# Patient Record
Sex: Male | Born: 1960 | Race: White | Hispanic: No | Marital: Married | Smoking: Former smoker
Health system: Southern US, Community
[De-identification: ages and names within clinical notes are randomized; demographics above are authoritative.]

## PROBLEM LIST (undated history)

## (undated) DIAGNOSIS — F419 Anxiety disorder, unspecified: Secondary | ICD-10-CM

## (undated) DIAGNOSIS — F319 Bipolar disorder, unspecified: Secondary | ICD-10-CM

## (undated) DIAGNOSIS — M545 Low back pain, unspecified: Secondary | ICD-10-CM

## (undated) DIAGNOSIS — J449 Chronic obstructive pulmonary disease, unspecified: Secondary | ICD-10-CM

## (undated) DIAGNOSIS — R7309 Other abnormal glucose: Secondary | ICD-10-CM

## (undated) DIAGNOSIS — M1990 Unspecified osteoarthritis, unspecified site: Secondary | ICD-10-CM

## (undated) DIAGNOSIS — E119 Type 2 diabetes mellitus without complications: Secondary | ICD-10-CM

## (undated) DIAGNOSIS — E785 Hyperlipidemia, unspecified: Secondary | ICD-10-CM

## (undated) DIAGNOSIS — A400 Sepsis due to streptococcus, group A: Secondary | ICD-10-CM

## (undated) DIAGNOSIS — S32009A Unspecified fracture of unspecified lumbar vertebra, initial encounter for closed fracture: Secondary | ICD-10-CM

## (undated) DIAGNOSIS — K219 Gastro-esophageal reflux disease without esophagitis: Secondary | ICD-10-CM

## (undated) DIAGNOSIS — E559 Vitamin D deficiency, unspecified: Secondary | ICD-10-CM

## (undated) DIAGNOSIS — F22 Delusional disorders: Secondary | ICD-10-CM

## (undated) DIAGNOSIS — Z720 Tobacco use: Secondary | ICD-10-CM

## (undated) DIAGNOSIS — Z87442 Personal history of urinary calculi: Secondary | ICD-10-CM

## (undated) DIAGNOSIS — E538 Deficiency of other specified B group vitamins: Secondary | ICD-10-CM

## (undated) DIAGNOSIS — E0590 Thyrotoxicosis, unspecified without thyrotoxic crisis or storm: Secondary | ICD-10-CM

## (undated) DIAGNOSIS — J45909 Unspecified asthma, uncomplicated: Secondary | ICD-10-CM

## (undated) DIAGNOSIS — I10 Essential (primary) hypertension: Secondary | ICD-10-CM

## (undated) DIAGNOSIS — M797 Fibromyalgia: Secondary | ICD-10-CM

## (undated) DIAGNOSIS — J189 Pneumonia, unspecified organism: Secondary | ICD-10-CM

## (undated) HISTORY — PX: TONSILLECTOMY: SUR1361

## (undated) HISTORY — DX: Deficiency of other specified B group vitamins: E53.8

## (undated) HISTORY — DX: Gastro-esophageal reflux disease without esophagitis: K21.9

## (undated) HISTORY — PX: COLONOSCOPY: SHX174

## (undated) HISTORY — DX: Unspecified asthma, uncomplicated: J45.909

## (undated) HISTORY — PX: UMBILICAL HERNIA REPAIR: SHX2598

## (undated) HISTORY — DX: Type 2 diabetes mellitus without complications: E11.9

## (undated) HISTORY — DX: Delusional disorders: F22

## (undated) HISTORY — PX: NOSE SURGERY: SHX723

## (undated) HISTORY — DX: Low back pain: M54.5

## (undated) HISTORY — PX: BACK SURGERY: SHX140

## (undated) HISTORY — DX: Vitamin D deficiency, unspecified: E55.9

## (undated) HISTORY — DX: Hyperlipidemia, unspecified: E78.5

## (undated) HISTORY — DX: Fibromyalgia: M79.7

## (undated) HISTORY — DX: Low back pain, unspecified: M54.50

## (undated) HISTORY — DX: Chronic obstructive pulmonary disease, unspecified: J44.9

## (undated) HISTORY — PX: APPENDECTOMY: SHX54

## (undated) HISTORY — DX: Other abnormal glucose: R73.09

## (undated) HISTORY — PX: CATARACT EXTRACTION: SUR2

## (undated) HISTORY — DX: Sepsis due to Streptococcus, group A: A40.0

## (undated) HISTORY — PX: INGUINAL HERNIA REPAIR: SUR1180

## (undated) HISTORY — DX: Tobacco use: Z72.0

## (undated) HISTORY — DX: Essential (primary) hypertension: I10

## (undated) HISTORY — DX: Bipolar disorder, unspecified: F31.9

---
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
## 1989-08-10 DIAGNOSIS — F319 Bipolar disorder, unspecified: Secondary | ICD-10-CM

## 1989-08-10 HISTORY — DX: Bipolar disorder, unspecified: F31.9

## 2002-07-09 ENCOUNTER — Emergency Department (HOSPITAL_COMMUNITY): Admission: EM | Admit: 2002-07-09 | Discharge: 2002-07-09 | Payer: Self-pay | Admitting: Emergency Medicine

## 2002-10-15 ENCOUNTER — Emergency Department (HOSPITAL_COMMUNITY): Admission: EM | Admit: 2002-10-15 | Discharge: 2002-10-15 | Payer: Self-pay | Admitting: Emergency Medicine

## 2003-07-26 ENCOUNTER — Emergency Department (HOSPITAL_COMMUNITY): Admission: EM | Admit: 2003-07-26 | Discharge: 2003-07-27 | Payer: Self-pay | Admitting: Emergency Medicine

## 2005-01-06 ENCOUNTER — Emergency Department (HOSPITAL_COMMUNITY): Admission: EM | Admit: 2005-01-06 | Discharge: 2005-01-06 | Payer: Self-pay | Admitting: Family Medicine

## 2008-02-19 ENCOUNTER — Emergency Department (HOSPITAL_COMMUNITY): Admission: EM | Admit: 2008-02-19 | Discharge: 2008-02-20 | Payer: Self-pay | Admitting: Emergency Medicine

## 2008-04-05 ENCOUNTER — Encounter: Admission: RE | Admit: 2008-04-05 | Discharge: 2008-04-05 | Payer: Self-pay | Admitting: Orthopedic Surgery

## 2011-04-16 ENCOUNTER — Encounter: Payer: Self-pay | Admitting: Family Medicine

## 2011-04-16 ENCOUNTER — Other Ambulatory Visit: Payer: Self-pay | Admitting: Family Medicine

## 2011-04-17 ENCOUNTER — Encounter: Payer: Self-pay | Admitting: Family Medicine

## 2011-04-17 ENCOUNTER — Ambulatory Visit (INDEPENDENT_AMBULATORY_CARE_PROVIDER_SITE_OTHER): Payer: Self-pay | Admitting: Family Medicine

## 2011-04-17 ENCOUNTER — Other Ambulatory Visit: Payer: Self-pay | Admitting: Family Medicine

## 2011-04-17 DIAGNOSIS — F319 Bipolar disorder, unspecified: Secondary | ICD-10-CM

## 2011-04-17 DIAGNOSIS — I10 Essential (primary) hypertension: Secondary | ICD-10-CM | POA: Insufficient documentation

## 2011-04-17 DIAGNOSIS — F17200 Nicotine dependence, unspecified, uncomplicated: Secondary | ICD-10-CM

## 2011-04-17 DIAGNOSIS — Z720 Tobacco use: Secondary | ICD-10-CM

## 2011-04-17 DIAGNOSIS — R634 Abnormal weight loss: Secondary | ICD-10-CM

## 2011-04-17 DIAGNOSIS — Z87891 Personal history of nicotine dependence: Secondary | ICD-10-CM | POA: Insufficient documentation

## 2011-04-17 NOTE — Assessment & Plan Note (Signed)
Lost 15 pounds in last 2-3 months. Will obtain outside records to confirm weight loss. If true weight loss and continues to lose weight at next visit (1-2 months) then will consider cxr. Will obtain outside records to see if there is a previous cx to compare. Will also need a colonoscopy within the next year.

## 2011-04-17 NOTE — Assessment & Plan Note (Signed)
Encouraged cessation. Praised previous attempts to quit. Patient now up to 2 packs per day.

## 2011-04-17 NOTE — Assessment & Plan Note (Signed)
Moderately well controlled. No red flags. Encouraged increased exercise/lifestyle changes-exercise 4-5x/wk. Will not change lisinopril at this time although it can affect lithium levels. Will change only with discussions with psychiatrist as can change lithium levels.   3. Tobacco Abuse-has attempted to quit multiple times in the past but always goes back  Into smoking when he has a manic episode. Has tried wellbutrin and patches.   4. Chest pain-patient describes chest pain once a month lasting a minute over his left chest. Has been evaluated in the past and told it was a muscle spasm. Events can happen at rest and frequently do. No SOB, Nausea, vomiting, or sweating during these episodes.   5. Weight Loss-lost 15 pounds unintentionally over last 2 to 3 months. We do not have any records concerning whether he has a CXR but we will attempt to obtain records.

## 2011-04-17 NOTE — Patient Instructions (Signed)
It was great to meet you today. I would like to see you back in 1-2 months after we have obtained your medical records. Continue to follow your weight. Continue to try to quit smoking. Increase exercise to 4-5 times per week. Walking with your wife is a wonderful idea.

## 2011-04-17 NOTE — Assessment & Plan Note (Signed)
Will defer management to psychiatrist. Will not discontinue lisinopril on this time as appears to have been on for a while. Will defer management to psychiatrist as changing today would alter Lithium level.

## 2011-04-17 NOTE — Progress Notes (Signed)
  Subjective:    Patient ID: Jesus Martin, male    DOB: 01-Jul-1961, 50 y.o.   MRN: [id number]  HPI Patient is a 51 y/o M with a history of Bipolar Type I, tobacco abuse, and HTN presenting to establish care.   1. Psych-Patient is followed by Dr. Toy Care, psychiatrist. He is on multiple medications including Lamictal, Saphris, Tegretol, and Lithium. Despite this he had a manic episode last 3 days approximately a month ago. His psychiatrist is handling all of his psych meds.   2. HTN-blood pressure slightly elevated today but not in treatment range. Patient denies edema, SOB, HA.   3. Tobacco Abuse-has attempted to quit multiple times in the past but always goes back  Into smoking when he has a manic episode. Has tried wellbutrin and patches.   4. Chest pain-patient describes chest pain once a month lasting a minute over his left chest. Has been evaluated in the past and told it was a muscle spasm. Events can happen at rest and frequently do. No SOB, Nausea, vomiting, or sweating during these episodes.   5. Weight Loss-lost 15 pounds unintentionally over last 2 to 3 months. We do not have any records concerning whether he has a CXR but we will attempt to obtain records.   Past medical, social, family history-reviewed (see history tab) Review of Systems- see HPI     Objective:   Physical Exam  Constitutional: He is oriented to person, place, and time. He appears well-developed.  HENT:  Head: Normocephalic and atraumatic.  Nose: Nose normal.  Mouth/Throat: Oropharynx is clear and moist.  Eyes: Conjunctivae and EOM are normal. Pupils are equal, round, and reactive to light.  Neck: Normal range of motion. Neck supple.  Cardiovascular: Normal rate, regular rhythm, normal heart sounds and intact distal pulses.  Exam reveals no gallop and no friction rub.   No murmur heard. Pulmonary/Chest: Effort normal and breath sounds normal. No respiratory distress. He has no wheezes. He has no rales. He  exhibits no tenderness.  Abdominal: Soft. Bowel sounds are normal. He exhibits no distension. There is no tenderness. There is no rebound and no guarding.  Musculoskeletal: Normal range of motion.  Neurological: He is alert and oriented to person, place, and time. He has normal reflexes.  Skin: Skin is warm and dry.  Psychiatric: He has a normal mood and affect. His behavior is normal.          Assessment & Plan:

## 2011-06-24 ENCOUNTER — Encounter: Payer: Self-pay | Admitting: Family Medicine

## 2011-06-24 ENCOUNTER — Ambulatory Visit (HOSPITAL_COMMUNITY)
Admission: RE | Admit: 2011-06-24 | Discharge: 2011-06-24 | Disposition: A | Discharge status: Home / Self Care | Payer: Self-pay | Source: Ambulatory Visit | Attending: Family Medicine | Admitting: Family Medicine

## 2011-06-24 ENCOUNTER — Ambulatory Visit (INDEPENDENT_AMBULATORY_CARE_PROVIDER_SITE_OTHER): Payer: Self-pay | Admitting: Family Medicine

## 2011-06-24 VITALS — BP 123/72 | HR 91 | Temp 98.0°F | Ht 72.0 in | Wt 227.8 lb

## 2011-06-24 DIAGNOSIS — I10 Essential (primary) hypertension: Secondary | ICD-10-CM

## 2011-06-24 DIAGNOSIS — J984 Other disorders of lung: Secondary | ICD-10-CM | POA: Insufficient documentation

## 2011-06-24 DIAGNOSIS — R634 Abnormal weight loss: Secondary | ICD-10-CM | POA: Insufficient documentation

## 2011-06-24 DIAGNOSIS — J069 Acute upper respiratory infection, unspecified: Secondary | ICD-10-CM

## 2011-06-24 DIAGNOSIS — Z23 Encounter for immunization: Secondary | ICD-10-CM

## 2011-06-24 DIAGNOSIS — Z720 Tobacco use: Secondary | ICD-10-CM

## 2011-06-24 DIAGNOSIS — F17200 Nicotine dependence, unspecified, uncomplicated: Secondary | ICD-10-CM

## 2011-06-24 LAB — POCT URINALYSIS DIPSTICK
Bilirubin, UA: NEGATIVE
Blood, UA: NEGATIVE
Glucose, UA: NEGATIVE
Ketones, UA: NEGATIVE
Leukocytes, UA: NEGATIVE
Nitrite, UA: NEGATIVE
Protein, UA: NEGATIVE
Spec Grav, UA: 1.02
Urobilinogen, UA: 0.2
pH, UA: 7

## 2011-06-24 LAB — COMPREHENSIVE METABOLIC PANEL
ALT: 14 U/L (ref 0–53)
AST: 18 U/L (ref 0–37)
Albumin: 4.8 g/dL (ref 3.5–5.2)
Alkaline Phosphatase: 78 U/L (ref 39–117)
BUN: 9 mg/dL (ref 6–23)
CO2: 29 mEq/L (ref 19–32)
Calcium: 9.8 mg/dL (ref 8.4–10.5)
Chloride: 102 mEq/L (ref 96–112)
Creat: 0.88 mg/dL (ref 0.50–1.35)
Glucose, Bld: 83 mg/dL (ref 70–99)
Potassium: 4.1 mEq/L (ref 3.5–5.3)
Sodium: 139 mEq/L (ref 135–145)
Total Bilirubin: 0.5 mg/dL (ref 0.3–1.2)
Total Protein: 7.1 g/dL (ref 6.0–8.3)

## 2011-06-24 LAB — CBC
HCT: 45.4 % (ref 39.0–52.0)
Hemoglobin: 15.7 g/dL (ref 13.0–17.0)
MCH: 33.4 pg (ref 26.0–34.0)
MCHC: 34.6 g/dL (ref 30.0–36.0)
MCV: 96.6 fL (ref 78.0–100.0)
Platelets: 238 10*3/uL (ref 150–400)
RBC: 4.7 MIL/uL (ref 4.22–5.81)
RDW: 11.5 % (ref 11.5–15.5)
WBC: 10.7 10*3/uL — ABNORMAL HIGH (ref 4.0–10.5)

## 2011-06-24 LAB — PSA: PSA: 0.49 ng/mL (ref <=4.00)

## 2011-06-24 LAB — TSH: TSH: 2.443 u[IU]/mL (ref 0.350–4.500)

## 2011-06-24 NOTE — Progress Notes (Signed)
  Subjective:    Patient ID: Jesus Martin, male    DOB: 1961/08/07, 50 y.o.   MRN: [id number]  HPI Patient is a 50 y/o M with a history of Bipolar Type I, tobacco abuse, and HTN presenting for concerns of weight loss/cancer and for a dry cough for 1-2 weeks.   1. Weight loss-Patient reports a total of 40lbs weight loss in 5 months. He has lost 7 lbs since his visit 2 months ago. He is not intentionally trying to lose weight although he does walk with his grand kids. He is very fearful that this is cancer.  ROS-positive for urinary urgency, hesitancy, and dribbling. Says had a speck of redish brown when urinating earlier with some pain. He says he has been "burning up" without fevers for 4-5 months and has also had looser stools during this time. He is a long term smoker currently at 2 packs per day. Although fearful of cancer, not currently ready to quit even with knowledge that this is the #1 thing he could do to prevent cancer. Interested in a colonoscopy and labs.   2. Tobacco Abuse-has attempted to quit multiple times in the past but always goes back Into smoking when he has a manic episode. Has tried wellbutrin and patches. See problem #1. Says would like to think about it "after the holidays".   3. URI symptoms for 1-2 weeks with low grade temperatures at home to 99.9.  Past medical, social, family history-reviewed (see history tab)    Review of Systemsnegative except as noted in HPI       Objective:   Physical Exam BP 123/72  Pulse 91  Temp(Src) 98 F (36.7 C) (Oral)  Ht 6' (1.829 m)  Wt 103.329 kg (227 lb 12.8 oz)  BMI 30.90 kg/m2 Constitutional: He is oriented to person, place, and time. He appears well-developed.  Nose: Mild Rhinorrhea noted. Mouth/Throat: Oropharynx is clear and moist.  Eyes: Conjunctivae and EOM are normal. Pupils are equal, round, and reactive to light.  Neck: Normal range of motion. Neck supple.  Cardiovascular: Normal rate, regular rhythm, normal  heart sounds and intact distal pulses. Exam reveals no gallop and no friction rub.  No murmur heard.  Pulmonary/Chest: Effort normal and breath sounds normal. No respiratory distress. He has no wheezes. He has no rales. He exhibits no tenderness.  Abdominal: Soft. Bowel sounds are normal. He exhibits no distension. There is no tenderness. There is no rebound and no guarding.      Assessment & Plan:

## 2011-06-24 NOTE — Patient Instructions (Signed)
It was good to see you again today. I know you are worried about your weight loss. We are going to do several tests to try to get to the bottom of why you are losing weight.   1. I would like for you to get a chest x-ray today at the hospital. Just go to the front help desk for assistance in finding radiology.   2. I am going to do several lab tests. I would like to see you back in 3-4 weeks to talk about the results. I will call you sooner than that if there is something we need to talk about.   3. You received a flu shot and tetanus shot today.   4. When you are ready, I want you to make an appointment to meet with our pharmacist Dr. Valentina Lucks for help quitting smoking. You can make that appointment today if you would like.   See you soon, Dr. Yong Channel

## 2011-06-26 DIAGNOSIS — J069 Acute upper respiratory infection, unspecified: Secondary | ICD-10-CM | POA: Insufficient documentation

## 2011-06-26 NOTE — Assessment & Plan Note (Signed)
Well controlled. Will continue current therapy. Encouraged continued walking.

## 2011-06-26 NOTE — Assessment & Plan Note (Addendum)
Checked CBC(given fatigue in last few months), BMET, TSH(given feeling hot, diarrhea), PSA(given new urinary symptoms), and UA. Labs wnl except minimally elevated wbc count (suspect secondary to URI). CXR ordered which showed changes from chronic smoking. Although not read by radiology, slightly more prominent markings in RUL. Given long term history of smoking-will obtain CT chest with contrast. If negative, will schedule patient for colonoscopy.   Patient very worried about cancer but not ready to quit smoking at this time. Still need to see records from [facility] (asked to obtain records again-faxed last time without results). Other possibility-patient has increased activity and is losing weight as a result. No decreased appetite/PO intake from patient.   Follow up in 1 month or sooner depending on results of CT.

## 2011-06-26 NOTE — Assessment & Plan Note (Signed)
Told patient that cold could last for several weeks and is particularly hard to clear in smokers. Red flags discussed.

## 2011-06-26 NOTE — Assessment & Plan Note (Signed)
Spoke with about meeting with Dr. Valentina Lucks. Patient says would like to consider after the holidays. Not ready to quit at this time.

## 2011-07-03 ENCOUNTER — Ambulatory Visit (HOSPITAL_COMMUNITY)
Admission: RE | Admit: 2011-07-03 | Discharge: 2011-07-03 | Disposition: A | Discharge status: Home / Self Care | Payer: Self-pay | Source: Ambulatory Visit | Attending: Family Medicine | Admitting: Family Medicine

## 2011-07-03 DIAGNOSIS — R634 Abnormal weight loss: Secondary | ICD-10-CM | POA: Insufficient documentation

## 2011-07-03 DIAGNOSIS — F17200 Nicotine dependence, unspecified, uncomplicated: Secondary | ICD-10-CM | POA: Insufficient documentation

## 2011-07-03 MED ORDER — IOHEXOL 300 MG/ML  SOLN
100.0000 mL | Freq: Once | INTRAMUSCULAR | Status: AC | PRN
  Administered 2011-07-03: 100 mL via INTRAVENOUS

## 2011-07-10 ENCOUNTER — Encounter: Payer: Self-pay | Admitting: Family Medicine

## 2011-07-17 ENCOUNTER — Encounter: Payer: Self-pay | Admitting: Family Medicine

## 2011-07-17 ENCOUNTER — Ambulatory Visit (INDEPENDENT_AMBULATORY_CARE_PROVIDER_SITE_OTHER): Payer: Self-pay | Admitting: Family Medicine

## 2011-07-17 VITALS — BP 111/73 | HR 76 | Temp 98.3°F | Ht 72.0 in | Wt 228.0 lb

## 2011-07-17 DIAGNOSIS — Z0000 Encounter for general adult medical examination without abnormal findings: Secondary | ICD-10-CM

## 2011-07-17 DIAGNOSIS — F17200 Nicotine dependence, unspecified, uncomplicated: Secondary | ICD-10-CM

## 2011-07-17 DIAGNOSIS — Z720 Tobacco use: Secondary | ICD-10-CM

## 2011-07-17 DIAGNOSIS — R634 Abnormal weight loss: Secondary | ICD-10-CM

## 2011-07-17 DIAGNOSIS — J069 Acute upper respiratory infection, unspecified: Secondary | ICD-10-CM

## 2011-07-17 DIAGNOSIS — I10 Essential (primary) hypertension: Secondary | ICD-10-CM

## 2011-07-17 NOTE — Assessment & Plan Note (Signed)
Weight stable. Will get colonoscopy because patient close to 51 but not because of weight loss. Will continue to monitor. Patient told to come in 1 year or sooner if new concerns.

## 2011-07-17 NOTE — Progress Notes (Signed)
  Subjective:    Patient ID: Jesus Martin, male    DOB: 1960-08-19, 50 y.o.   MRN: [id number]  HPI  Patient is a 50 y/o M with a history of Bipolar Type I, tobacco abuse, and HTN presenting for concerns of weight loss/cancer and sore throat x 1 day.   1. Weight loss-weight up 1 lb per patient since last visit. Reviewed CT scan results which did not show malignancy. He feels much better now that he has not continued to lose weight.  2. Tobacco Abuse-has attempted to quit multiple times in the past but always goes back Into smoking when he has a manic episode. Has tried wellbutrin and patches. See problem #1. Says would like to think about it "after the holidays".   Although still afraid of cancer, not ready/willing to quit smoking yet.   3. Sore throat-reports some congestion (improved overall from last visit). No fevers/chills/myalgias/headaches.     Review of Systems negative except as noted in HPI       Objective:   Physical Exam  BP 111/73  Pulse 76  Temp(Src) 98.3 F (36.8 C) (Oral)  Ht 6' (1.829 m)  Wt 228 lb (103.42 kg)  BMI 30.92 kg/m2 Constitutional: He is oriented to person, place, and time. He appears well-developed.  Mouth/Throat: Oropharynx is clear and moist. Mild erythema of pharynx without pus or exudate.  Neck: Normal range of motion. Neck supple. No lymphadenopathy Cardiovascular: Normal rate, regular rhythm, normal heart sounds and intact distal pulses. Exam reveals no gallop and no friction rub.  No murmur heard.  Pulmonary/Chest: Effort normal and breath sounds normal. No respiratory distress. He has no wheezes. He has no rales. He exhibits no tenderness.  Extremities-no edema. Moves x4.  Neuro: alert and oriented x3.      Assessment & Plan:

## 2011-07-17 NOTE — Assessment & Plan Note (Signed)
Well controlled on current meds. Encouraged continued lifestyle changes. Reminded that weight loss from exercise/proper food choices is healthy.

## 2011-07-17 NOTE — Assessment & Plan Note (Signed)
New onset since last visit. Slight sore throat. Todl time is best remedy. Also advised of other symptomatic treatment.

## 2011-07-17 NOTE — Patient Instructions (Signed)
It was good to see you again today, Jesus Martin,   To review: 1. Your CT scan was normal except for chronic smoking changes.  2. I want you to continue to consider quitting smoking. When you are ready, please let us know. We have a number of resources to help.  3. I am going to try to get you set up for a colonoscopy through a referral.  4. I am glad you have not had any more drastic weight loss. It is reasonable for you to eat a balanced diet though and continue to exercise. If you lose weight this way, that is a good thing.   Please come back in 1 year or sooner as needed,  Dr. Yong Channel  P.S. We will keep an eye out for your records coming in.

## 2011-07-17 NOTE — Assessment & Plan Note (Signed)
Not ready to quit. Discussed options like Dr. Valentina Lucks and Dr. Gwenlyn Saran. Patient aware that we will address at every visit.

## 2011-09-07 ENCOUNTER — Encounter: Payer: Self-pay | Admitting: Family Medicine

## 2011-09-07 ENCOUNTER — Ambulatory Visit (INDEPENDENT_AMBULATORY_CARE_PROVIDER_SITE_OTHER): Payer: Self-pay | Admitting: Family Medicine

## 2011-09-07 VITALS — BP 118/90 | HR 84 | Temp 97.7°F | Ht 72.0 in | Wt 225.6 lb

## 2011-09-07 DIAGNOSIS — Z720 Tobacco use: Secondary | ICD-10-CM

## 2011-09-07 DIAGNOSIS — R634 Abnormal weight loss: Secondary | ICD-10-CM

## 2011-09-07 DIAGNOSIS — I10 Essential (primary) hypertension: Secondary | ICD-10-CM

## 2011-09-07 DIAGNOSIS — F319 Bipolar disorder, unspecified: Secondary | ICD-10-CM

## 2011-09-07 DIAGNOSIS — F17200 Nicotine dependence, unspecified, uncomplicated: Secondary | ICD-10-CM

## 2011-09-07 DIAGNOSIS — Z79899 Other long term (current) drug therapy: Secondary | ICD-10-CM

## 2011-09-07 LAB — POCT GLYCOSYLATED HEMOGLOBIN (HGB A1C): Hemoglobin A1C: 5.4

## 2011-09-07 LAB — LDL CHOLESTEROL, DIRECT: Direct LDL: 98 mg/dL

## 2011-09-07 NOTE — Patient Instructions (Signed)
To review: 1. Congratulations on your desire to quit smoking. I know you have quit before and I believe in you that you can do it again.  2. We are going to help you out with some resources  A. Call 1-800-quit-now and they may be able to help you with nicotine replacement  B. I want you to get an appointment at the front to meet with Dr. Valentina Lucks about quitting smoking 3. Until you meet with Dr. Valentina Lucks, I want you to do the following:  A. Try deep breathing for 10 seconds when you get upset to try to avoid smoking  B. When you get bored, try to do some woodworking since boredom seems to trigger your smoking.   C. If at all possible, try to cut down to 1 and 1/2 pack of cigarettes per day.  4. I am going to get some lab work today to check your cholesterol and to see if you are at risk for diabetes.  5. We are still trying to get you set up for a colonoscopy. Unfortunately, there are not any doctors accepting patient's without insurance at this time. We will let you know if this changes.   Thanks, Dr. Yong Channel

## 2011-09-07 NOTE — Assessment & Plan Note (Signed)
Discussed triggers and goals with patient. Patient to set up appointment with Dr. Valentina Lucks. Patient will attempt to cut down 1 1/2 PPD until sees Dr. Valentina Lucks by using deep breathing and trying woodworking while bored. Please see AVS for additional information.

## 2011-09-07 NOTE — Assessment & Plan Note (Signed)
Weight stable today only down 2 lbs from early November. . Patient less concerned about this issue. Still trying to have patient set up for colonoscopy, but no GI MD accepting indigent at this time.

## 2011-09-07 NOTE — Assessment & Plan Note (Signed)
Defer to psychiatry. Patient appears to be well controlled at this time.

## 2011-09-07 NOTE — Assessment & Plan Note (Addendum)
Well controlled. No current side effects. Continue current medications.   As patient is smoker and hypertensive, will get LDL and a1c to risk stratify. Likely would benefit from statin.

## 2011-09-07 NOTE — Progress Notes (Signed)
  Subjective:    Patient ID: Jesus Martin, male    DOB: September 19, 1960, 51 y.o.   MRN: [id number]  HPI Patient is a 51 y/o M with a history of Bipolar Type I, tobacco abuse, and HTN presenting with desire to quit smoking!  1. Tobacco Abuse-has attempted to quit multiple times in the past but always goes back Into smoking when he has a manic episode. Has tried wellbutrin and patches. He says nothing seems to work. He is frustrated that he hasn't quit. He says he used to be able to walk 5 miles and can only walk about a mile now. He smokes [employer] of [employer]. Says he wants to be around for his grandchildren  Triggers-getting angry which he says happens a lot because he is bipolar, boredom.  Goals-wants to be able to walk further without being SOB. Wants to be around for grandchildren.   2. HTN-denies CP (except for once monthly sharp pain that can happen at rest resolved within a minute), HA, SOB (except per problem #1 related to smoking), blurry vision, LE edema, orthopnea (says only sleeps on side), PND. Well controlled on current medications without side effects.   3. Weight loss-patietn seen previously for this issue. Weight stable today. Only down 2 lbs from early November. Patient not concerned as previous about smoking but knows quitting smoking could reduce risk of cancer.   Review of Systems negative except as noted in HPI       Objective:   Physical Exam  Vitals reviewed. Constitutional: He is oriented to person, place, and time. He appears well-developed and well-nourished.  HENT:  Head: Normocephalic and atraumatic.  Mouth/Throat: Oropharynx is clear and moist. No oropharyngeal exudate.  Neck: Normal range of motion. Neck supple. No thyromegaly present.  Cardiovascular: Normal rate and regular rhythm.  Exam reveals no gallop and no friction rub.   No murmur heard. Pulmonary/Chest: Effort normal and breath sounds normal. No respiratory distress. He has no wheezes. He has no rales.   Abdominal: Soft. Bowel sounds are normal. He exhibits no distension. There is no tenderness.  Musculoskeletal: Normal range of motion. He exhibits no edema.  Neurological: He is alert and oriented to person, place, and time.          Assessment & Plan:

## 2011-09-10 ENCOUNTER — Encounter: Payer: Self-pay | Admitting: Family Medicine

## 2011-09-15 ENCOUNTER — Encounter: Payer: Self-pay | Admitting: Pharmacist

## 2011-09-15 ENCOUNTER — Ambulatory Visit (INDEPENDENT_AMBULATORY_CARE_PROVIDER_SITE_OTHER): Payer: Self-pay | Admitting: Pharmacist

## 2011-09-15 VITALS — BP 125/83 | HR 75 | Ht 72.0 in | Wt 226.0 lb

## 2011-09-15 DIAGNOSIS — Z720 Tobacco use: Secondary | ICD-10-CM

## 2011-09-15 DIAGNOSIS — F17200 Nicotine dependence, unspecified, uncomplicated: Secondary | ICD-10-CM

## 2011-09-15 NOTE — Progress Notes (Signed)
  Subjective:    Patient ID: Jesus Martin, male    DOB: 1961/02/18, 51 y.o.   MRN: [id number]  HPI  Patient arrives with depressed mood accompanied by his 64 year old  granddaughter Jesus Martin.  He states he is interested in quitting cold [place].   He has attempted to quit in the past without significant success.   Number of Cigarettes per day 40. Brand smoked [employer]. Estimated Nicotine Content per Cigarette (mg) 1.2.  Estimated Nicotine intake per day >60m.   Smokes first cigarette < 5 minutes after waking.  Longest time ever been tobacco free 3 months.  What Medications (NRT, bupropion, varenicline) used in past includes Bupropion, patch and nicotine gum.    Rates IMPORTANCE of quitting tobacco on 1-10 scale of 8 . Rates READINESS of quitting tobacco on 1-10 scale of 4. Rates CONFIDENCE of quitting tobacco on 1-10 scale of 8. Triggers to use tobacco include; Boredom, HABIT - depressed mood     Review of Systems     Objective:   Physical Exam        Assessment & Plan:  Severe  Nicotine Dependence of many years duration in a patient who is fair candidate for success b/c of current level of motivation BUT struggles with mental illness.   Considering ECT for depressed symptoms.  Return to clinic on Friday to initiate nicotine replacement tx in combination.  Written information provided. F/U Rx Clinic Visit 09/18/2011.   Total time in face-to-face counseling 25 minutes.

## 2011-09-15 NOTE — Assessment & Plan Note (Signed)
Severe  Nicotine Dependence of many years duration in a patient who is fair candidate for success b/c of current level of motivation BUT struggles with mental illness.   Considering ECT for depressed symptoms.  Return to clinic on Friday to initiate nicotine replacement tx in combination.  Written information provided. F/U Rx Clinic Visit 09/18/2011.   Total time in face-to-face counseling 25 minutes.

## 2011-09-15 NOTE — Patient Instructions (Addendum)
Follow Up with pharmacist clinic.   Friday AM at 10AM

## 2011-09-15 NOTE — Progress Notes (Signed)
  Subjective:    Patient ID: Jesus Martin, male    DOB: 1961/07/05, 51 y.o.   MRN: [id number]  HPI Reviewed and agree with Dr. Graylin Shiver management.    Review of Systems     Objective:   Physical Exam        Assessment & Plan:

## 2011-09-18 ENCOUNTER — Encounter: Payer: Self-pay | Admitting: Pharmacist

## 2011-09-18 ENCOUNTER — Ambulatory Visit (INDEPENDENT_AMBULATORY_CARE_PROVIDER_SITE_OTHER): Payer: Self-pay | Admitting: Pharmacist

## 2011-09-18 DIAGNOSIS — F17200 Nicotine dependence, unspecified, uncomplicated: Secondary | ICD-10-CM

## 2011-09-18 DIAGNOSIS — Z720 Tobacco use: Secondary | ICD-10-CM

## 2011-09-18 MED ORDER — NICOTINE 21 MG/24HR TD PT24: 1.0000 | MEDICATED_PATCH | TRANSDERMAL | Status: DC

## 2011-09-18 NOTE — Progress Notes (Signed)
  Subjective:    Patient ID: Jesus Martin, male    DOB: 11-27-1960, 51 y.o.   MRN: [id number]  HPI  Patient arrives with depressed mood accompanied by his 3 year old granddaughter Abigail. He is interested in quitting smoking.  He has attempted to quit in the past without significant success.  Number of Cigarettes per day 40.  Brand smoked [employer]. Estimated Nicotine Content per Cigarette (mg) 1.2.  Estimated Nicotine intake per day >28m.  Smokes first cigarette < 5 minutes after waking.  Longest time ever been tobacco free 3 months.  What Medications (NRT, bupropion, varenicline) used in past includes Bupropion, patch and nicotine gum.  Rates IMPORTANCE of quitting tobacco on 1-10 scale of 8 .  Rates READINESS of quitting tobacco on 1-10 scale of 4.  Rates CONFIDENCE of quitting tobacco on 1-10 scale of 8.  Triggers to use tobacco include; Boredom, HABIT - depressed mood  He and his wife have NOT yet made any plans RE possible ECT therapy for his depressed mood.      Review of Systems     Objective:   Physical Exam        Assessment & Plan:  severe Nicotine Dependence of longstanding duration in a patient who is fair candidate for success b/c of current level of motivation.  His current depressed mood and potential ECT Tx may impact his success.  Initiated combination nicotine replacement tx with 2683m patch (insurance to cover) and 83m70mozenge (patient will purchase). Patient counseled on purpose, proper use, and potential adverse effects, including   Written information provided.  F/U Rx Clinic Visit in one week.   Total time in face-to-face counseling 60 minutes.  Patient seen with KriWoodroe Chenharmacy Resident.

## 2011-09-18 NOTE — Progress Notes (Signed)
  Subjective:    Patient ID: Jesus Martin, male    DOB: [birth year], 51 y.o.   MRN: [id number]  HPI  Reviewed and agree with Dr. Graylin Shiver management.     Review of Systems     Objective:   Physical Exam        Assessment & Plan:

## 2011-09-18 NOTE — Assessment & Plan Note (Signed)
severe Nicotine Dependence of longstanding duration in a patient who is fair candidate for success b/c of current level of motivation.  His current depressed mood and potential ECT Tx may impact his success.  Initiated combination nicotine replacement tx with 60m  patch (insurance to cover) and 454mlozenge (patient will purchase). Patient counseled on purpose, proper use, and potential adverse effects, including   Written information provided.  F/U Rx Clinic Visit in one week.   Total time in face-to-face counseling 60 minutes.  Patient seen with KrWoodroe ChenPharmacy Resident.

## 2011-09-18 NOTE — Patient Instructions (Signed)
Quit Date will be 09/21/2011  Think about things that can keep you busy on Monday! Try to avoid you after lunch cigarette for the next few days - it will make it easier on your quit date.  Use a 21 mg patch daily  Buy 39m gum and start 8-10 pieces per day on your quit date.

## 2011-09-25 ENCOUNTER — Ambulatory Visit (INDEPENDENT_AMBULATORY_CARE_PROVIDER_SITE_OTHER): Payer: Self-pay | Admitting: Pharmacist

## 2011-09-25 ENCOUNTER — Encounter: Payer: Self-pay | Admitting: Pharmacist

## 2011-09-25 DIAGNOSIS — Z720 Tobacco use: Secondary | ICD-10-CM

## 2011-09-25 DIAGNOSIS — F17200 Nicotine dependence, unspecified, uncomplicated: Secondary | ICD-10-CM
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
## 2011-09-25 NOTE — Progress Notes (Signed)
  Subjective:    Patient ID: Jesus Martin, male    DOB: May 04, 1961, 51 y.o.   MRN: [id number]  HPI Reviewed and agree with Dr. Graylin Shiver management    Review of Systems     Objective:   Physical Exam        Assessment & Plan:

## 2011-09-25 NOTE — Patient Instructions (Signed)
Congratulations on Quitting Smoking on 09/21/2011 at 11:00 AM!  Continue to use the 21 mg patch AND the 72m nicotine gum for the next few weeks.

## 2011-09-25 NOTE — Assessment & Plan Note (Signed)
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   Continue nicotine replacement tx of gum and patch at this time. Patient counseled on purpose, proper use.   Patient denies significant adverse effects of tx. Written information provided. P F/U Rx Clinic Visit 3/8   Total time in face-to-face counseling 35 minutes.

## 2011-10-16 ENCOUNTER — Ambulatory Visit (INDEPENDENT_AMBULATORY_CARE_PROVIDER_SITE_OTHER): Payer: Self-pay | Admitting: Pharmacist

## 2011-10-16 ENCOUNTER — Encounter: Payer: Self-pay | Admitting: Pharmacist

## 2011-10-16 VITALS — BP 141/89 | HR 79 | Ht 72.0 in | Wt 232.0 lb

## 2011-10-16 DIAGNOSIS — F17200 Nicotine dependence, unspecified, uncomplicated: Secondary | ICD-10-CM

## 2011-10-16 DIAGNOSIS — Z720 Tobacco use: Secondary | ICD-10-CM

## 2011-10-16 MED ORDER — NICOTINE 14 MG/24HR TD PT24: 1.0000 | MEDICATED_PATCH | TRANSDERMAL | Status: DC

## 2011-10-16 NOTE — Progress Notes (Signed)
  Subjective:    Patient ID: Jesus Martin, male    DOB: Apr 20, 1961, 51 y.o.   MRN: [id number]  HPI Patient arrived in good spirits. He reports not using cigarettes x 3 weeks - using 21 mg nicotine patch and 4 mg nicotine gum approximately 8x per day.  His biggest challenge is when friends visit that smoke, and he allows them to smoke outside of the house. He copes with this by chewing nicotine gum. He expresses desire to taper down on patch.    Review of Systems     Objective:   Physical Exam        Assessment & Plan:  severe Nicotine Dependence of many years duration in a patient who is good candidate for success b/c of motivation for success and x 3 weeks.   Patient denies any adverse effects of patch or gum.   Continue nicotine replacement tx. Taper nicotine patch to 14 mg, continue 4 mg gum up to 8 pieces per day. Patient counseled on purpose, proper use, and potential adverse effects, including.  F/U clinic visit in 3 weeks on 3/28.   Total time in face-to-face counseling 15 minutes.  Patient seen with Liane Comber, PharmD Candidate and Martinique Smith, Pharmacy Resident.

## 2011-10-16 NOTE — Patient Instructions (Addendum)
1. Change nicotine patch to 14 mg per patch 2. Continue to use same use of nicotine gum  3. Return for follow up appointment March 28th at 8:30 am

## 2011-10-16 NOTE — Assessment & Plan Note (Signed)
severe Nicotine Dependence of many years duration in a patient who is good candidate for success b/c of motivation for success and x 3 weeks.   Patient denies any adverse effects of patch or gum.   Continue nicotine replacement tx. Taper nicotine patch to 14 mg, continue 4 mg gum up to 8 pieces per day. Patient counseled on purpose, proper use, and potential adverse effects, including.  F/U clinic visit in 3 weeks on 3/28.   Total time in face-to-face counseling 15 minutes.  Patient seen with Liane Comber, PharmD Candidate and Martinique Smith, Pharmacy Resident.

## 2011-10-16 NOTE — Progress Notes (Signed)
  Subjective:    Patient ID: Jesus Martin, male    DOB: [birth year], 51 y.o.   MRN: [id number]  HPI  Reviewed agree with Dr. Graylin Shiver management.    Review of Systems     Objective:   Physical Exam        Assessment & Plan:

## 2011-11-05 ENCOUNTER — Ambulatory Visit: Payer: Self-pay | Admitting: Pharmacist

## 2011-11-23 ENCOUNTER — Telehealth: Payer: Self-pay | Admitting: Pharmacist

## 2011-11-23 ENCOUNTER — Ambulatory Visit: Payer: Self-pay | Admitting: Pharmacist

## 2011-11-23 NOTE — Telephone Encounter (Signed)
Patient not at home.  Person answering phone will have him reschedule in MAY.    No change suggested at this time.

## 2013-04-23 ENCOUNTER — Emergency Department (HOSPITAL_COMMUNITY)
Admission: EM | Admit: 2013-04-23 | Discharge: 2013-04-23 | Disposition: A | Discharge status: Home / Self Care | Payer: Medicare HMO | Attending: Emergency Medicine | Admitting: Emergency Medicine

## 2013-04-23 ENCOUNTER — Encounter (HOSPITAL_COMMUNITY): Payer: Self-pay | Admitting: Family Medicine

## 2013-04-23 DIAGNOSIS — Z79899 Other long term (current) drug therapy: Secondary | ICD-10-CM | POA: Insufficient documentation

## 2013-04-23 DIAGNOSIS — Z87891 Personal history of nicotine dependence: Secondary | ICD-10-CM | POA: Insufficient documentation

## 2013-04-23 DIAGNOSIS — I10 Essential (primary) hypertension: Secondary | ICD-10-CM | POA: Insufficient documentation

## 2013-04-23 DIAGNOSIS — K089 Disorder of teeth and supporting structures, unspecified: Secondary | ICD-10-CM | POA: Insufficient documentation

## 2013-04-23 DIAGNOSIS — K0889 Other specified disorders of teeth and supporting structures: Secondary | ICD-10-CM

## 2013-04-23 DIAGNOSIS — R110 Nausea: Secondary | ICD-10-CM | POA: Insufficient documentation

## 2013-04-23 DIAGNOSIS — F319 Bipolar disorder, unspecified: Secondary | ICD-10-CM | POA: Insufficient documentation

## 2013-04-23 MED ORDER — PENICILLIN V POTASSIUM 500 MG PO TABS: 500.0000 mg | ORAL_TABLET | Freq: Four times a day (QID) | ORAL | Status: AC

## 2013-04-23 MED ORDER — HYDROCODONE-ACETAMINOPHEN 5-325 MG PO TABS: 1.0000 | ORAL_TABLET | ORAL | Status: DC | PRN

## 2013-04-23 NOTE — ED Provider Notes (Signed)
CSN: [id number]     Arrival date & time 04/23/13  1049 History  This chart was scribed for non-physician practitioner Vernie Murders, PA-C, working with Ezequiel Essex, MD by Zettie Pho, ED Scribe. This patient was seen in room TR08C/TR08C and the patient's care was started at 11:28 AM.    Chief Complaint  Patient presents with  . Dental Pain   The history is provided by the patient. No language interpreter was used.   HPI Comments: Jesus Martin is a 52 y.o. male with a PMH of HTN, bipolar disorder and tobacco abuse who presents to the Emergency Department complaining of dental pain.  Dental pain is located in the upper and lower right-sided back molars.  Pain began 2 days ago and is new for him. He reports feeling a small mass around the upper right side of his cheek.  He denies any drainage from the area. He reports some associated nausea with no emesis.  Patient reports taking Ibuprofen at home without relief. Patient states that he has a dentist, but states that the pain was so severe and that he could not wait to make an appointment. He denies fever, chills, vomiting, difficulty breathing/swallowing, or any other symptoms at this time. Patient is a current smoker.   Past Medical History  Diagnosis Date  . Bipolar I disorder     diagnosed age 33   . HTN (hypertension)   . Tobacco abuse    Past Surgical History  Procedure Laterality Date  . Appendectomy      1975  . Back surgery      1998   Family History  Problem Relation Age of Onset  . Adopted: Yes   History  Substance Use Topics  . Smoking status: Former Smoker -- 2.00 packs/day for 32 years    Types: Cigarettes  . Smokeless tobacco: Not on file     Comment: quit 09/2011  . Alcohol Use: 0.6 oz/week    1 Glasses of wine per week    Review of Systems  Constitutional: Negative for fever and chills.  HENT: Positive for dental problem. Negative for sore throat, drooling, mouth sores, trouble swallowing and voice  change.   Respiratory: Negative for shortness of breath.   Gastrointestinal: Positive for nausea. Negative for vomiting.  All other systems reviewed and are negative.    Allergies  Review of patient's allergies indicates no known allergies.  Home Medications   Current Outpatient Rx  Name  Route  Sig  Dispense  Refill  . amLODipine (NORVASC) 5 MG tablet   Oral   Take 5 mg by mouth daily.           Marland Kitchen asenapine (SAPHRIS) 5 MG SUBL   Sublingual   Place 10 mg under the tongue daily.          . carbamazepine (TEGRETOL) 200 MG tablet   Oral   Take 200 mg by mouth 2 (two) times daily.           Marland Kitchen lamoTRIgine (LAMICTAL) 200 MG tablet   Oral   Take 200 mg by mouth 2 (two) times daily. 2 times per day         . lisinopril-hydrochlorothiazide (PRINZIDE,ZESTORETIC) 20-12.5 MG per tablet   Oral   Take 1 tablet by mouth daily.           Marland Kitchen lithium carbonate (ESKALITH) 450 MG CR tablet   Oral   Take 450 mg by mouth 2 (two) times daily. 1-2x per  day           . LORazepam (ATIVAN) 1 MG tablet   Oral   Take 1 mg by mouth every 8 (eight) hours as needed.           . nicotine (NICODERM CQ - DOSED IN MG/24 HOURS) 14 mg/24hr patch   Transdermal   Place 1 patch onto the skin daily.   28 patch   0   . nicotine polacrilex (NICORETTE) 4 MG gum   Oral   Take 1 each (4 mg total) by mouth as needed for smoking cessation (Use as needed for cravings - use up to 10 pieces per day. ).   100 tablet   3    Triage Vitals: BP 130/78  Pulse 73  Temp(Src) 97 F (36.1 C)  Resp 18  SpO2 100%  Filed Vitals:   04/23/13 1058  BP: 130/78  Pulse: 73  Temp: 97 F (36.1 C)  Resp: 18  SpO2: 100%    Physical Exam  Nursing note and vitals reviewed. Constitutional: He is oriented to person, place, and time. He appears well-developed and well-nourished. No distress.  HENT:  Head: Normocephalic and atraumatic.  Right Ear: External ear normal.  Left Ear: External ear normal.   Nose: Nose normal.  Mouth/Throat: Oropharynx is clear and moist.  Significant dental decay noted of the right upper and lower back molars extending down to the base of the gums. Poor dentition throughout.  No dental masses or abscess noted. No trismus. Uvula midline.  No palpable masses to the soft tissues of the face throughout.   Eyes: Conjunctivae are normal. Right eye exhibits no discharge. Left eye exhibits no discharge.  Neck: Normal range of motion. Neck supple.  No cervical lymphadenopathy noted. No anterior cervical tenderness to palpation. No edema to the jaw or neck throughout.    Cardiovascular: Normal rate, regular rhythm and normal heart sounds.  Exam reveals no gallop and no friction rub.   No murmur heard. Pulmonary/Chest: Effort normal and breath sounds normal. No respiratory distress.  Musculoskeletal: Normal range of motion.  Neurological: He is alert and oriented to person, place, and time.  Skin: Skin is warm and dry. He is not diaphoretic.  Psychiatric: He has a normal mood and affect. His behavior is normal.    ED Course  Procedures (including critical care time)  DIAGNOSTIC STUDIES: Oxygen Saturation is 100% on room air, normal by my interpretation.    COORDINATION OF CARE: 11:32AM- Will discharge patient with Veetid to treat possible infection and Vicodin for pain management. Advised patient to follow up with a dentist tomorrow and discussed that his teeth will likely require surgical removal. Advised patient to quit smoking. Discussed treatment plan with patient at bedside and patient verbalized agreement.    Labs Review Labs Reviewed - No data to display Imaging Review No results found.  MDM   1. Pain, dental     HUDSON MAJKOWSKI is a 52 y.o. male with a PMH of HTN, bipolar disorder and tobacco abuse who presents to the Emergency Department complaining of dental pain.     Etiology of dental pain is likely due to dental caries.  No evidence of dental  abscess at this time.  Patient non-toxic in appearance and afebrile in the ED.  Patient was prescribed Norco and Penicillin for outpatient management.  Patient has a dentist who he will follow-up with tomorrow.  Patient was instructed to return to the ED if they experience any  difficulty swallowing/breathing, fever, or other concerns.  Patient was in agreement with discharge and plan.     Final impressions: 1. Dental pain     Denman George    I personally performed the services described in this documentation, which was scribed in my presence. The recorded information has been reviewed and is accurate.   Lucila Maine, PA-C 04/25/13 2139

## 2013-04-23 NOTE — ED Notes (Signed)
Pt complaining of dental pain on the left side. sts since Friday.

## 2013-04-25 NOTE — ED Provider Notes (Signed)
Medical screening examination/treatment/procedure(s) were performed by non-physician practitioner and as supervising physician I was immediately available for consultation/collaboration.   Ezequiel Essex, MD 04/25/13 2141

## 2014-09-28 DIAGNOSIS — F17200 Nicotine dependence, unspecified, uncomplicated: Secondary | ICD-10-CM | POA: Diagnosis not present

## 2014-09-28 DIAGNOSIS — R251 Tremor, unspecified: Secondary | ICD-10-CM | POA: Diagnosis not present

## 2014-09-28 DIAGNOSIS — E785 Hyperlipidemia, unspecified: Secondary | ICD-10-CM | POA: Diagnosis not present

## 2014-09-28 DIAGNOSIS — Z79899 Other long term (current) drug therapy: Secondary | ICD-10-CM | POA: Diagnosis not present

## 2014-09-28 DIAGNOSIS — R630 Anorexia: Secondary | ICD-10-CM | POA: Diagnosis not present

## 2014-09-28 DIAGNOSIS — R110 Nausea: Secondary | ICD-10-CM | POA: Diagnosis not present

## 2014-09-28 DIAGNOSIS — I10 Essential (primary) hypertension: Secondary | ICD-10-CM | POA: Diagnosis not present

## 2014-09-28 DIAGNOSIS — F39 Unspecified mood [affective] disorder: Secondary | ICD-10-CM | POA: Diagnosis not present

## 2014-09-28 DIAGNOSIS — J019 Acute sinusitis, unspecified: Secondary | ICD-10-CM | POA: Diagnosis not present

## 2014-10-23 ENCOUNTER — Other Ambulatory Visit: Payer: Self-pay | Admitting: Family Medicine

## 2014-10-23 ENCOUNTER — Ambulatory Visit
Admission: RE | Admit: 2014-10-23 | Discharge: 2014-10-23 | Disposition: A | Discharge status: Home / Self Care | Payer: Medicare HMO | Source: Ambulatory Visit | Attending: Family Medicine | Admitting: Family Medicine

## 2014-10-23 DIAGNOSIS — F17200 Nicotine dependence, unspecified, uncomplicated: Secondary | ICD-10-CM

## 2014-10-23 DIAGNOSIS — R05 Cough: Secondary | ICD-10-CM

## 2014-10-23 DIAGNOSIS — R059 Cough, unspecified: Secondary | ICD-10-CM

## 2014-11-06 DIAGNOSIS — J019 Acute sinusitis, unspecified: Secondary | ICD-10-CM | POA: Diagnosis not present

## 2014-11-06 DIAGNOSIS — R51 Headache: Secondary | ICD-10-CM | POA: Diagnosis not present

## 2014-11-06 DIAGNOSIS — R42 Dizziness and giddiness: Secondary | ICD-10-CM | POA: Diagnosis not present

## 2014-11-06 DIAGNOSIS — R251 Tremor, unspecified: Secondary | ICD-10-CM | POA: Diagnosis not present

## 2014-11-06 DIAGNOSIS — G4719 Other hypersomnia: Secondary | ICD-10-CM | POA: Diagnosis not present

## 2014-11-15 ENCOUNTER — Ambulatory Visit (INDEPENDENT_AMBULATORY_CARE_PROVIDER_SITE_OTHER): Payer: Commercial Managed Care - HMO | Admitting: Neurology

## 2014-11-15 ENCOUNTER — Encounter: Payer: Self-pay | Admitting: Neurology

## 2014-11-15 VITALS — BP 132/70 | HR 92 | Ht 71.0 in | Wt 241.0 lb

## 2014-11-15 DIAGNOSIS — R279 Unspecified lack of coordination: Secondary | ICD-10-CM

## 2014-11-15 DIAGNOSIS — G9340 Encephalopathy, unspecified: Secondary | ICD-10-CM

## 2014-11-15 DIAGNOSIS — G4710 Hypersomnia, unspecified: Secondary | ICD-10-CM

## 2014-11-15 DIAGNOSIS — R251 Tremor, unspecified: Secondary | ICD-10-CM

## 2014-11-15 DIAGNOSIS — R278 Other lack of coordination: Secondary | ICD-10-CM

## 2014-11-15 NOTE — Patient Instructions (Addendum)
1. Your provider has requested that you have labwork completed today. Please go to [facility] on the first floor of this building before leaving the office today. 2. We have scheduled you at [facility] for your MRI Brain and your Ultrasound Liver on 12/06/2013 at 8:00 am. Please arrive 15 minutes prior and go to 1st floor radiology. If you need to reschedule for any reason please call [phone]. Please do not eat or drink anything after midnight the night prior.

## 2014-11-15 NOTE — Progress Notes (Signed)
Subjective:   Jesus Martin was seen in consultation in the movement disorder clinic at the request of FULP, CAMMIE, MD.  The evaluation is for tremor.  The records that were made available to me were reviewed.    The patient is a 54 y.o. right handed male with a history of tremor.  Pt reports tremor has been going on for 6 months.  Tremor is in the legs and the hands.  It is getting worse with time.  He notices it most in the AM.    He is on Lithium (450 mg bid) and has been on that medication for 1.5 years.  He had a normal lithium level of 0.6 on 11/06/14.  He is also on seroquel 800 mg daily qhs.  He has been on this for a year.  There is no family hx of tremor.    Affected by caffeine:  No.  (was drinking 2 pots of coffee per day but down to 2 cups per day) Affected by alcohol: unknown, doesn't drink Affected by stress:  unknown Affected by fatigue:  No. Spills soup if on spoon:  Yes.  , states that I "throw my food and the fork too." Spills glass of liquid if full:  Yes.   Affects ADL's (tying shoes, brushing teeth, etc):  No.  (and shaving with real blade)   The patients referral also mentions excessive daytime hypersomnolence although neither the patient nor his wife mentions this during the visit.  He is on multiple medications that could contribute to this.  Besides from the lithium and Seroquel, he is on Lyrica, 300 mg twice a day, Tegretol 400 mg in the AM/ 600 mg at night, Ativan 1 mg (takes it prn - took 2 before coming to the office today as doesn't like to leave the house), Lamictal 200 mg twice a day.  His psychiatrist is Dr. Toy Care.    Outside reports reviewed: historical medical records and lab reports.  No Known Allergies  Outpatient Encounter Prescriptions as of 11/15/2014  Medication Sig  . amLODipine (NORVASC) 5 MG tablet Take 5 mg by mouth daily.    . carbamazepine (TEGRETOL) 200 MG tablet Take by mouth. 400 mg in the morning, 600 mg in the evening  .  cyanocobalamin 100 MCG tablet Take 100 mcg by mouth daily.  Marland Kitchen ibuprofen (ADVIL,MOTRIN) 200 MG tablet Take 200 mg by mouth every 6 (six) hours as needed for pain.  Marland Kitchen lamoTRIgine (LAMICTAL) 200 MG tablet Take 200 mg by mouth daily. 2 times per day  . lisinopril-hydrochlorothiazide (PRINZIDE,ZESTORETIC) 20-12.5 MG per tablet Take 1 tablet by mouth daily.    Marland Kitchen lithium carbonate (ESKALITH) 450 MG CR tablet Take 450 mg by mouth 2 (two) times daily. 1-2x per day    . LORazepam (ATIVAN) 1 MG tablet Take 1 mg by mouth every 8 (eight) hours as needed.    Marland Kitchen omeprazole (PRILOSEC) 40 MG capsule Take 40 mg by mouth daily.  . pregabalin (LYRICA) 300 MG capsule Take 300 mg by mouth 2 (two) times daily.  . QUEtiapine (SEROQUEL) 400 MG tablet Take 800 mg by mouth at bedtime.  . [DISCONTINUED] QUEtiapine (SEROQUEL) 200 MG tablet Take 200 mg by mouth at bedtime.  . [DISCONTINUED] HYDROcodone-acetaminophen (NORCO/VICODIN) 5-325 MG per tablet Take 1-2 tablets by mouth every 4 (four) hours as needed for pain.    Past Medical History  Diagnosis Date  . Bipolar I disorder     diagnosed age 33   .  HTN (hypertension)   . Tobacco abuse   . Fibromyalgia     Past Surgical History  Procedure Laterality Date  . Appendectomy      1975  . Back surgery      1998  . Nose surgery      History   Social History  . Marital Status: Married    Spouse Name: N/A  . Number of Children: N/A  . Years of Education: N/A   Occupational History  . Not on file.   Social History Main Topics  . Smoking status: Current Every Day Smoker -- 1.00 packs/day for 32 years    Types: Cigarettes  . Smokeless tobacco: Not on file     Comment: 1 pack a day with nicotine patch  . Alcohol Use: No  . Drug Use: No  . Sexual Activity: Not on file   Other Topics Concern  . Not on file   Social History Narrative   Lives with wife Jesus Martin (married 28 years) and son. Has 4 dogs and fish. Not employed. Woodworking for fun. HS  graduate.       Quit smoking in 2008. 26 pack years. No recreational drug use. Drinks 1 glass of wine per month. Does not exercise.     No family status information on file.    Review of Systems A complete 10 system ROS was obtained and was negative apart from what is mentioned.   Objective:   VITALS:   Filed Vitals:   11/15/14 0838  BP: 132/70  Pulse: 92  Height: 5' 11"  (1.803 m)  Weight: 241 lb (109.317 kg)   Gen:  Appears stated age and in NAD. HEENT:  Normocephalic, atraumatic. The mucous membranes are moist. The superficial temporal arteries are without ropiness or tenderness. Cardiovascular: Regular rate and rhythm. Lungs: Clear to auscultation bilaterally. Neck: There are no carotid bruits noted bilaterally.  NEUROLOGICAL:  Orientation:  The patient is alert but he is very somnolent.  His wife yells at him during the visit to try and awaken him several times.  He does, however, take a phone call during the visit.  He is awake during that. Cranial nerves: There is good facial symmetry with the exception of mild asymmetric nasolabial fold. The pupils are equal round and reactive to light bilaterally. Fundoscopic exam reveals clear disc margins bilaterally. Extraocular muscles are intact and visual fields are full to confrontational testing.  There is horizontal gaze nystagmus.  Speech is fluent and mildly dysarthric. Soft palate rises symmetrically and there is no tongue deviation. Hearing is intact to conversational tone. Tone: Tone is good throughout. Sensation: Sensation is intact to light touch and pinprick throughout (facial, trunk, extremities). Vibration is intact at the bilateral big toe. There is no extinction with double simultaneous stimulation. There is no sensory dermatomal level identified. Coordination:  The patient has no dysdiadichokinesia or dysmetria. Motor: Strength is 5/5 in the bilateral upper and lower extremities.  Shoulder shrug is equal bilaterally.   There is no pronator drift.  There are no fasciculations noted. DTR's: Deep tendon reflexes are 2/4 at the bilateral biceps, triceps, brachioradialis, patella and achilles.  Plantar responses are downgoing bilaterally. Gait and Station: The patient is able to ambulate without difficulty. The patient does have some difficulty in relating a tandem fashion.  MOVEMENT EXAM: Tremor:  There is mild to minimal tremor of the outstretched hands.  He does have asterixis (negative myoclonus) in the upper extremities bilaterally.  When attempting to pour water  from one glass to another, he spells the water because of asterixis.  He has evidence of mild tremor with Archimedes spirals.     Assessment/Plan:   1.  Asterixis  -Likely due to medication.  -Not sure which of these medications is the offender  -Going to be difficult to tell without eliminating meds one by one  -will do a neurologic work up to make sure that not missing other things.  Labs will be done including heavy metal screen, LFTS, ammonia, DOA panel, tegretol level, Ca++, Mg++, lead  -MRI brain and liver u/s (based on his insurance of [employer], his PCP may have to order testing)  -If the above workup is unremarkable, then I think that he will need to follow-up with his primary care physician/psychiatrist and talk about his medications as I think it is highly likely that one of these is the cause. 2.  Tremor  -This is really minor and likely just due to exposure to lithium.  The bigger issue is the asterixis. 3.  Excessive daytime hypersomnolence  -Again, I think that medication is the primary offender.  He did not mention this at all to me today.  He was falling asleep during much of the visit, but apparently took 2 mg of Ativan right before he walked in.  His wife states that he has to take this medication when he leaves the house because he generally does not like to leave the house.  A larger workup for daytime hypersomnolence while on  these medications is not going to be fruitful. 4.  Greater than 50% of 60 min in counseling and coordinating care

## 2014-11-16 LAB — HEPATIC FUNCTION PANEL
ALT: 19 U/L (ref 0–53)
AST: 22 U/L (ref 0–37)
Albumin: 4.5 g/dL (ref 3.5–5.2)
Alkaline Phosphatase: 58 U/L (ref 39–117)
Bilirubin, Direct: 0.1 mg/dL (ref 0.0–0.3)
Indirect Bilirubin: 0.3 mg/dL (ref 0.2–1.2)
Total Bilirubin: 0.4 mg/dL (ref 0.2–1.2)
Total Protein: 7.2 g/dL (ref 6.0–8.3)

## 2014-11-16 LAB — AMMONIA: Ammonia: 68 umol/L — ABNORMAL HIGH (ref 16–53)

## 2014-11-16 LAB — CARBAMAZEPINE LEVEL, TOTAL: Carbamazepine Lvl: 11 ug/mL (ref 4.0–12.0)

## 2014-11-16 LAB — CALCIUM: CALCIUM: 9.7 mg/dL (ref 8.4–10.5)

## 2014-11-16 LAB — MAGNESIUM: MAGNESIUM: 1.8 mg/dL (ref 1.5–2.5)

## 2014-11-17 LAB — LEAD, BLOOD: Lead-Whole Blood: <2 ug/dL (ref <10)

## 2014-11-20 ENCOUNTER — Telehealth: Payer: Self-pay | Admitting: Neurology

## 2014-11-20 NOTE — Telephone Encounter (Signed)
Please send labs that we have to PCP.  Not all are back yet but call [facility] to see if they are ready.  Let pt know that ammonia is just a little elevated and I don't think its clinically significant but want his PCP to check this further.  He should make a f/u with Dr. Chapman Fitch.

## 2014-11-21 LAB — OPIATES/OPIOIDS (LC/MS-MS)
Codeine Urine: NEGATIVE ng/mL (ref <50)
HYDROCODONE: NEGATIVE ng/mL (ref <50)
HYDROMORPHONE: NEGATIVE ng/mL (ref <50)
MORPHINE: NEGATIVE ng/mL (ref <50)
NOROXYCODONE, UR: NEGATIVE ng/mL (ref <50)
Norhydrocodone, Ur: NEGATIVE ng/mL (ref <50)
OXYCODONE, UR: NEGATIVE ng/mL (ref <50)
Oxymorphone: NEGATIVE ng/mL (ref <50)

## 2014-11-22 NOTE — Telephone Encounter (Signed)
Patient made aware of elevated labs. Results faxed to Dr Chapman Fitch at [phone] with confirmation received. They will contact Dr Chapman Fitch for a follow up appt.

## 2014-11-26 DIAGNOSIS — R251 Tremor, unspecified: Secondary | ICD-10-CM | POA: Diagnosis not present

## 2014-11-26 DIAGNOSIS — E7220 Disorder of urea cycle metabolism, unspecified: Secondary | ICD-10-CM | POA: Diagnosis not present

## 2014-11-26 DIAGNOSIS — R278 Other lack of coordination: Secondary | ICD-10-CM | POA: Diagnosis not present

## 2014-12-07 ENCOUNTER — Ambulatory Visit (HOSPITAL_COMMUNITY): Payer: Commercial Managed Care - HMO

## 2014-12-07 ENCOUNTER — Ambulatory Visit (HOSPITAL_COMMUNITY): Admission: RE | Admit: 2014-12-07 | Payer: Commercial Managed Care - HMO | Source: Ambulatory Visit

## 2014-12-28 ENCOUNTER — Ambulatory Visit (HOSPITAL_COMMUNITY): Payer: Commercial Managed Care - HMO

## 2014-12-28 ENCOUNTER — Ambulatory Visit (HOSPITAL_COMMUNITY): Admission: RE | Admit: 2014-12-28 | Payer: Commercial Managed Care - HMO | Source: Ambulatory Visit

## 2015-02-28 ENCOUNTER — Encounter (HOSPITAL_COMMUNITY): Payer: Self-pay

## 2015-02-28 ENCOUNTER — Emergency Department (HOSPITAL_COMMUNITY)
Admission: EM | Admit: 2015-02-28 | Discharge: 2015-02-28 | Disposition: A | Discharge status: Home / Self Care | Payer: Commercial Managed Care - HMO | Attending: Emergency Medicine | Admitting: Emergency Medicine

## 2015-02-28 DIAGNOSIS — K0381 Cracked tooth: Secondary | ICD-10-CM | POA: Insufficient documentation

## 2015-02-28 DIAGNOSIS — K047 Periapical abscess without sinus: Secondary | ICD-10-CM | POA: Insufficient documentation

## 2015-02-28 DIAGNOSIS — I10 Essential (primary) hypertension: Secondary | ICD-10-CM | POA: Insufficient documentation

## 2015-02-28 DIAGNOSIS — F319 Bipolar disorder, unspecified: Secondary | ICD-10-CM | POA: Insufficient documentation

## 2015-02-28 DIAGNOSIS — Z720 Tobacco use: Secondary | ICD-10-CM | POA: Insufficient documentation

## 2015-02-28 DIAGNOSIS — K088 Other specified disorders of teeth and supporting structures: Secondary | ICD-10-CM | POA: Diagnosis not present

## 2015-02-28 DIAGNOSIS — M797 Fibromyalgia: Secondary | ICD-10-CM | POA: Insufficient documentation

## 2015-02-28 DIAGNOSIS — K002 Abnormalities of size and form of teeth: Secondary | ICD-10-CM | POA: Insufficient documentation

## 2015-02-28 DIAGNOSIS — R51 Headache: Secondary | ICD-10-CM | POA: Insufficient documentation

## 2015-02-28 DIAGNOSIS — K029 Dental caries, unspecified: Secondary | ICD-10-CM | POA: Diagnosis not present

## 2015-02-28 DIAGNOSIS — Z79899 Other long term (current) drug therapy: Secondary | ICD-10-CM | POA: Insufficient documentation

## 2015-02-28 MED ORDER — HYDROCODONE-ACETAMINOPHEN 5-325 MG PO TABS: 1.0000 | ORAL_TABLET | Freq: Four times a day (QID) | ORAL | Status: DC | PRN

## 2015-02-28 MED ORDER — PENICILLIN V POTASSIUM 250 MG PO TABS
500.0000 mg | ORAL_TABLET | Freq: Once | ORAL | Status: AC
  Administered 2015-02-28: 500 mg via ORAL
  Filled 2015-02-28: qty 2

## 2015-02-28 MED ORDER — PENICILLIN V POTASSIUM 500 MG PO TABS: 500.0000 mg | ORAL_TABLET | Freq: Three times a day (TID) | ORAL | Status: DC

## 2015-02-28 MED ORDER — HYDROCODONE-ACETAMINOPHEN 5-325 MG PO TABS
2.0000 | ORAL_TABLET | Freq: Once | ORAL | Status: AC
  Administered 2015-02-28: 2 via ORAL
  Filled 2015-02-28: qty 2

## 2015-02-28 NOTE — ED Provider Notes (Signed)
CSN: [id number]     Arrival date & time 02/28/15  1447 History   This chart was scribed for [name], PA-C working with Orpah Greek, MD by Mercy Moore, ED Scribe. This patient was seen in room TR05C/TR05C and the patient's care was started at 3:13 PM.   Chief Complaint  Patient presents with  . Dental Pain   The history is provided by the patient. No language interpreter was used.   HPI Comments: Jesus Martin is a 54 y.o. male who presents to the Emergency Department complaining of right lower dental pain and facial swelling onset this morning upon wakening. Patient currently rates pain at 8/10. Patient reports prior fracture to affected tooth. Patient states that he has not seen a dentist for several years now. Patient reports treatment with ibuprofen today without relief. Patient denies fever, chills, nausea, or vomiting. Patient takes daily medication for his bipolar disorder, Hypertension, and fibromyalgia; wife reports he taken his morning doses.   Past Medical History  Diagnosis Date  . Bipolar I disorder     diagnosed age 27   . HTN (hypertension)   . Tobacco abuse   . Fibromyalgia    Past Surgical History  Procedure Laterality Date  . Appendectomy      1975  . Back surgery      1998  . Nose surgery     Family History  Problem Relation Age of Onset  . Adopted: Yes   History  Substance Use Topics  . Smoking status: Current Every Day Smoker -- 1.00 packs/day for 32 years    Types: Cigarettes  . Smokeless tobacco: Not on file     Comment: 1 pack a day with nicotine patch  . Alcohol Use: No    Review of Systems  Constitutional: Negative for fever, chills and appetite change.  HENT: Positive for dental problem and facial swelling. Negative for drooling, ear pain, nosebleeds, postnasal drip, rhinorrhea, sore throat and trouble swallowing.   Eyes: Negative for pain and redness.  Respiratory: Negative for cough and wheezing.   Cardiovascular:  Negative for chest pain.  Gastrointestinal: Negative for nausea, vomiting, abdominal pain and diarrhea.  Musculoskeletal: Negative for neck pain and neck stiffness.  Skin: Negative for color change and rash.  Neurological: Positive for headaches. Negative for weakness and light-headedness.  All other systems reviewed and are negative.  Allergies  Review of patient's allergies indicates no known allergies.  Home Medications   Prior to Admission medications   Medication Sig Start Date End Date Taking? Authorizing Provider  amLODipine (NORVASC) 5 MG tablet Take 5 mg by mouth daily.      Historical Provider, MD  carbamazepine (TEGRETOL) 200 MG tablet Take by mouth. 400 mg in the morning, 600 mg in the evening    Historical Provider, MD  cyanocobalamin 100 MCG tablet Take 100 mcg by mouth daily.    Historical Provider, MD  HYDROcodone-acetaminophen (NORCO/VICODIN) 5-325 MG per tablet Take 1-2 tablets by mouth every 6 (six) hours as needed for moderate pain or severe pain. 02/28/15   Nikol Lemar, PA-C  ibuprofen (ADVIL,MOTRIN) 200 MG tablet Take 200 mg by mouth every 6 (six) hours as needed for pain.    Historical Provider, MD  lamoTRIgine (LAMICTAL) 200 MG tablet Take 200 mg by mouth daily. 2 times per day    Historical Provider, MD  lisinopril-hydrochlorothiazide (PRINZIDE,ZESTORETIC) 20-12.5 MG per tablet Take 1 tablet by mouth daily.      Historical Provider, MD  lithium  carbonate (ESKALITH) 450 MG CR tablet Take 450 mg by mouth 2 (two) times daily. 1-2x per day      Historical Provider, MD  LORazepam (ATIVAN) 1 MG tablet Take 1 mg by mouth every 8 (eight) hours as needed.      Historical Provider, MD  omeprazole (PRILOSEC) 40 MG capsule Take 40 mg by mouth daily.    Historical Provider, MD  penicillin v potassium (VEETID) 500 MG tablet Take 1 tablet (500 mg total) by mouth 3 (three) times daily. 02/28/15   Vetra Shinall, PA-C  pregabalin (LYRICA) 300 MG capsule Take 300 mg by  mouth 2 (two) times daily.    Historical Provider, MD  QUEtiapine (SEROQUEL) 400 MG tablet Take 800 mg by mouth at bedtime.    Historical Provider, MD   Triage Vitals: BP 170/95 mmHg  Pulse 81  Temp(Src) 98.7 F (37.1 C) (Oral)  Resp 18  Ht 6' 1"  (1.854 m)  Wt 240 lb (108.863 kg)  BMI 31.67 kg/m2  SpO2 97% Physical Exam  Constitutional: He appears well-developed and well-nourished.  HENT:  Head: Normocephalic.  Right Ear: Tympanic membrane, external ear and ear canal normal.  Left Ear: Tympanic membrane, external ear and ear canal normal.  Nose: Nose normal. Right sinus exhibits no maxillary sinus tenderness and no frontal sinus tenderness. Left sinus exhibits no maxillary sinus tenderness and no frontal sinus tenderness.  Mouth/Throat: Uvula is midline, oropharynx is clear and moist and mucous membranes are normal. No oral lesions. Abnormal dentition. Dental caries present. No uvula swelling or lacerations. No oropharyngeal exudate, posterior oropharyngeal edema, posterior oropharyngeal erythema or tonsillar abscesses.  Poor dentition and multiple dental caries throughout. Many of his teeth are broken secondary to dental carries. Tender to palpation of tooth #19 and #20 with erythema and edema of the gingival and surrounding tissue. No edema or induration to floor of the mouth or submandibular space.   Eyes: Conjunctivae are normal. Pupils are equal, round, and reactive to light. Right eye exhibits no discharge. Left eye exhibits no discharge.  Neck: Normal range of motion. Neck supple.  No stridor Handling secretions without difficulty No nuchal rigidity No cervical lymphadenopathy   Cardiovascular: Normal rate, regular rhythm and normal heart sounds.   Pulmonary/Chest: Effort normal. No respiratory distress.  Equal chest rise  Abdominal: Soft. Bowel sounds are normal. He exhibits no distension. There is no tenderness.  Lymphadenopathy:    He has no cervical adenopathy.   Neurological: He is alert.  Skin: Skin is warm and dry.  Psychiatric: He has a normal mood and affect.  Nursing note and vitals reviewed.   ED Course  Procedures (including critical care time)  COORDINATION OF CARE: 3:19 PM- Discussed treatment plan with patient at bedside and patient agreed to plan.   Labs Review Labs Reviewed - No data to display  Imaging Review No results found.   EKG Interpretation None      MDM   Final diagnoses:  Pain due to dental caries  Dental infection   [name] presents with dental pain.  Patient with toothache.  No gross abscess.  Exam unconcerning for Ludwig's angina or spread of infection.  Will treat with penicillin and pain medicine.  Urged patient to follow-up with dentist.    BP 170/95 mmHg  Pulse 81  Temp(Src) 98.7 F (37.1 C) (Oral)  Resp 18  Ht 6' 1"  (1.854 m)  Wt 240 lb (108.863 kg)  BMI 31.67 kg/m2  SpO2 97%   I  personally performed the services described in this documentation, which was scribed in my presence. The recorded information has been reviewed and is accurate.   Jarrett Soho Kiaraliz Rafuse, PA-C 02/28/15 [place], MD 03/01/15 [phone]

## 2015-02-28 NOTE — Discharge Instructions (Signed)
1. Medications: vicodin, penicillin, usual home medications 2. Treatment: rest, drink plenty of fluids, take medications as prescribed 3. Follow Up: Please followup with dentistry within 1 week for discussion of your diagnoses and further evaluation after today's visit; if you do not have a primary care doctor use the resource guide provided to find one; Return to the ER for high fevers, difficulty breathing, difficulty swallowing or other concerning symptoms    Dental Pain A tooth ache may be caused by cavities (tooth decay). Cavities expose the nerve of the tooth to air and hot or cold temperatures. It may come from an infection or abscess (also called a boil or furuncle) around your tooth. It is also often caused by dental caries (tooth decay). This causes the pain you are having. DIAGNOSIS  Your caregiver can diagnose this problem by exam. TREATMENT   If caused by an infection, it may be treated with medications which kill germs (antibiotics) and pain medications as prescribed by your caregiver. Take medications as directed.  Only take over-the-counter or prescription medicines for pain, discomfort, or fever as directed by your caregiver.  Whether the tooth ache today is caused by infection or dental disease, you should see your dentist as soon as possible for further care. SEEK MEDICAL CARE IF: The exam and treatment you received today has been provided on an emergency basis only. This is not a substitute for complete medical or dental care. If your problem worsens or new problems (symptoms) appear, and you are unable to meet with your dentist, call or return to this location. SEEK IMMEDIATE MEDICAL CARE IF:   You have a fever.  You develop redness and swelling of your face, jaw, or neck.  You are unable to open your mouth.  You have severe pain uncontrolled by pain medicine. MAKE SURE YOU:   Understand these instructions.  Will watch your condition.  Will get help right away  if you are not doing well or get worse. Document Released: 07/27/2005 Document Revised: 10/19/2011 Document Reviewed: 03/14/2008 [facility] Patient Information 2015 [place], [place]. This information is not intended to replace advice given to you by your health care provider. Make sure you discuss any questions you have with your health care provider.

## 2015-02-28 NOTE — ED Notes (Signed)
Pt presents with 2 day h/o R lower tooth pain.  Pt reports tooth is broken and started swelling today.

## 2015-03-06 DIAGNOSIS — E7220 Disorder of urea cycle metabolism, unspecified: Secondary | ICD-10-CM | POA: Diagnosis not present

## 2015-03-06 DIAGNOSIS — I10 Essential (primary) hypertension: Secondary | ICD-10-CM | POA: Diagnosis not present

## 2015-03-06 DIAGNOSIS — Z79899 Other long term (current) drug therapy: Secondary | ICD-10-CM | POA: Diagnosis not present

## 2015-03-06 DIAGNOSIS — M25572 Pain in left ankle and joints of left foot: Secondary | ICD-10-CM | POA: Diagnosis not present

## 2015-03-06 DIAGNOSIS — Z91038 Other insect allergy status: Secondary | ICD-10-CM | POA: Diagnosis not present

## 2015-03-06 DIAGNOSIS — E785 Hyperlipidemia, unspecified: Secondary | ICD-10-CM | POA: Diagnosis not present

## 2015-03-06 DIAGNOSIS — K047 Periapical abscess without sinus: Secondary | ICD-10-CM | POA: Diagnosis not present

## 2015-03-06 DIAGNOSIS — F3131 Bipolar disorder, current episode depressed, mild: Secondary | ICD-10-CM | POA: Diagnosis not present

## 2015-03-12 ENCOUNTER — Ambulatory Visit
Admission: RE | Admit: 2015-03-12 | Discharge: 2015-03-12 | Disposition: A | Discharge status: Home / Self Care | Payer: Commercial Managed Care - HMO | Source: Ambulatory Visit | Attending: Family Medicine | Admitting: Family Medicine

## 2015-03-12 ENCOUNTER — Other Ambulatory Visit: Payer: Self-pay | Admitting: Family Medicine

## 2015-03-12 DIAGNOSIS — M79672 Pain in left foot: Secondary | ICD-10-CM

## 2015-03-12 DIAGNOSIS — M7732 Calcaneal spur, left foot: Secondary | ICD-10-CM | POA: Diagnosis not present

## 2015-03-12 DIAGNOSIS — M19072 Primary osteoarthritis, left ankle and foot: Secondary | ICD-10-CM | POA: Diagnosis not present

## 2015-04-30 DIAGNOSIS — Z8781 Personal history of (healed) traumatic fracture: Secondary | ICD-10-CM | POA: Diagnosis not present

## 2015-04-30 DIAGNOSIS — M25572 Pain in left ankle and joints of left foot: Secondary | ICD-10-CM | POA: Diagnosis not present

## 2015-04-30 DIAGNOSIS — Z23 Encounter for immunization: Secondary | ICD-10-CM | POA: Diagnosis not present

## 2015-05-07 DIAGNOSIS — M25572 Pain in left ankle and joints of left foot: Secondary | ICD-10-CM | POA: Diagnosis not present

## 2015-05-30 DIAGNOSIS — M25572 Pain in left ankle and joints of left foot: Secondary | ICD-10-CM | POA: Diagnosis not present

## 2015-06-13 DIAGNOSIS — M25572 Pain in left ankle and joints of left foot: Secondary | ICD-10-CM | POA: Diagnosis not present

## 2015-11-05 DIAGNOSIS — R110 Nausea: Secondary | ICD-10-CM | POA: Diagnosis not present

## 2015-11-05 DIAGNOSIS — M150 Primary generalized (osteo)arthritis: Secondary | ICD-10-CM | POA: Diagnosis not present

## 2015-11-05 DIAGNOSIS — E785 Hyperlipidemia, unspecified: Secondary | ICD-10-CM | POA: Diagnosis not present

## 2015-11-05 DIAGNOSIS — I10 Essential (primary) hypertension: Secondary | ICD-10-CM | POA: Diagnosis not present

## 2015-11-05 DIAGNOSIS — R1013 Epigastric pain: Secondary | ICD-10-CM | POA: Diagnosis not present

## 2015-11-06 ENCOUNTER — Other Ambulatory Visit: Payer: Self-pay | Admitting: Family Medicine

## 2015-11-06 ENCOUNTER — Encounter (HOSPITAL_COMMUNITY): Payer: Self-pay | Admitting: Emergency Medicine

## 2015-11-06 ENCOUNTER — Emergency Department (HOSPITAL_COMMUNITY)
Admission: EM | Admit: 2015-11-06 | Discharge: 2015-11-06 | Disposition: A | Discharge status: Home / Self Care | Payer: Commercial Managed Care - HMO | Attending: Emergency Medicine | Admitting: Emergency Medicine

## 2015-11-06 DIAGNOSIS — F17210 Nicotine dependence, cigarettes, uncomplicated: Secondary | ICD-10-CM | POA: Insufficient documentation

## 2015-11-06 DIAGNOSIS — I10 Essential (primary) hypertension: Secondary | ICD-10-CM | POA: Insufficient documentation

## 2015-11-06 DIAGNOSIS — R1013 Epigastric pain: Secondary | ICD-10-CM

## 2015-11-06 DIAGNOSIS — R197 Diarrhea, unspecified: Secondary | ICD-10-CM | POA: Diagnosis not present

## 2015-11-06 DIAGNOSIS — R112 Nausea with vomiting, unspecified: Secondary | ICD-10-CM | POA: Insufficient documentation

## 2015-11-06 DIAGNOSIS — R109 Unspecified abdominal pain: Secondary | ICD-10-CM | POA: Insufficient documentation

## 2015-11-06 LAB — COMPREHENSIVE METABOLIC PANEL
ALBUMIN: 4.1 g/dL (ref 3.5–5.0)
ALK PHOS: 57 U/L (ref 38–126)
ALT: 27 U/L (ref 17–63)
ANION GAP: 8 (ref 5–15)
AST: 23 U/L (ref 15–41)
BUN: 7 mg/dL (ref 6–20)
CALCIUM: 9.9 mg/dL (ref 8.9–10.3)
CO2: 28 mmol/L (ref 22–32)
CREATININE: 0.87 mg/dL (ref 0.61–1.24)
Chloride: 100 mmol/L — ABNORMAL LOW (ref 101–111)
GFR calc Af Amer: >60 mL/min (ref >60)
GFR calc non Af Amer: >60 mL/min (ref >60)
GLUCOSE: 133 mg/dL — AB (ref 65–99)
Potassium: 4.1 mmol/L (ref 3.5–5.1)
SODIUM: 136 mmol/L (ref 135–145)
Total Bilirubin: 0.3 mg/dL (ref 0.3–1.2)
Total Protein: 6.5 g/dL (ref 6.5–8.1)

## 2015-11-06 LAB — URINALYSIS, ROUTINE W REFLEX MICROSCOPIC
BILIRUBIN URINE: NEGATIVE
Glucose, UA: NEGATIVE mg/dL
HGB URINE DIPSTICK: NEGATIVE
Ketones, ur: NEGATIVE mg/dL
Leukocytes, UA: NEGATIVE
Nitrite: NEGATIVE
Protein, ur: NEGATIVE mg/dL
SPECIFIC GRAVITY, URINE: 1.009 (ref 1.005–1.030)
pH: 7.5 (ref 5.0–8.0)

## 2015-11-06 LAB — CBC
HCT: 40.1 % (ref 39.0–52.0)
HEMOGLOBIN: 13.5 g/dL (ref 13.0–17.0)
MCH: 32.6 pg (ref 26.0–34.0)
MCHC: 33.7 g/dL (ref 30.0–36.0)
MCV: 96.9 fL (ref 78.0–100.0)
Platelets: 240 10*3/uL (ref 150–400)
RBC: 4.14 MIL/uL — ABNORMAL LOW (ref 4.22–5.81)
RDW: 12.1 % (ref 11.5–15.5)
WBC: 9.5 10*3/uL (ref 4.0–10.5)

## 2015-11-06 LAB — LIPASE, BLOOD: Lipase: 32 U/L (ref 11–51)

## 2015-11-06 NOTE — ED Notes (Signed)
Pt not in lobby for reassessment

## 2015-11-06 NOTE — ED Notes (Signed)
Pt here for abd pain with some N/V/D x 1 week

## 2015-11-06 NOTE — ED Notes (Signed)
Pt not in lobby again for reassessment

## 2015-11-07 ENCOUNTER — Ambulatory Visit
Admission: RE | Admit: 2015-11-07 | Discharge: 2015-11-07 | Disposition: A | Discharge status: Home / Self Care | Payer: Commercial Managed Care - HMO | Source: Ambulatory Visit | Attending: Family Medicine | Admitting: Family Medicine

## 2015-11-07 DIAGNOSIS — R1013 Epigastric pain: Secondary | ICD-10-CM

## 2015-11-07 MED ORDER — IOPAMIDOL (ISOVUE-300) INJECTION 61%
100.0000 mL | Freq: Once | INTRAVENOUS | Status: AC | PRN
  Administered 2015-11-07: 100 mL via INTRAVENOUS

## 2015-11-14 DIAGNOSIS — R1013 Epigastric pain: Secondary | ICD-10-CM | POA: Diagnosis not present

## 2015-11-14 DIAGNOSIS — R110 Nausea: Secondary | ICD-10-CM | POA: Diagnosis not present

## 2015-12-04 DIAGNOSIS — R1013 Epigastric pain: Secondary | ICD-10-CM | POA: Diagnosis not present

## 2015-12-04 DIAGNOSIS — R197 Diarrhea, unspecified: Secondary | ICD-10-CM | POA: Diagnosis not present

## 2015-12-04 DIAGNOSIS — R748 Abnormal levels of other serum enzymes: Secondary | ICD-10-CM | POA: Diagnosis not present

## 2016-06-17 DIAGNOSIS — M150 Primary generalized (osteo)arthritis: Secondary | ICD-10-CM | POA: Diagnosis not present

## 2016-06-17 DIAGNOSIS — J209 Acute bronchitis, unspecified: Secondary | ICD-10-CM | POA: Diagnosis not present

## 2016-06-17 DIAGNOSIS — Z23 Encounter for immunization: Secondary | ICD-10-CM | POA: Diagnosis not present

## 2016-06-17 DIAGNOSIS — I10 Essential (primary) hypertension: Secondary | ICD-10-CM | POA: Diagnosis not present

## 2016-07-29 DIAGNOSIS — E785 Hyperlipidemia, unspecified: Secondary | ICD-10-CM | POA: Diagnosis not present

## 2016-07-29 DIAGNOSIS — Z79899 Other long term (current) drug therapy: Secondary | ICD-10-CM | POA: Diagnosis not present

## 2016-07-29 DIAGNOSIS — F3131 Bipolar disorder, current episode depressed, mild: Secondary | ICD-10-CM | POA: Diagnosis not present

## 2016-07-31 DIAGNOSIS — Z79899 Other long term (current) drug therapy: Secondary | ICD-10-CM | POA: Diagnosis not present

## 2016-07-31 DIAGNOSIS — R944 Abnormal results of kidney function studies: Secondary | ICD-10-CM | POA: Diagnosis not present

## 2016-07-31 DIAGNOSIS — R4689 Other symptoms and signs involving appearance and behavior: Secondary | ICD-10-CM | POA: Diagnosis not present

## 2016-07-31 DIAGNOSIS — S60221A Contusion of right hand, initial encounter: Secondary | ICD-10-CM | POA: Diagnosis not present

## 2016-08-04 ENCOUNTER — Other Ambulatory Visit: Payer: Self-pay | Admitting: Family Medicine

## 2016-08-04 DIAGNOSIS — R4689 Other symptoms and signs involving appearance and behavior: Secondary | ICD-10-CM

## 2016-08-13 ENCOUNTER — Inpatient Hospital Stay: Admission: RE | Admit: 2016-08-13 | Payer: Commercial Managed Care - HMO | Source: Ambulatory Visit

## 2016-09-07 DIAGNOSIS — G9389 Other specified disorders of brain: Secondary | ICD-10-CM | POA: Diagnosis not present

## 2016-09-16 DIAGNOSIS — R4189 Other symptoms and signs involving cognitive functions and awareness: Secondary | ICD-10-CM | POA: Diagnosis not present

## 2016-11-02 ENCOUNTER — Ambulatory Visit (INDEPENDENT_AMBULATORY_CARE_PROVIDER_SITE_OTHER): Payer: Medicare HMO | Admitting: Psychology

## 2016-11-02 DIAGNOSIS — R413 Other amnesia: Secondary | ICD-10-CM

## 2016-11-02 DIAGNOSIS — F3130 Bipolar disorder, current episode depressed, mild or moderate severity, unspecified: Secondary | ICD-10-CM

## 2016-11-02 NOTE — Progress Notes (Signed)
NEUROPSYCHOLOGICAL INTERVIEW (CPT: [id number])  Name: Jesus Martin Date of Birth: 12/15/1960 Date of Interview: 11/02/2016  Reason for Referral:  Jesus Martin is a 56 y.o. right handed male who is referred for neuropsychological evaluation by Dr. Aura Dials of [facility] due to concerns about memory decline. This patient is accompanied in the office by his wife who supplements the history.  History of Presenting Problem:  Jesus Martin was most recently seen by Dr. Sheryn Bison on 09/16/2016. MMSE at that time was 13/28. His wife reported worsening memory, falls and hypersomnolence. 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 These could be due to intracranial atherosclerosis or conceivably could be embolic in etiology. Mild generalized cerebellar volume loss, nonspecific. No evidence of acute intracranial abnormality." Dr. Sheryn Bison suspected medications could be contributing to some of his symptoms and recommended evaluation by a Pharm.D as well as neurocognitive testing.  On review of his records in [place], I see that Jesus Martin was seen by my colleague, Dr. Wells Guiles Tat, for neurologic consultation two years ago (11/15/2014). He was seen for tremor. 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 Dr. Carles Collet felt this was likely due to medication (on multiple psychiatric medications). His tremor was said to be minor and likely just due to exposure to lithium.   At today's visit, the patient endorses concerns about memory and cognitive functioning. He relies on his wife to provide the majority of his history. His wife states  that she has noticed significant worsening of memory and tremors over at least the past six months. She stated that he had abrupt onset of difficulty walking and talking with reduced balance, and this was not associated with any change in medication or other medical etiology. He now requires assistance getting dressed and sometimes has trouble feeding himself. He is having urinary frequency/urgency and sometimes does not make it to the bathroom in time. He has nighttime incontinence. There has been no significant change in personality or mood. He has a long history of bipolar disorder and his wife says "there are always fluctuations in mood" but this has not changed significantly. She does think he is quicker to get angry and is more argumentative. It seems that his mobility/motor symptoms are variable; three days ago, his wife states he was unable to walk independently. He fell and she could not get him up. He was a little better the next day and much better yesterday. She says there are days when he cannot feed himself or walk by himself but other days he is able to although his balance stays very poor.  Upon direct questioning, the patient and his wife reported the following cognitive problems at the current time: forgetting recent conversations/events, repeating statements/questions, misplacing/losing items, difficulty concentrating, distractibility, processing information more slowly, word-finding difficulty, and comprehension difficulty. There are episodes in which he has great difficulty speaking; he says that he tries to say something but "just forgets".  Jesus Martin has been on disability for 10+ years. He tends to stay at home as he does not like to leave the house. He stopped driving about 18 mos ago. His wife has always managed his medications, including dispensing them to him (he does not have access  to them). He used to manage the bills/finances, but they now do it together. His wife manages his  appointments. He used to do all the cooking but he does not do any cooking anymore. He wants to do it, but he is not able to both physically and mentally.  His psychiatrist is Dr. Toy Care and he has been seeing her since 2004 when he moved to [facility]. He now sees her every six months. The only medication change in the past year was the addition of Depakote for anger outbursts. His wife noted that a year ago, Dr. Toy Care told them that his anger outbursts are not part of bipolar disorder but are instead a separate disorder. They do think the Depakote has curbed his anger to some extent. He does still have anger outbursts but is not physically aggressive. He has not had a manic episode in years. He does feel depressed a lot of the time but not all the time. He reports anxiety/stress related to being unable to support his family financially. He also has stress related to the fact that he and his wife are caring for 3 young children and 8 dogs. He endorses passive suicidal ideation but denies intention. His wife says there have been a few occasions where she was worried about his safety. He has a history of two psychiatric hospitalizations; one was at age 41 when he was diagnosed with bipolar disorder, the other was about three years later for medication changes.  Physically, he reports he has some chronic pain. He has hurt himself when he has fallen. His wife states that he falls daily "because he sneaks outside and falls down the steps". He refuses to use a walker or cane. They deny history of significant head injury or LOC associated with any falls. Two weeks ago, he did fall backwards into the bathtub and hit his head on the shower wall but had no LOC.  Per the patient's wife, Dr. Toy Care told them that his tremors/neurologic symptoms are not likely due to any of the psychiatric medications he is taking because he has been on the medications for so long.  The patient and his wife reported hypersomnolence. He has  difficulty staying awake during the day. He feels tired most of the time. He has had "no energy" for the past few months. His wife also reported significantly reduced appetite over the past six months. He also has been having visual hallucinations recently when he is meditating. He claims to see spirits of deceased people and animals as well as images of people and pets that are still alive. He is not afraid of them or bothered by this experience. This has been going for for the past 2 months.  He denied past or present substance abuse or dependence.  Family history is unknown as the patient was adopted.   Social History: Born/Raised: [place], [place]. He was in the foster care system for a year and then adopted. He had significant developmental delays and didn't speak until almost 59 yo, per his wife. He was adopted just before 3yo. He had a very good relationship with his adoptive parents. They continued to be foster parents throughout his childhood so there were frequently many children around. His adoptive parents are now deceased. Education: HS graduate Occupational history: previously a Physiological scientist, currently on disability  Marital history: Married x 33 years. 2 children (son and daughter). 1 grandchild. Alcohol/Tobacco/Substances: He hadn't drank in 15 years but now drinks Corona beer- it started with  1 a day - now 2 or 3 a day, some days 4 in a day - even though family tells him not to. He has been a tobacco user since age 43. He has cut down on the amount he smokes. He now smokes approximately 1/4 ppd. He also uses smokeless tobacco. Living situation: The patient lives in a private residence with his wife, adult son, 3 children under the age of 56 (not related to them, but they are the grandchildren of a friend and they are raising them), and the patient's best friend who moved in 4-5 years ago and is frequently traveling for work. They also have 8 pet dogs.   Medical History: Past  Medical History:  Diagnosis Date  . Bipolar I disorder ([place])    diagnosed age 38   . Fibromyalgia   . HTN (hypertension)   . Tobacco abuse     Current Medications:  (Per Dr. March Rummage note and confirmed by the patient's wife) Lorazepam 2 mg tablet PRN Seroquel XR 50 mg 4 tablets nightly Lithium Carbonate 450 mg ER 1.5 qAM, 1 qPM Lamictal 150 mg twice daily Aspirin 81 mg daily Omeprazole 40 mg daily Lisinopril-HCTZ 20-12.5 mg twice daily Ondansetron HCl 4 mg three times daily PRN Depakote 250 mg 1qAM, 2qPM Tramadol HCl 50 mg twice daily PRN Amlodipine Besylate 5 mg daily   Behavioral Observations:   Appearance: Casually and appropriately dressed and groomed Gait: Ambulated independently, unsteady with reduced balance, slowed gait Speech: Mumbling speech with low volume (his wife says this has been the case for the past 18 mos). Sparse.  Thought process: Appears linear Affect: Blunted, does brighten on occasion and joke with his wife. Interpersonal: Does interact and engage to some extent but relies on his wife to provide most of the history   TESTING: There is medical necessity to proceed with neuropsychological assessment as the results will be used to aid in differential diagnosis and clinical decision-making and to inform specific treatment recommendations. Per the patient, his wife and medical records reviewed, there has been a change in cognitive functioning and a reasonable suspicion of dementia which may be related to effects of multiple sedating medications.   PLAN: The patient will return for a full battery of neuropsychological testing with a psychometrician under my supervision. Education regarding testing procedures was provided. Subsequently, the patient will see this provider for a follow-up session at which time his test performances and my impressions and treatment recommendations will be reviewed in detail.   Full neuropsychological evaluation report to  follow.

## 2016-11-03 ENCOUNTER — Encounter: Payer: Self-pay | Admitting: Psychology

## 2016-12-22 ENCOUNTER — Encounter: Payer: Medicare HMO | Admitting: Psychology

## 2017-03-03 ENCOUNTER — Ambulatory Visit
Admission: RE | Admit: 2017-03-03 | Discharge: 2017-03-03 | Disposition: A | Discharge status: Home / Self Care | Payer: Medicare HMO | Source: Ambulatory Visit | Attending: Nurse Practitioner | Admitting: Nurse Practitioner

## 2017-03-03 ENCOUNTER — Other Ambulatory Visit (HOSPITAL_COMMUNITY): Payer: Self-pay | Admitting: Respiratory Therapy

## 2017-03-03 ENCOUNTER — Other Ambulatory Visit: Payer: Self-pay | Admitting: Nurse Practitioner

## 2017-03-03 DIAGNOSIS — R0602 Shortness of breath: Secondary | ICD-10-CM | POA: Diagnosis not present

## 2017-03-03 DIAGNOSIS — M150 Primary generalized (osteo)arthritis: Secondary | ICD-10-CM | POA: Diagnosis not present

## 2017-03-03 DIAGNOSIS — I10 Essential (primary) hypertension: Secondary | ICD-10-CM | POA: Diagnosis not present

## 2017-03-03 DIAGNOSIS — E559 Vitamin D deficiency, unspecified: Secondary | ICD-10-CM | POA: Diagnosis not present

## 2017-03-03 DIAGNOSIS — F17200 Nicotine dependence, unspecified, uncomplicated: Secondary | ICD-10-CM | POA: Diagnosis not present

## 2017-03-03 DIAGNOSIS — R05 Cough: Secondary | ICD-10-CM | POA: Diagnosis not present

## 2017-03-03 DIAGNOSIS — R0689 Other abnormalities of breathing: Secondary | ICD-10-CM | POA: Diagnosis not present

## 2017-03-03 DIAGNOSIS — E785 Hyperlipidemia, unspecified: Secondary | ICD-10-CM | POA: Diagnosis not present

## 2017-03-03 DIAGNOSIS — F3131 Bipolar disorder, current episode depressed, mild: Secondary | ICD-10-CM | POA: Diagnosis not present

## 2017-03-05 ENCOUNTER — Inpatient Hospital Stay (HOSPITAL_COMMUNITY): Admission: RE | Admit: 2017-03-05 | Payer: Medicare HMO | Source: Ambulatory Visit

## 2017-04-07 DIAGNOSIS — I10 Essential (primary) hypertension: Secondary | ICD-10-CM | POA: Diagnosis not present

## 2017-04-07 DIAGNOSIS — F17210 Nicotine dependence, cigarettes, uncomplicated: Secondary | ICD-10-CM | POA: Diagnosis not present

## 2017-04-07 DIAGNOSIS — K219 Gastro-esophageal reflux disease without esophagitis: Secondary | ICD-10-CM | POA: Diagnosis not present

## 2017-04-07 DIAGNOSIS — M545 Low back pain: Secondary | ICD-10-CM | POA: Diagnosis not present

## 2017-04-07 DIAGNOSIS — R0602 Shortness of breath: Secondary | ICD-10-CM | POA: Diagnosis not present

## 2017-04-13 ENCOUNTER — Ambulatory Visit (HOSPITAL_COMMUNITY)
Admission: RE | Admit: 2017-04-13 | Discharge: 2017-04-13 | Disposition: A | Discharge status: Home / Self Care | Payer: Medicare HMO | Source: Ambulatory Visit | Attending: Nurse Practitioner | Admitting: Nurse Practitioner

## 2017-04-13 DIAGNOSIS — F17210 Nicotine dependence, cigarettes, uncomplicated: Secondary | ICD-10-CM | POA: Insufficient documentation

## 2017-04-13 DIAGNOSIS — R0602 Shortness of breath: Secondary | ICD-10-CM | POA: Diagnosis not present

## 2017-04-13 DIAGNOSIS — J449 Chronic obstructive pulmonary disease, unspecified: Secondary | ICD-10-CM | POA: Insufficient documentation

## 2017-04-13 LAB — PULMONARY FUNCTION TEST
FEF 25-75 Pre: 2.16 L/sec
FEF2575-%Pred-Pre: 62 %
FEV1-%Pred-Pre: 62 %
FEV1-PRE: 2.57 L
FEV1FVC-%Pred-Pre: 90 %
FEV6-%PRED-PRE: 68 %
FEV6-PRE: 3.57 L
FEV6FVC-%Pred-Pre: 100 %
FVC-%PRED-PRE: 67 %
FVC-Pre: 3.68 L
Pre FEV1/FVC ratio: 70 %
Pre FEV6/FVC Ratio: 97 %

## 2017-05-03 DIAGNOSIS — F17210 Nicotine dependence, cigarettes, uncomplicated: Secondary | ICD-10-CM | POA: Diagnosis not present

## 2017-05-03 DIAGNOSIS — Z1389 Encounter for screening for other disorder: Secondary | ICD-10-CM | POA: Diagnosis not present

## 2017-05-03 DIAGNOSIS — F3181 Bipolar II disorder: Secondary | ICD-10-CM | POA: Diagnosis not present

## 2017-05-03 DIAGNOSIS — J449 Chronic obstructive pulmonary disease, unspecified: Secondary | ICD-10-CM | POA: Diagnosis not present

## 2017-05-03 DIAGNOSIS — Z23 Encounter for immunization: Secondary | ICD-10-CM | POA: Diagnosis not present

## 2017-05-03 DIAGNOSIS — I10 Essential (primary) hypertension: Secondary | ICD-10-CM | POA: Diagnosis not present

## 2017-07-29 ENCOUNTER — Ambulatory Visit
Admission: RE | Admit: 2017-07-29 | Discharge: 2017-07-29 | Disposition: A | Discharge status: Home / Self Care | Payer: Medicare HMO | Source: Ambulatory Visit | Attending: Internal Medicine | Admitting: Internal Medicine

## 2017-07-29 ENCOUNTER — Other Ambulatory Visit: Payer: Self-pay | Admitting: Internal Medicine

## 2017-07-29 DIAGNOSIS — I10 Essential (primary) hypertension: Secondary | ICD-10-CM | POA: Diagnosis not present

## 2017-07-29 DIAGNOSIS — E559 Vitamin D deficiency, unspecified: Secondary | ICD-10-CM | POA: Diagnosis not present

## 2017-07-29 DIAGNOSIS — R7309 Other abnormal glucose: Secondary | ICD-10-CM | POA: Diagnosis not present

## 2017-07-29 DIAGNOSIS — F319 Bipolar disorder, unspecified: Secondary | ICD-10-CM | POA: Diagnosis not present

## 2017-07-29 DIAGNOSIS — K219 Gastro-esophageal reflux disease without esophagitis: Secondary | ICD-10-CM | POA: Diagnosis not present

## 2017-07-29 DIAGNOSIS — R079 Chest pain, unspecified: Secondary | ICD-10-CM

## 2017-07-29 DIAGNOSIS — J449 Chronic obstructive pulmonary disease, unspecified: Secondary | ICD-10-CM | POA: Diagnosis not present

## 2017-08-23 NOTE — Progress Notes (Deleted)
Cardiology Office Note   Date:  08/23/2017   ID:  Jesus Martin, DOB [birth year], MRN [id number]  PCP:  Jesus Rossetti, NP  Cardiologist:   Skeet Latch, MD   No chief complaint on file.     History of Present Illness: Jesus Martin is a 57 y.o. male with hypertension, tobacco abuse and bipolar disorder who is being seen today for the evaluation of *** at the request of Jesus Rossetti, NP.     Past Medical History:  Diagnosis Date  . Bipolar I disorder ([place])    diagnosed age 28   . Fibromyalgia   . HTN (hypertension)   . Tobacco abuse     Past Surgical History:  Procedure Laterality Date  . APPENDECTOMY     1975  . BACK SURGERY     1998  . NOSE SURGERY       Current Outpatient Medications  Medication Sig Dispense Refill  . amLODipine (NORVASC) 5 MG tablet Take 5 mg by mouth daily.      . carbamazepine (TEGRETOL) 200 MG tablet Take by mouth. 400 mg in the morning, 600 mg in the evening    . cyanocobalamin 100 MCG tablet Take 100 mcg by mouth daily.    . divalproex (DEPAKOTE ER) 250 MG 24 hr tablet Take 250 mg by mouth once.    Marland Kitchen HYDROcodone-acetaminophen (NORCO/VICODIN) 5-325 MG per tablet Take 1-2 tablets by mouth every 6 (six) hours as needed for moderate pain or severe pain. 11 tablet 0  . ibuprofen (ADVIL,MOTRIN) 200 MG tablet Take 200 mg by mouth every 6 (six) hours as needed for pain.    Marland Kitchen lamoTRIgine (LAMICTAL) 200 MG tablet Take 200 mg by mouth daily. 2 times per day    . lisinopril-hydrochlorothiazide (PRINZIDE,ZESTORETIC) 20-12.5 MG per tablet Take 1 tablet by mouth daily.      Marland Kitchen lithium carbonate (ESKALITH) 450 MG CR tablet Take 450 mg by mouth 2 (two) times daily. 1-2x per day      . LORazepam (ATIVAN) 1 MG tablet Take 1 mg by mouth every 8 (eight) hours as needed.      Marland Kitchen omeprazole (PRILOSEC) 40 MG capsule Take 40 mg by mouth daily.    . penicillin v potassium (VEETID) 500 MG tablet Take 1 tablet (500 mg total) by mouth 3 (three) times  daily. 30 tablet 0  . pregabalin (LYRICA) 300 MG capsule Take 300 mg by mouth 2 (two) times daily.    . QUEtiapine (SEROQUEL) 400 MG tablet Take 800 mg by mouth at bedtime.     No current facility-administered medications for this visit.     Allergies:   Patient has no known allergies.    Social History:  The patient  reports that he has been smoking cigarettes.  He has a 45.00 pack-year smoking history. His smokeless tobacco use includes snuff. He reports that he drinks about 8.4 oz of alcohol per week. He reports that he does not use drugs.   Family History:  The patient's ***family history is not on file. He was adopted.    ROS:  Please see the history of present illness.   Otherwise, review of systems are positive for {NONE DEFAULTED:18576::"none"}.   All other systems are reviewed and negative.    PHYSICAL EXAM: VS:  There were no vitals taken for this visit. , BMI There is no height or weight on file to calculate BMI. GENERAL:  Well appearing HEENT:  Pupils equal round and reactive,  fundi not visualized, oral mucosa unremarkable NECK:  No jugular venous distention, waveform within normal limits, carotid upstroke brisk and symmetric, no bruits, no thyromegaly LYMPHATICS:  No cervical adenopathy LUNGS:  Clear to auscultation bilaterally HEART:  RRR.  PMI not displaced or sustained,S1 and S2 within normal limits, no S3, no S4, no clicks, no rubs, *** murmurs ABD:  Flat, positive bowel sounds normal in frequency in pitch, no bruits, no rebound, no guarding, no midline pulsatile mass, no hepatomegaly, no splenomegaly EXT:  2 plus pulses throughout, no edema, no cyanosis no clubbing SKIN:  No rashes no nodules NEURO:  Cranial nerves II through XII grossly intact, motor grossly intact throughout PSYCH:  Cognitively intact, oriented to person place and time    EKG:  EKG {ACTION; IS/IS BOF:96924932} ordered today. The ekg ordered today demonstrates ***   Recent Labs: No results  found for requested labs within last 8760 hours.    Lipid Panel    Component Value Date/Time   LDLDIRECT 98 09/07/2011 1418      Wt Readings from Last 3 Encounters:  02/28/15 240 lb (108.9 kg)  11/15/14 241 lb (109.3 kg)  10/16/11 232 lb (105.2 kg)      ASSESSMENT AND PLAN:  ***   Current medicines are reviewed at length with the patient today.  The patient {ACTIONS; HAS/DOES NOT HAVE:19233} concerns regarding medicines.  The following changes have been made:  {PLAN; NO CHANGE:13088:s}  Labs/ tests ordered today include: *** No orders of the defined types were placed in this encounter.    Disposition:   FU with ***    This note was written with the assistance of speech recognition software.  Please excuse any transcriptional errors.  Signed, Shivansh Hardaway C. Oval Linsey, MD, [facility]  08/23/2017 4:01 PM    [facility]

## 2017-08-24 ENCOUNTER — Ambulatory Visit: Payer: Medicare HMO | Admitting: Cardiovascular Disease

## 2017-08-24 ENCOUNTER — Encounter: Payer: Self-pay | Admitting: *Deleted

## 2017-08-24 ENCOUNTER — Other Ambulatory Visit: Payer: Self-pay | Admitting: *Deleted

## 2017-08-24 NOTE — Progress Notes (Signed)
Called [facility] for last OV notes, ekg, and labs By Alvina Filbert for 3:40pm appointment today. Notes received 08/24/17 from [facility].  Notes Abstracted 08/24/17 @ 4:20pm by Marlis Edelson, CMA Patient No Show.  Notes placed in Scan Box for future reference/ appointments.

## 2017-09-20 ENCOUNTER — Emergency Department (HOSPITAL_COMMUNITY): Payer: Medicare HMO

## 2017-09-20 ENCOUNTER — Encounter (HOSPITAL_COMMUNITY): Payer: Self-pay

## 2017-09-20 ENCOUNTER — Inpatient Hospital Stay (HOSPITAL_COMMUNITY)
Admission: EM | Admit: 2017-09-20 | Discharge: 2017-09-28 | DRG: 637 | Disposition: A | Discharge status: Home / Self Care | Payer: Medicare HMO | Attending: Family Medicine | Admitting: Family Medicine

## 2017-09-20 ENCOUNTER — Inpatient Hospital Stay (HOSPITAL_COMMUNITY): Payer: Medicare HMO

## 2017-09-20 ENCOUNTER — Other Ambulatory Visit: Payer: Self-pay

## 2017-09-20 DIAGNOSIS — Z7982 Long term (current) use of aspirin: Secondary | ICD-10-CM

## 2017-09-20 DIAGNOSIS — F17210 Nicotine dependence, cigarettes, uncomplicated: Secondary | ICD-10-CM | POA: Diagnosis present

## 2017-09-20 DIAGNOSIS — K7581 Nonalcoholic steatohepatitis (NASH): Secondary | ICD-10-CM | POA: Diagnosis not present

## 2017-09-20 DIAGNOSIS — I10 Essential (primary) hypertension: Secondary | ICD-10-CM | POA: Diagnosis present

## 2017-09-20 DIAGNOSIS — K8020 Calculus of gallbladder without cholecystitis without obstruction: Secondary | ICD-10-CM | POA: Diagnosis not present

## 2017-09-20 DIAGNOSIS — R739 Hyperglycemia, unspecified: Secondary | ICD-10-CM | POA: Diagnosis not present

## 2017-09-20 DIAGNOSIS — E1165 Type 2 diabetes mellitus with hyperglycemia: Principal | ICD-10-CM | POA: Diagnosis present

## 2017-09-20 DIAGNOSIS — E559 Vitamin D deficiency, unspecified: Secondary | ICD-10-CM | POA: Diagnosis present

## 2017-09-20 DIAGNOSIS — G9341 Metabolic encephalopathy: Secondary | ICD-10-CM | POA: Diagnosis not present

## 2017-09-20 DIAGNOSIS — M797 Fibromyalgia: Secondary | ICD-10-CM | POA: Diagnosis present

## 2017-09-20 DIAGNOSIS — E7251 Non-ketotic hyperglycinemia: Secondary | ICD-10-CM | POA: Diagnosis not present

## 2017-09-20 DIAGNOSIS — R945 Abnormal results of liver function studies: Secondary | ICD-10-CM

## 2017-09-20 DIAGNOSIS — E876 Hypokalemia: Secondary | ICD-10-CM | POA: Diagnosis not present

## 2017-09-20 DIAGNOSIS — Z79899 Other long term (current) drug therapy: Secondary | ICD-10-CM

## 2017-09-20 DIAGNOSIS — R4182 Altered mental status, unspecified: Secondary | ICD-10-CM | POA: Diagnosis not present

## 2017-09-20 DIAGNOSIS — E861 Hypovolemia: Secondary | ICD-10-CM | POA: Diagnosis present

## 2017-09-20 DIAGNOSIS — J9690 Respiratory failure, unspecified, unspecified whether with hypoxia or hypercapnia: Secondary | ICD-10-CM

## 2017-09-20 DIAGNOSIS — J9600 Acute respiratory failure, unspecified whether with hypoxia or hypercapnia: Secondary | ICD-10-CM | POA: Diagnosis not present

## 2017-09-20 DIAGNOSIS — F1010 Alcohol abuse, uncomplicated: Secondary | ICD-10-CM | POA: Diagnosis present

## 2017-09-20 DIAGNOSIS — E669 Obesity, unspecified: Secondary | ICD-10-CM | POA: Diagnosis present

## 2017-09-20 DIAGNOSIS — K760 Fatty (change of) liver, not elsewhere classified: Secondary | ICD-10-CM | POA: Diagnosis not present

## 2017-09-20 DIAGNOSIS — Z781 Physical restraint status: Secondary | ICD-10-CM

## 2017-09-20 DIAGNOSIS — Z6833 Body mass index (BMI) 33.0-33.9, adult: Secondary | ICD-10-CM | POA: Diagnosis not present

## 2017-09-20 DIAGNOSIS — K219 Gastro-esophageal reflux disease without esophagitis: Secondary | ICD-10-CM | POA: Diagnosis present

## 2017-09-20 DIAGNOSIS — J449 Chronic obstructive pulmonary disease, unspecified: Secondary | ICD-10-CM | POA: Diagnosis present

## 2017-09-20 DIAGNOSIS — F13239 Sedative, hypnotic or anxiolytic dependence with withdrawal, unspecified: Secondary | ICD-10-CM | POA: Diagnosis present

## 2017-09-20 DIAGNOSIS — R29818 Other symptoms and signs involving the nervous system: Secondary | ICD-10-CM | POA: Diagnosis not present

## 2017-09-20 DIAGNOSIS — F319 Bipolar disorder, unspecified: Secondary | ICD-10-CM | POA: Diagnosis present

## 2017-09-20 DIAGNOSIS — Z23 Encounter for immunization: Secondary | ICD-10-CM | POA: Diagnosis not present

## 2017-09-20 DIAGNOSIS — J189 Pneumonia, unspecified organism: Secondary | ICD-10-CM | POA: Diagnosis present

## 2017-09-20 DIAGNOSIS — J9601 Acute respiratory failure with hypoxia: Secondary | ICD-10-CM

## 2017-09-20 DIAGNOSIS — F22 Delusional disorders: Secondary | ICD-10-CM | POA: Diagnosis present

## 2017-09-20 DIAGNOSIS — R140 Abdominal distension (gaseous): Secondary | ICD-10-CM

## 2017-09-20 DIAGNOSIS — E1100 Type 2 diabetes mellitus with hyperosmolarity without nonketotic hyperglycemic-hyperosmolar coma (NKHHC): Secondary | ICD-10-CM | POA: Diagnosis present

## 2017-09-20 DIAGNOSIS — E119 Type 2 diabetes mellitus without complications: Secondary | ICD-10-CM | POA: Diagnosis not present

## 2017-09-20 DIAGNOSIS — R410 Disorientation, unspecified: Secondary | ICD-10-CM | POA: Diagnosis not present

## 2017-09-20 DIAGNOSIS — E785 Hyperlipidemia, unspecified: Secondary | ICD-10-CM | POA: Diagnosis present

## 2017-09-20 DIAGNOSIS — E870 Hyperosmolality and hypernatremia: Secondary | ICD-10-CM | POA: Diagnosis present

## 2017-09-20 DIAGNOSIS — R7989 Other specified abnormal findings of blood chemistry: Secondary | ICD-10-CM

## 2017-09-20 DIAGNOSIS — R1110 Vomiting, unspecified: Secondary | ICD-10-CM | POA: Diagnosis not present

## 2017-09-20 DIAGNOSIS — N179 Acute kidney failure, unspecified: Secondary | ICD-10-CM | POA: Diagnosis present

## 2017-09-20 DIAGNOSIS — Z91030 Bee allergy status: Secondary | ICD-10-CM

## 2017-09-20 DIAGNOSIS — R0902 Hypoxemia: Secondary | ICD-10-CM

## 2017-09-20 DIAGNOSIS — I959 Hypotension, unspecified: Secondary | ICD-10-CM | POA: Diagnosis not present

## 2017-09-20 LAB — CBC
HEMATOCRIT: 47 % (ref 39.0–52.0)
HEMATOCRIT: 45.1 % (ref 39.0–52.0)
Hemoglobin: 15.7 g/dL (ref 13.0–17.0)
Hemoglobin: 16 g/dL (ref 13.0–17.0)
MCH: 33.3 pg (ref 26.0–34.0)
MCH: 33.2 pg (ref 26.0–34.0)
MCHC: 33.4 g/dL (ref 30.0–36.0)
MCHC: 35.5 g/dL (ref 30.0–36.0)
MCV: 99.6 fL (ref 78.0–100.0)
MCV: 93.6 fL (ref 78.0–100.0)
PLATELETS: 296 10*3/uL (ref 150–400)
Platelets: 348 10*3/uL (ref 150–400)
RBC: 4.72 MIL/uL (ref 4.22–5.81)
RBC: 4.82 MIL/uL (ref 4.22–5.81)
RDW: 12.4 % (ref 11.5–15.5)
RDW: 11.8 % (ref 11.5–15.5)
WBC: 12.9 10*3/uL — ABNORMAL HIGH (ref 4.0–10.5)
WBC: 21.5 10*3/uL — AB (ref 4.0–10.5)

## 2017-09-20 LAB — CBG MONITORING, ED
GLUCOSE-CAPILLARY: 462 mg/dL — AB (ref 65–99)
Glucose-Capillary: >600 mg/dL (ref 65–99)
Glucose-Capillary: >600 mg/dL (ref 65–99)
Glucose-Capillary: >600 mg/dL (ref 65–99)

## 2017-09-20 LAB — I-STAT TROPONIN, ED
Troponin i, poc: 0 ng/mL (ref 0.00–0.08)
Troponin i, poc: 0.01 ng/mL (ref 0.00–0.08)

## 2017-09-20 LAB — BASIC METABOLIC PANEL
ANION GAP: 16 — AB (ref 5–15)
BUN: 22 mg/dL — ABNORMAL HIGH (ref 6–20)
CHLORIDE: 105 mmol/L (ref 101–111)
CO2: 30 mmol/L (ref 22–32)
Calcium: 12.4 mg/dL — ABNORMAL HIGH (ref 8.9–10.3)
Creatinine, Ser: 1.4 mg/dL — ABNORMAL HIGH (ref 0.61–1.24)
GFR calc non Af Amer: 55 mL/min — ABNORMAL LOW (ref >60)
Glucose, Bld: 345 mg/dL — ABNORMAL HIGH (ref 65–99)
POTASSIUM: 3.6 mmol/L (ref 3.5–5.1)
SODIUM: 151 mmol/L — AB (ref 135–145)

## 2017-09-20 LAB — DIFFERENTIAL
BASOS PCT: 0 %
Basophils Absolute: 0 10*3/uL (ref 0.0–0.1)
EOS ABS: 0 10*3/uL (ref 0.0–0.7)
Eosinophils Relative: 0 %
Lymphocytes Relative: 15 %
Lymphs Abs: 1.9 10*3/uL (ref 0.7–4.0)
MONO ABS: 0.6 10*3/uL (ref 0.1–1.0)
MONOS PCT: 5 %
Neutro Abs: 10.4 10*3/uL — ABNORMAL HIGH (ref 1.7–7.7)
Neutrophils Relative %: 80 %

## 2017-09-20 LAB — COMPREHENSIVE METABOLIC PANEL
ALT: 294 U/L — ABNORMAL HIGH (ref 17–63)
AST: 211 U/L — AB (ref 15–41)
Albumin: 4.8 g/dL (ref 3.5–5.0)
Alkaline Phosphatase: 140 U/L — ABNORMAL HIGH (ref 38–126)
Anion gap: 20 — ABNORMAL HIGH (ref 5–15)
BUN: 21 mg/dL — ABNORMAL HIGH (ref 6–20)
CHLORIDE: 86 mmol/L — AB (ref 101–111)
CO2: 24 mmol/L (ref 22–32)
Calcium: 11.9 mg/dL — ABNORMAL HIGH (ref 8.9–10.3)
Creatinine, Ser: 1.45 mg/dL — ABNORMAL HIGH (ref 0.61–1.24)
GFR, EST NON AFRICAN AMERICAN: 52 mL/min — AB (ref >60)
Glucose, Bld: 1212 mg/dL (ref 65–99)
POTASSIUM: 4.7 mmol/L (ref 3.5–5.1)
Sodium: 130 mmol/L — ABNORMAL LOW (ref 135–145)
Total Bilirubin: 1.4 mg/dL — ABNORMAL HIGH (ref 0.3–1.2)
Total Protein: 8.3 g/dL — ABNORMAL HIGH (ref 6.5–8.1)

## 2017-09-20 LAB — I-STAT ARTERIAL BLOOD GAS, ED
ACID-BASE EXCESS: 5 mmol/L — AB (ref 0.0–2.0)
Bicarbonate: 30.3 mmol/L — ABNORMAL HIGH (ref 20.0–28.0)
O2 Saturation: 90 %
PH ART: 7.422 (ref 7.350–7.450)
TCO2: 32 mmol/L (ref 22–32)
pCO2 arterial: 46.5 mmHg (ref 32.0–48.0)
pO2, Arterial: 59 mmHg — ABNORMAL LOW (ref 83.0–108.0)

## 2017-09-20 LAB — URINALYSIS, ROUTINE W REFLEX MICROSCOPIC
Bacteria, UA: NONE SEEN
Bilirubin Urine: NEGATIVE
Ketones, ur: NEGATIVE mg/dL
LEUKOCYTES UA: NEGATIVE
Nitrite: NEGATIVE
PH: 7 (ref 5.0–8.0)
PROTEIN: NEGATIVE mg/dL
Specific Gravity, Urine: 1.024 (ref 1.005–1.030)

## 2017-09-20 LAB — AMYLASE: Amylase: 26 U/L — ABNORMAL LOW (ref 28–100)

## 2017-09-20 LAB — I-STAT CHEM 8, ED
BUN: 24 mg/dL — ABNORMAL HIGH (ref 6–20)
CALCIUM ION: 1.3 mmol/L (ref 1.15–1.40)
Chloride: 90 mmol/L — ABNORMAL LOW (ref 101–111)
Creatinine, Ser: 1.1 mg/dL (ref 0.61–1.24)
Glucose, Bld: >700 mg/dL (ref 65–99)
HEMATOCRIT: 48 % (ref 39.0–52.0)
Hemoglobin: 16.3 g/dL (ref 13.0–17.0)
Potassium: 4.7 mmol/L (ref 3.5–5.1)
SODIUM: 130 mmol/L — AB (ref 135–145)
TCO2: 30 mmol/L (ref 22–32)

## 2017-09-20 LAB — MAGNESIUM: MAGNESIUM: 2.7 mg/dL — AB (ref 1.7–2.4)

## 2017-09-20 LAB — RAPID URINE DRUG SCREEN, HOSP PERFORMED
Amphetamines: NOT DETECTED
BARBITURATES: NOT DETECTED
Benzodiazepines: POSITIVE — AB
Cocaine: NOT DETECTED
OPIATES: NOT DETECTED
Tetrahydrocannabinol: NOT DETECTED

## 2017-09-20 LAB — GLUCOSE, CAPILLARY
GLUCOSE-CAPILLARY: 390 mg/dL — AB (ref 65–99)
Glucose-Capillary: 268 mg/dL — ABNORMAL HIGH (ref 65–99)
Glucose-Capillary: 205 mg/dL — ABNORMAL HIGH (ref 65–99)

## 2017-09-20 LAB — LIPASE, BLOOD: LIPASE: 39 U/L (ref 11–51)

## 2017-09-20 LAB — CREATININE, SERUM
CREATININE: 1.5 mg/dL — AB (ref 0.61–1.24)
GFR calc Af Amer: 58 mL/min — ABNORMAL LOW (ref >60)
GFR calc non Af Amer: 50 mL/min — ABNORMAL LOW (ref >60)

## 2017-09-20 LAB — PHOSPHORUS: PHOSPHORUS: 1.1 mg/dL — AB (ref 2.5–4.6)

## 2017-09-20 LAB — PROCALCITONIN: PROCALCITONIN: 0.4 ng/mL

## 2017-09-20 LAB — OSMOLALITY: Osmolality: 354 mosm/kg (ref 275–295)

## 2017-09-20 LAB — PROTIME-INR
INR: 1.03
PROTHROMBIN TIME: 13.4 s (ref 11.4–15.2)

## 2017-09-20 LAB — MRSA PCR SCREENING: MRSA BY PCR: NEGATIVE

## 2017-09-20 LAB — ETHANOL: Alcohol, Ethyl (B): <10 mg/dL (ref <10)

## 2017-09-20 LAB — AMMONIA: AMMONIA: 63 umol/L — AB (ref 9–35)

## 2017-09-20 LAB — APTT: aPTT: 27 seconds (ref 24–36)

## 2017-09-20 MED ORDER — POTASSIUM PHOSPHATES 15 MMOLE/5ML IV SOLN
30.0000 mmol | Freq: Once | INTRAVENOUS | Status: AC
  Administered 2017-09-21: 30 mmol via INTRAVENOUS
  Filled 2017-09-20: qty 10

## 2017-09-20 MED ORDER — LORAZEPAM 2 MG/ML IJ SOLN
0.5000 mg | Freq: Once | INTRAMUSCULAR | Status: AC
Start: 2017-09-20 — End: 2017-09-20
  Administered 2017-09-20: 2 mg via INTRAVENOUS
  Filled 2017-09-20: qty 1

## 2017-09-20 MED ORDER — DEXMEDETOMIDINE HCL IN NACL 400 MCG/100ML IV SOLN
0.4000 ug/kg/h | INTRAVENOUS | Status: DC
  Administered 2017-09-20: 0.6 ug/kg/h via INTRAVENOUS
  Administered 2017-09-21: 0.8 ug/kg/h via INTRAVENOUS
  Administered 2017-09-21: 0.6 ug/kg/h via INTRAVENOUS
  Administered 2017-09-21: 0.5 ug/kg/h via INTRAVENOUS
  Administered 2017-09-21: 0.6 ug/kg/h via INTRAVENOUS
  Administered 2017-09-22: 1 ug/kg/h via INTRAVENOUS
  Administered 2017-09-22: 0.6 ug/kg/h via INTRAVENOUS
  Administered 2017-09-22: 1 ug/kg/h via INTRAVENOUS
  Administered 2017-09-22: 0.4 ug/kg/h via INTRAVENOUS
  Administered 2017-09-22: 0.8 ug/kg/h via INTRAVENOUS
  Filled 2017-09-20 (×11): qty 100

## 2017-09-20 MED ORDER — THIAMINE HCL 100 MG/ML IJ SOLN
100.0000 mg | Freq: Every day | INTRAMUSCULAR | Status: DC
  Administered 2017-09-20 – 2017-09-23 (×4): 100 mg via INTRAVENOUS
  Filled 2017-09-20 (×4): qty 1

## 2017-09-20 MED ORDER — SODIUM CHLORIDE 0.9 % IV SOLN
INTRAVENOUS | Status: DC
  Administered 2017-09-20: 5.4 [IU]/h via INTRAVENOUS
  Administered 2017-09-21: 3 [IU]/h via INTRAVENOUS
  Filled 2017-09-20 (×2): qty 1

## 2017-09-20 MED ORDER — SODIUM CHLORIDE 0.9 % IV SOLN: INTRAVENOUS | Status: DC

## 2017-09-20 MED ORDER — ONDANSETRON HCL 4 MG/2ML IJ SOLN: 4.0000 mg | Freq: Four times a day (QID) | INTRAMUSCULAR | Status: DC | PRN

## 2017-09-20 MED ORDER — LACTULOSE 10 GM/15ML PO SOLN
30.0000 g | Freq: Three times a day (TID) | ORAL | Status: DC
  Filled 2017-09-20: qty 45

## 2017-09-20 MED ORDER — SODIUM CHLORIDE 0.9 % IV SOLN
INTRAVENOUS | Status: AC
  Administered 2017-09-20: 22:00:00 via INTRAVENOUS

## 2017-09-20 MED ORDER — ACETAMINOPHEN 325 MG PO TABS: 650.0000 mg | ORAL_TABLET | ORAL | Status: DC | PRN

## 2017-09-20 MED ORDER — FENTANYL CITRATE (PF) 100 MCG/2ML IJ SOLN
50.0000 ug | INTRAMUSCULAR | Status: DC | PRN
  Administered 2017-09-21 – 2017-09-22 (×2): 50 ug via INTRAVENOUS
  Filled 2017-09-20 (×2): qty 2

## 2017-09-20 MED ORDER — SODIUM CHLORIDE 0.9 % IV SOLN: 250.0000 mL | INTRAVENOUS | Status: DC | PRN

## 2017-09-20 MED ORDER — POTASSIUM CHLORIDE 10 MEQ/100ML IV SOLN
10.0000 meq | INTRAVENOUS | Status: AC
  Administered 2017-09-20 (×2): 10 meq via INTRAVENOUS
  Filled 2017-09-20 (×2): qty 100

## 2017-09-20 MED ORDER — POTASSIUM CHLORIDE 10 MEQ/100ML IV SOLN
10.0000 meq | INTRAVENOUS | Status: AC
  Administered 2017-09-20 – 2017-09-21 (×2): 10 meq via INTRAVENOUS
  Filled 2017-09-20 (×2): qty 100

## 2017-09-20 MED ORDER — DEXMEDETOMIDINE HCL 200 MCG/2ML IV SOLN
0.4000 ug/kg/h | INTRAVENOUS | Status: DC
  Administered 2017-09-20: 0.4 ug/kg/h via INTRAVENOUS
  Filled 2017-09-20 (×2): qty 2

## 2017-09-20 MED ORDER — SODIUM CHLORIDE 0.9 % IV BOLUS (SEPSIS)
1000.0000 mL | Freq: Once | INTRAVENOUS | Status: AC
  Administered 2017-09-20: 1000 mL via INTRAVENOUS

## 2017-09-20 MED ORDER — SODIUM CHLORIDE 0.9 % IV SOLN
2.0000 g | Freq: Two times a day (BID) | INTRAVENOUS | Status: DC
  Administered 2017-09-20 – 2017-09-22 (×4): 2 g via INTRAVENOUS
  Filled 2017-09-20 (×4): qty 20

## 2017-09-20 MED ORDER — CIPROFLOXACIN IN D5W 400 MG/200ML IV SOLN
400.0000 mg | Freq: Two times a day (BID) | INTRAVENOUS | Status: DC
  Administered 2017-09-21 (×2): 400 mg via INTRAVENOUS
  Filled 2017-09-20 (×2): qty 200

## 2017-09-20 MED ORDER — HEPARIN SODIUM (PORCINE) 5000 UNIT/ML IJ SOLN
5000.0000 [IU] | Freq: Three times a day (TID) | INTRAMUSCULAR | Status: DC
  Administered 2017-09-20 – 2017-09-27 (×21): 5000 [IU] via SUBCUTANEOUS
  Filled 2017-09-20 (×21): qty 1

## 2017-09-20 MED ORDER — LORAZEPAM 2 MG/ML IJ SOLN
2.0000 mg | Freq: Once | INTRAMUSCULAR | Status: AC
Start: 2017-09-20 — End: 2017-09-20
  Administered 2017-09-20: 2 mg via INTRAVENOUS
  Filled 2017-09-20: qty 1

## 2017-09-20 MED ORDER — DEXTROSE-NACL 5-0.45 % IV SOLN
INTRAVENOUS | Status: DC
  Administered 2017-09-20: 75 mL via INTRAVENOUS
  Administered 2017-09-21: 11:00:00 via INTRAVENOUS

## 2017-09-20 MED ORDER — LORAZEPAM 2 MG/ML IJ SOLN
1.0000 mg | Freq: Once | INTRAMUSCULAR | Status: AC
  Administered 2017-09-20: 1 mg via INTRAVENOUS
  Filled 2017-09-20: qty 1

## 2017-09-20 MED ORDER — VANCOMYCIN HCL IN DEXTROSE 1-5 GM/200ML-% IV SOLN
1000.0000 mg | Freq: Two times a day (BID) | INTRAVENOUS | Status: DC
  Administered 2017-09-21: 1000 mg via INTRAVENOUS
  Filled 2017-09-20: qty 200

## 2017-09-20 MED ORDER — PANTOPRAZOLE SODIUM 40 MG IV SOLR
40.0000 mg | Freq: Every day | INTRAVENOUS | Status: DC
  Administered 2017-09-20 – 2017-09-22 (×3): 40 mg via INTRAVENOUS
  Filled 2017-09-20 (×3): qty 40

## 2017-09-20 MED ORDER — QUETIAPINE FUMARATE 50 MG PO TABS
200.0000 mg | ORAL_TABLET | Freq: Every day | ORAL | Status: DC
  Administered 2017-09-22 – 2017-09-27 (×6): 200 mg via ORAL
  Filled 2017-09-20: qty 1
  Filled 2017-09-20 (×3): qty 4
  Filled 2017-09-20: qty 1
  Filled 2017-09-20: qty 4
  Filled 2017-09-20: qty 1
  Filled 2017-09-20: qty 4
  Filled 2017-09-20: qty 1

## 2017-09-20 MED ORDER — VANCOMYCIN HCL 10 G IV SOLR
2000.0000 mg | Freq: Once | INTRAVENOUS | Status: AC
  Administered 2017-09-21: 2000 mg via INTRAVENOUS
  Filled 2017-09-20: qty 2000

## 2017-09-20 NOTE — Code Documentation (Signed)
57 year old male presents to [facility] via pvt vehicle with confusion and agitation.  Family states he was LSW at 57 when he began to have emesis - post emesis he was confused.  In ED he was noted to follow commands but less on the left side - agitated and confused.  Blood sugar too high to read >700.  Code stroke was called secondary to decreased ability to follow commands on left side.  In CT scan patient was agitated - strong movements all 4's - intermittently following commands on both sides - perservating about needing to void - difficulty getting patient still enough for scan - able to scan well enough to rule out bleed.  No focal deficits noted - Dr. Cheral Marker present - code stroke cancelled - patient appears encephalopathic.  Handoff to [employer].

## 2017-09-20 NOTE — ED Notes (Signed)
Pt's CBG result read - "HI". Informed Tray - RN.

## 2017-09-20 NOTE — Progress Notes (Signed)
[place] Progress Note Patient Name: Jesus Martin DOB: [birth year] MRN: [id number]   Date of Service  09/20/2017  HPI/Events of Note  Multiple issues: 1. Fever to 101.1 F - AST and ALT both elevated. and 2. Request for bilateral soft wrist and ankle restraints.   eICU Interventions  Will order: 1. D/C Tylenol. 2. Ice packs PRN for fever. 3. Bilateral soft wrist and ankle restraints.      Intervention Category Major Interventions: Other:;Delirium, psychosis, severe agitation - evaluation and management  Lysle Dingwall 09/20/2017, 8:32 PM

## 2017-09-20 NOTE — Progress Notes (Signed)
Pharmacy Antibiotic Note  Jesus Martin is a 57 y.o. male admitted on 09/20/2017 with possible CNS infection.  Pharmacy has been consulted for vancomycin dosing. Renal function wnl.  Vancomycin trough goal 15-20  Plan: 1) Vancomycin 2g IV x 1 then 1g IV q12 2) Follow renal function, cultures, LOT, level if needed  Height: 6' 1"  (185.4 cm) Weight: 239 lb 6.7 oz (108.6 kg) IBW/kg (Calculated) : 79.9  Temp (24hrs), Avg:100.6 F (38.1 C), Min:99 F (37.2 C), Max:101.7 F (38.7 C)  Recent Labs  Lab 09/20/17 1449 09/20/17 1456  WBC 12.9*  --   CREATININE 1.45* 1.10    Estimated Creatinine Clearance: 96.9 mL/min (by C-G formula based on SCr of 1.1 mg/dL).    Allergies  Allergen Reactions  . Yellow [place]et Venom [Bee Venom] Anaphylaxis and Other (See Comments)    And the patient passes out    Antimicrobials this admission: 2/11 Vancomycin >> 2/11 Ceftriaxone >> 2/11 Cipro >>  Dose adjustments this admission: n/a  Microbiology results: 2/11 blood x2 >>  Thank you for allowing pharmacy to be a part of this patient's care.  Deboraha Sprang 09/20/2017 7:48 PM

## 2017-09-20 NOTE — ED Notes (Signed)
Pt's CBG result was 462. Informed Tray - RN.

## 2017-09-20 NOTE — Consult Note (Signed)
Requesting Physician: Dr. Alvino Chapel    Chief Complaint: AMS-code stroke  History obtained from:  Nurse and ED MD  HPI:                                                                                                                                         Jesus Martin is an 57 y.o. male Last seen normal at 58 AM when he had a episode of emesis and then was AMS. Came to ED where his BG was so high the meter could not read the level. HE was stated to be weak all over and per MD not followng commands on left. Patient was brought to CT where he was confused and agitated. HE was intermittently following commands. And moving all extremities. --encephalopathic picture.   Date last known well: Date: 09/20/2017 Time last known well: Time: 11:00 tPA Given: No: no focality and encephalopathic picture  Modified Rankin: Rankin Score=0   Past Medical History:  Diagnosis Date  . Bipolar I disorder ([facility]) 1991   Dr. Toy Care  . Borderline hyperlipidemia    history of  . Childhood asthma   . COPD (chronic obstructive pulmonary disease) ([place])   . Elevated glucose   . Fibromyalgia   . GERD (gastroesophageal reflux disease)   . HTN (hypertension)   . Lower back pain   . Paranoia ([place])   . Tobacco abuse   . Tobacco abuse   . Vitamin D deficiency     Past Surgical History:  Procedure Laterality Date  . APPENDECTOMY     1975  . BACK SURGERY     1998  . NOSE SURGERY      Family History  Adopted: Yes   Social History:  reports that he has been smoking cigarettes.  He has a 45.00 pack-year smoking history. His smokeless tobacco use includes snuff. He reports that he drinks about 8.4 oz of alcohol per week. He reports that he does not use drugs.  Allergies: No Known Allergies  Medications:                                                                                                                           No current facility-administered medications for this encounter.    Current  Outpatient Medications  Medication Sig Dispense Refill  . amLODipine (NORVASC) 5 MG tablet Take 5 mg by  mouth daily.      Marland Kitchen aspirin EC 81 MG tablet Take 81 mg by mouth daily.    Marland Kitchen EPINEPHrine (EPIPEN 2-PAK IJ) Inject as directed as directed.    . lamoTRIgine (LAMICTAL) 150 MG tablet Take 150 mg by mouth 2 (two) times daily.    Marland Kitchen lisinopril-hydrochlorothiazide (PRINZIDE,ZESTORETIC) 20-12.5 MG per tablet Take 1 tablet by mouth daily.      Marland Kitchen lithium carbonate (ESKALITH) 450 MG CR tablet Take 450 mg by mouth 2 (two) times daily. 1-2x per day      . LORazepam (ATIVAN) 2 MG tablet Take 2 mg by mouth daily as needed for anxiety.    . MULTIPLE VITAMIN PO Take 1 tablet by mouth daily.    . Omega-3 1000 MG CAPS Take 1 capsule by mouth at bedtime.    Marland Kitchen omeprazole (PRILOSEC) 40 MG capsule Take 40 mg by mouth daily.    . ondansetron (ZOFRAN) 4 MG tablet Take 4 mg by mouth every 8 (eight) hours as needed for nausea or vomiting.    Marland Kitchen QUEtiapine (SEROQUEL) 50 MG tablet Take 200 mg by mouth at bedtime.    . traMADol (ULTRAM) 50 MG tablet Take 50 mg by mouth 2 (two) times daily as needed.    . TRELEGY ELLIPTA 100-62.5-25 MCG/INH AEPB Inhale 1 puff into the lungs daily.    . VENTOLIN HFA 108 (90 Base) MCG/ACT inhaler Inhale 2 puffs into the lungs every 6 (six) hours as needed.       ROS:                                                                                                                                       History obtained from unobtainable from patient due to mental status    General Examination:                                                                                                      Weight 108.6 kg (239 lb 6.7 oz).  HEENT-  Normocephalic, no lesions, without obvious abnormality.  Normal external eye and conjunctiva.   Cardiovascular- S1-S2 audible, pulses palpable throughout   Lungs-no rhonchi or wheezing noted, no excessive working breathing.  Saturations within normal  limits Abdomen- All 4 quadrants palpated and nontender Extremities- Warm, dry and intact Musculoskeletal-no joint tenderness, deformity or swelling Skin-warm and dry, no hyperpigmentation, vitiligo, or suspicious lesions  Neurological Examination Mental Status: Alert, agitated and confused.  Speech fluent without evidence of  aphasia.  Unable to follow commands due to confusion and perseveration on needing to urinate.  Cranial Nerves: II: no blink to threat  III,IV, VI: ptosis not present,doll's intact, pupils equal, round, reactive to light and accommodation V,VII: face symmetric, facial light touch sensation normal bilaterally VIII: hearing normal bilaterally IX,X: uvula rises symmetrically XI: bilateral shoulder shrug XII: midline tongue extension Motor: Right : Upper extremity   5/5    Left:     Upper extremity   5/5  Lower extremity   5/5     Lower extremity   5/5 Tone and bulk:normal tone throughout; no atrophy noted Sensory: withdraws from noxious stimuli in all extremities Deep Tendon Reflexes: 2+ and symmetric throughout Plantars: Right: downgoing   Left: downgoing    Lab Results: Basic Metabolic Panel: Recent Labs  Lab 09/20/17 1456  NA 130*  K 4.7  CL 90*  GLUCOSE >700*  BUN 24*  CREATININE 1.10    CBC: Recent Labs  Lab 09/20/17 1449 09/20/17 1456  WBC 12.9*  --   NEUTROABS 10.4*  --   HGB 15.7 16.3  HCT 47.0 48.0  MCV 99.6  --   PLT 296  --     Lipid Panel: No results for input(s): CHOL, TRIG, HDL, CHOLHDL, VLDL, LDLCALC in the last 168 hours.  CBG: Recent Labs  Lab 09/20/17 1450  GLUCAP >600*    Imaging: No results found.  Assessment and plan discussed with with attending physician and they are in agreement.    Etta Quill PA-C [facility] [phone]  09/20/2017, 3:33 PM   Assessment: 57 y.o. male presenting to hospital with hyperglycemia, confusion, agitation and weakness in the setting of glucose too elevated for  machine to register. Overall clinical picture is most consistent with a metabolic encephalopathy Stroke Risk Factors - hypertension and smoking  Recommendations: MRI brain when able.  Control BG and metabolic abnormalities.   I have seen and examined the patient. Assessment and Recommendations discussed with Etta Quill, PA-C Electronically signed: Dr. Kerney Elbe

## 2017-09-20 NOTE — ED Provider Notes (Signed)
MSE was initiated and I personally evaluated the patient and placed orders (if any) at  2:53 PM on September 20, 2017.  The patient appears stable so that the remainder of the MSE may be completed by another provider.  Seen at the bridge.  Altered mentalstatus with confusion.  Hyperglycemic upon arrival with no history of diabetes.  Will tell me his name but will otherwise squeeze on the right side but no commands on the left side.  However will use the left arm to help lift himself up.  Does have neglect of the left side but will look to the voice on right side.   Davonna Belling, MD 09/20/17 1454

## 2017-09-20 NOTE — Progress Notes (Signed)
[place] Progress Note Patient Name: OMIR COOPRIDER DOB: [birth year] MRN: [id number]   Date of Service  09/20/2017  HPI/Events of Note  Given delirium and elevated AST and ALT will check Ammonia level.  eICU Interventions  Will order: 1. Ammonia level STAT.     Intervention Category Major Interventions: Delirium, psychosis, severe agitation - evaluation and management  Finnian Husted Eugene 09/20/2017, 9:37 PM

## 2017-09-20 NOTE — ED Triage Notes (Signed)
Per Family, Pt is coming from home with last seen normal at 1100. Pt was at home with his wife when he started to actively vomit. Family reports that he vomited several times, but stopped. Ever since the episode of vomiting, pt has been altered with slurred speech and combative. Pt has hx of Bipolar.

## 2017-09-20 NOTE — ED Notes (Signed)
Condom cath placed on pt 

## 2017-09-20 NOTE — H&P (Signed)
PULMONARY / CRITICAL CARE MEDICINE   Name: Jesus Martin MRN: [id number] DOB: [birth year]    ADMISSION DATE:  09/20/2017   CHIEF COMPLAINT: Altered mental status  HISTORY OF PRESENT ILLNESS:        This is a previously undiagnosed diabetic with a history of COPD and bipolar disorder who has not felt well for the past week.  He has been suffering from a "upset stomach" for the past week has had very little p.o. intake other than immense amounts of liquids.  He has been having liquid stool up until today.  He is also been having rigors at home.  He has not had any associated cough or more than his usual shortness of breath.  On arrival in the emergency room mental status was altered and he was not able to give any history.  During my examination he is very very agitated and history is obtained entirely from his wife.  PAST MEDICAL HISTORY :  He  has a past medical history of Bipolar I disorder ([place]) (1991), Borderline hyperlipidemia, Childhood asthma, COPD (chronic obstructive pulmonary disease) ([place]), Elevated glucose, Fibromyalgia, GERD (gastroesophageal reflux disease), HTN (hypertension), Lower back pain, Paranoia ([place]), Tobacco abuse, Tobacco abuse, and Vitamin D deficiency.  PAST SURGICAL HISTORY: He  has a past surgical history that includes Appendectomy; Back surgery; and Nose surgery.  No Known Allergies  No current facility-administered medications on file prior to encounter.    Current Outpatient Medications on File Prior to Encounter  Medication Sig  . amLODipine (NORVASC) 5 MG tablet Take 5 mg by mouth daily.    Marland Kitchen aspirin EC 81 MG tablet Take 81 mg by mouth daily.  Marland Kitchen EPINEPHrine (EPIPEN 2-PAK IJ) Inject as directed as directed.  . lamoTRIgine (LAMICTAL) 150 MG tablet Take 150 mg by mouth 2 (two) times daily.  Marland Kitchen lisinopril-hydrochlorothiazide (PRINZIDE,ZESTORETIC) 20-12.5 MG per tablet Take 1 tablet by mouth daily.    Marland Kitchen lithium carbonate (ESKALITH) 450 MG CR tablet Take 450  mg by mouth 2 (two) times daily. 1-2x per day    . LORazepam (ATIVAN) 2 MG tablet Take 2 mg by mouth daily as needed for anxiety.  . MULTIPLE VITAMIN PO Take 1 tablet by mouth daily.  . Omega-3 1000 MG CAPS Take 1 capsule by mouth at bedtime.  Marland Kitchen omeprazole (PRILOSEC) 40 MG capsule Take 40 mg by mouth daily.  . ondansetron (ZOFRAN) 4 MG tablet Take 4 mg by mouth every 8 (eight) hours as needed for nausea or vomiting.  Marland Kitchen QUEtiapine (SEROQUEL) 50 MG tablet Take 200 mg by mouth at bedtime.  . traMADol (ULTRAM) 50 MG tablet Take 50 mg by mouth 2 (two) times daily as needed.  . TRELEGY ELLIPTA 100-62.5-25 MCG/INH AEPB Inhale 1 puff into the lungs daily.  . VENTOLIN HFA 108 (90 Base) MCG/ACT inhaler Inhale 2 puffs into the lungs every 6 (six) hours as needed.    FAMILY HISTORY:  His is adopted.    SOCIAL HISTORY: He  reports that he has been smoking cigarettes.  He has a 45.00 pack-year smoking history. His smokeless tobacco use includes snuff. He reports that he drinks about 8.4 oz of alcohol per week. He reports that he does not use drugs.  REVIEW OF SYSTEMS:       He has chronic baseline dyspnea which limits his activity but he is able to do his ADLs and can shop without getting short of breath.  His activity is more limited by paranoia, and he does  not like to be around people.  His bipolar disorder is otherwise reportedly well controlled on his current regimen.  Going back 2 weeks prior to this admission the wife states that he was in his usual state of health.  SUBJECTIVE:  Essentially unobtainable  VITAL SIGNS: BP 140/84   Pulse (!) 133   Temp (!) 101.7 F (38.7 C) (Axillary)   Resp 19   Wt 239 lb 6.7 oz (108.6 kg)   SpO2 94%   BMI 31.59 kg/m   HEMODYNAMICS:    VENTILATOR SETTINGS:    INTAKE / OUTPUT: No intake/output data recorded.  PHYSICAL EXAMINATION: General: He is overtly delirious thrashing about on the stretcher and moving all fours vigorously.  He does not  respond to any verbal cues.   Neuro: He has no meningismus.  There is no response to voice.  Pupils are equal and reactive.  The face is symmetric.  He moves all fours vigorously. Cardiovascular: S1 and S2 are rapid and regular.  There is a 3 out of 6 systolic ejection murmur.  No rub or gallop. Lungs: He is somewhat tachypneic, there is symmetric air movement and some scattered rales.  No wheezes.  There is good air movement throughout. Abdomen: The abdomen is obese and soft without any organomegaly masses or tenderness specifically there is no right upper quadrant tenderness. Musculoskeletal: He does not have dependent edema.  LABS:  BMET Recent Labs  Lab 09/20/17 1449 09/20/17 1456  NA 130* 130*  K 4.7 4.7  CL 86* 90*  CO2 24  --   BUN 21* 24*  CREATININE 1.45* 1.10  GLUCOSE 1,212* >700*    Electrolytes Recent Labs  Lab 09/20/17 1449  CALCIUM 11.9*    CBC Recent Labs  Lab 09/20/17 1449 09/20/17 1456  WBC 12.9*  --   HGB 15.7 16.3  HCT 47.0 48.0  PLT 296  --     Coag's Recent Labs  Lab 09/20/17 1449  APTT 27  INR 1.03    Sepsis Markers No results for input(s): LATICACIDVEN, PROCALCITON, O2SATVEN in the last 168 hours.  ABG Recent Labs  Lab 09/20/17 1616  PHART 7.422  PCO2ART 46.5  PO2ART 59.0*    Liver Enzymes Recent Labs  Lab 09/20/17 1449  AST 211*  ALT 294*  ALKPHOS 140*  BILITOT 1.4*  ALBUMIN 4.8    Cardiac Enzymes No results for input(s): TROPONINI, PROBNP in the last 168 hours.  Glucose Recent Labs  Lab 09/20/17 1450 09/20/17 1625 09/20/17 1739  GLUCAP >600* >600* >600*    Imaging Ct Head Code Stroke Wo Contrast  Result Date: 09/20/2017 CLINICAL DATA:  Code stroke. Confusion and generalized weakness. Elevated glucose. EXAM: CT HEAD WITHOUT CONTRAST TECHNIQUE: Contiguous axial images were obtained from the base of the skull through the vertex without intravenous contrast. COMPARISON:  None. FINDINGS: Significant motion  degradation. Study is of reduced diagnostic utility. Brain: No evidence of acute infarction, hemorrhage, hydrocephalus, extra-axial collection or mass lesion/mass effect. Premature for age atrophy. Hypoattenuation of white matter, likely small vessel disease. Chronic LEFT frontal infarct. Vascular: No definite hyperattenuating vessels, although sensitivity is diminished. Skull: No definite fracture. Sinuses/Orbits: No significant sinus opacity or orbital findings. Other: None. ASPECTS [facility] Stroke Program Early CT Score) - Ganglionic level infarction (caudate, lentiform nuclei, internal capsule, insula, M1-M3 cortex): Estimated 7 - Supraganglionic infarction (M4-M6 cortex): Estimated 3 Total score (0-10 with 10 being normal): Estimated 10. IMPRESSION: 1. No definite emergent large vessel occlusion or acute cortical infarction. Premature  for age atrophy with chronic microvascular ischemic change. Old LEFT frontal infarct. 2. ASPECTS is estimated 10. These results were communicated to Lindzen MD at 3:37 pmon 2/11/2019by text page via the [facility] messaging system. Electronically Signed   By: Staci Righter M.D.   On: 09/20/2017 15:40     STUDIES:   ANTIBIOTICS: Vancomycin, Cipro, and CNS dose Rocephin initiated on 2/11   DISCUSSION:      This is a previously undiagnosed diabetic who presents with altered mental status and profoundly elevated sugar.  In addition he is febrile with a modest leukocytosis and abnormal LFTs.  Although this largely may represent a first manifestation of diabetes, I am concerned in any acute illness precipitated this hypoglycemic event.  ASSESSMENT / PLAN:  PULMONARY A: Chest x-ray is pending.  In the interim he has been covered with Rocephin and ciprofloxacin to cover community-acquired pathogens.  An influenza PCR is pending especially in light of the prodrome with diarrhea and rigors.  RENAL A: Baseline renal function is unknown, I suspect his creatinine will improve  with hydration.  GASTROINTESTINAL A: He has abnormal LFTs which could represent, fatty liver, or effects from his medication.  I have ordered a right upper quadrant ultrasound to rule out biliary obstruction, and an amylase and lipase are pending as well.   INFECTIOUS A: He is febrile.  I am most suspicious of influenza.  He has been covered both first CNS infection and for common respiratory pathogens.  Should his source of fever remain unexplained after right upper quadrant ultrasound x-ray and influenza PCR are back and LP may be indicated to rule out viral CNS infection specifically HSV.  ENDOCRINE A: Duration has been ordered and Glucomander insulin initiated  NEUROLOGIC A: He is extraordinarily delirious.  I get an alcohol history of only 1-2 beers per night.  I have started him on thiamine and will be using Precedex to control his delirium, liver as noted above if we do not have a clear source of fever after initial evaluation an LP may be indicated.  32 minutes was spent in the care of this patient today  Lars Masson, MD Pulmonary and [place] Pager: [phone]  09/20/2017, 6:40 PM

## 2017-09-20 NOTE — Progress Notes (Addendum)
[place] Progress Note Patient Name: Jesus Martin DOB: 1960/09/22 MRN: [id number]   Date of Service  09/20/2017  HPI/Events of Note  Multiple issues: 1. Elevated Ammonia Level - NH3 level = 63 and 3. PO4--- = 1.1, K+ = 3.6 and Creatinine = 1.40.  eICU Interventions  Will order: 1. Place NGT to LIS. 2. Lactulose 30 gm per tube TID. 3. Replace K+ and PO4--- 4. Repeat Ammonia level in AM.      Intervention Category Major Interventions: Other:  Lysle Dingwall 09/20/2017, 11:30 PM

## 2017-09-20 NOTE — ED Provider Notes (Signed)
[place] [facility] EMERGENCY DEPARTMENT Provider Note   CSN: [id number] Arrival date & time: 09/20/17  1442     History   Chief Complaint Chief Complaint  Patient presents with  . Code Stroke   Level 5 caveat secondary to confusion and code stroke HPI Jesus Martin is a 57 y.o. male.  HPI  57 year old male presents today from home with reports of acute onset of altered mental status.  History is obtained from his wife as patient is having difficulty speaking and is initially sent to the CT scanner on arrival as code stroke.  Wife states that over the past several days to weeks he has intermittently been very thirsty and drinking a lot of milk and any available fluid.  She states that today he became nauseated and vomited multiple times between 1130 and 12.  She states that at 1215 he was abruptly weaker and unable to get out of his chair.  States that his speech was confused at that time.  She did not note any lateralized weakness.  She states that this has not happened before.  She called her son from work and they were able to clean patient up from the vomiting.  They brought him to the hospital by private vehicle.  He has no prior history of diabetes.  On arrival it was noted that his blood sugar was high.  By initial physician evaluation report, he was weak in the left hand and would not gaze to the left.  Past Medical History:  Diagnosis Date  . Bipolar I disorder ([facility]) 1991   Dr. Toy Care  . Borderline hyperlipidemia    history of  . Childhood asthma   . COPD (chronic obstructive pulmonary disease) ([place])   . Elevated glucose   . Fibromyalgia   . GERD (gastroesophageal reflux disease)   . HTN (hypertension)   . Lower back pain   . Paranoia ([place])   . Tobacco abuse   . Tobacco abuse   . Vitamin D deficiency     Patient Active Problem List   Diagnosis Date Noted  . Bipolar 1 disorder ([place]) 04/17/2011  . Hypertension 04/17/2011  . Tobacco abuse 04/17/2011  .  Weight loss, unintentional 04/17/2011    Past Surgical History:  Procedure Laterality Date  . APPENDECTOMY     1975  . BACK SURGERY     1998  . NOSE SURGERY         Home Medications    Prior to Admission medications   Medication Sig Start Date End Date Taking? Authorizing Provider  amLODipine (NORVASC) 5 MG tablet Take 5 mg by mouth daily.      [provider]  aspirin EC 81 MG tablet Take 81 mg by mouth daily.    [provider]  EPINEPHrine (EPIPEN 2-PAK IJ) Inject as directed as directed.    [provider]  lamoTRIgine (LAMICTAL) 150 MG tablet Take 150 mg by mouth 2 (two) times daily. 08/06/17   [provider]  lisinopril-hydrochlorothiazide (PRINZIDE,ZESTORETIC) 20-12.5 MG per tablet Take 1 tablet by mouth daily.      [provider]  lithium carbonate (ESKALITH) 450 MG CR tablet Take 450 mg by mouth 2 (two) times daily. 1-2x per day      [provider]  LORazepam (ATIVAN) 2 MG tablet Take 2 mg by mouth daily as needed for anxiety.    [provider]  MULTIPLE VITAMIN PO Take 1 tablet by mouth daily.  [provider]  Omega-3 1000 MG CAPS Take 1 capsule by mouth at bedtime.    [provider]  omeprazole (PRILOSEC) 40 MG capsule Take 40 mg by mouth daily.    [provider]  ondansetron (ZOFRAN) 4 MG tablet Take 4 mg by mouth every 8 (eight) hours as needed for nausea or vomiting.    [provider]  QUEtiapine (SEROQUEL) 50 MG tablet Take 200 mg by mouth at bedtime.    [provider]  traMADol (ULTRAM) 50 MG tablet Take 50 mg by mouth 2 (two) times daily as needed. 08/01/17   [provider]  TRELEGY ELLIPTA 100-62.5-25 MCG/INH AEPB Inhale 1 puff into the lungs daily. 08/04/17   [provider]  VENTOLIN HFA 108 (90 Base) MCG/ACT inhaler Inhale 2 puffs into the lungs every 6 (six) hours as needed. 08/04/17   [provider]    Family  History Family History  Adopted: Yes    Social History Social History   Tobacco Use  . Smoking status: Current Every Day Smoker    Packs/day: 1.00    Years: 45.00    Pack years: 45.00    Types: Cigarettes  . Smokeless tobacco: Current User    Types: Snuff  . Tobacco comment: 1 pack a day with nicotine patch  Substance Use Topics  . Alcohol use: Yes    Alcohol/week: 8.4 oz    Types: 14 Cans of beer per week    Comment: Recently started drinking 2-4 cans of beer a day  . Drug use: No     Allergies   Patient has no known allergies.   Review of Systems Review of Systems  Unable to perform ROS: Mental status change     Physical Exam Updated Vital Signs Wt 108.6 kg (239 lb 6.7 oz)   BMI 31.59 kg/m   Physical Exam  Constitutional: He appears well-developed and well-nourished. He appears distressed.  Confused and combative  HENT:  Head: Normocephalic and atraumatic.  Right Ear: External ear normal.  Left Ear: External ear normal.  Mucous membranes are dry  Eyes: Pupils are equal, round, and reactive to light.  Pupils small equal and reactive  Neck: Normal range of motion.  Cardiovascular: Tachycardia present.  Pulmonary/Chest: Effort normal and breath sounds normal.  Musculoskeletal: Normal range of motion.  Neurological: He is alert.  Patient confused and keeps repeating that he needs to urinate.  He is moving all four extremities vigorously with good strength  Skin: Skin is warm. Capillary refill takes less than 2 seconds. No rash noted.  Nursing note and vitals reviewed.    ED Treatments / Results  Labs (all labs ordered are listed, but only abnormal results are displayed) Labs Reviewed  CBC - Abnormal; Notable for the following components:      Result Value   WBC 12.9 (*)    All other components within normal limits  DIFFERENTIAL - Abnormal; Notable for the following components:   Neutro Abs 10.4 (*)    All other components within normal limits    CBG MONITORING, ED - Abnormal; Notable for the following components:   Glucose-Capillary >600 (*)    All other components within normal limits  I-STAT CHEM 8, ED - Abnormal; Notable for the following components:   Sodium 130 (*)    Chloride 90 (*)    BUN 24 (*)    Glucose, Bld >700 (*)    All other components within normal limits  PROTIME-INR  APTT  COMPREHENSIVE METABOLIC PANEL  I-STAT TROPONIN, ED    EKG  EKG Interpretation  Date/Time:  Monday September 20 2017 16:17:00 EST Ventricular Rate:  122 PR Interval:    QRS Duration: 125 QT Interval:  405 QTC Calculation: 578 R Axis:   1 Text Interpretation:  Sinus tachycardia Right bundle branch block Confirmed by Pattricia Boss [id number]) on 09/20/2017 4:39:20 PM       Radiology Ct Head Code Stroke Wo Contrast  Result Date: 09/20/2017 CLINICAL DATA:  Code stroke. Confusion and generalized weakness. Elevated glucose. EXAM: CT HEAD WITHOUT CONTRAST TECHNIQUE: Contiguous axial images were obtained from the base of the skull through the vertex without intravenous contrast. COMPARISON:  None. FINDINGS: Significant motion degradation. Study is of reduced diagnostic utility. Brain: No evidence of acute infarction, hemorrhage, hydrocephalus, extra-axial collection or mass lesion/mass effect. Premature for age atrophy. Hypoattenuation of white matter, likely small vessel disease. Chronic LEFT frontal infarct. Vascular: No definite hyperattenuating vessels, although sensitivity is diminished. Skull: No definite fracture. Sinuses/Orbits: No significant sinus opacity or orbital findings. Other: None. ASPECTS [facility] Stroke Program Early CT Score) - Ganglionic level infarction (caudate, lentiform nuclei, internal capsule, insula, M1-M3 cortex): Estimated 7 - Supraganglionic infarction (M4-M6 cortex): Estimated 3 Total score (0-10 with 10 being normal): Estimated 10. IMPRESSION: 1. No definite emergent large vessel occlusion or acute cortical infarction.  Premature for age atrophy with chronic microvascular ischemic change. Old LEFT frontal infarct. 2. ASPECTS is estimated 10. These results were communicated to Lindzen MD at 3:37 pmon 2/11/2019by text page via the [facility] messaging system. Electronically Signed   By: Staci Righter M.D.   On: 09/20/2017 15:40    Procedures Procedures (including critical care time)  Medications Ordered in ED Medications - No data to display   Initial Impression / Assessment and Plan / ED Course  I have reviewed the triage vital signs and the nursing notes.  Pertinent labs & imaging results that were available during my care of the patient were reviewed by me and considered in my medical decision making (see chart for details).     1-agitated delirium/altered mental status-she received Ativan in 1 mg increments to a total of 4 mg of Ativan.DDX-toxic ingestion/od/withdrawal/ intracerebral accident/acute electrolyte abnormality/infection.  Most likely due to #2-new onset dm with hyperglycemia but other initiating/coexisting pathology being considered and evaluated  2- new onset diabetes bs >1200-anion gap 20-IV hydration and insulin being given.  SErum osmolality pending.  SEcond liter iv fluid infusing.  3- sinus tachycardia  Discussed with Dr. Pearline Cables on for critical care he would see and assess patient 6:16 PM Vitals:   09/20/17 1715 09/20/17 1730  BP: (!) 149/82 140/84  Pulse: (!) 127 (!) 133  Resp: 19 19  SpO2: 92% 94%  rectal temp 99 per rn- Dr. Pearline Cables is initiating antibiotic therapy  CRITICAL CARE Performed by: Pattricia Boss Total critical care time: 45 minutes Critical care time was exclusive of separately billable procedures and treating other patients. Critical care was necessary to treat or prevent imminent or life-threatening deterioration. Critical care was time spent personally by me on the following activities: development of treatment plan with patient and/or surrogate as well as nursing,  discussions with consultants, evaluation of patient's response to treatment, examination of patient, obtaining history from patient or surrogate, ordering and performing treatments and interventions, ordering and review of laboratory studies, ordering and review of radiographic studies, pulse oximetry and re-evaluation of patient's condition. 6:21 PM Patient resting but still agitated with stimulation.  Dr.  Pearline Cables at bedside Final Clinical Impressions(s) / ED Diagnoses   Final diagnoses:  Altered mental status, unspecified altered mental status type  Diabetes mellitus, new onset [facility])  Hyperglycemia    ED Discharge Orders    None       Pattricia Boss, MD 09/20/17 [phone]

## 2017-09-21 DIAGNOSIS — E7251 Non-ketotic hyperglycinemia: Secondary | ICD-10-CM

## 2017-09-21 DIAGNOSIS — R410 Disorientation, unspecified: Secondary | ICD-10-CM

## 2017-09-21 LAB — BLOOD CULTURE ID PANEL (REFLEXED)
ACINETOBACTER BAUMANNII: NOT DETECTED
CANDIDA KRUSEI: NOT DETECTED
CANDIDA PARAPSILOSIS: NOT DETECTED
CARBAPENEM RESISTANCE: NOT DETECTED
Candida albicans: NOT DETECTED
Candida glabrata: NOT DETECTED
Candida tropicalis: NOT DETECTED
ENTEROBACTER CLOACAE COMPLEX: NOT DETECTED
ENTEROBACTERIACEAE SPECIES: NOT DETECTED
Enterococcus species: NOT DETECTED
Escherichia coli: NOT DETECTED
Haemophilus influenzae: NOT DETECTED
KLEBSIELLA OXYTOCA: NOT DETECTED
KLEBSIELLA PNEUMONIAE: NOT DETECTED
Listeria monocytogenes: NOT DETECTED
Methicillin resistance: NOT DETECTED
NEISSERIA MENINGITIDIS: NOT DETECTED
PSEUDOMONAS AERUGINOSA: NOT DETECTED
Proteus species: NOT DETECTED
STAPHYLOCOCCUS AUREUS BCID: NOT DETECTED
STAPHYLOCOCCUS SPECIES: DETECTED — AB
STREPTOCOCCUS AGALACTIAE: NOT DETECTED
STREPTOCOCCUS SPECIES: NOT DETECTED
Serratia marcescens: NOT DETECTED
Streptococcus pneumoniae: NOT DETECTED
Streptococcus pyogenes: NOT DETECTED
VANCOMYCIN RESISTANCE: NOT DETECTED

## 2017-09-21 LAB — GLUCOSE, CAPILLARY
GLUCOSE-CAPILLARY: 157 mg/dL — AB (ref 65–99)
GLUCOSE-CAPILLARY: 175 mg/dL — AB (ref 65–99)
GLUCOSE-CAPILLARY: 194 mg/dL — AB (ref 65–99)
Glucose-Capillary: 135 mg/dL — ABNORMAL HIGH (ref 65–99)
Glucose-Capillary: 187 mg/dL — ABNORMAL HIGH (ref 65–99)
Glucose-Capillary: 138 mg/dL — ABNORMAL HIGH (ref 65–99)
Glucose-Capillary: 175 mg/dL — ABNORMAL HIGH (ref 65–99)
Glucose-Capillary: 202 mg/dL — ABNORMAL HIGH (ref 65–99)
Glucose-Capillary: 383 mg/dL — ABNORMAL HIGH (ref 65–99)
Glucose-Capillary: 392 mg/dL — ABNORMAL HIGH (ref 65–99)

## 2017-09-21 LAB — RESPIRATORY PANEL BY PCR
ADENOVIRUS-RVPPCR: NOT DETECTED
Bordetella pertussis: NOT DETECTED
CHLAMYDOPHILA PNEUMONIAE-RVPPCR: NOT DETECTED
CORONAVIRUS 229E-RVPPCR: NOT DETECTED
Coronavirus HKU1: NOT DETECTED
Coronavirus NL63: NOT DETECTED
Coronavirus OC43: NOT DETECTED
INFLUENZA A H1-RVPPCR: NOT DETECTED
INFLUENZA A-RVPPCR: NOT DETECTED
Influenza A H1 2009: NOT DETECTED
Influenza A H3: NOT DETECTED
Influenza B: NOT DETECTED
Metapneumovirus: NOT DETECTED
Mycoplasma pneumoniae: NOT DETECTED
PARAINFLUENZA VIRUS 4-RVPPCR: NOT DETECTED
Parainfluenza Virus 1: NOT DETECTED
Parainfluenza Virus 2: NOT DETECTED
Parainfluenza Virus 3: NOT DETECTED
RESPIRATORY SYNCYTIAL VIRUS-RVPPCR: NOT DETECTED
RHINOVIRUS / ENTEROVIRUS - RVPPCR: NOT DETECTED

## 2017-09-21 LAB — BASIC METABOLIC PANEL
Anion gap: 11 (ref 5–15)
Anion gap: 11 (ref 5–15)
BUN: 28 mg/dL — AB (ref 6–20)
BUN: 25 mg/dL — AB (ref 6–20)
CALCIUM: 11.1 mg/dL — AB (ref 8.9–10.3)
CO2: 28 mmol/L (ref 22–32)
CO2: 29 mmol/L (ref 22–32)
CREATININE: 2 mg/dL — AB (ref 0.61–1.24)
CREATININE: 1.96 mg/dL — AB (ref 0.61–1.24)
Calcium: 10.8 mg/dL — ABNORMAL HIGH (ref 8.9–10.3)
Chloride: 111 mmol/L (ref 101–111)
Chloride: 112 mmol/L — ABNORMAL HIGH (ref 101–111)
GFR calc Af Amer: 41 mL/min — ABNORMAL LOW (ref >60)
GFR calc Af Amer: 42 mL/min — ABNORMAL LOW (ref >60)
GFR, EST NON AFRICAN AMERICAN: 36 mL/min — AB (ref >60)
GFR, EST NON AFRICAN AMERICAN: 36 mL/min — AB (ref >60)
GLUCOSE: 184 mg/dL — AB (ref 65–99)
GLUCOSE: 182 mg/dL — AB (ref 65–99)
POTASSIUM: 4 mmol/L (ref 3.5–5.1)
Potassium: 4.1 mmol/L (ref 3.5–5.1)
SODIUM: 151 mmol/L — AB (ref 135–145)
SODIUM: 151 mmol/L — AB (ref 135–145)

## 2017-09-21 LAB — CBC
HCT: 42.4 % (ref 39.0–52.0)
Hemoglobin: 14.2 g/dL (ref 13.0–17.0)
MCH: 32.4 pg (ref 26.0–34.0)
MCHC: 33.5 g/dL (ref 30.0–36.0)
MCV: 96.8 fL (ref 78.0–100.0)
PLATELETS: 231 10*3/uL (ref 150–400)
RBC: 4.38 MIL/uL (ref 4.22–5.81)
RDW: 12.1 % (ref 11.5–15.5)
WBC: 18.3 10*3/uL — ABNORMAL HIGH (ref 4.0–10.5)

## 2017-09-21 LAB — HIV ANTIBODY (ROUTINE TESTING W REFLEX): HIV SCREEN 4TH GENERATION: NONREACTIVE

## 2017-09-21 LAB — PHOSPHORUS: Phosphorus: 3.3 mg/dL (ref 2.5–4.6)

## 2017-09-21 LAB — AMMONIA: AMMONIA: 69 umol/L — AB (ref 9–35)

## 2017-09-21 LAB — HEMOGLOBIN A1C
HEMOGLOBIN A1C: 11.6 % — AB (ref 4.8–5.6)
Mean Plasma Glucose: 286.22 mg/dL

## 2017-09-21 LAB — LITHIUM LEVEL: LITHIUM LVL: 0.7 mmol/L (ref 0.60–1.20)

## 2017-09-21 LAB — MAGNESIUM: Magnesium: 2.5 mg/dL — ABNORMAL HIGH (ref 1.7–2.4)

## 2017-09-21 LAB — PROCALCITONIN: PROCALCITONIN: 0.64 ng/mL

## 2017-09-21 MED ORDER — MIDAZOLAM HCL 2 MG/2ML IJ SOLN
INTRAMUSCULAR | Status: AC
  Administered 2017-09-21: 1 mg
  Filled 2017-09-21: qty 2

## 2017-09-21 MED ORDER — INSULIN STARTER KIT- SYRINGES (ENGLISH)
1.0000 | Freq: Once | Status: AC
  Administered 2017-09-21: 1
  Filled 2017-09-21: qty 1

## 2017-09-21 MED ORDER — PNEUMOCOCCAL VAC POLYVALENT 25 MCG/0.5ML IJ INJ
0.5000 mL | INJECTION | INTRAMUSCULAR | Status: AC
  Administered 2017-09-23: 0.5 mL via INTRAMUSCULAR
  Filled 2017-09-21 (×2): qty 0.5

## 2017-09-21 MED ORDER — SODIUM CHLORIDE 0.9 % IV BOLUS (SEPSIS)
500.0000 mL | Freq: Once | INTRAVENOUS | Status: AC
  Administered 2017-09-21: 500 mL via INTRAVENOUS

## 2017-09-21 MED ORDER — LIVING WELL WITH DIABETES BOOK
Freq: Once | Status: AC
  Administered 2017-09-21: 15:00:00
  Filled 2017-09-21: qty 1

## 2017-09-21 MED ORDER — LACTULOSE ENEMA
300.0000 mL | Freq: Two times a day (BID) | ORAL | Status: DC
  Administered 2017-09-21 – 2017-09-22 (×2): 300 mL via RECTAL
  Filled 2017-09-21 (×3): qty 300

## 2017-09-21 MED ORDER — LORAZEPAM 2 MG/ML IJ SOLN
1.0000 mg | Freq: Once | INTRAMUSCULAR | Status: AC
  Administered 2017-09-21: 1 mg via INTRAVENOUS
  Filled 2017-09-21: qty 1

## 2017-09-21 MED ORDER — INSULIN GLARGINE 100 UNIT/ML ~~LOC~~ SOLN
30.0000 [IU] | SUBCUTANEOUS | Status: DC
  Administered 2017-09-21 – 2017-09-25 (×5): 30 [IU] via SUBCUTANEOUS
  Filled 2017-09-21 (×5): qty 0.3

## 2017-09-21 MED ORDER — SODIUM CHLORIDE 0.9 % IV SOLN
INTRAVENOUS | Status: DC
  Administered 2017-09-21 – 2017-09-22 (×5): via INTRAVENOUS

## 2017-09-21 MED ORDER — INSULIN ASPART 100 UNIT/ML ~~LOC~~ SOLN
0.0000 [IU] | SUBCUTANEOUS | Status: DC
  Administered 2017-09-21: 8 [IU] via SUBCUTANEOUS
  Administered 2017-09-21: 5 [IU] via SUBCUTANEOUS
  Administered 2017-09-21 (×2): 15 [IU] via SUBCUTANEOUS
  Administered 2017-09-22: 3 [IU] via SUBCUTANEOUS
  Administered 2017-09-22 (×2): 5 [IU] via SUBCUTANEOUS
  Administered 2017-09-22: 3 [IU] via SUBCUTANEOUS
  Administered 2017-09-22: 5 [IU] via SUBCUTANEOUS
  Administered 2017-09-22 – 2017-09-25 (×14): 3 [IU] via SUBCUTANEOUS
  Administered 2017-09-25: 2 [IU] via SUBCUTANEOUS
  Administered 2017-09-25: 3 [IU] via SUBCUTANEOUS
  Administered 2017-09-25: 2 [IU] via SUBCUTANEOUS

## 2017-09-21 NOTE — Progress Notes (Signed)
PHARMACY - PHYSICIAN COMMUNICATION CRITICAL VALUE ALERT - BLOOD CULTURE IDENTIFICATION (BCID)  Jesus Martin is an 57 y.o. male who presented to [facility] on 09/20/2017 with a chief complaint of AMS  Assessment: 54 YOM currently on Rocephin being dosed for rule CNS infection and now with 1/2 blood cultures with coag negative staph - this likely represents a contaminant and no additional treatment is warranted at this time.   Name of physician (or Provider) Contacted: Richardson Landry Minor (via text page)  Current antibiotics: Rocephin 2g IV every 12 hours  Changes to prescribed antibiotics recommended:  Patient is on recommended antibiotics - No changes needed  Results for orders placed or performed during the hospital encounter of 09/20/17  Blood Culture ID Panel (Reflexed) (Collected: 09/20/2017  4:02 PM)  Result Value Ref Range   Enterococcus species NOT DETECTED NOT DETECTED   Vancomycin resistance NOT DETECTED NOT DETECTED   Listeria monocytogenes NOT DETECTED NOT DETECTED   Staphylococcus species DETECTED (A) NOT DETECTED   Staphylococcus aureus NOT DETECTED NOT DETECTED   Methicillin resistance NOT DETECTED NOT DETECTED   Streptococcus species NOT DETECTED NOT DETECTED   Streptococcus agalactiae NOT DETECTED NOT DETECTED   Streptococcus pneumoniae NOT DETECTED NOT DETECTED   Streptococcus pyogenes NOT DETECTED NOT DETECTED   Acinetobacter baumannii NOT DETECTED NOT DETECTED   Enterobacteriaceae species NOT DETECTED NOT DETECTED   Enterobacter cloacae complex NOT DETECTED NOT DETECTED   Escherichia coli NOT DETECTED NOT DETECTED   Klebsiella oxytoca NOT DETECTED NOT DETECTED   Klebsiella pneumoniae NOT DETECTED NOT DETECTED   Proteus species NOT DETECTED NOT DETECTED   Serratia marcescens NOT DETECTED NOT DETECTED   Carbapenem resistance NOT DETECTED NOT DETECTED   Haemophilus influenzae NOT DETECTED NOT DETECTED   Neisseria meningitidis NOT DETECTED NOT DETECTED   Pseudomonas  aeruginosa NOT DETECTED NOT DETECTED   Candida albicans NOT DETECTED NOT DETECTED   Candida glabrata NOT DETECTED NOT DETECTED   Candida krusei NOT DETECTED NOT DETECTED   Candida parapsilosis NOT DETECTED NOT DETECTED   Candida tropicalis NOT DETECTED NOT DETECTED    Lawson Radar 09/21/2017  1:02 PM

## 2017-09-21 NOTE — Progress Notes (Signed)
eLink Physician-Brief Progress Note Patient Name: Jesus Martin DOB: 1960-11-17 MRN: [id number]   Date of Service  09/21/2017  HPI/Events of Note  Nursing unable to place NGT.  eICU Interventions  Will order: 1. D/C Lactulose per tube.  2. Lactulose enema 200 gm PR BID.     Intervention Category Major Interventions: Change in mental status - evaluation and management  [name] 09/21/2017, 1:50 AM

## 2017-09-21 NOTE — Progress Notes (Signed)
SLP Cancellation Note  Patient Details Name: Jesus Martin MRN: [id number] DOB: [birth year]   Cancelled treatment:       Reason Eval/Treat Not Completed: Medical issues which prohibited therapy. RN reports pt is not ready for swallow evaluation at this time. Will f/u as able.   [name] 09/21/2017, 8:10 AM  [name], M.A. CCC-SLP [phone]

## 2017-09-21 NOTE — Progress Notes (Signed)
Takled to Dr. Lamonte Sakai about patient and he agrees this is likely metabolic.  Will S/O and if needed they will call Neurology back  Etta Quill PA-C [facility] [phone]  M-F  (8:30 am- 4 PM)  09/21/2017, 3:29 PM

## 2017-09-21 NOTE — Progress Notes (Addendum)
0700 Bedside shift report, pt yelling, screaming at times. Pt not following commands, in 4 pt soft limb restraints. Pt impulsive, lines, tubes, checked and verified. Fall precautions in place, [facility].   0730 Pt assessed, see flow sheet, pt not following commands. Low BP, Richardson Landry, NP informed, doubts BP is that low due to agitation and yelling, not concerned at this time. Cuff moved to right UE.   0830 BP still low, pt still yelling out and very agitated. Orders received.   0900 Pt medicated per [facility], RN received orders to turn precedex back on at min of 0.4. Precedex restarted. WCTM closey.   1015 Spouse at bedside, able to calm down pt. New orders received for bolus and increase of IVF.   1200 Pt resting comfortably at moment, pt though, wakes up spontaneously and starts yelling, pulling at restraints.   1230 Diabetic book and starter kit given to wife Larene Beach. Larene Beach educated about sliding scale, how to give shots, and how to take blood sugar. All questions answered. WCTM.   80 Pt sleeping comfortably, informed MD, Dr. Lamonte Sakai about low urine outpt, awaiting orders.   1600 Dgt and wife at bedside. Updated with POC, and high blood sugars. RN reviewed sliding scale with pt's wife and pt's dgt at bedside, educated how to read sliding scale and give insulin injection. Pt's spouse stated she would try next time. WCTM.   1730 Pt sleeping soundly, wife at bedside, BP still soft but MD aware. WCTM.   1910 Bedside report given to [facility], Therapist, sports. Pt resting, easy to arouse, gets agitated easily with arousal. Pt pulled up and repositioned. Wife at bedside.

## 2017-09-21 NOTE — Progress Notes (Signed)
PULMONARY / CRITICAL CARE MEDICINE   Name: Jesus Martin MRN: [id number] DOB: 1960/12/14    ADMISSION DATE:  09/20/2017   CHIEF COMPLAINT: Altered mental status  HISTORY OF PRESENT ILLNESS:        This is a previously undiagnosed diabetic with a history of COPD and bipolar disorder who has not felt well for the past week.  He has been suffering from a "upset stomach" for the past week has had very little p.o. intake other than immense amounts of liquids.  He has been having liquid stool up until today.  He is also been having rigors at home.  He has not had any associated cough or more than his usual shortness of breath.  On arrival in the emergency room mental status was altered and he was not able to give any history.  During my examination he is very very agitated and history is obtained entirely from his wife.    SUBJECTIVE:  Intermittently combative but when wife is at bedside he will cooperate and answer some questions.  VITAL SIGNS: BP (!) 97/32   Pulse 79   Temp 98.4 F (36.9 C)   Resp (!) 30   Ht 6' 1"  (1.854 m)   Wt 113.2 kg (249 lb 9 oz)   SpO2 96%   BMI 32.93 kg/m   HEMODYNAMICS:    VENTILATOR SETTINGS:    INTAKE / OUTPUT: I/O last 3 completed shifts: In: 3390.1 [I.V.:1330.1; Other:150; IV Piggyback:1910] Out: -   PHYSICAL EXAMINATION: General: Well-nourished well-developed male who is in acute delirious state he is shouting and fighting. HEENT: Neck is supple moves well, negative lymphadenopathy or JVD PSY: Extremely agitated Neuro: Moves all extremities x4 does not follow commands CV: s1s2 rrr, no m/r/g PULM: even/non-labored, lungs bilaterally clear TD:DUKG, non-tender, bsx4 active  Extremities: warm/dry, negative edema  Skin: no rashes or lesions   LABS:  BMET Recent Labs  Lab 09/20/17 2112 09/21/17 0019 09/21/17 0312  NA 151* 151* 151*  K 3.6 4.0 4.1  CL 105 112* 111  CO2 30 28 29   BUN 22* 25* 28*  CREATININE 1.40* 1.96* 2.00*   GLUCOSE 345* 182* 184*    Electrolytes Recent Labs  Lab 09/20/17 2112 09/21/17 0019 09/21/17 0312  CALCIUM 12.4* 11.1* 10.8*  MG 2.7*  --  2.5*  PHOS 1.1*  --  3.3    CBC Recent Labs  Lab 09/20/17 1449 09/20/17 1456 09/20/17 2112 09/21/17 0312  WBC 12.9*  --  21.5* 18.3*  HGB 15.7 16.3 16.0 14.2  HCT 47.0 48.0 45.1 42.4  PLT 296  --  348 231    Coag's Recent Labs  Lab 09/20/17 1449  APTT 27  INR 1.03    Sepsis Markers Recent Labs  Lab 09/20/17 1800 09/21/17 0312  PROCALCITON 0.40 0.64    ABG Recent Labs  Lab 09/20/17 1616  PHART 7.422  PCO2ART 46.5  PO2ART 59.0*    Liver Enzymes Recent Labs  Lab 09/20/17 1449  AST 211*  ALT 294*  ALKPHOS 140*  BILITOT 1.4*  ALBUMIN 4.8    Cardiac Enzymes No results for input(s): TROPONINI, PROBNP in the last 168 hours.  Glucose Recent Labs  Lab 09/21/17 0014 09/21/17 0121 09/21/17 0225 09/21/17 0338 09/21/17 0445 09/21/17 0553  GLUCAP 187* 135* 138* 175* 194* 175*    Imaging Dg Chest Port 1 View  Result Date: 09/20/2017 CLINICAL DATA:  57 year old male with agitation, confusion, vomiting, hyperglycemia. EXAM: PORTABLE CHEST 1 VIEW COMPARISON:  Chest  radiographs 07/29/2017. FINDINGS: Portable AP semi upright view at 1809 hr. Lordotic positioning. Lung volumes appear stable. Mediastinal contours remain within normal limits. Visualized tracheal air column is within normal limits. Allowing for portable technique the lungs are clear. No acute osseous abnormality identified. IMPRESSION: No acute cardiopulmonary abnormality. Electronically Signed   By: Genevie Ann M.D.   On: 09/20/2017 18:53   Ct Head Code Stroke Wo Contrast  Result Date: 09/20/2017 CLINICAL DATA:  Code stroke. Confusion and generalized weakness. Elevated glucose. EXAM: CT HEAD WITHOUT CONTRAST TECHNIQUE: Contiguous axial images were obtained from the base of the skull through the vertex without intravenous contrast. COMPARISON:  None.  FINDINGS: Significant motion degradation. Study is of reduced diagnostic utility. Brain: No evidence of acute infarction, hemorrhage, hydrocephalus, extra-axial collection or mass lesion/mass effect. Premature for age atrophy. Hypoattenuation of white matter, likely small vessel disease. Chronic LEFT frontal infarct. Vascular: No definite hyperattenuating vessels, although sensitivity is diminished. Skull: No definite fracture. Sinuses/Orbits: No significant sinus opacity or orbital findings. Other: None. ASPECTS [facility] Stroke Program Early CT Score) - Ganglionic level infarction (caudate, lentiform nuclei, internal capsule, insula, M1-M3 cortex): Estimated 7 - Supraganglionic infarction (M4-M6 cortex): Estimated 3 Total score (0-10 with 10 being normal): Estimated 10. IMPRESSION: 1. No definite emergent large vessel occlusion or acute cortical infarction. Premature for age atrophy with chronic microvascular ischemic change. Old LEFT frontal infarct. 2. ASPECTS is estimated 10. These results were communicated to Lindzen MD at 3:37 pmon 2/11/2019by text page via the [facility] messaging system. Electronically Signed   By: Staci Righter M.D.   On: 09/20/2017 15:40   US Abdomen Limited Ruq  Result Date: 09/20/2017 CLINICAL DATA:  Elevated liver function tests. EXAM: ULTRASOUND ABDOMEN LIMITED RIGHT UPPER QUADRANT COMPARISON:  None. FINDINGS: Gallbladder: No gallstones or wall thickening visualized. No sonographic Murphy sign noted by sonographer. Common bile duct: Diameter: 6 mm, within normal limits. Liver: Diffusely increased echogenicity of the hepatic parenchyma, consistent with hepatic steatosis. No focal mass lesion identified. IMPRESSION: No evidence of cholelithiasis or biliary ductal dilatation. Diffuse hepatic steatosis. Electronically Signed   By: Earle Gell M.D.   On: 09/20/2017 20:14     STUDIES:   ANTIBIOTICS: Vancomycin, Cipro, and CNS dose Rocephin initiated on 2/11   DISCUSSION:      This  is a previously undiagnosed diabetic who presents with altered mental status and profoundly elevated sugar.  In addition he is febrile with a modest leukocytosis and abnormal LFTs.  Although this largely may represent a first manifestation of diabetes, I am concerned in any acute illness precipitated this hypoglycemic event.  ASSESSMENT / PLAN:  PULMONARY A:  Chest x-ray no acute abnormality  Plan  He was placed on vancomycin ciprofloxacin and ceftriaxone for suspected infectious process.  We will DC vancomycin and ciprofloxacin at this time. Note he may require intubation due to the acuity of his delirium may require intubation and LP in the near future.   CV: A:  Hypotension Most likely secondary to hypovolemia from hyperosmolar state from uncontrolled diabetes. P: Fluid resuscitation Monitor hemodynamics in ICU.  RENAL Lab Results  Component Value Date   CREATININE 2.00 (H) 09/21/2017   CREATININE 1.96 (H) 09/21/2017   CREATININE 1.40 (H) 09/20/2017   CREATININE 0.88 06/24/2011   Recent Labs  Lab 09/20/17 2112 09/21/17 0019 09/21/17 0312  K 3.6 4.0 4.1    A: Elevated creatinine  Plan: Trend creatinine As needed  GASTROINTESTINAL A:  Elevated LFT on bipolar medication. Plan  Right upper quadrant ultrasound was negative  INFECTIOUS A:  Elevated white count of 21.5 Pro-calcitonin 0.64 Fever 102.7  P: Empiric antibiotic started with vancomycin ceftriaxone and ciprofloxacin on 09/20/2017. 09/21/2017 we will DC vancomycin and ciprofloxacin Pan culture Question if he needs an LP if so he will require intubation and sedation as he is too combative for LP to be performed. Follow culture data  ENDOCRINE CBG (last 3)  Recent Labs    09/21/17 0338 09/21/17 0445 09/21/17 0553  GLUCAP 175* 194* 175*    A:  Nonketotic hyperglycemia  P: Sliding scale insulin  NEUROLOGIC A:  Known history of bipolar disease Suspected EtOH abuse Delirium of unknown  origin Questionable drug or alcohol withdrawal Note he takes Xanax 2 mg twice daily and Seroquel and lithium on a daily basis  P: Precedex drip as tolerated PRN fentanyl He may require intubation for comfort control  App cct 45 min   Richardson Landry Minor ACNP Maryanna Shape PCCM Pager [phone] till 1 pm If no answer page [phone] 09/21/2017, 9:24 AM

## 2017-09-21 NOTE — Progress Notes (Signed)
[facility] Progress Note Patient Name: CHAOS CARLILE DOB: 26-May-1961 MRN: [id number]   Date of Service  09/21/2017  HPI/Events of Note  Blood glucose = 194. Will transition off of insulin IV infusion.   eICU Interventions  Will order: 1. Lantus 30 units [place] now and Q day.  2. QQ 4 hour moderate Novolog SSI. 3. D/C insulin IV infusion 1 hours post Lantus dose.      Intervention Category Major Interventions: Hyperglycemia - active titration of insulin therapy  Sommer,Steven Cornelia Copa 09/21/2017, 5:45 AM

## 2017-09-21 NOTE — Progress Notes (Addendum)
[facility] Progress Note Patient Name: Jesus Martin DOB: 08/31/1960 MRN: [id number]   Date of Service  09/21/2017  HPI/Events of Note  Hypercalemia - Ca++ = 11.1. Likely d/t Lithium carbonate therapy. Lithium carbonate is now being held. Patient is being hydrated with 0.9 NaCl.   eICU Interventions  Will continue with holding Lithium and saline hydration. Follow Ca++ Q 4 hours.         Linnie Delgrande Cornelia Copa 09/21/2017, 4:23 AM

## 2017-09-21 NOTE — Progress Notes (Signed)
Inpatient Diabetes Program Recommendations  AACE/ADA: New Consensus Statement on Inpatient Glycemic Control (2015)  Target Ranges:  Prepandial:   less than 140 mg/dL      Peak postprandial:   less than 180 mg/dL (1-2 hours)      Critically ill patients:  140 - 180 mg/dL   Lab Results  Component Value Date   GLUCAP 175 (H) 09/21/2017   HGBA1C 11.6 (H) 09/21/2017    Review of Glycemic Control  Diabetes history: New onset Outpatient Diabetes medications: None Current orders for Inpatient glycemic control: Lantus 30 units qd + Novolog moderate correction q 4 hrs.  Inpatient Diabetes Program Recommendations:   Noted patient has no prior dx of DM. Will plan to speak with patient @ bedside. Ordered Living Well With Diabetes Book,nutrition consult,starter kit with syringes, and patient education videos.  Thank you, Nani Gasser. Jesus Latouche, RN, MSN, CDE  Diabetes Coordinator Inpatient Glycemic Control Team Team Pager [phone] (8am-5pm) 09/21/2017 10:37 AM

## 2017-09-21 NOTE — Progress Notes (Signed)
Unable to place NGT, pt delirious and combative, non compliant and unable to redirect. Precedex gtt increased and ELink notified. Awaiting orders.

## 2017-09-22 ENCOUNTER — Inpatient Hospital Stay (HOSPITAL_COMMUNITY): Payer: Medicare HMO

## 2017-09-22 DIAGNOSIS — J9601 Acute respiratory failure with hypoxia: Secondary | ICD-10-CM

## 2017-09-22 DIAGNOSIS — J9600 Acute respiratory failure, unspecified whether with hypoxia or hypercapnia: Secondary | ICD-10-CM

## 2017-09-22 DIAGNOSIS — R4182 Altered mental status, unspecified: Secondary | ICD-10-CM

## 2017-09-22 LAB — GLUCOSE, CAPILLARY
GLUCOSE-CAPILLARY: 144 mg/dL — AB (ref 65–99)
GLUCOSE-CAPILLARY: 161 mg/dL — AB (ref 65–99)
GLUCOSE-CAPILLARY: 211 mg/dL — AB (ref 65–99)
Glucose-Capillary: 184 mg/dL — ABNORMAL HIGH (ref 65–99)
Glucose-Capillary: 193 mg/dL — ABNORMAL HIGH (ref 65–99)
Glucose-Capillary: 196 mg/dL — ABNORMAL HIGH (ref 65–99)
Glucose-Capillary: 212 mg/dL — ABNORMAL HIGH (ref 65–99)
Glucose-Capillary: 242 mg/dL — ABNORMAL HIGH (ref 65–99)
Glucose-Capillary: 253 mg/dL — ABNORMAL HIGH (ref 65–99)

## 2017-09-22 LAB — BASIC METABOLIC PANEL
Anion gap: 12 (ref 5–15)
BUN: 31 mg/dL — AB (ref 6–20)
CALCIUM: 9.7 mg/dL (ref 8.9–10.3)
CO2: 25 mmol/L (ref 22–32)
CREATININE: 1.7 mg/dL — AB (ref 0.61–1.24)
Chloride: 117 mmol/L — ABNORMAL HIGH (ref 101–111)
GFR calc non Af Amer: 43 mL/min — ABNORMAL LOW (ref >60)
GFR, EST AFRICAN AMERICAN: 50 mL/min — AB (ref >60)
Glucose, Bld: 188 mg/dL — ABNORMAL HIGH (ref 65–99)
Potassium: 3.6 mmol/L (ref 3.5–5.1)
SODIUM: 154 mmol/L — AB (ref 135–145)

## 2017-09-22 LAB — MAGNESIUM: Magnesium: 2.5 mg/dL — ABNORMAL HIGH (ref 1.7–2.4)

## 2017-09-22 LAB — PHOSPHORUS: PHOSPHORUS: 2.8 mg/dL (ref 2.5–4.6)

## 2017-09-22 LAB — PROCALCITONIN: Procalcitonin: 0.14 ng/mL

## 2017-09-22 MED ORDER — SODIUM CHLORIDE 0.9 % IV SOLN
2.0000 g | INTRAVENOUS | Status: AC
  Administered 2017-09-23 – 2017-09-24 (×2): 2 g via INTRAVENOUS
  Filled 2017-09-22 (×2): qty 20

## 2017-09-22 MED ORDER — LITHIUM CITRATE 300 MG/5 ML PO SYRP
300.0000 mg | Freq: Three times a day (TID) | ORAL | Status: DC
  Administered 2017-09-22 – 2017-09-23 (×2): 300 mg via ORAL
  Filled 2017-09-22 (×4): qty 5

## 2017-09-22 NOTE — Evaluation (Signed)
Clinical/Bedside Swallow Evaluation Patient Details  Name: Jesus Martin MRN: [id number] Date of Birth: 1960/09/29  Today's Date: 09/22/2017 Time: SLP Start Time (ACUTE ONLY): 1013 SLP Stop Time (ACUTE ONLY): 1029 SLP Time Calculation (min) (ACUTE ONLY): 16 min  Past Medical History:  Past Medical History:  Diagnosis Date  . Bipolar I disorder (HCC) 1991   Dr. Toy Care  . Borderline hyperlipidemia    history of  . Childhood asthma   . COPD (chronic obstructive pulmonary disease) ([place])   . Elevated glucose   . Fibromyalgia   . GERD (gastroesophageal reflux disease)   . HTN (hypertension)   . Lower back pain   . Paranoia ([place])   . Tobacco abuse   . Tobacco abuse   . Vitamin D deficiency    Past Surgical History:  Past Surgical History:  Procedure Laterality Date  . APPENDECTOMY     1975  . BACK SURGERY     1998  . NOSE SURGERY     HPI:  Pt is a 57 year old man with history of bipolar disease, alcohol abuse, diabetes (poorly controlled), GERD, and COPD, who presented with AMS in the setting of rigors, severe hyperglycemia, metabolic disarray, and abdominal discomfort. There is also concern for alcohol withdrawal.   Assessment / Plan / Recommendation Clinical Impression  Pt has a cognitively-based oral dysphagia, characterized by reduced awareness, prolonged transit, and incomplete oral clearance with Max faded to Mod cues provided by SLP. Suspect a delay in swallow initiation as well. Shortly after pt finished PO trials he started to have delayed, strong coughing and regurgitation of a moderate amount of thin liquid. Discussed with RN - would recommend meds crushed in puree and sips of water wtih good oral care as long as pt is tolerating without further regurgitation. Will continue to follow for progress with good prognosis pending improving mentation and ability to keep food/drink down. SLP Visit Diagnosis: Dysphagia, oropharyngeal phase (R13.12)    Aspiration Risk  Mild  aspiration risk;Moderate aspiration risk    Diet Recommendation NPO except meds;Free water protocol after oral care   Liquid Administration via: Cup;Straw Medication Administration: Crushed with puree Supervision: Full supervision/cueing for compensatory strategies Compensations: Slow rate;Small sips/bites Postural Changes: Seated upright at 90 degrees;Remain upright for at least 30 minutes after po intake    Other  Recommendations Oral Care Recommendations: Oral care QID;Oral care prior to ice chip/H20   Follow up Recommendations (tba)      Frequency and Duration min 2x/week  2 weeks       Prognosis Prognosis for Safe Diet Advancement: Good Barriers to Reach Goals: Cognitive deficits      Swallow Study   General HPI: Pt is a 57 year old man with history of bipolar disease, alcohol abuse, diabetes (poorly controlled), GERD, and COPD, who presented with AMS in the setting of rigors, severe hyperglycemia, metabolic disarray, and abdominal discomfort. There is also concern for alcohol withdrawal. Type of Study: Bedside Swallow Evaluation Previous Swallow Assessment: none in chart Diet Prior to this Study: NPO Temperature Spikes Noted: Yes(100.4) Respiratory Status: Nasal cannula History of Recent Intubation: No Behavior/Cognition: Alert;Cooperative;Confused;Requires cueing Oral Cavity Assessment: Dry;Dried secretions Oral Care Completed by SLP: Recent completion by staff Oral Cavity - Dentition: Adequate natural dentition Self-Feeding Abilities: Total assist Patient Positioning: Upright in bed Baseline Vocal Quality: Normal Volitional Cough: Cognitively unable to elicit Volitional Swallow: Unable to elicit    Oral/Motor/Sensory Function Overall Oral Motor/Sensory Function: (cognitively unable to follow commands to assess formally)   Ice  Chips Ice chips: Impaired Presentation: Spoon Oral Phase Impairments: Poor awareness of bolus Pharyngeal Phase Impairments: Suspected  delayed Swallow   Thin Liquid Thin Liquid: Impaired Presentation: Spoon;Straw Oral Phase Impairments: Poor awareness of bolus Pharyngeal  Phase Impairments: Suspected delayed Swallow;Cough - Delayed    Nectar Thick Nectar Thick Liquid: Not tested   Honey Thick Honey Thick Liquid: Not tested   Puree Puree: Impaired Presentation: Spoon Oral Phase Impairments: Poor awareness of bolus Oral Phase Functional Implications: Prolonged oral transit Pharyngeal Phase Impairments: Cough - Delayed   Solid   GO   Solid: Impaired Oral Phase Impairments: Poor awareness of bolus;Impaired mastication Oral Phase Functional Implications: Oral residue Pharyngeal Phase Impairments: Cough - Delayed        Jesus Martin 09/22/2017,2:37 PM   Jesus Martin, M.A. CCC-SLP [phone]

## 2017-09-22 NOTE — Progress Notes (Signed)
Nutrition Consult  RD consulted for nutrition education regarding diabetes. Patient sleeping soundly. Spoke with his wife regarding diet education.   Lab Results  Component Value Date   HGBA1C 11.6 (H) 09/21/2017    RD provided wife with "Carbohydrate Counting for People with Diabetes" handout from the Academy of Nutrition and Dietetics. Discussed different food groups and their effects on blood sugar, emphasizing carbohydrate-containing foods. Provided list of carbohydrates and recommended serving sizes of common foods.  Discussed importance of controlled and consistent carbohydrate intake throughout the day. Provided examples of ways to balance meals/snacks and encouraged intake of high-fiber, whole grain complex carbohydrates.   Recommend outpatient diet education for.   Body mass index is 32.87 kg/m. Pt meets criteria for obesity based on current BMI.  Current diet order is NPO. Labs and medications reviewed. No further nutrition interventions warranted at this time. RD contact information provided. If additional nutrition issues arise, please re-consult RD.  Molli Barrows, RD, LDN, [place] Pager [phone] After Hours Pager [phone]

## 2017-09-22 NOTE — Progress Notes (Signed)
Attempted to speak with patient but unable to discuss due to patient sleeping. Spoke with mother in law @ bedside and she states she and patient's wife has been reading the Living Well With Diabetes and watches videos.  Thank you, Nani Gasser. Marni Franzoni, RN, MSN, CDE  Diabetes Coordinator Inpatient Glycemic Control Team Team Pager [phone] (8am-5pm) 09/22/2017 4:18 PM

## 2017-09-22 NOTE — Progress Notes (Signed)
PULMONARY / CRITICAL CARE MEDICINE   Name: Jesus Martin MRN: [id number] DOB: [birth year]    ADMISSION DATE:  09/20/2017 CONSULTATION DATE:  09/20/17  REFERRING MD:  Jeanell Sparrow  CHIEF COMPLAINT:  AMS  HISTORY OF PRESENT ILLNESS:        This is a previously undiagnosed diabetic with a history of COPD and bipolar disorder who had not felt well for a week prior to admission.  He has been suffering from a "upset stomach" for the past week has had very little p.o. intake other than immense amounts of liquids.  He has been having liquid stool up until today.  He is also been having rigors at home.  He has not had any associated cough or more than his usual shortness of breath.  On arrival in the emergency room mental status was altered and he was not able to give any history.  During my examination he is very agitated and history is obtained entirely from his wife.  PAST MEDICAL HISTORY :  He  has a past medical history of Bipolar I disorder ([place]) (1991), Borderline hyperlipidemia, Childhood asthma, COPD (chronic obstructive pulmonary disease) ([place]), Elevated glucose, Fibromyalgia, GERD (gastroesophageal reflux disease), HTN (hypertension), Lower back pain, Paranoia ([place]), Tobacco abuse, Tobacco abuse, and Vitamin D deficiency.  PAST SURGICAL HISTORY: He  has a past surgical history that includes Appendectomy; Back surgery; and Nose surgery.  Allergies  Allergen Reactions  . Yellow Jacket Venom [Bee Venom] Anaphylaxis and Other (See Comments)    And the patient passes out    No current facility-administered medications on file prior to encounter.    Current Outpatient Medications on File Prior to Encounter  Medication Sig  . acetaminophen (TYLENOL) 325 MG tablet Take 325-650 mg by mouth every 6 (six) hours as needed (for pain or headaches).  . ALPRAZolam (XANAX) 1 MG tablet Take 2 mg by mouth every morning.  Marland Kitchen amLODipine (NORVASC) 5 MG tablet Take 5 mg by mouth daily.    . Cholecalciferol  (VITAMIN D-3) 1000 units CAPS Take 1,000 Units by mouth daily.  . diphenhydrAMINE (BENADRYL) 25 mg capsule Take 25 mg by mouth every 8 (eight) hours as needed for allergies.  Marland Kitchen EPINEPHrine (EPIPEN 2-PAK) 0.3 mg/0.3 mL IJ SOAJ injection Inject 0.3 mg into the muscle once as needed (for anaphylactic reaction to bee stings).  . lamoTRIgine (LAMICTAL) 150 MG tablet Take 150 mg by mouth 2 (two) times daily.  Marland Kitchen lisinopril-hydrochlorothiazide (PRINZIDE,ZESTORETIC) 20-12.5 MG per tablet Take 1 tablet by mouth daily.    Marland Kitchen lithium carbonate (ESKALITH) 450 MG CR tablet Take 450-675 mg by mouth See admin instructions. 675 mg by mouth in the morning and 450 mg at bedtime  . multivitamin (ONE-A-DAY MEN'S) TABS tablet Take 1 tablet by mouth daily.  Marland Kitchen omeprazole (PRILOSEC) 40 MG capsule Take 40 mg by mouth daily.  . ondansetron (ZOFRAN) 4 MG tablet Take 4 mg by mouth every 8 (eight) hours as needed for nausea or vomiting.  Marland Kitchen QUEtiapine (SEROQUEL) 400 MG tablet Take 200 mg by mouth at bedtime.  . traMADol (ULTRAM) 50 MG tablet Take 50 mg by mouth 2 (two) times daily as needed (for pain).   . TRELEGY ELLIPTA 100-62.5-25 MCG/INH AEPB Inhale 1 puff into the lungs daily.  . VENTOLIN HFA 108 (90 Base) MCG/ACT inhaler Inhale 2 puffs into the lungs every 6 (six) hours as needed for wheezing or shortness of breath.   . QUEtiapine (SEROQUEL) 50 MG tablet Take 200 mg by mouth  at bedtime.    FAMILY HISTORY:  His is adopted.    SOCIAL HISTORY: He  reports that he has been smoking cigarettes.  He has a 45.00 pack-year smoking history. His smokeless tobacco use includes snuff. He reports that he drinks about 8.4 oz of alcohol per week. He reports that he does not use drugs.  REVIEW OF SYSTEMS:   Unable to obtain 2/2 mental status  SUBJECTIVE:  Sleeping, wife and mother at bedside report he is still altered and agitated   VITAL SIGNS: BP 120/83 (BP Location: Left Arm)   Pulse 66   Temp 99.3 F (37.4 C) (Bladder)    Resp 19   Ht 6' 1"  (1.854 m)   Wt 249 lb 1.9 oz (113 kg)   SpO2 95%   BMI 32.87 kg/m   HEMODYNAMICS:    VENTILATOR SETTINGS:    INTAKE / OUTPUT: I/O last 3 completed shifts: In: 5170.6 [I.V.:4010.6; Other:150; IV Piggyback:1010] Out: 1275 [Urine:1275]  PHYSICAL EXAMINATION:  General: Well-nourished well-developed male, [place] in place, sleeping on my exam HEENT: dry lips Neuro: Moves all extremities x4 does not follow commands CV: s1s2 rrr, no m/r/g PULM: even/non-labored, lungs bilaterally clear GI: soft, distended, hypoactive bowel sounds Extremities: warm/dry, negative edema  Skin: no rashes or lesions  LABS:  BMET Recent Labs  Lab 09/21/17 0019 09/21/17 0312 09/22/17 0321  NA 151* 151* 154*  K 4.0 4.1 3.6  CL 112* 111 117*  CO2 28 29 25   BUN 25* 28* 31*  CREATININE 1.96* 2.00* 1.70*  GLUCOSE 182* 184* 188*    Electrolytes Recent Labs  Lab 09/20/17 2112 09/21/17 0019 09/21/17 0312 09/22/17 0321  CALCIUM 12.4* 11.1* 10.8* 9.7  MG 2.7*  --  2.5* 2.5*  PHOS 1.1*  --  3.3 2.8    CBC Recent Labs  Lab 09/20/17 1449 09/20/17 1456 09/20/17 2112 09/21/17 0312  WBC 12.9*  --  21.5* 18.3*  HGB 15.7 16.3 16.0 14.2  HCT 47.0 48.0 45.1 42.4  PLT 296  --  348 231    Coag's Recent Labs  Lab 09/20/17 1449  APTT 27  INR 1.03    Sepsis Markers Recent Labs  Lab 09/20/17 1800 09/21/17 0312 09/22/17 0321  PROCALCITON 0.40 0.64 0.14    ABG Recent Labs  Lab 09/20/17 1616  PHART 7.422  PCO2ART 46.5  PO2ART 59.0*    Liver Enzymes Recent Labs  Lab 09/20/17 1449  AST 211*  ALT 294*  ALKPHOS 140*  BILITOT 1.4*  ALBUMIN 4.8    Cardiac Enzymes No results for input(s): TROPONINI, PROBNP in the last 168 hours.  Glucose Recent Labs  Lab 09/21/17 0838 09/21/17 1125 09/21/17 1556 09/22/17 0000 09/22/17 0352 09/22/17 0747  GLUCAP 202* 383* 392* 157* 161* 211*    Imaging Dg Chest Port 1 View  Result Date: 09/22/2017 CLINICAL DATA:   Respiratory failure. EXAM: PORTABLE CHEST 1 VIEW COMPARISON:  09/20/2017 FINDINGS: The cardiomediastinal silhouette is unchanged and within normal limits. Lung volumes remain low. There is minimal right midlung opacity. The left lung is clear. No sizable pleural effusion or pneumothorax is identified. IMPRESSION: Low lung volumes with minimal right midlung atelectasis. Electronically Signed   By: Logan Bores M.D.   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 Diffuse hepatosteatosis  2/13 CXR- right midlung  atelectasis  CULTURES: RVP negative Blood cx 2/11- 1 bottle + staph- likely contaminate  ANTIBIOTICS: Vancomycin 2/11 > 2/12 Cipro 2/11 > 2/12 Rocephin 2/11 >>  SIGNIFICANT EVENTS: none  LINES/TUBES: PIV   DISCUSSION:      This is a previously undiagnosed diabetic 57 year old male who presents with altered mental status and profoundly elevated sugar.  In addition he is febrile with a modest leukocytosis and abnormal LFTs.  ASSESSMENT / PLAN:  PULMONARY A: none P:   Monitor respiratory status  CARDIOVASCULAR A:  Hypotension- resolve P:  Fluids NS 150 cc/hr  RENAL A:   Elevated Cr Hypernatremia P:   Monitor BMP Monitor UOP  GASTROINTESTINAL A:   Elevated LFTs ?constipation Elevated ammonia level P:   Trend CMP Lactulose enemas  HEMATOLOGIC A:   Leukocytosis P:  Daily CBC  INFECTIOUS A:   Leukocytosis Febrile Elevated Pro-cal P:   Consider LP Follow cultures Continue CTX  ENDOCRINE A:   T2DM- A1C 11.6   P:   SSI moderate lantus 30 units qd  NEUROLOGIC A:   Delirium Bipolar disorder On lithium, seroquel, Xanax and lamictal at home P:   Lithium level WNL Restraints precedex and fentanyl seroquel  FAMILY  - Updates: wife  updated at bedside 2/13 - Inter-disciplinary family meet or Palliative Care meeting due by: 2/18  Lucila Maine, DO PGY-2, [facility] 09/22/2017 8:54 AM

## 2017-09-23 ENCOUNTER — Other Ambulatory Visit: Payer: Self-pay

## 2017-09-23 DIAGNOSIS — E119 Type 2 diabetes mellitus without complications: Secondary | ICD-10-CM

## 2017-09-23 LAB — BASIC METABOLIC PANEL
ANION GAP: 10 (ref 5–15)
BUN: 13 mg/dL (ref 6–20)
CO2: 26 mmol/L (ref 22–32)
Calcium: 9.4 mg/dL (ref 8.9–10.3)
Chloride: 116 mmol/L — ABNORMAL HIGH (ref 101–111)
Creatinine, Ser: 1.12 mg/dL (ref 0.61–1.24)
GFR calc Af Amer: >60 mL/min (ref >60)
GLUCOSE: 181 mg/dL — AB (ref 65–99)
POTASSIUM: 3.4 mmol/L — AB (ref 3.5–5.1)
Sodium: 152 mmol/L — ABNORMAL HIGH (ref 135–145)

## 2017-09-23 LAB — CULTURE, BLOOD (ROUTINE X 2): SPECIAL REQUESTS: ADEQUATE

## 2017-09-23 LAB — CBC
HEMATOCRIT: 40.1 % (ref 39.0–52.0)
HEMOGLOBIN: 12.7 g/dL — AB (ref 13.0–17.0)
MCH: 31.8 pg (ref 26.0–34.0)
MCHC: 31.7 g/dL (ref 30.0–36.0)
MCV: 100.3 fL — AB (ref 78.0–100.0)
Platelets: 166 10*3/uL (ref 150–400)
RBC: 4 MIL/uL — ABNORMAL LOW (ref 4.22–5.81)
RDW: 12.9 % (ref 11.5–15.5)
WBC: 11 10*3/uL — ABNORMAL HIGH (ref 4.0–10.5)

## 2017-09-23 LAB — GLUCOSE, CAPILLARY
GLUCOSE-CAPILLARY: 206 mg/dL — AB (ref 65–99)
Glucose-Capillary: 172 mg/dL — ABNORMAL HIGH (ref 65–99)
Glucose-Capillary: 154 mg/dL — ABNORMAL HIGH (ref 65–99)
Glucose-Capillary: 156 mg/dL — ABNORMAL HIGH (ref 65–99)
Glucose-Capillary: 181 mg/dL — ABNORMAL HIGH (ref 65–99)
Glucose-Capillary: 171 mg/dL — ABNORMAL HIGH (ref 65–99)

## 2017-09-23 MED ORDER — LAMOTRIGINE 150 MG PO TABS
150.0000 mg | ORAL_TABLET | Freq: Two times a day (BID) | ORAL | Status: DC
  Administered 2017-09-23 – 2017-09-28 (×11): 150 mg via ORAL
  Filled 2017-09-23 (×12): qty 1

## 2017-09-23 MED ORDER — VITAMIN B-1 100 MG PO TABS
100.0000 mg | ORAL_TABLET | Freq: Every day | ORAL | Status: DC
  Administered 2017-09-24 – 2017-09-28 (×5): 100 mg via ORAL
  Filled 2017-09-23 (×5): qty 1

## 2017-09-23 MED ORDER — PANTOPRAZOLE SODIUM 40 MG PO PACK
40.0000 mg | PACK | Freq: Every day | ORAL | Status: DC
  Administered 2017-09-23 – 2017-09-27 (×5): 40 mg via ORAL
  Filled 2017-09-23 (×6): qty 20

## 2017-09-23 MED ORDER — POTASSIUM CHLORIDE 20 MEQ PO PACK
40.0000 meq | PACK | Freq: Once | ORAL | Status: AC
  Administered 2017-09-23: 40 meq via ORAL
  Filled 2017-09-23: qty 2

## 2017-09-23 MED ORDER — LITHIUM CARBONATE 300 MG PO CAPS
300.0000 mg | ORAL_CAPSULE | Freq: Three times a day (TID) | ORAL | Status: DC
Start: 2017-09-23 — End: 2017-09-28
  Administered 2017-09-23 – 2017-09-28 (×15): 300 mg via ORAL
  Filled 2017-09-23 (×17): qty 1

## 2017-09-23 MED ORDER — FAMOTIDINE 20 MG PO TABS
40.0000 mg | ORAL_TABLET | Freq: Every day | ORAL | Status: DC
  Administered 2017-09-23 – 2017-09-27 (×5): 40 mg via ORAL
  Filled 2017-09-23 (×5): qty 2

## 2017-09-23 MED ORDER — ALPRAZOLAM 0.5 MG PO TABS
0.5000 mg | ORAL_TABLET | Freq: Two times a day (BID) | ORAL | Status: DC
  Administered 2017-09-23 – 2017-09-28 (×11): 0.5 mg via ORAL
  Filled 2017-09-23 (×11): qty 1

## 2017-09-23 MED ORDER — VITAMIN B-1 100 MG PO TABS
100.0000 mg | ORAL_TABLET | Freq: Every day | ORAL | Status: DC
  Filled 2017-09-23: qty 1

## 2017-09-23 NOTE — Progress Notes (Signed)
Pt transferred to 5M17. Receiving RN to bedside upon arrival. Pt stable at transfer. Ambulated to bedside chair. Chair alarm applied and turned on. Wife and additional family present at time of transfer and has possession of belongings.

## 2017-09-23 NOTE — Progress Notes (Signed)
PULMONARY / CRITICAL CARE MEDICINE   Name: Jesus Martin MRN: [id number] DOB: July 05, 1961    ADMISSION DATE:  09/20/2017 CONSULTATION DATE:  09/20/17  REFERRING MD:  Jeanell Sparrow  CHIEF COMPLAINT:  AMS  HISTORY OF PRESENT ILLNESS:        This is a previously undiagnosed diabetic with a history of COPD and bipolar disorder who had not felt well for a week prior to admission.  He has been suffering from a "upset stomach" for the past week has had very little p.o. intake other than immense amounts of liquids.  He has been having liquid stool up until today.  He is also been having rigors at home.  He has not had any associated cough or more than his usual shortness of breath.  On arrival in the emergency room mental status was altered and he was not able to give any history.  During examination he is very agitated and history is obtained entirely from his wife.  PAST MEDICAL HISTORY :  He  has a past medical history of Bipolar I disorder ([place]) (1991), Borderline hyperlipidemia, Childhood asthma, COPD (chronic obstructive pulmonary disease) ([place]), Elevated glucose, Fibromyalgia, GERD (gastroesophageal reflux disease), HTN (hypertension), Lower back pain, Paranoia ([place]), Tobacco abuse, Tobacco abuse, and Vitamin D deficiency.  PAST SURGICAL HISTORY: He  has a past surgical history that includes Appendectomy; Back surgery; and Nose surgery.  Allergies  Allergen Reactions  . Yellow [place] Venom [Bee Venom] Anaphylaxis and Other (See Comments)    And the patient passes out    No current facility-administered medications on file prior to encounter.    Current Outpatient Medications on File Prior to Encounter  Medication Sig  . acetaminophen (TYLENOL) 325 MG tablet Take 325-650 mg by mouth every 6 (six) hours as needed (for pain or headaches).  . ALPRAZolam (XANAX) 1 MG tablet Take 2 mg by mouth every morning.  Marland Kitchen amLODipine (NORVASC) 5 MG tablet Take 5 mg by mouth daily.    . Cholecalciferol  (VITAMIN D-3) 1000 units CAPS Take 1,000 Units by mouth daily.  . diphenhydrAMINE (BENADRYL) 25 mg capsule Take 25 mg by mouth every 8 (eight) hours as needed for allergies.  Marland Kitchen EPINEPHrine (EPIPEN 2-PAK) 0.3 mg/0.3 mL IJ SOAJ injection Inject 0.3 mg into the muscle once as needed (for anaphylactic reaction to bee stings).  . lamoTRIgine (LAMICTAL) 150 MG tablet Take 150 mg by mouth 2 (two) times daily.  Marland Kitchen lisinopril-hydrochlorothiazide (PRINZIDE,ZESTORETIC) 20-12.5 MG per tablet Take 1 tablet by mouth daily.    Marland Kitchen lithium carbonate (ESKALITH) 450 MG CR tablet Take 450-675 mg by mouth See admin instructions. 675 mg by mouth in the morning and 450 mg at bedtime  . multivitamin (ONE-A-DAY MEN'S) TABS tablet Take 1 tablet by mouth daily.  Marland Kitchen omeprazole (PRILOSEC) 40 MG capsule Take 40 mg by mouth daily.  . ondansetron (ZOFRAN) 4 MG tablet Take 4 mg by mouth every 8 (eight) hours as needed for nausea or vomiting.  Marland Kitchen QUEtiapine (SEROQUEL) 400 MG tablet Take 200 mg by mouth at bedtime.  . traMADol (ULTRAM) 50 MG tablet Take 50 mg by mouth 2 (two) times daily as needed (for pain).   . TRELEGY ELLIPTA 100-62.5-25 MCG/INH AEPB Inhale 1 puff into the lungs daily.  . VENTOLIN HFA 108 (90 Base) MCG/ACT inhaler Inhale 2 puffs into the lungs every 6 (six) hours as needed for wheezing or shortness of breath.   . QUEtiapine (SEROQUEL) 50 MG tablet Take 200 mg by mouth at  bedtime.    FAMILY HISTORY:  His is adopted.    SOCIAL HISTORY: He  reports that he has been smoking cigarettes.  He has a 45.00 pack-year smoking history. His smokeless tobacco use includes snuff. He reports that he drinks about 8.4 oz of alcohol per week. He reports that he does not use drugs.  REVIEW OF SYSTEMS:   Endorses confusion. Endorses increased thirst. Denies CP, SOB, abdominal pain, nausea, vomiting, diarrhea  SUBJECTIVE:  Sitting up in bedside chair able to converse this morning, wife at bedside  VITAL SIGNS: BP (!) 131/97  (BP Location: Right Arm)   Pulse 96   Temp 98.6 F (37 C) (Oral)   Resp 14   Ht 6' 1"  (1.854 m)   Wt 253 lb 8.5 oz (115 kg)   SpO2 96%   BMI 33.45 kg/m   HEMODYNAMICS:    VENTILATOR SETTINGS:    INTAKE / OUTPUT: I/O last 3 completed shifts: In: 7102.9 [P.O.:1120; I.V.:5882.9; IV Piggyback:100] Out: 2325 [Urine:2325]  PHYSICAL EXAMINATION:  General: Well-nourished well-developed male, sitting up in chair HEENT: dry lips but MMM, PERRL, EOMI Neuro: Alert and oriented to person and place, not time. Moves all extremities x4 does not follow commands CV: RRR, no MRG PULM: even/non-labored, lungs bilaterally clear GI: soft, distended, hypoactive bowel sounds Extremities: warm/dry, no edema  Skin: no rashes or lesions Psych: tangential speech  LABS:  BMET Recent Labs  Lab 09/21/17 0312 09/22/17 0321 09/23/17 0632  NA 151* 154* 152*  K 4.1 3.6 3.4*  CL 111 117* 116*  CO2 29 25 26   BUN 28* 31* 13  CREATININE 2.00* 1.70* 1.12  GLUCOSE 184* 188* 181*    Electrolytes Recent Labs  Lab 09/20/17 2112  09/21/17 0312 09/22/17 0321 09/23/17 2878  CALCIUM 12.4*   < > 10.8* 9.7 9.4  MG 2.7*  --  2.5* 2.5*  --   PHOS 1.1*  --  3.3 2.8  --    < > = values in this interval not displayed.    CBC Recent Labs  Lab 09/20/17 2112 09/21/17 0312 09/23/17 0632  WBC 21.5* 18.3* 11.0*  HGB 16.0 14.2 12.7*  HCT 45.1 42.4 40.1  PLT 348 231 166    Coag's Recent Labs  Lab 09/20/17 1449  APTT 27  INR 1.03    Sepsis Markers Recent Labs  Lab 09/20/17 1800 09/21/17 0312 09/22/17 0321  PROCALCITON 0.40 0.64 0.14    ABG Recent Labs  Lab 09/20/17 1616  PHART 7.422  PCO2ART 46.5  PO2ART 59.0*    Liver Enzymes Recent Labs  Lab 09/20/17 1449  AST 211*  ALT 294*  ALKPHOS 140*  BILITOT 1.4*  ALBUMIN 4.8    Cardiac Enzymes No results for input(s): TROPONINI, PROBNP in the last 168 hours.  Glucose Recent Labs  Lab 09/22/17 0747 09/22/17 1115  09/22/17 1534 09/22/17 2001 09/22/17 2347 09/23/17 0405  GLUCAP 211* 242* 212* 193* 196* 172*    Imaging Dg Abd Portable 1v  Result Date: 09/22/2017 CLINICAL DATA:  Abdominal distention. EXAM: PORTABLE ABDOMEN - 1 VIEW COMPARISON:  CT abdomen dated November 07, 2015. FINDINGS: Borderline distended, air-filled ascending and transverse colon. No dilated small bowel loops. IMPRESSION: Mild gaseous distention of the ascending and transverse colon. Electronically Signed   By: Titus Dubin M.D.   On: 09/22/2017 14:38   STUDIES:  Head CT 2/11- no definite emergent large vessel occlusion or acute cortical infart, premature for age atrophy w/ chronic microvascular ischemic change, old  L frontal infarct RUQ [place] 2/11- negative for cholelithiasis. Diffuse hepatosteatosis  2/13 CXR- right midlung atelectasis  CULTURES: RVP negative Blood cx 2/11- 1 bottle + staph- likely contaminate  ANTIBIOTICS: Vancomycin 2/11 > 2/12 Cipro 2/11 > 2/12 Rocephin 2/11 >>  SIGNIFICANT EVENTS: none  LINES/TUBES: PIV   DISCUSSION:      This is a previously undiagnosed diabetic 57 year old male who presents with altered mental status and profoundly elevated sugar.  In addition he is febrile with a modest leukocytosis and abnormal LFTs.  ASSESSMENT / PLAN:  PULMONARY A: none P:   Monitor respiratory status  CARDIOVASCULAR A:  Hypotension- resolved P:  Fluids NS 150 cc/hr  RENAL A:   Elevated Cr Hypernatremia P:   Monitor BMP Monitor UOP Free water  GASTROINTESTINAL A:   Elevated LFTs ?constipation Elevated ammonia level P:   Trend CMP Lactulose enemas  HEMATOLOGIC A:   Leukocytosis- improving Anemia P:  Daily CBC  INFECTIOUS A:   Febrile Elevated Pro-cal P:   Follow cultures Continue CTX  ENDOCRINE A:   T2DM- A1C 11.6   P:   SSI moderate lantus 30 units qd  NEUROLOGIC A:   Delirium Bipolar disorder On lithium, seroquel, Xanax and lamictal at home P:    Lithium level WNL, restarted lithium Restraints Off precedex  Continue seroquel  FAMILY  - Updates: wife updated at bedside 2/14 - Inter-disciplinary family meet or Palliative Care meeting due by: 2/18  Lucila Maine, DO PGY-2, [facility] 09/23/2017 8:22 AM

## 2017-09-23 NOTE — Progress Notes (Signed)
  Speech Language Pathology Treatment: Dysphagia  Patient Details Name: Jesus Martin MRN: [id number] DOB: 13-Sep-1960 Today's Date: 09/23/2017 Time: 8295-6213 SLP Time Calculation (min) (ACUTE ONLY): 12 min  Assessment / Plan / Recommendation Clinical Impression  Pt presents with increased alertness and improved attention to PO trials. Pt had slight throat clear/vocalizations throughout session that persisted before and after PO trials, however no other indications concerning for aspiration noted this session. Pt trialed with regular solid but had increased mastication and reduced awareness of bolus. Pt required Mod verbal cues to clear oral cavity. Given hx of regurgitation during previous session and vomiting on admission, recommend starting clear liquid diet intially. However, given observed swallow function pt is appropriate to start Dys 1 and thin liquid diet with aspiration precautions and intermittent supervision, when medically appropriate. SLP will continue to follow to determine readiness to advance solid consistency.       HPI HPI: Pt is a 57 year old man with history of bipolar disease, alcohol abuse, diabetes (poorly controlled), GERD, and COPD, who presented with AMS in the setting of rigors, severe hyperglycemia, metabolic disarray, and abdominal discomfort. There is also concern for alcohol withdrawal.      SLP Plan  Continue with current plan of care       Recommendations  Diet recommendations: Dysphagia 1 (puree);Thin liquid Liquids provided via: Straw Medication Administration: Crushed with puree Supervision: Intermittent supervision to cue for compensatory strategies Compensations: Slow rate;Small sips/bites;Minimize environmental distractions Postural Changes and/or Swallow Maneuvers: Seated upright 90 degrees;Upright 30-60 min after meal                Oral Care Recommendations: Oral care BID Follow up Recommendations: Other (comment)(tba) SLP Visit  Diagnosis: Dysphagia, oropharyngeal phase (R13.12) Plan: Continue with current plan of care       GO              Jesus Martin SLP Student Clinician   Jesus Martin 09/23/2017, 9:07 AM

## 2017-09-24 LAB — COMPREHENSIVE METABOLIC PANEL
ALK PHOS: 125 U/L (ref 38–126)
ALT: 132 U/L — AB (ref 17–63)
AST: 107 U/L — AB (ref 15–41)
Albumin: 3.6 g/dL (ref 3.5–5.0)
Anion gap: 13 (ref 5–15)
BILIRUBIN TOTAL: 1.4 mg/dL — AB (ref 0.3–1.2)
BUN: 8 mg/dL (ref 6–20)
CO2: 23 mmol/L (ref 22–32)
Calcium: 10.1 mg/dL (ref 8.9–10.3)
Chloride: 114 mmol/L — ABNORMAL HIGH (ref 101–111)
Creatinine, Ser: 1.15 mg/dL (ref 0.61–1.24)
GFR calc Af Amer: >60 mL/min (ref >60)
Glucose, Bld: 172 mg/dL — ABNORMAL HIGH (ref 65–99)
Potassium: 3.3 mmol/L — ABNORMAL LOW (ref 3.5–5.1)
Sodium: 150 mmol/L — ABNORMAL HIGH (ref 135–145)
TOTAL PROTEIN: 6.5 g/dL (ref 6.5–8.1)

## 2017-09-24 LAB — GLUCOSE, CAPILLARY
Glucose-Capillary: 149 mg/dL — ABNORMAL HIGH (ref 65–99)
Glucose-Capillary: 151 mg/dL — ABNORMAL HIGH (ref 65–99)
Glucose-Capillary: 156 mg/dL — ABNORMAL HIGH (ref 65–99)
Glucose-Capillary: 161 mg/dL — ABNORMAL HIGH (ref 65–99)
Glucose-Capillary: 168 mg/dL — ABNORMAL HIGH (ref 65–99)
Glucose-Capillary: 177 mg/dL — ABNORMAL HIGH (ref 65–99)

## 2017-09-24 LAB — AMMONIA: AMMONIA: 41 umol/L — AB (ref 9–35)

## 2017-09-24 MED ORDER — LACTULOSE 10 GM/15ML PO SOLN
20.0000 g | Freq: Two times a day (BID) | ORAL | Status: DC
  Administered 2017-09-24 – 2017-09-27 (×7): 20 g via ORAL
  Filled 2017-09-24 (×7): qty 30

## 2017-09-24 MED ORDER — POTASSIUM CHLORIDE CRYS ER 20 MEQ PO TBCR
40.0000 meq | EXTENDED_RELEASE_TABLET | Freq: Every day | ORAL | Status: DC
  Administered 2017-09-24 – 2017-09-25 (×2): 40 meq via ORAL
  Filled 2017-09-24 (×2): qty 2

## 2017-09-24 NOTE — Progress Notes (Signed)
Spoke with patient and wife about the new onset of diabetes. States that he and his wife discussed with ICU nurses information about insulin and how to administer. Wife and patient have both given injections. Reminded them that he needs a home blood glucose meter, lancets, and strips at discharge. Reviewed HgbA1C of 11.7% and information given to them about diabetes control. Recommended that patient and wife watch the DM videos #501-510. Has received other information about diet, blood sugar control, and rotating sites of insulin. Will continue to monitor blood sugars while in the hospital. Will need to follow up with PCP at discharge.  Harvel Ricks RN BSN CDE Diabetes Coordinator Pager: [phone]  8am-5pm

## 2017-09-24 NOTE — Progress Notes (Signed)
Hospitalist progress note   DEAGEN KRASS  [id number] DOB: 05/03/1961 DOA: 09/20/2017 PCP: Dianna Rossetti, NP   Specialists:   Brief Narrative:  57 year old male COPD, bipolar, tobacco abuse, HTN admitted by critical care medicine 09/20/17 with altered mental status?  Upset stomach liquid stool rigor's-mentation altered on admission-started empirically on community-acquired pneumonia coverage, 2/2 ? LFTs , ammonia- lactulose enemas- ammonia 69 workup also included [-] respiratory panel Placed on Precedex and was weaned off-LFTs and other labs rechecked and was started on home Xanax home Lamictal  Assessment & Plan:   Assessment:  The primary encounter diagnosis was Altered mental status, unspecified altered mental status type. Diagnoses of Hypoxia, Abnormal LFTs, Diabetes mellitus, new onset ([place]), Hyperglycemia, Respiratory failure ([place]), Abdominal distention, and Distended abdomen were also pertinent to this visit.  Toxic metabolic encephalopathy-resolving-restarted Lamictal 150 twice daily lithium 300 3 times daily Seroquel 200 at bedtime Xanax 0.5 twice daily  Possible Karlene Lineman given elevated ammonia, transaminitis AST ALT 211/294 alk phos 140 on admit-lactulose enemas on hold give 20 mg twice daily and recheck  Nonspecific abdominal pain, abdominal x-ray negative on admission-has a known adrenal adenoma which was supposed to be characterized 10/2016 if persisting recurrent pain would consider repetition of the same-he has been told by primary care physician that these are spasm-like pains  HONK vs dKA on admission-sugar was 1212?-Anion gap 20 bicarb was normal unclear if this had a bearing on his confusion-started on Lantus 30 daily and is on SSI moderate coverage-sugars are 150-170--diabetes coordinator to weigh in-favor the use of 7030 insulin given ease of administration and less burden of injection He is also having some numbness of his fingers 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 secondary to osmotic diuresis on admit-seems to be resolving after IV saline repletion saline locked  Hypernatremia-could have something to do with poor vol intake-forcing fluid today at least 2-3 L and liberalize to diabetic diet  Community-acquired pneumonia completing 2 more doses of ceftriaxone ending on 2/16 a.m.  Hypokalemia 3.3-replace with K Dur 40 daily  Reflux continue pantoprazole 40 at bedtime  DVT prophylaxis: lovenox  Code Status:   IP    Family Communication:   none  Disposition Plan: inpatient pedning resolution and teaching of dm process   Consultants:   None-assumed form CCM  Procedures:   none  Antimicrobials:   none   Subjective:  Multiple complaints hand pain and numbness in addition to abdominal pain no vomiting no fever is a little confused No chest pain No blurred vision at this time  Objective: Vitals:   09/23/17 1600 09/23/17 1701 09/23/17 2059 09/24/17 0410  BP: (!) 145/87 130/78 (!) 145/86 (!) 145/86  Pulse:  88 89 94  Resp: 17 18 17 19   Temp:  98.2 F (36.8 C) 98.6 F (37 C) 98.3 F (36.8 C)  TempSrc:  Oral Oral Oral  SpO2: 97% 98% 94% 98%  Weight:      Height:        Intake/Output Summary (Last 24 hours) at 09/24/2017 0826 Last data filed at 09/24/2017 0600 Gross per 24 hour  Intake 900 ml  Output 0 ml  Net 900 ml   Filed Weights   09/21/17 0500 09/22/17 0440 09/23/17 0353  Weight: 113.2 kg (249 lb 9 oz) 113 kg (249 lb 1.9 oz) 115 kg (253 lb 8.5 oz)    Examination:  Alert  slightly confused at times Obese mildly tender in the right upper quadrant although no rebound no guarding although EOMI NCAT no icterus no lower extremity edema Neurologically intact   Data Reviewed: I have personally reviewed following labs and imaging studies  CBC: Recent Labs  Lab 09/20/17 1449 09/20/17 1456  09/20/17 2112 09/21/17 0312 09/23/17 0632  WBC 12.9*  --  21.5* 18.3* 11.0*  NEUTROABS 10.4*  --   --   --   --   HGB 15.7 16.3 16.0 14.2 12.7*  HCT 47.0 48.0 45.1 42.4 40.1  MCV 99.6  --  93.6 96.8 100.3*  PLT 296  --  348 231 811   Basic Metabolic Panel: Recent Labs  Lab 09/20/17 2112 09/21/17 0019 09/21/17 0312 09/22/17 0321 09/23/17 0632 09/24/17 0619  NA 151* 151* 151* 154* 152* 150*  K 3.6 4.0 4.1 3.6 3.4* 3.3*  CL 105 112* 111 117* 116* 114*  CO2 30 28 29 25 26 23   GLUCOSE 345* 182* 184* 188* 181* 172*  BUN 22* 25* 28* 31* 13 8  CREATININE 1.40* 1.96* 2.00* 1.70* 1.12 1.15  CALCIUM 12.4* 11.1* 10.8* 9.7 9.4 10.1  MG 2.7*  --  2.5* 2.5*  --   --   PHOS 1.1*  --  3.3 2.8  --   --    GFR: Estimated Creatinine Clearance: 95.3 mL/min (by C-G formula based on SCr of 1.15 mg/dL). Liver Function Tests: Recent Labs  Lab 09/20/17 1449 09/24/17 0619  AST 211* 107*  ALT 294* 132*  ALKPHOS 140* 125  BILITOT 1.4* 1.4*  PROT 8.3* 6.5  ALBUMIN 4.8 3.6   Recent Labs  Lab 09/20/17 1800  LIPASE 39  AMYLASE 26*   Recent Labs  Lab 09/20/17 2205 09/21/17 0312 09/24/17 0619  AMMONIA 63* 69* 41*   Coagulation Profile: Recent Labs  Lab 09/20/17 1449  INR 1.03   Cardiac Enzymes: No results for input(s): CKTOTAL, CKMB, CKMBINDEX, TROPONINI in the last 168 hours. CBG: Recent Labs  Lab 09/23/17 1706 09/23/17 2039 09/24/17 0058 09/24/17 0408 09/24/17 0743  GLUCAP 206* 171* 151* 156* 161*   Urine analysis:    Component Value Date/Time   COLORURINE STRAW (A) 09/20/2017 1610   APPEARANCEUR CLEAR 09/20/2017 1610   LABSPEC 1.024 09/20/2017 1610   PHURINE 7.0 09/20/2017 1610   GLUCOSEU >=500 (A) 09/20/2017 1610   HGBUR SMALL (A) 09/20/2017 1610   BILIRUBINUR NEGATIVE 09/20/2017 1610   BILIRUBINUR NEG 06/24/2011 1520   KETONESUR NEGATIVE 09/20/2017 1610   PROTEINUR NEGATIVE 09/20/2017 1610   UROBILINOGEN 0.2 06/24/2011 1520   NITRITE NEGATIVE 09/20/2017  1610   LEUKOCYTESUR NEGATIVE 09/20/2017 1610     Radiology Studies: Reviewed images personally in health database    Scheduled Meds: . ALPRAZolam  0.5 mg Oral BID  . famotidine  40 mg Oral QHS  . heparin  5,000 Units Subcutaneous Q8H  . insulin aspart  0-15 Units Subcutaneous Q4H  . insulin glargine  30 Units Subcutaneous Q24H  . lamoTRIgine  150 mg Oral BID  . lithium carbonate  300 mg Oral TID  . pantoprazole sodium  40 mg Oral QHS  . QUEtiapine  200 mg Oral QHS  . thiamine  100 mg Oral Daily   Continuous Infusions: . cefTRIAXone (ROCEPHIN)  IV Stopped (09/23/17 0918)     LOS: 4 days    Time spent: Stevenson, MD [facility] ([facility]   If 7PM-7AM, please contact night-coverage [email or web address] Password [facility] 09/24/2017,  8:26 AM

## 2017-09-24 NOTE — Care Management Important Message (Signed)
Important Message  Patient Details  Name: Jesus Martin MRN: [id number] Date of Birth: [birth year]   Medicare Important Message Given:  Yes    Orbie Pyo 09/24/2017, 12:23 PM

## 2017-09-25 DIAGNOSIS — R0902 Hypoxemia: Secondary | ICD-10-CM

## 2017-09-25 LAB — COMPREHENSIVE METABOLIC PANEL
ALBUMIN: 3.4 g/dL — AB (ref 3.5–5.0)
ALT: 129 U/L — ABNORMAL HIGH (ref 17–63)
AST: 106 U/L — ABNORMAL HIGH (ref 15–41)
Alkaline Phosphatase: 141 U/L — ABNORMAL HIGH (ref 38–126)
Anion gap: 13 (ref 5–15)
BILIRUBIN TOTAL: 1.4 mg/dL — AB (ref 0.3–1.2)
BUN: 8 mg/dL (ref 6–20)
CO2: 22 mmol/L (ref 22–32)
Calcium: 10 mg/dL (ref 8.9–10.3)
Chloride: 116 mmol/L — ABNORMAL HIGH (ref 101–111)
Creatinine, Ser: 1.04 mg/dL (ref 0.61–1.24)
GFR calc Af Amer: >60 mL/min (ref >60)
GFR calc non Af Amer: >60 mL/min (ref >60)
GLUCOSE: 168 mg/dL — AB (ref 65–99)
POTASSIUM: 3 mmol/L — AB (ref 3.5–5.1)
Sodium: 151 mmol/L — ABNORMAL HIGH (ref 135–145)
TOTAL PROTEIN: 6.5 g/dL (ref 6.5–8.1)

## 2017-09-25 LAB — CULTURE, BLOOD (ROUTINE X 2): CULTURE: NO GROWTH

## 2017-09-25 LAB — GLUCOSE, CAPILLARY
GLUCOSE-CAPILLARY: 149 mg/dL — AB (ref 65–99)
GLUCOSE-CAPILLARY: 173 mg/dL — AB (ref 65–99)
Glucose-Capillary: 124 mg/dL — ABNORMAL HIGH (ref 65–99)
Glucose-Capillary: 164 mg/dL — ABNORMAL HIGH (ref 65–99)
Glucose-Capillary: 182 mg/dL — ABNORMAL HIGH (ref 65–99)
Glucose-Capillary: 200 mg/dL — ABNORMAL HIGH (ref 65–99)

## 2017-09-25 LAB — AMMONIA: Ammonia: 42 umol/L — ABNORMAL HIGH (ref 9–35)

## 2017-09-25 MED ORDER — DEXTROSE IN LACTATED RINGERS 5 % IV SOLN
INTRAVENOUS | Status: DC
  Administered 2017-09-25: 11:00:00 via INTRAVENOUS

## 2017-09-25 MED ORDER — POTASSIUM CHLORIDE CRYS ER 20 MEQ PO TBCR
40.0000 meq | EXTENDED_RELEASE_TABLET | Freq: Two times a day (BID) | ORAL | Status: DC
  Administered 2017-09-25 – 2017-09-27 (×4): 40 meq via ORAL
  Filled 2017-09-25 (×4): qty 2

## 2017-09-25 MED ORDER — INSULIN ASPART PROT & ASPART (70-30 MIX) 100 UNIT/ML ~~LOC~~ SUSP
15.0000 [IU] | Freq: Two times a day (BID) | SUBCUTANEOUS | Status: DC
Start: 2017-09-25 — End: 2017-09-28
  Administered 2017-09-25 – 2017-09-28 (×6): 15 [IU] via SUBCUTANEOUS
  Filled 2017-09-25: qty 10

## 2017-09-25 NOTE — Progress Notes (Signed)
Hospitalist progress note   Jesus Martin  [id number]:[id number] DOB: 1960-12-17 DOA: 09/20/2017 PCP: Dianna Rossetti, NP   Specialists:   Brief Narrative:  57 year old male COPD, bipolar, tobacco abuse, HTN admitted by critical care medicine 09/20/17 with altered mental status?  Upset stomach liquid stool rigor's-mentation altered on admission-started empirically on community-acquired pneumonia coverage, 2/2 ? LFTs , ammonia- lactulose enemas- ammonia 69 workup also included [-] respiratory panel Placed on Precedex and was weaned off-LFTs and other labs rechecked and was started on home Xanax home Lamictal  Assessment & Plan:   Assessment:  The primary encounter diagnosis was Altered mental status, unspecified altered mental status type. Diagnoses of Hypoxia, Abnormal LFTs, Diabetes mellitus, new onset ([place]), Hyperglycemia, Respiratory failure ([place]), Abdominal distention, Distended abdomen, and Cholelithiases were also pertinent to this visit.  Toxic metabolic encephalopathy-resolving-restarted Lamictal 150 twice daily lithium 300 3 times daily Seroquel 200 at bedtime Xanax 0.5 twice daily  Possible Karlene Lineman given elevated ammonia, transaminitis AST ALT 211/294 alk phos 140 on admit-lactulose enemas on hold give oral Lactulose 20 mg twice daily-get [place] abd pevlis r/o cholelothiasis and get hepatitis panel  Nonspecific abdominal pain, abdominal x-ray negative on admission-has a known adrenal adenoma which was supposed to be characterized 10/2016 if persisting recurrent pain would consider repetition of the same-he has been told by primary care physician that these are spasm-like pains   HONK vs dKA on admission-sugar was 1212?-Anion gap 20 bicarb was normal unclear if this had a bearing on his confusion-started on Lantus 30 daily and is on SSI moderate coverage-sugars are 124-182--changing to 70/30 insulin 15 u bid He is also having some numbness of his fingers but did work in [employer]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 secondary to osmotic diuresis on admit-seems to be resolving after IV saline repletion saline locked  Hypernatremia-could have something to do with poor vol intake-start lr-d5 50cc/h and recheck labs ain am  Community-acquired pneumonia completing 2 more doses of ceftriaxone ending on 2/16 a.m.  Hypokalemia-replace with K Dur 40 daily-->bid 2/16 as K 3.0  Reflux continue pantoprazole 40 at bedtime  DVT prophylaxis: lovenox  Code Status:   IP    Family Communication:   none  Disposition Plan: inpatient pedning reslution electrolyte anomaly and insulin teeaching-suspect 2-3 days   Consultants:   None-assumed form CCM  Procedures:   none  Antimicrobials:   none   Subjective:  Awake alert some abd pain this am No cp No fever no chills no n/v   Objective: Vitals:   09/24/17 1657 09/24/17 1946 09/25/17 0627 09/25/17 0943  BP: (!) 141/91 (!) 145/84 (!) 142/91 140/89  Pulse: 88 86 (!) 102 100  Resp: 18 18 18 10   Temp: 98.3 F (36.8 C) 98.2 F (36.8 C) 97.7 F (36.5 C) 97.6 F (36.4 C)  TempSrc: Oral Oral Oral Oral  SpO2: 100% 99% 97% 98%  Weight:      Height:        Intake/Output Summary (Last 24 hours) at 09/25/2017 1428 Last data filed at 09/25/2017 0900 Gross per 24 hour  Intake 480 ml  Output 0 ml  Net 480 ml   Filed Weights   09/21/17 0500 09/22/17 0440 09/23/17 0353  Weight: 113.2 kg (249 lb 9 oz) 113 kg (249 lb 1.9 oz) 115 kg (253 lb 8.5 oz)    Examination:  Alert less confused overall no chills no rigors Obese mildly tender L chest  with some rad acorss epigastrium- no rebound no guarding although EOMI NCAT no icterus no lower extremity edema Neurologically intact   Data Reviewed: I have personally reviewed following labs and imaging studies  CBC: Recent Labs  Lab 09/20/17 1449 09/20/17 1456 09/20/17 2112 09/21/17 0312 09/23/17 0632   WBC 12.9*  --  21.5* 18.3* 11.0*  NEUTROABS 10.4*  --   --   --   --   HGB 15.7 16.3 16.0 14.2 12.7*  HCT 47.0 48.0 45.1 42.4 40.1  MCV 99.6  --  93.6 96.8 100.3*  PLT 296  --  348 231 696   Basic Metabolic Panel: Recent Labs  Lab 09/20/17 2112  09/21/17 0312 09/22/17 0321 09/23/17 0632 09/24/17 0619 09/25/17 0530  NA 151*   < > 151* 154* 152* 150* 151*  K 3.6   < > 4.1 3.6 3.4* 3.3* 3.0*  CL 105   < > 111 117* 116* 114* 116*  CO2 30   < > 29 25 26 23 22   GLUCOSE 345*   < > 184* 188* 181* 172* 168*  BUN 22*   < > 28* 31* 13 8 8   CREATININE 1.40*   < > 2.00* 1.70* 1.12 1.15 1.04  CALCIUM 12.4*   < > 10.8* 9.7 9.4 10.1 10.0  MG 2.7*  --  2.5* 2.5*  --   --   --   PHOS 1.1*  --  3.3 2.8  --   --   --    < > = values in this interval not displayed.   GFR: Estimated Creatinine Clearance: 105.3 mL/min (by C-G formula based on SCr of 1.04 mg/dL). Liver Function Tests: Recent Labs  Lab 09/20/17 1449 09/24/17 0619 09/25/17 0530  AST 211* 107* 106*  ALT 294* 132* 129*  ALKPHOS 140* 125 141*  BILITOT 1.4* 1.4* 1.4*  PROT 8.3* 6.5 6.5  ALBUMIN 4.8 3.6 3.4*   Recent Labs  Lab 09/20/17 1800  LIPASE 39  AMYLASE 26*   Recent Labs  Lab 09/20/17 2205 09/21/17 0312 09/24/17 0619 09/25/17 0925  AMMONIA 63* 69* 41* 42*   Coagulation Profile: Recent Labs  Lab 09/20/17 1449  INR 1.03   Cardiac Enzymes: No results for input(s): CKTOTAL, CKMB, CKMBINDEX, TROPONINI in the last 168 hours. CBG: Recent Labs  Lab 09/24/17 2149 09/25/17 0135 09/25/17 0426 09/25/17 0802 09/25/17 1201  GLUCAP 177* 124* 164* 149* 182*   Urine analysis:    Component Value Date/Time   COLORURINE STRAW (A) 09/20/2017 1610   APPEARANCEUR CLEAR 09/20/2017 1610   LABSPEC 1.024 09/20/2017 1610   PHURINE 7.0 09/20/2017 1610   GLUCOSEU >=500 (A) 09/20/2017 1610   HGBUR SMALL (A) 09/20/2017 1610   BILIRUBINUR NEGATIVE 09/20/2017 1610   BILIRUBINUR NEG 06/24/2011 1520   KETONESUR NEGATIVE  09/20/2017 1610   PROTEINUR NEGATIVE 09/20/2017 1610   UROBILINOGEN 0.2 06/24/2011 1520   NITRITE NEGATIVE 09/20/2017 1610   LEUKOCYTESUR NEGATIVE 09/20/2017 1610     Radiology Studies: Reviewed images personally in health database    Scheduled Meds: . ALPRAZolam  0.5 mg Oral BID  . famotidine  40 mg Oral QHS  . heparin  5,000 Units Subcutaneous Q8H  . insulin aspart  0-15 Units Subcutaneous Q4H  . insulin glargine  30 Units Subcutaneous Q24H  . lactulose  20 g Oral BID  . lamoTRIgine  150 mg Oral BID  . lithium carbonate  300 mg Oral TID  . pantoprazole sodium  40 mg Oral QHS  .  potassium chloride  40 mEq Oral BID  . QUEtiapine  200 mg Oral QHS  . thiamine  100 mg Oral Daily   Continuous Infusions: . dextrose 5% lactated ringers 50 mL/hr at 09/25/17 1032     LOS: 5 days    Time spent: [place], MD [facility] ([facility]   If 7PM-7AM, please contact night-coverage [email or web address] Password [facility] 09/25/2017, 2:28 PM

## 2017-09-26 ENCOUNTER — Inpatient Hospital Stay (HOSPITAL_COMMUNITY): Payer: Medicare HMO

## 2017-09-26 LAB — GLUCOSE, CAPILLARY
GLUCOSE-CAPILLARY: 153 mg/dL — AB (ref 65–99)
Glucose-Capillary: 189 mg/dL — ABNORMAL HIGH (ref 65–99)
Glucose-Capillary: 160 mg/dL — ABNORMAL HIGH (ref 65–99)
Glucose-Capillary: 162 mg/dL — ABNORMAL HIGH (ref 65–99)
Glucose-Capillary: 172 mg/dL — ABNORMAL HIGH (ref 65–99)
Glucose-Capillary: 167 mg/dL — ABNORMAL HIGH (ref 65–99)

## 2017-09-26 LAB — COMPREHENSIVE METABOLIC PANEL
ALK PHOS: 129 U/L — AB (ref 38–126)
ALT: 116 U/L — AB (ref 17–63)
ANION GAP: 10 (ref 5–15)
AST: 97 U/L — AB (ref 15–41)
Albumin: 3.4 g/dL — ABNORMAL LOW (ref 3.5–5.0)
BILIRUBIN TOTAL: 1 mg/dL (ref 0.3–1.2)
BUN: 6 mg/dL (ref 6–20)
CALCIUM: 9.9 mg/dL (ref 8.9–10.3)
CO2: 26 mmol/L (ref 22–32)
CREATININE: 1.13 mg/dL (ref 0.61–1.24)
Chloride: 112 mmol/L — ABNORMAL HIGH (ref 101–111)
GFR calc Af Amer: >60 mL/min (ref >60)
GFR calc non Af Amer: >60 mL/min (ref >60)
GLUCOSE: 206 mg/dL — AB (ref 65–99)
Potassium: 3.2 mmol/L — ABNORMAL LOW (ref 3.5–5.1)
Sodium: 148 mmol/L — ABNORMAL HIGH (ref 135–145)
TOTAL PROTEIN: 6.2 g/dL — AB (ref 6.5–8.1)

## 2017-09-26 MED ORDER — ACETAMINOPHEN 500 MG PO TABS
500.0000 mg | ORAL_TABLET | Freq: Four times a day (QID) | ORAL | Status: DC | PRN
  Filled 2017-09-26: qty 1

## 2017-09-26 MED ORDER — DICLOFENAC SODIUM 1 % TD GEL
2.0000 g | Freq: Four times a day (QID) | TRANSDERMAL | Status: DC
  Administered 2017-09-26 – 2017-09-28 (×7): 2 g via TOPICAL
  Filled 2017-09-26: qty 100

## 2017-09-26 NOTE — Progress Notes (Signed)
Hospitalist progress note   Jesus Martin  XYV:[id number] DOB: [birth year] DOA: 09/20/2017 PCP: Dianna Rossetti, NP   Specialists:   Brief Narrative:  57 year old male COPD, bipolar, tobacco abuse, HTN admitted by critical care medicine 09/20/17 with altered mental status?  Upset stomach liquid stool rigor's-mentation altered on admission-started empirically on community-acquired pneumonia coverage, 2/2 ? LFTs , ammonia- lactulose enemas- ammonia 69 workup also included [-] respiratory panel Placed on Precedex and was weaned off-LFTs and other labs rechecked and was started on home Xanax home Lamictal  Assessment & Plan:   Assessment:  The primary encounter diagnosis was Altered mental status, unspecified altered mental status type. Diagnoses of Hypoxia, Abnormal LFTs, Diabetes mellitus, new onset ([place]), Hyperglycemia, Respiratory failure ([place]), Abdominal distention, Distended abdomen, and Cholelithiases were also pertinent to this visit.  Toxic metabolic encephalopathy-resolving-restarted Lamictal 150 twice daily lithium 300 3 times daily Seroquel 200 at bedtime Xanax 0.5 twice daily Metabolic encephalopathy possibly related to hyperammonemia and Karlene Lineman as below versus honk  Neck pain and discomfort with headache-we will prescribe IcyHot for the patient and can use limited amounts of Tylenol 500 every 4 as needed-he typically does have these at home  Possible Karlene Lineman given elevated ammonia, transaminitis AST ALT 211/294 alk phos 140 on admit-lactulose enema and held give oral Lactulose 20 mg twice daily-labs pending from this morning, ultrasound done but pending  Nonspecific abdominal pain, abdominal x-ray negative on admission-has a known adrenal adenoma which was supposed to be characterized 10/2016 --repeat ultrasound abdomen pelvis pending  HONK vs dKA on admission-sugar was 1212-Anion gap 20 bicarb was normal unclear if this had a bearing on his confusion- Lantus 30 daily/SSI moderate  coverage-sugars are 124-182--changing to 70/30 insulin 15 u bid No complaints of numbness today  AKI BUN/creatinine peaked 28/2 ? secondary to osmotic diuresis on admit-resolved 8/1.04 on 2/16  Hypernatremia-could have something to do with poor vol intake-start lr-d5 50cc/h-repeat labs are still pending  Community-acquired pneumonia completing 2 more doses of ceftriaxone ending on 2/16 a.m.  Hypokalemia-replace with K Dur 40 daily-->bid 2/16 as K 3.0  Reflux continue pantoprazole 40 at bedtime  DVT prophylaxis: lovenox  Code Status:   IP    Family Communication:   I have updated wife on multiple days Disposition Plan: await improvement of labs, ultrasound abdomen pelvis   Consultants:   None-assumed form CCM  Procedures:   none  Antimicrobials:   none   Subjective:  Some abdominal pain yesterday No chills no Rigor--main issue today is headache in the back of the neck which she uses icy hot for Eating meals and seems to be more conversant and clear although still occasional confusion Had an accident while passing stool Also has nocturia but has never been diagnosed with any prostatic issues   Objective: Vitals:   09/25/17 1815 09/25/17 2107 09/26/17 0421 09/26/17 0445  BP: (!) 144/82 (!) 162/80 133/79   Pulse: (!) 105 85 91   Resp: 15 16 16    Temp: 98 F (36.7 C) 98.2 F (36.8 C) 98 F (36.7 C)   TempSrc: Oral Oral Oral   SpO2: 98% 98% 96%   Weight:  115.2 kg (254 lb)  115.2 kg (253 lb 15.5 oz)  Height:        Intake/Output Summary (Last 24 hours) at 09/26/2017 0933 Last data filed at 09/26/2017 0601 Gross per 24 hour  Intake 2054.17 ml  Output 250 ml  Net 1804.17 ml   Filed Weights   09/23/17 0353 09/25/17 2107 09/26/17  0445  Weight: 115 kg (253 lb 8.5 oz) 115.2 kg (254 lb) 115.2 kg (253 lb 15.5 oz)    Examination:  Alert less confused overall no chills no rigors No tenderness in abdomen no rebound Chest is clinically clear without added sound No  lower extremity edema No overall swelling Neurologically intact moving all 4 limbs equally Euthymic   Data Reviewed: I have personally reviewed following labs and imaging studies  CBC: Recent Labs  Lab 09/20/17 1449 09/20/17 1456 09/20/17 2112 09/21/17 0312 09/23/17 0632  WBC 12.9*  --  21.5* 18.3* 11.0*  NEUTROABS 10.4*  --   --   --   --   HGB 15.7 16.3 16.0 14.2 12.7*  HCT 47.0 48.0 45.1 42.4 40.1  MCV 99.6  --  93.6 96.8 100.3*  PLT 296  --  348 231 024   Basic Metabolic Panel: Recent Labs  Lab 09/20/17 2112  09/21/17 0312 09/22/17 0321 09/23/17 0632 09/24/17 0619 09/25/17 0530  NA 151*   < > 151* 154* 152* 150* 151*  K 3.6   < > 4.1 3.6 3.4* 3.3* 3.0*  CL 105   < > 111 117* 116* 114* 116*  CO2 30   < > 29 25 26 23 22   GLUCOSE 345*   < > 184* 188* 181* 172* 168*  BUN 22*   < > 28* 31* 13 8 8   CREATININE 1.40*   < > 2.00* 1.70* 1.12 1.15 1.04  CALCIUM 12.4*   < > 10.8* 9.7 9.4 10.1 10.0  MG 2.7*  --  2.5* 2.5*  --   --   --   PHOS 1.1*  --  3.3 2.8  --   --   --    < > = values in this interval not displayed.   GFR: Estimated Creatinine Clearance: 105.4 mL/min (by C-G formula based on SCr of 1.04 mg/dL). Liver Function Tests: Recent Labs  Lab 09/20/17 1449 09/24/17 0619 09/25/17 0530  AST 211* 107* 106*  ALT 294* 132* 129*  ALKPHOS 140* 125 141*  BILITOT 1.4* 1.4* 1.4*  PROT 8.3* 6.5 6.5  ALBUMIN 4.8 3.6 3.4*   Recent Labs  Lab 09/20/17 1800  LIPASE 39  AMYLASE 26*   Recent Labs  Lab 09/20/17 2205 09/21/17 0312 09/24/17 0619 09/25/17 0925  AMMONIA 63* 69* 41* 42*   Coagulation Profile: Recent Labs  Lab 09/20/17 1449  INR 1.03   Cardiac Enzymes: No results for input(s): CKTOTAL, CKMB, CKMBINDEX, TROPONINI in the last 168 hours. CBG: Recent Labs  Lab 09/25/17 1201 09/25/17 1731 09/25/17 2101 09/26/17 0140 09/26/17 0419  GLUCAP 182* 200* 173* 189* 160*   Urine analysis:    Component Value Date/Time   COLORURINE STRAW (A)  09/20/2017 1610   APPEARANCEUR CLEAR 09/20/2017 1610   LABSPEC 1.024 09/20/2017 1610   PHURINE 7.0 09/20/2017 1610   GLUCOSEU >=500 (A) 09/20/2017 1610   HGBUR SMALL (A) 09/20/2017 1610   BILIRUBINUR NEGATIVE 09/20/2017 1610   BILIRUBINUR NEG 06/24/2011 1520   KETONESUR NEGATIVE 09/20/2017 1610   PROTEINUR NEGATIVE 09/20/2017 1610   UROBILINOGEN 0.2 06/24/2011 1520   NITRITE NEGATIVE 09/20/2017 1610   LEUKOCYTESUR NEGATIVE 09/20/2017 1610     Radiology Studies: Reviewed images personally in health database    Scheduled Meds: . ALPRAZolam  0.5 mg Oral BID  . diclofenac sodium  2 g Topical QID  . famotidine  40 mg Oral QHS  . heparin  5,000 Units Subcutaneous Q8H  . insulin aspart  protamine- aspart  15 Units Subcutaneous BID WC  . lactulose  20 g Oral BID  . lamoTRIgine  150 mg Oral BID  . lithium carbonate  300 mg Oral TID  . pantoprazole sodium  40 mg Oral QHS  . potassium chloride  40 mEq Oral BID  . QUEtiapine  200 mg Oral QHS  . thiamine  100 mg Oral Daily   Continuous Infusions: . dextrose 5% lactated ringers 50 mL/hr at 09/25/17 1032     LOS: 6 days    Time spent: [place], MD [facility] ([facility]   If 7PM-7AM, please contact night-coverage [email or web address] Password [facility] 09/26/2017, 9:33 AM

## 2017-09-27 LAB — COMPREHENSIVE METABOLIC PANEL
ALT: 106 U/L — AB (ref 17–63)
ANION GAP: 10 (ref 5–15)
AST: 84 U/L — ABNORMAL HIGH (ref 15–41)
Albumin: 3.3 g/dL — ABNORMAL LOW (ref 3.5–5.0)
Alkaline Phosphatase: 114 U/L (ref 38–126)
BUN: 5 mg/dL — ABNORMAL LOW (ref 6–20)
CHLORIDE: 112 mmol/L — AB (ref 101–111)
CO2: 26 mmol/L (ref 22–32)
CREATININE: 1.05 mg/dL (ref 0.61–1.24)
Calcium: 9.5 mg/dL (ref 8.9–10.3)
Glucose, Bld: 137 mg/dL — ABNORMAL HIGH (ref 65–99)
Potassium: 3.2 mmol/L — ABNORMAL LOW (ref 3.5–5.1)
Sodium: 148 mmol/L — ABNORMAL HIGH (ref 135–145)
Total Bilirubin: 1.1 mg/dL (ref 0.3–1.2)
Total Protein: 6.5 g/dL (ref 6.5–8.1)

## 2017-09-27 LAB — HEPATITIS PANEL, ACUTE
HCV Ab: 0.1 s/co ratio (ref 0.0–0.9)
HEP B S AG: NEGATIVE
Hep A IgM: NEGATIVE
Hep B C IgM: NEGATIVE

## 2017-09-27 LAB — CBC WITH DIFFERENTIAL/PLATELET
BASOS PCT: 0 %
Basophils Absolute: 0 10*3/uL (ref 0.0–0.1)
EOS ABS: 0.4 10*3/uL (ref 0.0–0.7)
Eosinophils Relative: 5 %
HCT: 36 % — ABNORMAL LOW (ref 39.0–52.0)
HEMOGLOBIN: 11.7 g/dL — AB (ref 13.0–17.0)
LYMPHS ABS: 3.5 10*3/uL (ref 0.7–4.0)
Lymphocytes Relative: 42 %
MCH: 32.4 pg (ref 26.0–34.0)
MCHC: 32.5 g/dL (ref 30.0–36.0)
MCV: 99.7 fL (ref 78.0–100.0)
Monocytes Absolute: 0.8 10*3/uL (ref 0.1–1.0)
Monocytes Relative: 9 %
NEUTROS PCT: 44 %
Neutro Abs: 3.6 10*3/uL (ref 1.7–7.7)
Platelets: 184 10*3/uL (ref 150–400)
RBC: 3.61 MIL/uL — AB (ref 4.22–5.81)
RDW: 12.8 % (ref 11.5–15.5)
WBC: 8.3 10*3/uL (ref 4.0–10.5)

## 2017-09-27 LAB — GLUCOSE, CAPILLARY
GLUCOSE-CAPILLARY: 132 mg/dL — AB (ref 65–99)
GLUCOSE-CAPILLARY: 176 mg/dL — AB (ref 65–99)
GLUCOSE-CAPILLARY: 164 mg/dL — AB (ref 65–99)
Glucose-Capillary: 139 mg/dL — ABNORMAL HIGH (ref 65–99)
Glucose-Capillary: 162 mg/dL — ABNORMAL HIGH (ref 65–99)

## 2017-09-27 LAB — AMMONIA: AMMONIA: 37 umol/L — AB (ref 9–35)

## 2017-09-27 MED ORDER — DEXTROSE IN LACTATED RINGERS 5 % IV SOLN: INTRAVENOUS | Status: AC

## 2017-09-27 MED ORDER — LACTULOSE 10 GM/15ML PO SOLN
30.0000 g | Freq: Two times a day (BID) | ORAL | Status: DC
  Administered 2017-09-27 – 2017-09-28 (×2): 30 g via ORAL
  Filled 2017-09-27 (×2): qty 45

## 2017-09-27 MED ORDER — POTASSIUM CHLORIDE CRYS ER 20 MEQ PO TBCR
80.0000 meq | EXTENDED_RELEASE_TABLET | Freq: Two times a day (BID) | ORAL | Status: DC
  Administered 2017-09-27 – 2017-09-28 (×2): 80 meq via ORAL
  Filled 2017-09-27 (×2): qty 4

## 2017-09-27 NOTE — Progress Notes (Signed)
Hospitalist progress note   Jesus Martin  VFI:[id number] DOB: 01/30/1961 DOA: 09/20/2017 PCP: Dianna Rossetti, NP   Specialists:   Brief Narrative:  57 year old male COPD, bipolar, tobacco abuse, HTN admitted by critical care medicine 09/20/17 with altered mental status?  Upset stomach liquid stool rigor's-mentation altered on admission-started empirically on community-acquired pneumonia coverage, 2/2 ? LFTs , ammonia- lactulose enemas- ammonia 69 workup also included [-] respiratory panel Placed on Precedex and was weaned off-LFTs and other labs rechecked and was started on home Xanax home Lamictal  Assessment & Plan:   Assessment:  The primary encounter diagnosis was Altered mental status, unspecified altered mental status type. Diagnoses of Hypoxia, Abnormal LFTs, Diabetes mellitus, new onset ([place]), Hyperglycemia, Respiratory failure ([place]), Abdominal distention, Distended abdomen, and Cholelithiases were also pertinent to this visit.  Toxic metabolic encephalopathy-resolving-restarted Lamictal 150 twice daily lithium 300 3 times daily Seroquel 200 at bedtime Xanax 0.5 twice daily Metabolic encephalopathy related to [facility]  Neck pain and discomfort with headache-we will prescribe IcyHot for the patient and can use limited amounts of Tylenol 500 every 4 as needed-he typically does have these at home  [place]pson given elevated ammonia, transaminitis AST ALT 211/294 alk phos 140 on admit-lactulose enema initially in ICU Increasing 20 mg lactulose to 30 mg for 2-3 loose stools daily-LFTs have gradually improved and ultrasound performed 2/17 revealed hepatic steatosis without any other findings  Nonspecific abdominal pain, abdominal x-ray negative on admission-has a known adrenal adenoma which was supposed to be characterized 10/2016 --probably need CT characterization of this in the future  HONK vs dKA on admission-sugar was 1212-Anion gap 20 bicarb was normal unclear if this had a bearing on his  confusion- Lantus 30 daily/SSI moderate coverage-sugars are 124-182--changing to 70/30 insulin 15 u bid Sugars 132-170 and stabilizing  AKI BUN/creatinine peaked 28/2 ? secondary to osmotic diuresis on admit-currently 5/1.05  Hypernatremia-could have something to do with poor vol intake-start lr-d5 50cc/h-sodium is 148 6 more hours of IV fluid and then discontinuing for 3 L of fluid daily  Community-acquired pneumonia completing 2 more doses of ceftriaxone ending on 2/16 a.m.  Hypokalemia-replace with K Dur 40 daily-->bid 2/16 as K 3.0-improved to 3.2, increased to 80 twice daily  Reflux continue pantoprazole 40 at bedtime  DVT prophylaxis: lovenox  Code Status:   IP    Family Communication:   I have updated wife on multiple days Disposition Plan: await improvement of labs, ultrasound abdomen pelvis   Consultants:   None-assumed form CCM  Procedures:   none  Antimicrobials:   none   Subjective:  Awake alert no distress seems to be clear although intermittent confused Asking to remove IV fluid bags IV Walking in halls seems stable Less stool than previous but not eating a whole lot Tells me he is going to be able to drink 3 L if I cut off his fluid Not having any spasm at this time   Objective: Vitals:   09/26/17 0944 09/26/17 2004 09/27/17 0452 09/27/17 0900  BP: (!) 149/87 (!) 170/95 (!) 141/83 (!) 148/88  Pulse: 84 76 71 74  Resp: 20 19 18 20   Temp: 97.7 F (36.5 C) 98.2 F (36.8 C) 98 F (36.7 C) 98.9 F (37.2 C)  TempSrc: Oral Oral Oral Oral  SpO2: 98% 98% 100% 100%  Weight:  115.2 kg (254 lb)    Height:        Intake/Output Summary (Last 24 hours) at 09/27/2017 1114 Last data filed at 09/27/2017 0600 Gross  per 24 hour  Intake 1439.17 ml  Output 300 ml  Net 1139.17 ml   Filed Weights   09/25/17 2107 09/26/17 0445 09/26/17 2004  Weight: 115.2 kg (254 lb) 115.2 kg (253 lb 15.5 oz) 115.2 kg (254 lb)    Examination:  Confusion improving no flap or  tremor Or locking movements intact power 5/5 Did not test reflexes Obese abdomen slight tender but no rebound no guarding no lower extremity edema  Data Reviewed: I have personally reviewed following labs and imaging studies  CBC: Recent Labs  Lab 09/20/17 1449 09/20/17 1456 09/20/17 2112 09/21/17 0312 09/23/17 0632 09/27/17 0542  WBC 12.9*  --  21.5* 18.3* 11.0* 8.3  NEUTROABS 10.4*  --   --   --   --  3.6  HGB 15.7 16.3 16.0 14.2 12.7* 11.7*  HCT 47.0 48.0 45.1 42.4 40.1 36.0*  MCV 99.6  --  93.6 96.8 100.3* 99.7  PLT 296  --  348 231 166 825   Basic Metabolic Panel: Recent Labs  Lab 09/20/17 2112  09/21/17 0312 09/22/17 0321 09/23/17 0632 09/24/17 0619 09/25/17 0530 09/26/17 1041 09/27/17 0542  NA 151*   < > 151* 154* 152* 150* 151* 148* 148*  K 3.6   < > 4.1 3.6 3.4* 3.3* 3.0* 3.2* 3.2*  CL 105   < > 111 117* 116* 114* 116* 112* 112*  CO2 30   < > 29 25 26 23 22 26 26   GLUCOSE 345*   < > 184* 188* 181* 172* 168* 206* 137*  BUN 22*   < > 28* 31* 13 8 8 6  5*  CREATININE 1.40*   < > 2.00* 1.70* 1.12 1.15 1.04 1.13 1.05  CALCIUM 12.4*   < > 10.8* 9.7 9.4 10.1 10.0 9.9 9.5  MG 2.7*  --  2.5* 2.5*  --   --   --   --   --   PHOS 1.1*  --  3.3 2.8  --   --   --   --   --    < > = values in this interval not displayed.   GFR: Estimated Creatinine Clearance: 104.4 mL/min (by C-G formula based on SCr of 1.05 mg/dL). Liver Function Tests: Recent Labs  Lab 09/20/17 1449 09/24/17 0619 09/25/17 0530 09/26/17 1041 09/27/17 0542  AST 211* 107* 106* 97* 84*  ALT 294* 132* 129* 116* 106*  ALKPHOS 140* 125 141* 129* 114  BILITOT 1.4* 1.4* 1.4* 1.0 1.1  PROT 8.3* 6.5 6.5 6.2* 6.5  ALBUMIN 4.8 3.6 3.4* 3.4* 3.3*   Recent Labs  Lab 09/20/17 1800  LIPASE 39  AMYLASE 26*   Recent Labs  Lab 09/20/17 2205 09/21/17 0312 09/24/17 0619 09/25/17 0925 09/27/17 0542  AMMONIA 63* 69* 41* 42* 37*   Coagulation Profile: Recent Labs  Lab 09/20/17 1449  INR 1.03    Cardiac Enzymes: No results for input(s): CKTOTAL, CKMB, CKMBINDEX, TROPONINI in the last 168 hours. CBG: Recent Labs  Lab 09/26/17 0907 09/26/17 1218 09/26/17 1645 09/26/17 2001 09/27/17 0806  GLUCAP 162* 172* 167* 153* 132*   Urine analysis:    Component Value Date/Time   COLORURINE STRAW (A) 09/20/2017 1610   APPEARANCEUR CLEAR 09/20/2017 1610   LABSPEC 1.024 09/20/2017 1610   PHURINE 7.0 09/20/2017 1610   GLUCOSEU >=500 (A) 09/20/2017 1610   HGBUR SMALL (A) 09/20/2017 1610   BILIRUBINUR NEGATIVE 09/20/2017 1610   BILIRUBINUR NEG 06/24/2011 [place] 09/20/2017 1610  PROTEINUR NEGATIVE 09/20/2017 1610   UROBILINOGEN 0.2 06/24/2011 1520   NITRITE NEGATIVE 09/20/2017 1610   LEUKOCYTESUR NEGATIVE 09/20/2017 1610     Radiology Studies: Reviewed images personally in health database    Scheduled Meds: . ALPRAZolam  0.5 mg Oral BID  . diclofenac sodium  2 g Topical QID  . famotidine  40 mg Oral QHS  . heparin  5,000 Units Subcutaneous Q8H  . insulin aspart protamine- aspart  15 Units Subcutaneous BID WC  . lactulose  20 g Oral BID  . lamoTRIgine  150 mg Oral BID  . lithium carbonate  300 mg Oral TID  . pantoprazole sodium  40 mg Oral QHS  . potassium chloride  40 mEq Oral BID  . QUEtiapine  200 mg Oral QHS  . thiamine  100 mg Oral Daily   Continuous Infusions: . dextrose 5% lactated ringers 50 mL/hr at 09/25/17 1032     LOS: 7 days    Time spent: [place], MD [facility] ([facility]   If 7PM-7AM, please contact night-coverage [email or web address] Password [email or web address] 09/27/2017, 11:14 AM

## 2017-09-27 NOTE — Progress Notes (Signed)
  Speech Language Pathology Treatment: Dysphagia  Patient Details Name: Jesus Martin MRN: [id number] DOB: January 29, 1961 Today's Date: 09/27/2017 Time: 0312-8118 SLP Time Calculation (min) (ACUTE ONLY): 11 min  Assessment / Plan / Recommendation Clinical Impression  Pt continues to present with improved attention and mentation. Pt was observed with regular solids and thin liquids without overt signs concerning for aspiration. Pt reports no difficulty/coughing with meals since diet upgrade. Pt benefited form Min verbal cues to reinforce aspiration precautions. Recommend continued regular solid and thin liquid diet with aspiration precautions. Given observed swallow function and improved mentation; SLP services no longer indicated. Please re-consult in the event of acute change.      HPI HPI: Pt is a 56 year old man with history of bipolar disease, alcohol abuse, diabetes (poorly controlled), GERD, and COPD, who presented with AMS in the setting of rigors, severe hyperglycemia, metabolic disarray, and abdominal discomfort. There is also concern for alcohol withdrawal.      SLP Plan  Discharge SLP treatment due to (comment)(goals met)       Recommendations  Diet recommendations: Regular;Thin liquid Liquids provided via: Straw Medication Administration: Whole meds with liquid(as tolerated) Supervision: Intermittent supervision to cue for compensatory strategies;Patient able to self feed Compensations: Minimize environmental distractions;Slow rate;Small sips/bites Postural Changes and/or Swallow Maneuvers: Seated upright 90 degrees;Upright 30-60 min after meal                Oral Care Recommendations: Oral care BID Follow up Recommendations: Other (comment)(tba) SLP Visit Diagnosis: Dysphagia, oropharyngeal phase (R13.12) Plan: Discharge SLP treatment due to (comment)(goals met)       GO              Jesus Martin SLP Student Clinician   Jesus Martin 09/27/2017, 3:40  PM

## 2017-09-27 NOTE — Consult Note (Signed)
   [facility] CM Inpatient Consult   09/27/2017  WADELL CRADDOCK 1961/04/16 [id number]  Patient evaluated for community based chronic disease management services with [facility] as a benefit of patient's [facility]. Spoke with patient and wife Larene Beach at bedside to explain [place] services. Patient states that he is having some confusion and may defer his conversation and questions to his wife.  Dr. Verlon Au in to see the patient as well and explaining the close follow up needed.  The patient and wife endorse the patient's primary care provider is Sanjuana Mae of [facility].  This office general is listed to provide the transition of care needs.  Patient states that he will need to follow up on his health issues.   Patient will receive post hospital discharge call and will be evaluated for monthly home visits for assessments and disease process education.  Patient and wife desire follow up on diet, exercise, and assistance with medication management. Consent form signed and a [facility] folder with contact information was given.  Made Inpatient Case Manager aware that [place] Management following. Of note, [facility] Management services does not replace or interfere with any services that are arranged by inpatient case management or social work.  For additional questions or referrals please contact:    Natividad Brood, RN BSN [facility] Liaison  [phone] business mobile phone Toll free office [phone]

## 2017-09-28 DIAGNOSIS — Z23 Encounter for immunization: Secondary | ICD-10-CM | POA: Diagnosis not present

## 2017-09-28 LAB — COMPREHENSIVE METABOLIC PANEL
ALBUMIN: 3.2 g/dL — AB (ref 3.5–5.0)
ALK PHOS: 108 U/L (ref 38–126)
ALT: 90 U/L — AB (ref 17–63)
AST: 71 U/L — AB (ref 15–41)
Anion gap: 10 (ref 5–15)
BUN: <5 mg/dL — ABNORMAL LOW (ref 6–20)
CHLORIDE: 114 mmol/L — AB (ref 101–111)
CO2: 23 mmol/L (ref 22–32)
CREATININE: 1.04 mg/dL (ref 0.61–1.24)
Calcium: 9.3 mg/dL (ref 8.9–10.3)
Glucose, Bld: 143 mg/dL — ABNORMAL HIGH (ref 65–99)
POTASSIUM: 3.7 mmol/L (ref 3.5–5.1)
SODIUM: 147 mmol/L — AB (ref 135–145)
TOTAL PROTEIN: 5.9 g/dL — AB (ref 6.5–8.1)
Total Bilirubin: 0.9 mg/dL (ref 0.3–1.2)

## 2017-09-28 LAB — GLUCOSE, CAPILLARY
GLUCOSE-CAPILLARY: 125 mg/dL — AB (ref 65–99)
GLUCOSE-CAPILLARY: 142 mg/dL — AB (ref 65–99)
Glucose-Capillary: 153 mg/dL — ABNORMAL HIGH (ref 65–99)

## 2017-09-28 MED ORDER — QUETIAPINE FUMARATE 200 MG PO TABS: 200.0000 mg | ORAL_TABLET | Freq: Every day | ORAL | 0 refills | Status: DC

## 2017-09-28 MED ORDER — LACTULOSE 10 GM/15ML PO SOLN: 30.0000 g | Freq: Two times a day (BID) | ORAL | 0 refills | Status: DC

## 2017-09-28 MED ORDER — POTASSIUM CHLORIDE CRYS ER 20 MEQ PO TBCR: 80.0000 meq | EXTENDED_RELEASE_TABLET | Freq: Two times a day (BID) | ORAL | 0 refills | Status: DC

## 2017-09-28 MED ORDER — INSULIN STARTER KIT- SYRINGES (ENGLISH): 1.0000 | Freq: Once | 0 refills | Status: DC

## 2017-09-28 MED ORDER — INSULIN ASPART PROT & ASPART (70-30 MIX) 100 UNIT/ML ~~LOC~~ SUSP: 15.0000 [IU] | Freq: Two times a day (BID) | SUBCUTANEOUS | 11 refills | Status: DC

## 2017-09-28 MED ORDER — BLOOD GLUCOSE MONITOR KIT: PACK | 0 refills | Status: DC

## 2017-09-28 MED ORDER — DICLOFENAC SODIUM 1 % TD GEL: 2.0000 g | Freq: Four times a day (QID) | TRANSDERMAL | 0 refills | Status: DC

## 2017-09-28 MED ORDER — INSULIN STARTER KIT- PEN NEEDLES (ENGLISH): 1.0000 | Freq: Once | 0 refills | Status: DC

## 2017-09-28 MED ORDER — INSULIN LISPRO PROT & LISPRO (75-25 MIX) 100 UNIT/ML KWIKPEN: 15.0000 [IU] | PEN_INJECTOR | Freq: Two times a day (BID) | SUBCUTANEOUS | 11 refills | Status: DC

## 2017-09-28 NOTE — Progress Notes (Signed)
   09/28/17 1100  Clinical Encounter Type  Visited With Patient and family together  Visit Type Initial  Referral From Chaplain  Consult/Referral To Chaplain  Spiritual Encounters  Spiritual Needs Emotional  Stress Factors  Patient Stress Factors Exhausted  Family Stress Factors Exhausted    Pt was sitting in a chair watching TV. No family on-site at the time I entered but wife had left shortly and joined [place]. Patient talked about the wife's intervention to help bring him to the ED and her support. Wife talked about her appreciation for the fast and quick intervention by the medical staff to help Pt. I provided emotional support through reflective listening and compassionate presence.  Adrena Nakamura a Medical sales representative, [employer]

## 2017-09-28 NOTE — Progress Notes (Signed)
BENEFIT CHECK:  Per CMA: Memory [facility]        # 1.  S/W DOUG @ [place] # [phone]   INSULIN:  1. NOVOLIN 70 / 30 PEN  COVER- YES  CO-PAY- BRAND- $ 8.50  AND GENERIC - $ 3.40  PRIOR APPROVAL- NO   2. NOVOLIN 70 / 30 VAIL  COVER- YES  CO-PAY- BRAND - $ 8.40 AND GENERIC- $ 3.40  PRIOR APPROVAL- NO

## 2017-09-28 NOTE — Care Management Note (Addendum)
Case Management Note  Patient Details  Name: Jesus Martin MRN: [id number] Date of Birth: 1960/09/27  Subjective/Objective:  Admitted for  non-ketotic hyperglycinemia, Altered mental status.  Action/Plan: Patient with discharge orders home today.  Benefit check initiated for Insulin (Novolin 70/30).  In to speak with patient, wife at bedside. Permission given to speak in front of wife.  PCP noted. Uses [facility] on [place], [place].  Wife takes patient to medical appointments. Wife/patient both cook.  Wife also does the shopping.  Patient denies inability to afford medications or food.  Discussed recommendation of DME: 3 in 1.  Patient offered choice and selected [facility].  Referral called to Butch Penny with [facility].  Expected Discharge Date:  09/28/17               Expected Discharge Plan:  Home/Self Care  Discharge planning Services  CM Consult  Post Acute Care Choice:  Durable Medical Equipment  Choice offered to:   Patient/Spouse  DME Arranged:  3-N-1 DME Agency:  [place].  Status of Service:  Completed, signed off  Kristen Cardinal, RN  Nurse Case Manager [place] 09/28/2017, 10:47 AM

## 2017-09-28 NOTE — Progress Notes (Signed)
Pt being discharged home via wheelchair with family. Pt alert and oriented x4. VSS. Pt c/o no pain at this time. No signs of respiratory distress. Education complete and care plans resolved. IV removed with catheter intact and pt tolerated well. No further issues at this time. Pt to follow up with PCP. Julissa Browning R, RN 

## 2017-09-28 NOTE — Discharge Summary (Addendum)
Physician Discharge Summary  Jesus Martin JTT:[id number] DOB: 1960-09-03 DOA: 09/20/2017  PCP: Dianna Rossetti, NP  Admit date: 09/20/2017 Discharge date: 09/28/2017  Time spent: 45 minutes  Recommendations for Outpatient Follow-up:  1.  new medications including lactulose twice daily, Mag-Ox high-dose, 7030 insulin twice daily glucometer, Voltaren gel for neck 2. Needs outpatient diabetic education going forward and consolidation of knowledge found 3. Will need outpatient eventual gastroenterology input for nonalcoholic hepatitis steatosis 4. Needs basic metabolic panel [redacted] week along with magnesium 5. Suggest outpatient follow-up with psychiatry 6. Suggest follow-up with outpatient CT scan of adrenal adenoma in about 2-3 months once stabilized  Discharge Diagnoses:  Active Problems:   Non-ketotic hyperglycinemia (HCC)   Altered mental status   Respiratory failure ([place])   Diabetes mellitus, new onset [facility])   Discharge Condition: Improved  Diet recommendation: Diabetic heart healthy  Filed Weights   09/26/17 0445 09/26/17 2004 09/27/17 2032  Weight: 115.2 kg (253 lb 15.5 oz) 115.2 kg (254 lb) 115.3 kg (254 lb 3.1 oz)    History of present illness:  57 year old male COPD, bipolar, tobacco abuse, HTN admitted by critical care medicine 09/20/17 with altered mental status?  Upset stomach liquid stool rigor's-mentation altered on admission-started empirically on community-acquired pneumonia coverage, 2/2 ? LFTs , ammonia- lactulose enemas- ammonia 69 workup also included [-] respiratory panel Placed on Precedex and was weaned off-LFTs and other labs rechecked and was started on home Xanax home Lamictal  Hospital Course:  Toxic metabolic encephalopathy-resolving-restarted Lamictal 150 twice daily lithium 300 3 times daily Seroquel 200 at bedtime Xanax 0.5 twice daily Metabolic encephalopathy related to [facility]  Neck pain and discomfort with headache-we will prescribe IcyHot for the  patient and can use limited amounts of Tylenol 500 every 4 as needed-he typically does have these at home  [place] given elevated ammonia, transaminitis AST ALT 211/294 alk phos 140 on admit-lactulose enema initially in ICU Increasing 20 mg lactulose to 30 mg for 2-3 loose stools daily-LFTs have gradually improved and ultrasound performed 2/17 revealed hepatic steatosis without any other findings  Nonspecific abdominal pain, abdominal x-ray negative on admission-has a known adrenal adenoma which was supposed to be characterized 10/2016 --probably need CT characterization of this in the future  HONK vs dKA on admission-sugar was 1212-Anion gap 20 bicarb was normal unclear if this had a bearing on his confusion- Lantus 30 daily/SSI moderate coverage-sugars are 124-182--changing to 70/30 insulin 15 u bid Sugars 132-170 and stabilizing Needs outpatient education with diabetic coordinator-given scripts for insulin, syringes, needles, glucometer and supplies  AKI BUN/creatinine peaked 28/2 ? secondary to osmotic diuresis on admit-currently 5/1.05  Hypernatremia-could have something to do with poor vol intake-start lr-d5 50cc/h-sodium is 147 on discharge and probably related to fluid repletion in hospital He has been encouraged to drink 3 L of fluid a day  Community-acquired pneumonia completing 2 more doses of ceftriaxone ending on 2/16 a.m.  Hypokalemia-replace with K Dur 40 daily-->bid 2/16 as K 3.0-improved to 3.2, increased to 80 twice daily and will need outpatient labs  Reflux continue pantoprazole 40 at bedtime  Hypertension-this admission have discontinue Zestoretic and will need outpatient monitoring of pressures  Procedures:  None   Consultations:  Assumed care from critical care medicine  Discharge Exam: Vitals:   09/27/17 2032 09/28/17 0454  BP: (!) 152/89 127/75  Pulse: 76 83  Resp: 18 20  Temp: 98.9 F (37.2 C) (!) 97.5 F (36.4 C)  SpO2: 98% 95%    General:  Awake  alert pleasant no distress tolerating diet smiling less confused no asterixis neck pain is improved Cardiovascular: S1-S2 no murmur rub or gallop Respiratory: Clinically clear no added sound Abdomen soft no rebound no guarding Neck is soft supple without any discomfort  Discharge Instructions    Allergies as of 09/28/2017      Reactions   Yellow Jacket Venom [bee Venom] Anaphylaxis, Other (See Comments)   And the patient passes out      Medication List    STOP taking these medications   lisinopril-hydrochlorothiazide 20-12.5 MG tablet Commonly known as:  PRINZIDE,ZESTORETIC     TAKE these medications   acetaminophen 325 MG tablet Commonly known as:  TYLENOL Take 325-650 mg by mouth every 6 (six) hours as needed (for pain or headaches).   ALPRAZolam 1 MG tablet Commonly known as:  XANAX Take 2 mg by mouth every morning.   amLODipine 5 MG tablet Commonly known as:  NORVASC Take 5 mg by mouth daily.   blood glucose meter kit and supplies Kit Dispense based on patient and insurance preference. Use up to four times daily as directed. (FOR ICD-9 250.00, 250.01).   diclofenac sodium 1 % Gel Commonly known as:  VOLTAREN Apply 2 g topically 4 (four) times daily.   diphenhydrAMINE 25 mg capsule Commonly known as:  BENADRYL Take 25 mg by mouth every 8 (eight) hours as needed for allergies.   EPIPEN 2-PAK 0.3 mg/0.3 mL Soaj injection Generic drug:  EPINEPHrine Inject 0.3 mg into the muscle once as needed (for anaphylactic reaction to bee stings).   Insulin Lispro Prot & Lispro (75-25) 100 UNIT/ML Kwikpen Commonly known as:  HUMALOG 75/25 MIX Inject 15 Units into the skin 2 (two) times daily.   lactulose 10 GM/15ML solution Commonly known as:  CHRONULAC Take 45 mLs (30 g total) by mouth 2 (two) times daily.   lamoTRIgine 150 MG tablet Commonly known as:  LAMICTAL Take 150 mg by mouth 2 (two) times daily.   lithium carbonate 450 MG CR tablet Commonly known as:   ESKALITH Take 450-675 mg by mouth See admin instructions. 675 mg by mouth in the morning and 450 mg at bedtime   multivitamin Tabs tablet Take 1 tablet by mouth daily.   omeprazole 40 MG capsule Commonly known as:  PRILOSEC Take 40 mg by mouth daily.   ondansetron 4 MG tablet Commonly known as:  ZOFRAN Take 4 mg by mouth every 8 (eight) hours as needed for nausea or vomiting.   potassium chloride SA 20 MEQ tablet Commonly known as:  K-DUR,KLOR-CON Take 4 tablets (80 mEq total) by mouth 2 (two) times daily.   QUEtiapine 50 MG tablet Commonly known as:  SEROQUEL Take 200 mg by mouth at bedtime. What changed:  Another medication with the same name was changed. Make sure you understand how and when to take each.   QUEtiapine 200 MG tablet Commonly known as:  SEROQUEL Take 1 tablet (200 mg total) by mouth at bedtime. What changed:  medication strength   traMADol 50 MG tablet Commonly known as:  ULTRAM Take 50 mg by mouth 2 (two) times daily as needed (for pain).   TRELEGY ELLIPTA 100-62.5-25 MCG/INH Aepb Generic drug:  Fluticasone-Umeclidin-Vilant Inhale 1 puff into the lungs daily.   VENTOLIN HFA 108 (90 Base) MCG/ACT inhaler Generic drug:  albuterol Inhale 2 puffs into the lungs every 6 (six) hours as needed for wheezing or shortness of breath.   Vitamin D-3 1000 units Caps Take 1,000 Units  by mouth daily.            Durable Medical Equipment  (From admission, onward)        Start     Ordered   09/28/17 1115  For home use only DME 3 n 1  Once     09/28/17 1114     Allergies  Allergen Reactions  . Yellow Jacket Venom [Bee Venom] Anaphylaxis and Other (See Comments)    And the patient passes out      The results of significant diagnostics from this hospitalization (including imaging, microbiology, ancillary and laboratory) are listed below for reference.    Significant Diagnostic Studies: US Abdomen Complete  Result Date: 09/26/2017 CLINICAL DATA:   Cholelithiasis. EXAM: ABDOMEN ULTRASOUND COMPLETE COMPARISON:  Right upper quadrant ultrasound dated September 20, 2017. FINDINGS: Gallbladder: No gallstones or wall thickening visualized. No sonographic Murphy sign noted by sonographer. Common bile duct: Diameter: 5 mm, normal. Liver: No focal lesion identified. Diffusely increased in parenchymal echogenicity. Portal vein is patent on color Doppler imaging with normal direction of blood flow towards the liver. IVC: Not visualized. Pancreas: Not visualized. Spleen: Size and appearance within normal limits. Right Kidney: Length: 13.8 cm. Echogenicity within normal limits. No mass or hydronephrosis visualized. Left Kidney: Length: 14.1 cm. Echogenicity within normal limits. No mass or hydronephrosis visualized. Abdominal aorta: Not visualized. Other findings: None. IMPRESSION: 1. Diffuse hepatic steatosis. Otherwise normal abdominal ultrasound. Electronically Signed   By: Titus Dubin M.D.   On: 09/26/2017 10:24   Dg Chest Port 1 View  Result Date: 09/22/2017 CLINICAL DATA:  Respiratory failure. EXAM: PORTABLE CHEST 1 VIEW COMPARISON:  09/20/2017 FINDINGS: The cardiomediastinal silhouette is unchanged and within normal limits. Lung volumes remain low. There is minimal right midlung opacity. The left lung is clear. No sizable pleural effusion or pneumothorax is identified. IMPRESSION: Low lung volumes with minimal right midlung atelectasis. Electronically Signed   By: Logan Bores M.D.   On: 09/22/2017 06:38   Dg Chest Port 1 View  Result Date: 09/20/2017 CLINICAL DATA:  57 year old male with agitation, confusion, vomiting, hyperglycemia. EXAM: PORTABLE CHEST 1 VIEW COMPARISON:  Chest radiographs 07/29/2017. FINDINGS: Portable AP semi upright view at 1809 hr. Lordotic positioning. Lung volumes appear stable. Mediastinal contours remain within normal limits. Visualized tracheal air column is within normal limits. Allowing for portable technique the lungs are  clear. No acute osseous abnormality identified. IMPRESSION: No acute cardiopulmonary abnormality. Electronically Signed   By: Genevie Ann M.D.   On: 09/20/2017 18:53   Dg Abd Portable 1v  Result Date: 09/22/2017 CLINICAL DATA:  Abdominal distention. EXAM: PORTABLE ABDOMEN - 1 VIEW COMPARISON:  CT abdomen dated November 07, 2015. FINDINGS: Borderline distended, air-filled ascending and transverse colon. No dilated small bowel loops. IMPRESSION: Mild gaseous distention of the ascending and transverse colon. Electronically Signed   By: Titus Dubin M.D.   On: 09/22/2017 14:38   Ct Head Code Stroke Wo Contrast  Result Date: 09/20/2017 CLINICAL DATA:  Code stroke. Confusion and generalized weakness. Elevated glucose. EXAM: CT HEAD WITHOUT CONTRAST TECHNIQUE: Contiguous axial images were obtained from the base of the skull through the vertex without intravenous contrast. COMPARISON:  None. FINDINGS: Significant motion degradation. Study is of reduced diagnostic utility. Brain: No evidence of acute infarction, hemorrhage, hydrocephalus, extra-axial collection or mass lesion/mass effect. Premature for age atrophy. Hypoattenuation of white matter, likely small vessel disease. Chronic LEFT frontal infarct. Vascular: No definite hyperattenuating vessels, although sensitivity is diminished. Skull: No definite fracture.  Sinuses/Orbits: No significant sinus opacity or orbital findings. Other: None. ASPECTS [facility] Stroke Program Early CT Score) - Ganglionic level infarction (caudate, lentiform nuclei, internal capsule, insula, M1-M3 cortex): Estimated 7 - Supraganglionic infarction (M4-M6 cortex): Estimated 3 Total score (0-10 with 10 being normal): Estimated 10. IMPRESSION: 1. No definite emergent large vessel occlusion or acute cortical infarction. Premature for age atrophy with chronic microvascular ischemic change. Old LEFT frontal infarct. 2. ASPECTS is estimated 10. These results were communicated to Lindzen MD at 3:37  pmon 2/11/2019by text page via the [facility] messaging system. Electronically Signed   By: Staci Righter M.D.   On: 09/20/2017 15:40   US Abdomen Limited Ruq  Result Date: 09/20/2017 CLINICAL DATA:  Elevated liver function tests. EXAM: ULTRASOUND ABDOMEN LIMITED RIGHT UPPER QUADRANT COMPARISON:  None. FINDINGS: Gallbladder: No gallstones or wall thickening visualized. No sonographic Murphy sign noted by sonographer. Common bile duct: Diameter: 6 mm, within normal limits. Liver: Diffusely increased echogenicity of the hepatic parenchyma, consistent with hepatic steatosis. No focal mass lesion identified. IMPRESSION: No evidence of cholelithiasis or biliary ductal dilatation. Diffuse hepatic steatosis. Electronically Signed   By: Earle Gell M.D.   On: 09/20/2017 20:14    Microbiology: Recent Results (from the past 240 hour(s))  Blood culture (routine x 2)     Status: Abnormal   Collection Time: 09/20/17  4:02 PM  Result Value Ref Range Status   Specimen Description BLOOD RIGHT ANTECUBITAL  Final   Special Requests   Final    BOTTLES DRAWN AEROBIC AND ANAEROBIC Blood Culture adequate volume   Culture  Setup Time   Final    GRAM POSITIVE COCCI IN CLUSTERS ANAEROBIC BOTTLE ONLY CRITICAL RESULT CALLED TO, READ BACK BY AND VERIFIED WITH: Alvino Chapel.D. 12:45 09/21/17 ([email or web address])    Culture (A)  Final    STAPHYLOCOCCUS SPECIES (COAGULASE NEGATIVE) THE SIGNIFICANCE OF ISOLATING THIS ORGANISM FROM A SINGLE SET OF BLOOD CULTURES WHEN MULTIPLE SETS ARE DRAWN IS UNCERTAIN. PLEASE NOTIFY THE MICROBIOLOGY DEPARTMENT WITHIN ONE WEEK IF SPECIATION AND SENSITIVITIES ARE REQUIRED. Performed at [facility], [place] [address] [place], [place] [address]    Report Status 09/23/2017 FINAL  Final  Blood Culture ID Panel (Reflexed)     Status: Abnormal   Collection Time: 09/20/17  4:02 PM  Result Value Ref Range Status   Enterococcus species NOT DETECTED NOT DETECTED Final   Vancomycin resistance NOT DETECTED  NOT DETECTED Final   Listeria monocytogenes NOT DETECTED NOT DETECTED Final   Staphylococcus species DETECTED (A) NOT DETECTED Final    Comment: CRITICAL RESULT CALLED TO, READ BACK BY AND VERIFIED WITH: Alvino Chapel.D. 12:45 09/21/17 ([email or web address]) Methicillin (oxacillin) susceptible coagulase negative staphylococcus. Possible blood culture contaminant (unless isolated from more than one blood culture draw or clinical case suggests pathogenicity). No antibiotic treatment is indicated for blood  culture contaminants.    Staphylococcus aureus NOT DETECTED NOT DETECTED Final   Methicillin resistance NOT DETECTED NOT DETECTED Final   Streptococcus species NOT DETECTED NOT DETECTED Final   Streptococcus agalactiae NOT DETECTED NOT DETECTED Final   Streptococcus pneumoniae NOT DETECTED NOT DETECTED Final   Streptococcus pyogenes NOT DETECTED NOT DETECTED Final   Acinetobacter baumannii NOT DETECTED NOT DETECTED Final   Enterobacteriaceae species NOT DETECTED NOT DETECTED Final   Enterobacter cloacae complex NOT DETECTED NOT DETECTED Final   Escherichia coli NOT DETECTED NOT DETECTED Final   Klebsiella oxytoca NOT DETECTED NOT DETECTED Final   Klebsiella pneumoniae NOT DETECTED NOT  DETECTED Final   Proteus species NOT DETECTED NOT DETECTED Final   Serratia marcescens NOT DETECTED NOT DETECTED Final   Carbapenem resistance NOT DETECTED NOT DETECTED Final   Haemophilus influenzae NOT DETECTED NOT DETECTED Final   Neisseria meningitidis NOT DETECTED NOT DETECTED Final   Pseudomonas aeruginosa NOT DETECTED NOT DETECTED Final   Candida albicans NOT DETECTED NOT DETECTED Final   Candida glabrata NOT DETECTED NOT DETECTED Final   Candida krusei NOT DETECTED NOT DETECTED Final   Candida parapsilosis NOT DETECTED NOT DETECTED Final   Candida tropicalis NOT DETECTED NOT DETECTED Final    Comment: Performed at [facility], [place] [address] [place], [place] [address]  Blood culture (routine x 2)      Status: None   Collection Time: 09/20/17  4:25 PM  Result Value Ref Range Status   Specimen Description BLOOD LEFT HAND  Final   Special Requests   Final    BOTTLES DRAWN AEROBIC AND ANAEROBIC Blood Culture results may not be optimal due to an inadequate volume of blood received in culture bottles   Culture   Final    NO GROWTH 5 DAYS Performed at [facility], [place] [address] [place], [place] [address]    Report Status 09/25/2017 FINAL  Final  Respiratory Panel by PCR     Status: None   Collection Time: 09/20/17  9:55 PM  Result Value Ref Range Status   Adenovirus NOT DETECTED NOT DETECTED Final   Coronavirus 229E NOT DETECTED NOT DETECTED Final   Coronavirus HKU1 NOT DETECTED NOT DETECTED Final   Coronavirus NL63 NOT DETECTED NOT DETECTED Final   Coronavirus OC43 NOT DETECTED NOT DETECTED Final   Metapneumovirus NOT DETECTED NOT DETECTED Final   Rhinovirus / Enterovirus NOT DETECTED NOT DETECTED Final   Influenza A NOT DETECTED NOT DETECTED Final   Influenza A H1 NOT DETECTED NOT DETECTED Final   Influenza A H1 2009 NOT DETECTED NOT DETECTED Final   Influenza A H3 NOT DETECTED NOT DETECTED Final   Influenza B NOT DETECTED NOT DETECTED Final   Parainfluenza Virus 1 NOT DETECTED NOT DETECTED Final   Parainfluenza Virus 2 NOT DETECTED NOT DETECTED Final   Parainfluenza Virus 3 NOT DETECTED NOT DETECTED Final   Parainfluenza Virus 4 NOT DETECTED NOT DETECTED Final   Respiratory Syncytial Virus NOT DETECTED NOT DETECTED Final   Bordetella pertussis NOT DETECTED NOT DETECTED Final   Chlamydophila pneumoniae NOT DETECTED NOT DETECTED Final   Mycoplasma pneumoniae NOT DETECTED NOT DETECTED Final    Comment: Performed at [facility], [place] [address], [place], [place] [address]  MRSA PCR Screening     Status: None   Collection Time: 09/20/17  9:56 PM  Result Value Ref Range Status   MRSA by PCR NEGATIVE NEGATIVE Final    Comment:        The GeneXpert MRSA Assay  (FDA approved for NASAL specimens only), is one component of a comprehensive MRSA colonization surveillance program. It is not intended to diagnose MRSA infection nor to guide or monitor treatment for MRSA infections. Performed at [facility], [place] [address] [place], [place] [address]      Labs: Basic Metabolic Panel: Recent Labs  Lab 09/22/17 0321  09/24/17 2952 09/25/17 0530 09/26/17 1041 09/27/17 0542 09/28/17 0442  NA 154*   < > 150* 151* 148* 148* 147*  K 3.6   < > 3.3* 3.0* 3.2* 3.2* 3.7  CL 117*   < >  114* 116* 112* 112* 114*  CO2 25   < > _0 GLUCOSE 188*   < > 172* 168* 206* 137* 143*  BUN 31*   < > _1 5* <5*  CREATININE 1.70*   < > 1.15 1.04 1.13 1.05 1.04  CALCIUM 9.7   < > 10.1 10.0 9.9 9.5 9.3  MG 2.5*  --   --   --   --   --   --   PHOS 2.8  --   --   --   --   --   --    < > = values in this interval not displayed.   Liver Function Tests: Recent Labs  Lab 09/24/17 0619 09/25/17 0530 09/26/17 1041 09/27/17 0542 09/28/17 0442  AST 107* 106* 97* 84* 71*  ALT 132* 129* 116* 106* 90*  ALKPHOS 125 141* 129* 114 108  BILITOT 1.4* 1.4* 1.0 1.1 0.9  PROT 6.5 6.5 6.2* 6.5 5.9*  ALBUMIN 3.6 3.4* 3.4* 3.3* 3.2*   No results for input(s): LIPASE, AMYLASE in the last 168 hours. Recent Labs  Lab 09/24/17 0619 09/25/17 0925 09/27/17 0542  AMMONIA 41* 42* 37*   CBC: Recent Labs  Lab 09/23/17 0632 09/27/17 0542  WBC 11.0* 8.3  NEUTROABS  --  3.6  HGB 12.7* 11.7*  HCT 40.1 36.0*  MCV 100.3* 99.7  PLT 166 184   Cardiac Enzymes: No results for input(s): CKTOTAL, CKMB, CKMBINDEX, TROPONINI in the last 168 hours. BNP: BNP (last 3 results) No results for input(s): BNP in the last 8760 hours.  ProBNP (last 3 results) No results for input(s): PROBNP in the last 8760 hours.  CBG: Recent Labs  Lab 09/27/17 1643 09/27/17 2023 09/27/17 2343 09/28/17 0451 09/28/17 0726  GLUCAP 176* 164* 139* 125* 142*        Signed:  Nita Sells MD   [facility] 09/28/2017, 9:26 AM

## 2017-09-30 ENCOUNTER — Other Ambulatory Visit: Payer: Self-pay | Admitting: Pharmacist

## 2017-09-30 ENCOUNTER — Other Ambulatory Visit: Payer: Self-pay

## 2017-09-30 NOTE — Patient Outreach (Signed)
[name]) Care Management  09/30/2017  Jesus Martin Jun 15, 1961 [id number]    Telephone Screen  Referral Date:09/27/17 Referral Source: [facility] hospital liaison Referral Reason: diabetes, medication management, A1C 11.2" Insurance: [employer] attempt # 1  to patient. No answer at present and unable to leave message.     Plan: RN CM will make outreach attempt to patient within one business day if no return call from patient.   Enzo Montgomery, RN,BSN,CCM [place] [facility] Telephonic Care Management Coordinator Direct Phone: [phone] Toll Free: [phone] Fax: [phone]

## 2017-09-30 NOTE — Patient Outreach (Signed)
[place] [facility]) Care Management  09/30/2017  Jesus Martin February 16, 1961 [id number]  57 year old male referred to [place] [facility]  for transition of care services for diabetes management.  [place] services requested for medication reconciliation.  PMHx includes, but not limited to, bipolar disorder, alcohol abuse, tobacco abuse, memory decline, falls, GERD, HTN, COPD, and recent hospitalization for AMS with rigors, severe hyperglycemia, and metabolic disarray, found to have new diagnosis of diabetes mellitus.    Per review of [facility] notes and co-pays, patient has Extra Help / LIS.   Unsuccessful telephone call to Jesus Martin today.  I left a HIPPA compliant voicemail on the home phone.    Plan: I will follow-up with patient tomorrorw regarding medication reconciliation.   Ralene Bathe, PharmD, [place] [phone]

## 2017-10-01 ENCOUNTER — Other Ambulatory Visit: Payer: Self-pay

## 2017-10-01 ENCOUNTER — Other Ambulatory Visit: Payer: Self-pay | Admitting: Pharmacist

## 2017-10-01 NOTE — Patient Outreach (Signed)
Jesus Martin [facility]) Care Management  10/01/2017  Jesus Martin 03/06/1961 [id number]   Telephone Screen  Referral Date:09/27/17 Referral Source: [facility] hospital liaison Referral Reason: diabetes, medication management, A1C 11.2" Insurance: [employer] attempt #2 to patient. No answer at present. RN CM left HIPAA compliant voicemail message along with contact info.      Plan: RN CM will send unsuccessful outreach letter to patient, wait 10 business days then  make final call attempt and close case if no response from patient.     Enzo Montgomery, RN,BSN,CCM [place] [facility] Telephonic Care Management Coordinator Direct Phone: [phone] Toll Free: [phone] Fax: [phone]

## 2017-10-01 NOTE — Patient Outreach (Signed)
[name]) Care Management  10/01/2017  Jesus Martin 04-13-1961 [id number]  Unsuccessful telephone call #2 to [name] today.  I left a HIPPA compliant voicemail on the home phone.    Plan: I will mail patient a letter describing [facility] services and follow-up with him in 10 days regarding medication management.   Ralene Bathe, PharmD, [place] [phone]

## 2017-10-08 DIAGNOSIS — Z794 Long term (current) use of insulin: Secondary | ICD-10-CM | POA: Diagnosis not present

## 2017-10-08 DIAGNOSIS — Z1389 Encounter for screening for other disorder: Secondary | ICD-10-CM | POA: Diagnosis not present

## 2017-10-08 DIAGNOSIS — R7989 Other specified abnormal findings of blood chemistry: Secondary | ICD-10-CM | POA: Diagnosis not present

## 2017-10-08 DIAGNOSIS — E1165 Type 2 diabetes mellitus with hyperglycemia: Secondary | ICD-10-CM | POA: Diagnosis not present

## 2017-10-08 DIAGNOSIS — E876 Hypokalemia: Secondary | ICD-10-CM | POA: Diagnosis not present

## 2017-10-08 DIAGNOSIS — K760 Fatty (change of) liver, not elsewhere classified: Secondary | ICD-10-CM | POA: Diagnosis not present

## 2017-10-14 ENCOUNTER — Ambulatory Visit: Payer: Self-pay | Admitting: Pharmacist

## 2017-10-14 ENCOUNTER — Other Ambulatory Visit: Payer: Self-pay

## 2017-10-14 ENCOUNTER — Other Ambulatory Visit: Payer: Self-pay | Admitting: Pharmacist

## 2017-10-14 NOTE — Patient Outreach (Signed)
[place] [facility]) Care Management  10/14/2017  Jesus Martin Dec 26, 1960 [id number]  This [place] is covering for [facility] Pharmacist, Jaclyn Shaggy.  Third outreach attempt was made to patient today with no answer.  Per review of chart, [facility] Pharmacist has previously made two unsuccessful outreach attempts and sent outreach letter with no reply from patient.   [facility] RN has also been unsuccessful reaching patient.   Plan:  Case will be closed due to inability to maintain contact with patient.   In-basket message sent to [place].   Karrie Meres, PharmD, [place] [phone]

## 2017-10-14 NOTE — Patient Outreach (Addendum)
[facility] [facility]) Care Management  10/14/2017  JAMIE BELGER Nov 22, 1960 [id number]   Telephone Screen  Referral Date:09/27/17 Referral Source:[facility] hospital liaison Referral Reason:diabetes, medication management, A1C 11.2" Insurance:[employer]   Outreach attempt #3 to patient. No answer at present. Multiple attempts to establish contact with patient without success. No response from letter mailed to patient. Case is being closed at this time.     Plan: RN CM will notify other [facility] staff of RN CM closure.  RN CM will send case closure letter to MD.   Enzo Montgomery, RN,BSN,CCM [place] [facility] Telephonic Care Management Coordinator Direct Phone: [phone] Toll Free: [phone] Fax: [phone]

## 2017-10-14 NOTE — Progress Notes (Signed)
This encounter was created in error - please disregard.

## 2017-10-15 ENCOUNTER — Ambulatory Visit: Payer: Self-pay | Admitting: Pharmacist

## 2017-11-24 ENCOUNTER — Encounter (INDEPENDENT_AMBULATORY_CARE_PROVIDER_SITE_OTHER): Payer: Self-pay

## 2017-11-24 ENCOUNTER — Other Ambulatory Visit: Payer: Self-pay

## 2017-11-24 ENCOUNTER — Encounter: Payer: Self-pay | Admitting: Internal Medicine

## 2017-11-24 ENCOUNTER — Ambulatory Visit (INDEPENDENT_AMBULATORY_CARE_PROVIDER_SITE_OTHER): Payer: Medicare HMO | Admitting: Internal Medicine

## 2017-11-24 VITALS — BP 147/85 | HR 86 | Temp 97.6°F | Ht 73.0 in | Wt 257.2 lb

## 2017-11-24 DIAGNOSIS — K0889 Other specified disorders of teeth and supporting structures: Secondary | ICD-10-CM

## 2017-11-24 DIAGNOSIS — E119 Type 2 diabetes mellitus without complications: Secondary | ICD-10-CM

## 2017-11-24 DIAGNOSIS — K08409 Partial loss of teeth, unspecified cause, unspecified class: Secondary | ICD-10-CM | POA: Diagnosis not present

## 2017-11-24 DIAGNOSIS — Z79891 Long term (current) use of opiate analgesic: Secondary | ICD-10-CM

## 2017-11-24 DIAGNOSIS — I10 Essential (primary) hypertension: Secondary | ICD-10-CM

## 2017-11-24 DIAGNOSIS — K760 Fatty (change of) liver, not elsewhere classified: Secondary | ICD-10-CM | POA: Diagnosis not present

## 2017-11-24 DIAGNOSIS — G8921 Chronic pain due to trauma: Secondary | ICD-10-CM | POA: Diagnosis not present

## 2017-11-24 DIAGNOSIS — J449 Chronic obstructive pulmonary disease, unspecified: Secondary | ICD-10-CM | POA: Insufficient documentation

## 2017-11-24 DIAGNOSIS — Z87891 Personal history of nicotine dependence: Secondary | ICD-10-CM

## 2017-11-24 DIAGNOSIS — Z79899 Other long term (current) drug therapy: Secondary | ICD-10-CM

## 2017-11-24 DIAGNOSIS — M79672 Pain in left foot: Secondary | ICD-10-CM

## 2017-11-24 DIAGNOSIS — M79671 Pain in right foot: Secondary | ICD-10-CM

## 2017-11-24 DIAGNOSIS — G8929 Other chronic pain: Secondary | ICD-10-CM | POA: Insufficient documentation

## 2017-11-24 DIAGNOSIS — Z794 Long term (current) use of insulin: Secondary | ICD-10-CM

## 2017-11-24 DIAGNOSIS — F319 Bipolar disorder, unspecified: Secondary | ICD-10-CM | POA: Diagnosis not present

## 2017-11-24 DIAGNOSIS — Z7951 Long term (current) use of inhaled steroids: Secondary | ICD-10-CM | POA: Diagnosis not present

## 2017-11-24 DIAGNOSIS — E118 Type 2 diabetes mellitus with unspecified complications: Secondary | ICD-10-CM

## 2017-11-24 MED ORDER — TRAMADOL HCL 50 MG PO TABS: 50.0000 mg | ORAL_TABLET | Freq: Two times a day (BID) | ORAL | 0 refills | Status: DC | PRN

## 2017-11-24 MED ORDER — METFORMIN HCL 500 MG PO TABS: 500.0000 mg | ORAL_TABLET | Freq: Every day | ORAL | 1 refills | Status: DC

## 2017-11-24 NOTE — Progress Notes (Signed)
   CC: DM, HTN follow up  HPI:  [name] is a 57 y.o. male with past medical history outlined below here to establish care and follow up his diabetes and HTN. For the details of today's visit, please refer to the assessment and plan.  Past Medical History:  Diagnosis Date  . Bipolar I disorder (HCC) 1991   Dr. Toy Care  . Borderline hyperlipidemia    history of  . Childhood asthma   . COPD (chronic obstructive pulmonary disease) ([place])   . Elevated glucose   . Fibromyalgia   . GERD (gastroesophageal reflux disease)   . HTN (hypertension)   . Lower back pain   . Paranoia ([place])   . Tobacco abuse   . Tobacco abuse   . Vitamin D deficiency     Review of Systems  Constitutional: Negative for weight loss.  Eyes: Negative for blurred vision.  Respiratory: Negative for shortness of breath.   Cardiovascular: Negative for chest pain.  Gastrointestinal: Negative for abdominal pain.  Genitourinary: Negative for frequency.  Musculoskeletal:       Bilat feet pain  Neurological: Negative for dizziness and weakness.  Psychiatric/Behavioral: Positive for depression.    Family History: Unknown, patient adopted.   Social History: Quit smoking in December (45 years, 2 PPD). Occasional alcohol use. Denies illicit drug use.   Physical Exam:  Vitals:   11/24/17 0900 11/24/17 0935  BP: (!) 144/93 (!) 147/85  Pulse: 82 86  Temp: 97.6 F (36.4 C)   TempSrc: Oral   SpO2: 98%   Weight: 257 lb 3.2 oz (116.7 kg)   Height: 6' 1"  (1.854 m)     Constitutional: NAD, appears comfortable HEENT: PERRL, moist mucous membranes, poor dentition with multiple missing teeth Neck: Supple, trachea midline. No lymphadenopathy.  Cardiovascular: RRR, no murmurs, rubs, or gallops.  Pulmonary/Chest: CTAB, no wheezes, rales, or rhonchi. Abdominal: Soft, non tender, non distended. +BS.  Extremities: Warm and well perfused.  No edema.  Neurological: A&Ox3, CN II - XII grossly intact. Strength and  sensation intact bilaterally. No gross deficits.  Skin: No rashes or erythema  Psychiatric: Normal mood and affect  Assessment & Plan:   See Encounters Tab for problem based charting.  Patient discussed with Dr. Evette Doffing

## 2017-11-24 NOTE — Patient Instructions (Addendum)
FOLLOW-UP INSTRUCTIONS When: 1 month For: Diabetes follow up What to bring: Meds & Glucometer  Jesus Martin,  It was a pleasure to meet you today. I have sent refills of your tramadol to your pharamcy. I have started you on a new medicine called metformin for your diabetes. Please up titrate this as instructed. I would like for you to follow up in 1 month for diabetes follow up. We will repeat your A1c at that time. Please bring your medications and glucometer with you to that appointment. I will call you with the results of your lab work today.  If you have any questions or concerns, call our clinic at [phone] or after hours call [phone] and ask for the internal medicine resident on call.   - Dr. Philipp Ovens

## 2017-11-24 NOTE — Progress Notes (Signed)
Internal Medicine Clinic Attending  Case discussed with Dr. Guilloud at the time of the visit.  We reviewed the resident's history and exam and pertinent patient test results.  I agree with the assessment, diagnosis, and plan of care documented in the resident's note.  

## 2017-11-24 NOTE — Assessment & Plan Note (Addendum)
Blood pressure today is uncontrolled, 144/93 and 147/85 on recheck. He is currently taking amlodipine 5 mg daily.  We are checking a urine microalbumin in the setting of his diabetes.  Pending these results, will consider either increasing amlodipine or switching to an ACE/ARB if proteinuria is present. -- Continue amlodipine 5 mg for now -- Follow up urine microalbumin   ADDENDUM: Negative for microalbuminuria. Will increase amlodipine to 10 mg daily. Sent new prescription to pharmacy, left VM with instructions for patient.

## 2017-11-24 NOTE — Assessment & Plan Note (Addendum)
Patient has diffuse hepatic steatosis noted on abdominal ultrasound in February 2019.  LFTs were elevated during recent hospitalization.  Acute hepatitis panels were negative.  Hep B surface antibody was not checked.  He is unsure if he has previously been immunized. --Follow-up hep B surface antibody --Follow-up repeat LFTs --Encouraged weight loss  ADDENDUM: LFTs have normalized. Negative Hep B surface antibody. Will need hep B series initiated at follow up.

## 2017-11-24 NOTE — Assessment & Plan Note (Signed)
Patient is a former smoker, reports he quit in December 2018.  Previously smoked 2 packs/day for total of 45 years.

## 2017-11-24 NOTE — Assessment & Plan Note (Signed)
Patient has a history of type I bipolar disorder and follows with psychiatry (Dr. Toy Care).  He is currently well controlled on Lamictal, Xanax, Seroquel, and lithium.  He has follow-up scheduled.

## 2017-11-24 NOTE — Assessment & Plan Note (Signed)
COPD symptoms are well controlled on his Trelegy inhaler.  Reports infrequent use of his albuterol rescue inhaler, at most 2 times weekly.  He quit smoking in December 2018.  He has a 90-pack-year history.

## 2017-11-24 NOTE — Assessment & Plan Note (Signed)
Patient has chronic bilateral feet pain.  He denies paresthesias or symptoms of neuropathy.  His pain is worse in his right foot.  Reports an old injury requiring surgical fixation with screws and metal plates in that foot.  He has seen multiple orthopedic doctors without clear etiology for this pain.  He has been taking tramadol 50 mg 2 times daily as needed. Requesting refill. Database was checked, last filled 2 months ago. I have provided a 1 month refill. Will continue to address moving forward.

## 2017-11-24 NOTE — Assessment & Plan Note (Signed)
Patient was diagnosed with diabetes during a recently hospitalization. A1c on 09/21/2017 was 11.6.  He was discharged on 70/30 insulin 15 units 2 times daily. He checks his blood glucose regularly, unfortunately does not have his meter with him today.  He reports his AM fasting glucose levels are generally in the low 100s.  CBGs range on average in the 160s after meals.  He is on no oral medications.  Today we discussed diet and lifestyle changes.  He has been attempting to exercise regularly since discharge from the hospital and limiting his carbohydrates.  We will start metformin today with long-term goal of transitioning off insulin.  --Continue 70/30 insulin 15 units BID --Start metformin 500 mg daily, patient was given instructions to up titrate over the next 4 weeks to 1,000 mg BID --Encouraged weight loss, exercise  --Follow up 1 month with glucometer, repeat A1c --Follow up urine micro albumin

## 2017-11-25 ENCOUNTER — Encounter: Payer: Self-pay | Admitting: Internal Medicine

## 2017-11-25 LAB — CMP14 + ANION GAP
ALT: 44 IU/L (ref 0–44)
ANION GAP: 15 mmol/L (ref 10.0–18.0)
AST: 43 IU/L — AB (ref 0–40)
Albumin/Globulin Ratio: 1.7 (ref 1.2–2.2)
Albumin: 4.6 g/dL (ref 3.5–5.5)
Alkaline Phosphatase: 55 IU/L (ref 39–117)
BUN/Creatinine Ratio: 10 (ref 9–20)
BUN: 9 mg/dL (ref 6–24)
Bilirubin Total: 0.4 mg/dL (ref 0.0–1.2)
CALCIUM: 9.9 mg/dL (ref 8.7–10.2)
CO2: 26 mmol/L (ref 20–29)
CREATININE: 0.87 mg/dL (ref 0.76–1.27)
Chloride: 102 mmol/L (ref 96–106)
GFR calc Af Amer: 112 mL/min/{1.73_m2} (ref >59)
GFR, EST NON AFRICAN AMERICAN: 96 mL/min/{1.73_m2} (ref >59)
GLOBULIN, TOTAL: 2.7 g/dL (ref 1.5–4.5)
Glucose: 90 mg/dL (ref 65–99)
Potassium: 4.3 mmol/L (ref 3.5–5.2)
SODIUM: 143 mmol/L (ref 134–144)
TOTAL PROTEIN: 7.3 g/dL (ref 6.0–8.5)

## 2017-11-25 LAB — MICROALBUMIN / CREATININE URINE RATIO
Creatinine, Urine: 30.6 mg/dL
MICROALB/CREAT RATIO: 11.1 mg/g{creat} (ref 0.0–30.0)
Microalbumin, Urine: 3.4 ug/mL

## 2017-11-25 LAB — HEPATITIS B SURFACE ANTIBODY,QUALITATIVE: Hep B Surface Ab, Qual: NONREACTIVE

## 2017-11-29 MED ORDER — AMLODIPINE BESYLATE 10 MG PO TABS: 10.0000 mg | ORAL_TABLET | Freq: Every day | ORAL | 2 refills | Status: DC

## 2017-11-29 NOTE — Addendum Note (Signed)
Addended by: Jodean Lima on: 11/29/2017 08:42 AM   Modules accepted: Orders

## 2017-12-17 ENCOUNTER — Other Ambulatory Visit: Payer: Self-pay | Admitting: Internal Medicine

## 2017-12-17 MED ORDER — TRELEGY ELLIPTA 100-62.5-25 MCG/INH IN AEPB: 1.0000 | INHALATION_SPRAY | Freq: Every day | RESPIRATORY_TRACT | 5 refills | Status: DC

## 2017-12-17 NOTE — Telephone Encounter (Signed)
Refill Request   TRELEGY ELLIPTA 100-62.5-25 MCG/INH AEPB

## 2017-12-22 ENCOUNTER — Ambulatory Visit (INDEPENDENT_AMBULATORY_CARE_PROVIDER_SITE_OTHER): Payer: Medicare HMO | Admitting: Internal Medicine

## 2017-12-22 ENCOUNTER — Other Ambulatory Visit: Payer: Self-pay

## 2017-12-22 VITALS — BP 143/84 | HR 90 | Temp 97.5°F | Wt 257.7 lb

## 2017-12-22 DIAGNOSIS — Z794 Long term (current) use of insulin: Secondary | ICD-10-CM

## 2017-12-22 DIAGNOSIS — F17220 Nicotine dependence, chewing tobacco, uncomplicated: Secondary | ICD-10-CM

## 2017-12-22 DIAGNOSIS — E1142 Type 2 diabetes mellitus with diabetic polyneuropathy: Secondary | ICD-10-CM

## 2017-12-22 DIAGNOSIS — Z79899 Other long term (current) drug therapy: Secondary | ICD-10-CM | POA: Diagnosis not present

## 2017-12-22 DIAGNOSIS — Z79891 Long term (current) use of opiate analgesic: Secondary | ICD-10-CM

## 2017-12-22 DIAGNOSIS — R464 Slowness and poor responsiveness: Secondary | ICD-10-CM | POA: Diagnosis not present

## 2017-12-22 DIAGNOSIS — G8929 Other chronic pain: Secondary | ICD-10-CM | POA: Diagnosis not present

## 2017-12-22 DIAGNOSIS — E119 Type 2 diabetes mellitus without complications: Secondary | ICD-10-CM | POA: Insufficient documentation

## 2017-12-22 DIAGNOSIS — E1140 Type 2 diabetes mellitus with diabetic neuropathy, unspecified: Secondary | ICD-10-CM

## 2017-12-22 DIAGNOSIS — E118 Type 2 diabetes mellitus with unspecified complications: Secondary | ICD-10-CM

## 2017-12-22 HISTORY — DX: Type 2 diabetes mellitus without complications: E11.9

## 2017-12-22 LAB — GLUCOSE, CAPILLARY: Glucose-Capillary: 124 mg/dL — ABNORMAL HIGH (ref 65–99)

## 2017-12-22 LAB — POCT GLYCOSYLATED HEMOGLOBIN (HGB A1C): HEMOGLOBIN A1C: 6

## 2017-12-22 MED ORDER — TRELEGY ELLIPTA 100-62.5-25 MCG/INH IN AEPB: 1.0000 | INHALATION_SPRAY | Freq: Every day | RESPIRATORY_TRACT | 3 refills | Status: AC

## 2017-12-22 MED ORDER — METFORMIN HCL 1000 MG PO TABS: 1000.0000 mg | ORAL_TABLET | Freq: Two times a day (BID) | ORAL | 3 refills | Status: DC

## 2017-12-22 MED ORDER — TRAMADOL HCL 50 MG PO TABS: 50.0000 mg | ORAL_TABLET | Freq: Two times a day (BID) | ORAL | 0 refills | Status: DC | PRN

## 2017-12-22 MED ORDER — PREGABALIN 75 MG PO CAPS: 75.0000 mg | ORAL_CAPSULE | Freq: Two times a day (BID) | ORAL | 2 refills | Status: DC

## 2017-12-22 NOTE — Assessment & Plan Note (Addendum)
Lab Results  Component Value Date   HGBA1C 6.0 12/22/2017  Patient's blood sugar log looked great today.  Lowest sugar 95, average sugar was about 120.  Taking 1054m metformin BID not experiencing side effects.  Taking humalog 75/25 15 units BID.    -Continue current regimen above -will perform foot exam and ABI's today

## 2017-12-22 NOTE — Assessment & Plan Note (Addendum)
After talking with patient he has been on tramadol for many years for his legs, knees and lower back.  Checked database and confirmed that patient has been on this med for years and taking appropriately.  Pt has no assigned PCP at this time.  -Will refill one month supply of tramadol 14m BID PRN

## 2017-12-22 NOTE — Assessment & Plan Note (Signed)
Pt with bilateral foot pain with some features of neuropathy and some that are not so characteristic.  Patient has a tough time with describing his pain, likely due to his mental slowing from heavy doses of psychiatric medications.  He does seem to have some numbness and tingling in his fingers as well.  His pain is worse with movement but is present to a much more mild degree at rest.    -ABI's performed in office returned as having normal circulation -will give a trial of 50m BID of lyrica for presumed neuropathy

## 2017-12-22 NOTE — Patient Instructions (Addendum)
Jesus Martin, your diabetes is doing very well.  Continue on your current therapy.  We will check the blood flow to your feet today.  We will try you back on lyrica for your neuropathy.

## 2017-12-22 NOTE — Progress Notes (Signed)
CC: bilateral foot pain, T2DM follow up  HPI:  [name] is a 57 y.o. male with PMH below.  He is here to follow up for his T2DM, address his bilateral foot pain and to continue to establish care.    Please see A&P for status of the patient's chronic medical conditions  Past Medical History:  Diagnosis Date  . Bipolar I disorder (HCC) 1991   Dr. Toy Care  . Borderline hyperlipidemia    history of  . Childhood asthma   . COPD (chronic obstructive pulmonary disease) ([place])   . Elevated glucose   . Fibromyalgia   . GERD (gastroesophageal reflux disease)   . HTN (hypertension)   . Lower back pain   . Paranoia ([place])   . Tobacco abuse   . Tobacco abuse   . Vitamin D deficiency    Review of Systems:  ROS: Pulmonary: pt denies increased work of breathing, shortness of breath,  Cardiac: pt denies palpitations, chest pain,  Abdominal: pt denies abdominal pain, nausea, vomiting, or diarrhea  Physical Exam:  Vitals:   12/22/17 0954  BP: (!) 143/84  Pulse: 90  Temp: (!) 97.5 F (36.4 C)  TempSrc: Oral  SpO2: 99%  Weight: 257 lb 11.2 oz (116.9 kg)   Physical Exam  Constitutional: He appears well-developed and well-nourished.  Eyes: Right eye exhibits no discharge. Left eye exhibits no discharge. No scleral icterus.  Cardiovascular: Normal rate, regular rhythm, normal heart sounds and intact distal pulses. Exam reveals no gallop and no friction rub.  No murmur heard. Pulses:      Dorsalis pedis pulses are 2+ on the right side, and 2+ on the left side.       Posterior tibial pulses are 2+ on the right side, and 2+ on the left side.  Pulmonary/Chest: Effort normal and breath sounds normal. No respiratory distress. He has no wheezes. He has no rales.  Abdominal: Soft. Bowel sounds are normal. He exhibits no distension and no mass. There is no tenderness. There is no guarding.  Neurological: He is alert.    Social History   Socioeconomic History  . Marital status:  Married    Spouse name: Not on file  . Number of children: Not on file  . Years of education: Not on file  . Highest education level: Not on file  Occupational History  . Not on file  Social Needs  . Financial resource strain: Not on file  . Food insecurity:    Worry: Not on file    Inability: Not on file  . Transportation needs:    Medical: Not on file    Non-medical: Not on file  Tobacco Use  . Smoking status: Former Smoker    Packs/day: 1.00    Years: 45.00    Pack years: 45.00    Types: Cigarettes    Last attempt to quit: 07/10/2017    Years since quitting: 0.4  . Smokeless tobacco: Current User    Types: Snuff  . Tobacco comment: 1 pack a day with nicotine patch  Substance and Sexual Activity  . Alcohol use: Yes    Alcohol/week: 8.4 oz    Types: 14 Cans of beer per week    Comment: Recently started drinking 2-4 cans of beer a day  . Drug use: No  . Sexual activity: Not on file  Lifestyle  . Physical activity:    Days per week: Not on file    Minutes per session: Not on file  .  Stress: Not on file  Relationships  . Social connections:    Talks on phone: Not on file    Gets together: Not on file    Attends religious service: Not on file    Active member of club or organization: Not on file    Attends meetings of clubs or organizations: Not on file    Relationship status: Not on file  . Intimate partner violence:    Fear of current or ex partner: Not on file    Emotionally abused: Not on file    Physically abused: Not on file    Forced sexual activity: Not on file  Other Topics Concern  . Not on file  Social History Narrative   Lives with wife Jesus Martin (married 30 years) and son. Has 4 dogs and fish. Not employed. Woodworking for fun. HS graduate.       Quit smoking in 2008. 26 pack years. No recreational drug use. Drinks 1 glass of wine per month. Does not exercise.     Family History  Adopted: Yes    Assessment & Plan:   See Encounters Tab  for problem based charting.  Patient discussed with Dr. Angelia Martin

## 2017-12-27 DIAGNOSIS — H5203 Hypermetropia, bilateral: Secondary | ICD-10-CM | POA: Diagnosis not present

## 2017-12-27 DIAGNOSIS — E119 Type 2 diabetes mellitus without complications: Secondary | ICD-10-CM | POA: Diagnosis not present

## 2017-12-27 DIAGNOSIS — H2513 Age-related nuclear cataract, bilateral: Secondary | ICD-10-CM | POA: Diagnosis not present

## 2017-12-27 NOTE — Progress Notes (Signed)
Internal Medicine Clinic Attending  Case discussed with Dr. Winfrey  at the time of the visit.  We reviewed the resident's history and exam and pertinent patient test results.  I agree with the assessment, diagnosis, and plan of care documented in the resident's note.  

## 2018-01-07 ENCOUNTER — Telehealth: Payer: Self-pay

## 2018-01-07 NOTE — Telephone Encounter (Signed)
Requesting lab results. Please call back.

## 2018-01-07 NOTE — Telephone Encounter (Signed)
Spoke with pt about a1c result, was point of care so thought I already discussed with him but must have been ordered at end of visit.

## 2018-01-19 ENCOUNTER — Other Ambulatory Visit: Payer: Self-pay | Admitting: *Deleted

## 2018-01-19 DIAGNOSIS — G8929 Other chronic pain: Secondary | ICD-10-CM

## 2018-01-19 MED ORDER — TRAMADOL HCL 50 MG PO TABS: 50.0000 mg | ORAL_TABLET | Freq: Two times a day (BID) | ORAL | 0 refills | Status: DC | PRN

## 2018-01-19 MED ORDER — TRAMADOL HCL 50 MG PO TABS: 50.0000 mg | ORAL_TABLET | Freq: Two times a day (BID) | ORAL | 0 refills | Status: AC | PRN

## 2018-01-19 MED ORDER — TRAMADOL HCL 50 MG PO TABS
50.0000 mg | ORAL_TABLET | Freq: Two times a day (BID) | ORAL | 0 refills | Status: AC | PRN
Start: 2018-01-19 — End: 2018-02-18

## 2018-01-19 NOTE — Telephone Encounter (Signed)
Refilled prescription

## 2018-03-03 ENCOUNTER — Other Ambulatory Visit: Payer: Self-pay | Admitting: Internal Medicine

## 2018-03-03 MED ORDER — VENTOLIN HFA 108 (90 BASE) MCG/ACT IN AERS: 2.0000 | INHALATION_SPRAY | Freq: Four times a day (QID) | RESPIRATORY_TRACT | 11 refills | Status: DC | PRN

## 2018-03-03 NOTE — Telephone Encounter (Signed)
refilled 

## 2018-03-03 NOTE — Telephone Encounter (Signed)
Needs refill on Ventolin inhaler (COPD) @ [facility] on [address], ask pharmacy told pt that they need more dosage; pt contact# [phone]

## 2018-03-04 ENCOUNTER — Other Ambulatory Visit: Payer: Self-pay | Admitting: Internal Medicine

## 2018-03-11 ENCOUNTER — Other Ambulatory Visit: Payer: Self-pay | Admitting: Internal Medicine

## 2018-03-11 DIAGNOSIS — I10 Essential (primary) hypertension: Secondary | ICD-10-CM

## 2018-03-11 NOTE — Telephone Encounter (Signed)
refilled 

## 2018-03-11 NOTE — Telephone Encounter (Signed)
Next appt scheduled 8/19 with PCP.

## 2018-03-14 ENCOUNTER — Other Ambulatory Visit: Payer: Self-pay | Admitting: Internal Medicine

## 2018-03-14 DIAGNOSIS — E119 Type 2 diabetes mellitus without complications: Secondary | ICD-10-CM

## 2018-03-15 NOTE — Telephone Encounter (Signed)
Next appt scheduled 8/19 with PCP.

## 2018-03-15 NOTE — Telephone Encounter (Signed)
refilled 

## 2018-03-28 ENCOUNTER — Ambulatory Visit (INDEPENDENT_AMBULATORY_CARE_PROVIDER_SITE_OTHER): Payer: Medicare HMO | Admitting: Internal Medicine

## 2018-03-28 ENCOUNTER — Encounter: Payer: Self-pay | Admitting: Internal Medicine

## 2018-03-28 ENCOUNTER — Other Ambulatory Visit: Payer: Self-pay

## 2018-03-28 VITALS — BP 131/81 | HR 102 | Temp 98.0°F | Ht 73.0 in | Wt 259.1 lb

## 2018-03-28 DIAGNOSIS — Z794 Long term (current) use of insulin: Secondary | ICD-10-CM

## 2018-03-28 DIAGNOSIS — Z720 Tobacco use: Secondary | ICD-10-CM | POA: Diagnosis not present

## 2018-03-28 DIAGNOSIS — Z87891 Personal history of nicotine dependence: Secondary | ICD-10-CM

## 2018-03-28 DIAGNOSIS — F319 Bipolar disorder, unspecified: Secondary | ICD-10-CM

## 2018-03-28 DIAGNOSIS — E119 Type 2 diabetes mellitus without complications: Secondary | ICD-10-CM | POA: Diagnosis not present

## 2018-03-28 DIAGNOSIS — Z79899 Other long term (current) drug therapy: Secondary | ICD-10-CM

## 2018-03-28 DIAGNOSIS — Z23 Encounter for immunization: Secondary | ICD-10-CM | POA: Diagnosis not present

## 2018-03-28 DIAGNOSIS — E118 Type 2 diabetes mellitus with unspecified complications: Secondary | ICD-10-CM | POA: Diagnosis not present

## 2018-03-28 LAB — POCT GLYCOSYLATED HEMOGLOBIN (HGB A1C): HEMOGLOBIN A1C: 5.3 % (ref 4.0–5.6)

## 2018-03-28 LAB — GLUCOSE, CAPILLARY: Glucose-Capillary: 111 mg/dL — ABNORMAL HIGH (ref 70–99)

## 2018-03-28 NOTE — Assessment & Plan Note (Addendum)
3 wks ago started smoking about 1-2 cigs every 2-3 days.  He reports he has had a relative visiting and smoking around him recently which caused him to relapse.  I asked if we could help him get back off completely but he would like to try himself does not want patch or gum.  Wouldn't be a candidate for chantix or buproprion given bipolar meds.    -continue to monitor

## 2018-03-28 NOTE — Progress Notes (Signed)
CC: follow up of T2DM, tobacco use  HPI:  [name] is a 57 y.o. male with PMH below.  He is here to follow up on T2DM and tobacco use.    Please see A&P for status of the patient's chronic medical conditions  Past Medical History:  Diagnosis Date  . Bipolar I disorder (HCC) 1991   Dr. Toy Care  . Borderline hyperlipidemia    history of  . Childhood asthma   . COPD (chronic obstructive pulmonary disease) ([place])   . Diabetes mellitus, new onset ([place]) 12/22/2017  . Elevated glucose   . Fibromyalgia   . GERD (gastroesophageal reflux disease)   . HTN (hypertension)   . Lower back pain   . Paranoia ([place])   . Tobacco abuse   . Tobacco abuse   . Vitamin D deficiency    Review of Systems:  ROS: Pulmonary: pt denies increased work of breathing, shortness of breath,  Cardiac: pt denies palpitations, chest pain,  Abdominal: pt denies abdominal pain, nausea, vomiting, or diarrhea  Physical Exam:  Vitals:   03/28/18 1326  BP: 131/81  Pulse: (!) 102  Temp: 98 F (36.7 C)  TempSrc: Oral  SpO2: 95%  Weight: 259 lb 1.6 oz (117.5 kg)  Height: 6' 1"  (1.854 m)   Cardiac: normal rate and rhythm, clear s1 and s2, no murmurs Pulmonary: CTAB, not in distress Abdominal: non distended abdomen, soft and nontender Extremities: no LE edema Mental status: Alert, conversant, follows commands  Social History   Socioeconomic History  . Marital status: Married    Spouse name: Not on file  . Number of children: Not on file  . Years of education: Not on file  . Highest education level: Not on file  Occupational History  . Not on file  Social Needs  . Financial resource strain: Not on file  . Food insecurity:    Worry: Not on file    Inability: Not on file  . Transportation needs:    Medical: Not on file    Non-medical: Not on file  Tobacco Use  . Smoking status: Former Smoker    Packs/day: 1.00    Years: 45.00    Pack years: 45.00    Types: Cigarettes    Last attempt  to quit: 07/10/2017    Years since quitting: 0.7  . Smokeless tobacco: Current User    Types: Snuff  . Tobacco comment: 1 pack a day with nicotine patch  Substance and Sexual Activity  . Alcohol use: Yes    Alcohol/week: 14.0 standard drinks    Types: 14 Cans of beer per week    Comment: Recently started drinking 2-4 cans of beer a day  . Drug use: No  . Sexual activity: Not on file  Lifestyle  . Physical activity:    Days per week: Not on file    Minutes per session: Not on file  . Stress: Not on file  Relationships  . Social connections:    Talks on phone: Not on file    Gets together: Not on file    Attends religious service: Not on file    Active member of club or organization: Not on file    Attends meetings of clubs or organizations: Not on file    Relationship status: Not on file  . Intimate partner violence:    Fear of current or ex partner: Not on file    Emotionally abused: Not on file    Physically abused: Not  on file    Forced sexual activity: Not on file  Other Topics Concern  . Not on file  Social History Narrative   Lives with wife Jesus Martin (married 52 years) and son. Has 4 dogs and fish. Not employed. Woodworking for fun. HS graduate.       Quit smoking in 2008. 26 pack years. No recreational drug use. Drinks 1 glass of wine per month. Does not exercise.     Family History  Adopted: Yes    Assessment & Plan:   See Encounters Tab for problem based charting.  Patient discussed with Dr. Lynnae January

## 2018-03-28 NOTE — Patient Instructions (Addendum)
Status:  Final result Visible to patient:  Yes (MyChart) Next appt:  None Dx:  Diabetes mellitus, new onset ([place])  Component 61moago 12/22/17  Hemoglobin A1C 6.0        Lab Results  Component Value Date   HGBA1C 5.3 03/28/2018   Mr. Linck above is your A1C from your last visit and today's visit.  Your diabetes is doing excellent.  Keep up the good work.  We took some labs today metabolic panel and a cholesterol test.  We will send you the results in the mail.  Based on these results I may start you on a cholesterol medicine to help prevent heart attacks and stroke from high cholesterol.  You can follow up with me in 6 months or sooner if you need me.  Please work on giving the cigarettes up again.

## 2018-03-28 NOTE — Assessment & Plan Note (Addendum)
Lab Results  Component Value Date   HGBA1C 5.3 03/28/2018   Pt reports no low blood sugars lowest it has been is 90 pt brought extensive log of sugars and they are excellent.  Lisinopril d/ced due to making patient's creatinine rise consistently after trial by prior PCP.  No proteinuria on last urine microalbumin.  He is doing very well the prospect of coming off the insulin was mentioned by his wife and he shot this down immediately.  Not complaining of neuropathy symptoms today.    -Continue 15 units BID of insulin 75/25 -continue metformin 1048m BID -repeat bmp and will check a lipid panel today with intent of starting statin

## 2018-03-29 LAB — BMP8+ANION GAP
ANION GAP: 14 mmol/L (ref 10.0–18.0)
BUN/Creatinine Ratio: 9 (ref 9–20)
BUN: 9 mg/dL (ref 6–24)
CO2: 25 mmol/L (ref 20–29)
Calcium: 10.6 mg/dL — ABNORMAL HIGH (ref 8.7–10.2)
Chloride: 103 mmol/L (ref 96–106)
Creatinine, Ser: 0.99 mg/dL (ref 0.76–1.27)
GFR calc Af Amer: 98 mL/min/{1.73_m2} (ref >59)
GFR calc non Af Amer: 85 mL/min/{1.73_m2} (ref >59)
Glucose: 116 mg/dL — ABNORMAL HIGH (ref 65–99)
Potassium: 4.4 mmol/L (ref 3.5–5.2)
SODIUM: 142 mmol/L (ref 134–144)

## 2018-03-29 LAB — LIPID PANEL
CHOLESTEROL TOTAL: 197 mg/dL (ref 100–199)
Chol/HDL Ratio: 3.5 ratio (ref 0.0–5.0)
HDL: 57 mg/dL (ref >39)
LDL CALC: 116 mg/dL — AB (ref 0–99)
TRIGLYCERIDES: 120 mg/dL (ref 0–149)
VLDL Cholesterol Cal: 24 mg/dL (ref 5–40)

## 2018-03-29 NOTE — Progress Notes (Signed)
Internal Medicine Clinic Attending  Case discussed with Dr. Winfrey  at the time of the visit.  We reviewed the resident's history and exam and pertinent patient test results.  I agree with the assessment, diagnosis, and plan of care documented in the resident's note.  

## 2018-03-30 ENCOUNTER — Encounter: Payer: Self-pay | Admitting: Internal Medicine

## 2018-03-31 ENCOUNTER — Telehealth: Payer: Self-pay | Admitting: Internal Medicine

## 2018-03-31 DIAGNOSIS — E7800 Pure hypercholesterolemia, unspecified: Secondary | ICD-10-CM

## 2018-03-31 MED ORDER — ATORVASTATIN CALCIUM 40 MG PO TABS: 40.0000 mg | ORAL_TABLET | Freq: Every day | ORAL | 3 refills | Status: DC

## 2018-03-31 NOTE — Telephone Encounter (Signed)
discussed results with patient will start him on atorvastatin 29m recheck lipid panel at next visit

## 2018-04-18 ENCOUNTER — Other Ambulatory Visit: Payer: Self-pay | Admitting: Internal Medicine

## 2018-04-18 DIAGNOSIS — G8929 Other chronic pain: Secondary | ICD-10-CM

## 2018-04-19 ENCOUNTER — Telehealth: Payer: Self-pay | Admitting: *Deleted

## 2018-04-19 MED ORDER — FLUTICASONE-UMECLIDIN-VILANT 100-62.5-25 MCG/INH IN AEPB: 1.0000 | INHALATION_SPRAY | Freq: Every day | RESPIRATORY_TRACT | 1 refills | Status: DC

## 2018-04-19 NOTE — Telephone Encounter (Signed)
traMADol (ULTRAM) 50 MG tablet, and trelegy to be filled @   [place], [place] - [place] [address] [phone] (Phone) [phone] (Fax)

## 2018-04-19 NOTE — Telephone Encounter (Signed)
Refilled this

## 2018-04-19 NOTE — Telephone Encounter (Signed)
Call from pt's wife - requesting a refill on Trelegy, not on current med list. Wife stated pt's doctor told them he will refill. She stated he has been taken it ; requesting 30 supply. Thanks

## 2018-04-19 NOTE — Telephone Encounter (Signed)
Trelegy discontinued 5/18?

## 2018-04-21 MED ORDER — FLUTICASONE-UMECLIDIN-VILANT 100-62.5-25 MCG/INH IN AEPB: 1.0000 | INHALATION_SPRAY | Freq: Every day | RESPIRATORY_TRACT | 1 refills | Status: DC

## 2018-04-21 NOTE — Telephone Encounter (Signed)
Done

## 2018-04-21 NOTE — Telephone Encounter (Signed)
Received fax from pharmacy stating Qty 28 is 2 week supply. Requesting Qty of 60 for 30 day supply.Hubbard Hartshorn, RN, BSN

## 2018-04-21 NOTE — Addendum Note (Signed)
Addended by: Aldine Contes on: 04/21/2018 01:58 PM   Modules accepted: Orders

## 2018-05-15 ENCOUNTER — Other Ambulatory Visit: Payer: Self-pay | Admitting: Internal Medicine

## 2018-05-15 DIAGNOSIS — G8929 Other chronic pain: Secondary | ICD-10-CM

## 2018-05-17 ENCOUNTER — Other Ambulatory Visit: Payer: Self-pay | Admitting: Internal Medicine

## 2018-05-17 DIAGNOSIS — G8929 Other chronic pain: Secondary | ICD-10-CM

## 2018-05-17 MED ORDER — TRAMADOL HCL 50 MG PO TABS: 50.0000 mg | ORAL_TABLET | Freq: Two times a day (BID) | ORAL | 0 refills | Status: AC | PRN

## 2018-05-17 MED ORDER — TRAMADOL HCL 50 MG PO TABS: 50.0000 mg | ORAL_TABLET | Freq: Two times a day (BID) | ORAL | 0 refills | Status: DC | PRN

## 2018-06-06 ENCOUNTER — Ambulatory Visit: Payer: Medicare HMO

## 2018-06-07 ENCOUNTER — Ambulatory Visit (INDEPENDENT_AMBULATORY_CARE_PROVIDER_SITE_OTHER): Payer: Medicare HMO | Admitting: Internal Medicine

## 2018-06-07 ENCOUNTER — Telehealth: Payer: Self-pay

## 2018-06-07 ENCOUNTER — Other Ambulatory Visit: Payer: Self-pay

## 2018-06-07 VITALS — BP 155/92 | HR 94 | Temp 97.8°F | Ht 73.0 in | Wt 267.3 lb

## 2018-06-07 DIAGNOSIS — Z8639 Personal history of other endocrine, nutritional and metabolic disease: Secondary | ICD-10-CM

## 2018-06-07 DIAGNOSIS — E538 Deficiency of other specified B group vitamins: Secondary | ICD-10-CM

## 2018-06-07 DIAGNOSIS — E1142 Type 2 diabetes mellitus with diabetic polyneuropathy: Secondary | ICD-10-CM | POA: Diagnosis not present

## 2018-06-07 DIAGNOSIS — Z794 Long term (current) use of insulin: Secondary | ICD-10-CM | POA: Diagnosis not present

## 2018-06-07 DIAGNOSIS — E119 Type 2 diabetes mellitus without complications: Secondary | ICD-10-CM

## 2018-06-07 HISTORY — DX: Deficiency of other specified B group vitamins: E53.8

## 2018-06-07 MED ORDER — PREGABALIN 75 MG PO CAPS: 150.0000 mg | ORAL_CAPSULE | Freq: Two times a day (BID) | ORAL | 2 refills | Status: DC

## 2018-06-07 MED ORDER — PREGABALIN 150 MG PO CAPS: 150.0000 mg | ORAL_CAPSULE | Freq: Two times a day (BID) | ORAL | 0 refills | Status: DC

## 2018-06-07 NOTE — Patient Instructions (Signed)
FOLLOW-UP INSTRUCTIONS When: 3-4 months For: routine What to bring: All of your medications and your glucometer  I have made increased your lyrica today to 114m twice daily. Please be cautious with this medication as it can cause confusion or slowing mentally at higher doses. Please do not drive while taking this medication. You may not notice these symptoms at the current dose.  Today we discussed your diabetes and diabetic neuropathy. Given your history of low Vit B12, I will obtain that lab today and notify you of the results of that lab when they are available to me.   [facility]. If you have any questions or concerns please call [place]at [phone]

## 2018-06-07 NOTE — Telephone Encounter (Signed)
Pt states insurance will not covered pregabalin (LYRICA) 75 MG capsule. Pleases call back.

## 2018-06-07 NOTE — Assessment & Plan Note (Signed)
Diabetic peripheral neuropathy: Started on Lyrica 39m BID on his last visit for bilateral foot pain. The pain is severe at its worst, limited to the ankle and distally most notably the plantar surface of the foot, the pain is worse with walking, better with rest and slightly improved with Lyrica.  It limits his ability to walk, cook or perform other ADLs.  The patient has long-standing insulin-dependent diabetes with his most recent hemoglobin A1c at 5.3%.  The patient's prior hemoglobin A1c of 11.6% approximately 8 months ago has improved notably.  He is below his goal 7% and does not require advancing treatment.  He will see his PCP for follow-up in the next few months to determine if dosage adjustments to his current regimen necessary due to his current A1c. Physical exam the patient's feet bilaterally reveal slightly decreased sensation, increased tenderness, large callus on posterior right heel, absent ulceration, absent abrasion, absent skin break, with no notable varicose veins all findings are consistent with diabetic sequela. Recent ABIs were within normal limits. Given patient's admitted history of low vitamin B12 in the past we will obtain a vitamin B12 to better rule out other potential sources of peripheral neuropathy. Patient may benefit from an EMG study but we will defer this per his request at this time.  Will obtain vitamin B12 and increase his Lyrica.  Plan: B12.  We will reflex this to MMA if low normal (approximately 400 or below) Increase Lyrica to 150 mg twice daily.  Patient given strict return precautions and cautionary use of this medication advised

## 2018-06-07 NOTE — Progress Notes (Signed)
   CC: bilateral foot pain  HPI:[name] is a 57 y.o. male who presents for evaluation of worsening bilateral foot pain. Please see individual problem based A/P for details.  Diabetic peripheral neuropathy: Started on Lyrica 61m BID on his last visit for bilateral foot pain. The pain is severe at its worst, limited to the ankle and distally most notably the plantar surface of the foot, the pain is worse with walking, better with rest and slightly improved with Lyrica.  It limits his ability to walk, cook or perform other ADLs.  The patient has long-standing insulin-dependent diabetes with his most recent hemoglobin A1c at 5.3%.  The patient's prior hemoglobin A1c of 11.6% approximately 8 months ago has improved notably.  He is below his goal 7% and does not require advancing treatment.  He will see his PCP for follow-up in the next few months to determine if dosage adjustments to his current regimen necessary due to his current A1c. Physical exam the patient's feet bilaterally reveal slightly decreased sensation, increased tenderness, large callus on posterior right heel, absent ulceration, absent abrasion, absent skin break, with no notable varicose veins all findings are consistent with diabetic sequela. Recent ABIs were within normal limits. Given patient's admitted history of low vitamin B12 in the past we will obtain a vitamin B12 to better rule out other potential sources of peripheral neuropathy. Patient may benefit from an EMG study but we will defer this per his request at this time.  Will obtain vitamin B12 and increase his Lyrica.  Plan: B12.  We will reflex this to MMA if low normal (approximately 400 or below) Increase Lyrica to 150 mg twice daily.  Patient given strict return precautions and cautionary use of this medication advised  History of hypovitaminosis B-12: Will obtain a B-12 level today as above.   Past Medical History:  Diagnosis Date  . Bipolar I disorder  (HCC) 1991   Dr. KToy Care . Borderline hyperlipidemia    history of  . Childhood asthma   . [place] disease) [place]   . [place]  . Elevated glucose   . Fibromyalgia   . GERD (gastroesophageal reflux disease)   . HTN (hypertension)   . Lower back pain   . [place]ia [place]   . Tobacco abuse   . Tobacco abuse   . Vitamin D deficiency    Review of Systems: ROS negative except as per HPI  Physical Exam: Vitals:   06/07/18 1000  BP: (!) 155/92  Pulse: 94  Temp: 97.8 F (36.6 C)  TempSrc: Oral  SpO2: 98%  Weight: 267 lb 4.8 oz (121.2 kg)  Height: 6' 1"  (1.854 m)   General: A/O x4, no acute distress, afebrile, nondiaphoretic MSK: See A/P for diabetic neuropathy  Assessment & Plan:   See Encounters Tab for problem based charting.  Patient discussed with Dr. VEvette Doffing

## 2018-06-08 ENCOUNTER — Encounter: Payer: Self-pay | Admitting: Internal Medicine

## 2018-06-08 LAB — VITAMIN B12: Vitamin B-12: 982 pg/mL (ref 232–1245)

## 2018-06-08 NOTE — Progress Notes (Signed)
Internal Medicine Clinic Attending  Case discussed with Dr. Harbrecht at the time of the visit.  We reviewed the resident's history and exam and pertinent patient test results.  I agree with the assessment, diagnosis, and plan of care documented in the resident's note.   

## 2018-06-08 NOTE — Progress Notes (Signed)
Patient mailed lab result and recent note.

## 2018-08-14 ENCOUNTER — Other Ambulatory Visit: Payer: Self-pay | Admitting: Internal Medicine

## 2018-08-14 DIAGNOSIS — G8929 Other chronic pain: Secondary | ICD-10-CM

## 2018-08-16 ENCOUNTER — Telehealth: Payer: Self-pay | Admitting: Dietician

## 2018-08-16 MED ORDER — TRAMADOL HCL 50 MG PO TABS: 50.0000 mg | ORAL_TABLET | Freq: Two times a day (BID) | ORAL | 0 refills | Status: AC | PRN

## 2018-08-16 MED ORDER — TRAMADOL HCL 50 MG PO TABS: 50.0000 mg | ORAL_TABLET | Freq: Two times a day (BID) | ORAL | 0 refills | Status: DC | PRN

## 2018-08-16 NOTE — Telephone Encounter (Signed)
Next appt scheduled 09/26/18 with PCP.

## 2018-08-16 NOTE — Telephone Encounter (Signed)
Sounds good

## 2018-08-16 NOTE — Telephone Encounter (Signed)
Call to offer patient CGM per CGM intern project. Spoke with wife who will share this information with the patient and call us back.

## 2018-08-17 ENCOUNTER — Telehealth: Payer: Self-pay | Admitting: Dietician

## 2018-08-17 DIAGNOSIS — E119 Type 2 diabetes mellitus without complications: Secondary | ICD-10-CM

## 2018-08-17 MED ORDER — ACCU-CHEK GUIDE W/DEVICE KIT: 1.0000 | PACK | Freq: Three times a day (TID) | 1 refills | Status: DC

## 2018-08-17 MED ORDER — GLUCOSE BLOOD VI STRP: ORAL_STRIP | 12 refills | Status: DC

## 2018-08-17 MED ORDER — ACCU-CHEK FASTCLIX LANCETS MISC: 11 refills | Status: DC

## 2018-08-17 NOTE — Telephone Encounter (Signed)
Patients wife called to discuss CGM appointment. She agrees to one same day he sees Dr. Shan Levans in February. She says Mr. Fluellen is having trouble with high blood sugars right now. Testing supplies limited by then paying cash. Discussed Medicare coverage of testing supplies. She request meter and supplies prescription be sent to [facility].

## 2018-08-30 ENCOUNTER — Other Ambulatory Visit: Payer: Self-pay | Admitting: Internal Medicine

## 2018-08-30 DIAGNOSIS — E119 Type 2 diabetes mellitus without complications: Secondary | ICD-10-CM

## 2018-08-31 NOTE — Telephone Encounter (Signed)
refilled 

## 2018-09-26 ENCOUNTER — Encounter: Payer: Self-pay | Admitting: Internal Medicine

## 2018-09-26 ENCOUNTER — Other Ambulatory Visit: Payer: Self-pay

## 2018-09-26 ENCOUNTER — Ambulatory Visit (INDEPENDENT_AMBULATORY_CARE_PROVIDER_SITE_OTHER): Payer: Medicare HMO | Admitting: Dietician

## 2018-09-26 ENCOUNTER — Encounter: Payer: Self-pay | Admitting: Dietician

## 2018-09-26 ENCOUNTER — Ambulatory Visit (INDEPENDENT_AMBULATORY_CARE_PROVIDER_SITE_OTHER): Payer: Medicare HMO | Admitting: Internal Medicine

## 2018-09-26 VITALS — BP 150/88 | HR 88 | Temp 97.8°F | Ht 73.0 in | Wt 269.3 lb

## 2018-09-26 DIAGNOSIS — E1140 Type 2 diabetes mellitus with diabetic neuropathy, unspecified: Secondary | ICD-10-CM

## 2018-09-26 DIAGNOSIS — M79671 Pain in right foot: Secondary | ICD-10-CM | POA: Diagnosis not present

## 2018-09-26 DIAGNOSIS — F17210 Nicotine dependence, cigarettes, uncomplicated: Secondary | ICD-10-CM

## 2018-09-26 DIAGNOSIS — F17220 Nicotine dependence, chewing tobacco, uncomplicated: Secondary | ICD-10-CM | POA: Diagnosis not present

## 2018-09-26 DIAGNOSIS — M79672 Pain in left foot: Secondary | ICD-10-CM | POA: Diagnosis not present

## 2018-09-26 DIAGNOSIS — E1142 Type 2 diabetes mellitus with diabetic polyneuropathy: Secondary | ICD-10-CM | POA: Diagnosis not present

## 2018-09-26 DIAGNOSIS — E785 Hyperlipidemia, unspecified: Secondary | ICD-10-CM

## 2018-09-26 DIAGNOSIS — Z79899 Other long term (current) drug therapy: Secondary | ICD-10-CM | POA: Diagnosis not present

## 2018-09-26 DIAGNOSIS — Z794 Long term (current) use of insulin: Secondary | ICD-10-CM | POA: Diagnosis not present

## 2018-09-26 DIAGNOSIS — E119 Type 2 diabetes mellitus without complications: Secondary | ICD-10-CM

## 2018-09-26 DIAGNOSIS — I10 Essential (primary) hypertension: Secondary | ICD-10-CM

## 2018-09-26 DIAGNOSIS — Z713 Dietary counseling and surveillance: Secondary | ICD-10-CM

## 2018-09-26 LAB — GLUCOSE, CAPILLARY: GLUCOSE-CAPILLARY: 116 mg/dL — AB (ref 70–99)

## 2018-09-26 LAB — POCT GLYCOSYLATED HEMOGLOBIN (HGB A1C): Hemoglobin A1C: 5.6 % (ref 4.0–5.6)

## 2018-09-26 NOTE — Patient Instructions (Signed)
Please record the time, amount and what food drinks and activities you have while wearing the continuous glucose monitor (CGM).  Bring the folder with you to follow up appointments. If your monitor falls off, please place it in the bag provided in your folder and bring it back with you to your next appointment.   Do not have a CT or an MRI while wearing the CGM.   1 week visit has been set up with me and a doctor for the first of two CGM downloads.   You will also return in 2 weeks to have your second download and the CGM removed.  Debera Lat, RD 09/26/2018 2:22 PM

## 2018-09-26 NOTE — Progress Notes (Signed)
CC: T2DM, HLD, foot pain  HPI:  Jesus Martin is a 58 y.o. male with PMH below.  Today we will address T2DM, HLD,foot pain.    Please see A&P for status of the patient's chronic medical conditions  Past Medical History:  Diagnosis Date  . Bipolar I disorder (HCC) 1991   Dr. Toy Care  . Borderline hyperlipidemia    history of  . Childhood asthma   . COPD (chronic obstructive pulmonary disease) ([place])   . Diabetes mellitus, new onset ([name]) 12/22/2017  . Elevated glucose   . Fibromyalgia   . GERD (gastroesophageal reflux disease)   . HTN (hypertension)   . Low vitamin B12 level 06/07/2018  . Lower back pain   . Paranoia ([place])   . Tobacco abuse   . Tobacco abuse   . Vitamin D deficiency    Review of Systems:  ROS: Pulmonary: pt denies increased work of breathing, shortness of breath,  Cardiac: pt denies palpitations, chest pain,  Abdominal: pt denies abdominal pain, nausea, vomiting, or diarrhea   Physical Exam:  Vitals:   09/26/18 1324  BP: (!) 150/88  Pulse: 88  Temp: 97.8 F (36.6 C)  TempSrc: Oral  SpO2: 97%  Weight: 269 lb 4.8 oz (122.2 kg)  Height: 6' 1"  (1.854 m)   Cardiac: normal rate and rhythm, clear s1 and s2, no murmurs, rubs or gallops Pulmonary: CTAB, not in distress Abdominal: non distended abdomen, soft and nontender Extremities: no LE edema Psych: Alert, conversant  Foot exam: Normal DP and PT pulses bilaterally.  Normal sensation bilaterally.  No ulcerations or breaks in the skin, minimal callus appreciated.  No gross bony abnormalities.  Pain elicited when pressure exerted superiorly on the lateral plantar surface of the midfoot bilaterally.   Social History   Socioeconomic History  . Marital status: Married    Spouse name: Not on file  . Number of children: Not on file  . Years of education: Not on file  . Highest education level: Not on file  Occupational History  . Not on file  Social Needs  . Financial resource strain: Not on  file  . Food insecurity:    Worry: Not on file    Inability: Not on file  . Transportation needs:    Medical: Not on file    Non-medical: Not on file  Tobacco Use  . Smoking status: Current Some Day Smoker    Packs/day: 0.30    Years: 45.00    Pack years: 13.50    Types: Cigarettes    Last attempt to quit: 07/10/2017    Years since quitting: 1.2  . Smokeless tobacco: Current User    Types: Snuff  . Tobacco comment: 2-3 pack per day . Cutting back  Substance and Sexual Activity  . Alcohol use: Yes    Comment: Whiskey.  . Drug use: No  . Sexual activity: Not on file  Lifestyle  . Physical activity:    Days per week: Not on file    Minutes per session: Not on file  . Stress: Not on file  Relationships  . Social connections:    Talks on phone: Not on file    Gets together: Not on file    Attends religious service: Not on file    Active member of club or organization: Not on file    Attends meetings of clubs or organizations: Not on file    Relationship status: Not on file  . Intimate partner violence:  Fear of current or ex partner: Not on file    Emotionally abused: Not on file    Physically abused: Not on file    Forced sexual activity: Not on file  Other Topics Concern  . Not on file  Social History Narrative   Lives with wife Jesus Martin (married 36 years) and son. Has 4 dogs and fish. Not employed. Woodworking for fun. HS graduate.       Quit smoking in 2008. 26 pack years. No recreational drug use. Drinks 1 glass of wine per month. Does not exercise.     Family History  Adopted: Yes    Assessment & Plan:   See Encounters Tab for problem based charting.  Patient discussed with Dr. Evette Doffing

## 2018-09-26 NOTE — Patient Instructions (Signed)
Mr. Haris we will refer you to a podiatrist to help evaluate the cause of your foot pain.  For now please buy some more supportive shoes as we discussed and rotate them daily.  We will check your cholesterol today we will send you the results.  Your diabetes is under good control we can continue to discuss relaxing your A1c goal a little bit.  We will place a CGM to help see how your sugars have been doing throughout the day and night.

## 2018-09-26 NOTE — Assessment & Plan Note (Signed)
Patient reports bilateral foot pain with standing and walking.  It is caused him difficulty in exercising and being able to walk with his wife.  He describes the pain as a dull aching that is worse with standing and walking it tends to be on the lateral portion of the plantar surface of bilateral feet.  He says the pain is much different from his regular neuropathy pain seems to be currently under good control.  On examination I did not see any calluses or ulcers.  He has good sensation and blood flow to bilateral feet.  When his obesity and his worn shoes I think he just needs to redistribute some of his weight to other portions of his feet this can be achieved with custom orthotics.  -We will refer patient to podiatry today possible fitting for diabetic shoes/orthotics -For now I asked patient to replace his current shoes as they are worn and to wear thick soled athletic shoes to alternate these daily for the time being.

## 2018-09-26 NOTE — Assessment & Plan Note (Signed)
Last visit I started pt on atorvastatin 34m for HLD in a patient with T2DM.    -repeat lipid panel today

## 2018-09-26 NOTE — Progress Notes (Signed)
Documentation for Freestyle Libre Pro Continuous glucose monitoring Freestyle Libre Pro CGM sensor placed today. Patient was educated about wearing sensor, keeping food, activity and medication log and when to call office. Patient was educated about how to care for the sensor and not to have an MRI, CT or Diathermy while wearing the sensor. Follow up was arranged with the patient for 1 week.   Lot #: [id number] Serial #: [id number] H Expiration Date: 01/08/2019  Debera Lat, RD 09/26/2018 2:38 PM.

## 2018-09-26 NOTE — Assessment & Plan Note (Addendum)
Initial blood pressure reading elevated however repeat was much better at 140/80.  Currently is only taking amlodipine 10 mg.  Urine creatinine albumin ratio has been normal.    -Continue amlodipine 10 mg -We will provide patient with a blood pressure cuff as he is very studious about bringing his blood sugar logs he can now provide me with some home blood pressure measurements.

## 2018-09-26 NOTE — Assessment & Plan Note (Signed)
Pt currently taking metformin 1084m BID and insulin 75/25 15 units BID.  As always patient brought in extensive and detailed blood sugar log with him today.  He was stressing about some of his sugars being over 200 but this is very occasional.  Majority of his blood sugars run 120-160.  His A1c today is 5.6.  I talked to him today about this being very good and not be hard on himself or to stress about individual numbers.  I introduced the idea of maybe cutting back some on his insulin but when he sees the higher numbers on his sugars this causes him some anxiety he was reticent to cut back for the time being.    -DButch Pennywould like to place a CGM today, which is okay with me however patient gives a very detailed log having seen no lows since I met him.   -We will continue his regimen above for now and I will broach the subject of backing off some on his insulin next time.

## 2018-09-26 NOTE — Assessment & Plan Note (Signed)
Pt requires refills on medications with associated diagnosis above.  Reviewed disease process and find this medication to be necessary, will not change dose or alter current therapy. 

## 2018-09-27 LAB — LIPID PANEL
Chol/HDL Ratio: 2.2 ratio (ref 0.0–5.0)
Cholesterol, Total: 110 mg/dL (ref 100–199)
HDL: 50 mg/dL (ref >39)
LDL Calculated: 36 mg/dL (ref 0–99)
Triglycerides: 118 mg/dL (ref 0–149)
VLDL Cholesterol Cal: 24 mg/dL (ref 5–40)

## 2018-09-27 NOTE — Progress Notes (Signed)
Internal Medicine Clinic Attending  Case discussed with Dr. Winfrey  at the time of the visit.  We reviewed the resident's history and exam and pertinent patient test results.  I agree with the assessment, diagnosis, and plan of care documented in the resident's note.  

## 2018-09-30 ENCOUNTER — Encounter: Payer: Self-pay | Admitting: Internal Medicine

## 2018-10-03 ENCOUNTER — Encounter: Payer: Self-pay | Admitting: Dietician

## 2018-10-03 ENCOUNTER — Other Ambulatory Visit: Payer: Self-pay

## 2018-10-03 ENCOUNTER — Ambulatory Visit: Payer: Medicare HMO | Admitting: Dietician

## 2018-10-03 ENCOUNTER — Ambulatory Visit (INDEPENDENT_AMBULATORY_CARE_PROVIDER_SITE_OTHER): Payer: Medicare HMO | Admitting: Internal Medicine

## 2018-10-03 ENCOUNTER — Encounter: Payer: Self-pay | Admitting: Internal Medicine

## 2018-10-03 VITALS — BP 129/82 | HR 88 | Temp 97.5°F | Ht 73.0 in | Wt 269.9 lb

## 2018-10-03 DIAGNOSIS — Z7951 Long term (current) use of inhaled steroids: Secondary | ICD-10-CM | POA: Diagnosis not present

## 2018-10-03 DIAGNOSIS — Z794 Long term (current) use of insulin: Secondary | ICD-10-CM | POA: Diagnosis not present

## 2018-10-03 DIAGNOSIS — J449 Chronic obstructive pulmonary disease, unspecified: Secondary | ICD-10-CM | POA: Diagnosis not present

## 2018-10-03 DIAGNOSIS — F17200 Nicotine dependence, unspecified, uncomplicated: Secondary | ICD-10-CM

## 2018-10-03 DIAGNOSIS — I10 Essential (primary) hypertension: Secondary | ICD-10-CM | POA: Diagnosis not present

## 2018-10-03 DIAGNOSIS — Z79899 Other long term (current) drug therapy: Secondary | ICD-10-CM

## 2018-10-03 DIAGNOSIS — E119 Type 2 diabetes mellitus without complications: Secondary | ICD-10-CM | POA: Diagnosis not present

## 2018-10-03 NOTE — Assessment & Plan Note (Signed)
Patient is here for blood pressure follow-up.  He is taking amlodipine 10 mg daily.  Blood pressure was 130/94 and 129/82 on recheck.  He has his home blood pressure log with him today (uploaded under media tab).  At home he has been running in the 791T-056P systolic.  He is following up again in 1 week for his last CGM download.  I have asked patient bring his home blood pressure cuff with him for calibration.  No medication changes today.

## 2018-10-03 NOTE — Progress Notes (Signed)
   CC: Diabetes follow up  HPI:  [name] is a 58 y.o. male with past medical history outlined below here for diabetes follow up. For the details of today's visit, please refer to the assessment and plan.  Past Medical History:  Diagnosis Date  . Bipolar I disorder (HCC) 1991   Dr. Toy Care  . Borderline hyperlipidemia    history of  . Childhood asthma   . COPD (chronic obstructive pulmonary disease) ([place])   . Diabetes mellitus, new onset ([place]) 12/22/2017  . Elevated glucose   . Fibromyalgia   . GERD (gastroesophageal reflux disease)   . HTN (hypertension)   . Low vitamin B12 level 06/07/2018  . Lower back pain   . Paranoia ([place])   . Tobacco abuse   . Tobacco abuse   . Vitamin D deficiency     Review of Systems  Respiratory: Negative for cough and sputum production.   Cardiovascular: Negative for chest pain.    Physical Exam:  Vitals:   10/03/18 0859 10/03/18 0920  BP: (!) 138/94 129/82  Pulse: 90 88  Temp: (!) 97.5 F (36.4 C)   TempSrc: Oral   SpO2: 96%   Weight: 269 lb 14.4 oz (122.4 kg)   Height: 6' 1"  (1.854 m)     Constitutional: NAD, appears comfortable Cardiovascular: RRR, no m/r/g Pulmonary/Chest: Bilateral inspiratory crackles, no wheezing, no increased work of breathing  Extremities: Warm and well perfused. No edema.  Psychiatric: Normal mood and affect  Assessment & Plan:   See Encounters Tab for problem based charting.  Patient discussed with Dr. Eppie Gibson

## 2018-10-03 NOTE — Patient Instructions (Addendum)
Great job keeping food records! Please contiue to write down what you eat and drink for the next week/   Your blood sugars are very well controlled.   Suggestions for improved blood sugars-  1- Please inject your insulin 15-30 minutes before breakfast and dinner.  2- Try this experiment this week- eat cheerios instead of Fruity Pebbles  See you next week. We'll remove the sensor then.  Butch Penny [phone]

## 2018-10-03 NOTE — Patient Instructions (Signed)
Mr. Schutter,  It was a pleasure to meet you. Please continue to take all of your medication as previously prescribed. Please bring your blood pressure cuff with you at your next follow up appointment.   If you have any questions or concerns, call our clinic at [phone] or after hours call [phone] and ask for the internal medicine resident on call. Thank you!  Dr. Philipp Ovens

## 2018-10-03 NOTE — Progress Notes (Signed)
Diabetes Self-Management Education  Visit Type: First/Initial  Appt. Start Time: 8:17 am   Appt. End Time: 8:47 am  10/03/2018  [name], identified by name and date of birth, is a 58 y.o. male with a diagnosis of Diabetes: Type 2.   ASSESSMENT  Downloaded CGM Week 1 results which showed majority of blood sugars well-controlled.   CGM Results from Download #1 Average is  132  for 7 days Time in range (70-180 mg/dL): 90% (Goal >70%) Time above range (>180 mg/dL) 10% (Less than 25%) Time below range (<70 mg/dL): 0% (Goal is <4%) Coefficient of variation: 26.4% (Goal is <36%) Glucose Management Indicator: 6.5%  Patient reports he is taking his insulin around 9 am and 9 pm. He reports his insulin is 70/30 and able to say what that means. He reports he eats breakfast between 9:45 and 11:45, he eats dinner at 6 pm and a snack around 8-9 pm. Counseled on injecting insulin closer to mealtimes to provide greater stability of blood sugars and prevent low blood sugars. Reviewed food record for causes of hyperglycemia which was infrequent.    15 minutes spent in Diabetes Self Management Training   Diabetes Self-Management Education - 10/03/18 0900      Visit Information   Visit Type  First/Initial      Initial Visit   Diabetes Type  Type 2    Are you taking your medications as prescribed?  Yes   not timing insulin with meals     Pre-Education Assessment   Patient understands incorporating nutritional management into lifestyle.  Needs Review    Patient understands using medications safely.  Needs Review    Patient understands monitoring blood glucose, interpreting and using results  Needs Review      Complications   Last HgB A1C per patient/outside source  --   5.6     Dietary Intake   Breakfast  9:45-11:45, varies    Dinner  6:00 pm, philly cheese steak sub, hamburger, french fries    Snack (evening)  8-9 pm, chips, peanut m&ms    Beverage(s)  drinks sugar-free  beverages      Patient Education   Nutrition management   Meal timing in regards to the patients' current diabetes medication.    Medications  Reviewed patients medication for diabetes, action, purpose, timing of dose and side effects.    Monitoring  Other (comment)   CGM report      Individualized Goals (developed by patient)   Medications  take my medication as prescribed   time insulin closer to meals     Outcomes   Expected Outcomes  Demonstrated interest in learning. Expect positive outcomes    Future DMSE  Other (comment)   1 week   Program Status  Not Completed   need to complete assessment at follow-up, ran out of time      Individualized Plan for Diabetes Self-Management Training:   Learning Objective:  Patient will have a greater understanding of diabetes self-management. Patient education plan is to attend individual and/or group sessions per assessed needs and concerns.   Plan:   Patient Instructions  Doristine Devoid job keeping food records! Please contiue to write down what you eat and drink for the next week/   Your blood sugars are very well controlled.   Suggestions for improved blood sugars-  1- Please inject your insulin 15-30 minutes before breakfast and dinner.  2- Try this experiment this week- eat cheerios instead of Fruity Pebbles  See  you next week. We'll remove the sensor then.  Butch Penny [phone]   Expected Outcomes:  Demonstrated interest in learning. Expect positive outcomes  Education material provided:  After visit summary  If problems or questions, patient to contact team via:  Phone   Future DSME appointment: Other (comment)(1 week)    Debera Lat, RD 10/03/2018 9:46 AM.

## 2018-10-03 NOTE — Assessment & Plan Note (Signed)
Patient is here for diabetes follow-up.  He is currently taking metformin 1000 mg twice daily and insulin 75/25 mix 15 units twice daily.  He is here for his for CGM download.  He wore the device for 7 days.  His average CBG was 132.  He was in target range 90% of the time with only one hypoglycemic event. Last A1c checked 1 week ago was 5.6.  Overall excellent glycemic control.  No medication changes today.  Patient has been taking his insulin after meals.  Only change today was to time his twice daily insulin prior to meals.  He will follow-up again in 1 week.

## 2018-10-03 NOTE — Assessment & Plan Note (Addendum)
Patient has a history of COPD on Trelegy.  He is a current smoker. Has been smoking for 45 years.  On exam today he has diffuse inspiratory crackles but no wheezing.  He denies any symptoms of cough or shortness of breath.  Advised smoking cessation.

## 2018-10-04 ENCOUNTER — Ambulatory Visit (INDEPENDENT_AMBULATORY_CARE_PROVIDER_SITE_OTHER): Payer: Medicare HMO

## 2018-10-04 ENCOUNTER — Encounter: Payer: Self-pay | Admitting: Podiatry

## 2018-10-04 ENCOUNTER — Ambulatory Visit: Payer: Medicare HMO | Admitting: Podiatry

## 2018-10-04 VITALS — BP 104/76 | HR 81 | Resp 14

## 2018-10-04 DIAGNOSIS — M722 Plantar fascial fibromatosis: Secondary | ICD-10-CM | POA: Diagnosis not present

## 2018-10-04 DIAGNOSIS — M779 Enthesopathy, unspecified: Secondary | ICD-10-CM | POA: Diagnosis not present

## 2018-10-04 DIAGNOSIS — G629 Polyneuropathy, unspecified: Secondary | ICD-10-CM

## 2018-10-04 DIAGNOSIS — E1149 Type 2 diabetes mellitus with other diabetic neurological complication: Secondary | ICD-10-CM

## 2018-10-04 MED ORDER — TRIAMCINOLONE ACETONIDE 10 MG/ML IJ SUSP
10.0000 mg | Freq: Once | INTRAMUSCULAR | Status: AC
  Administered 2018-10-04: 10 mg

## 2018-10-04 NOTE — Progress Notes (Signed)
   Subjective:    Patient ID: Jesus Martin, male    DOB: 1960-12-22, 58 y.o.   MRN: [id number]  HPI 58 year old male presents the office today for concerns of bilateral foot pain which is been ongoing for several years but over the last year his foot pain is worsened.  He gets pain to the bottom of his heel as well as going up the side of his ankle.  He has had no recent treatment for this.  He has a history of left foot  surgery about 12 years ago.  He states that he fractured his metatarsal due to a fall.  He is some occasional swelling to his feet.  Also he is noticed some burning sensation to his feet.  He was diagnosed with neuropathy and is currently on Lyrica for this.  He denies any open sores.  Has no other concerns today.  Reports his last A1c of 5.1.   Review of Systems  All other systems reviewed and are negative.      Objective:   Physical Exam  General: AAO x3, NAD  Dermatological: Skin is warm, dry and supple bilateral. Nails x 10 are well manicured; remaining integument appears unremarkable at this time. There are no open sores, no preulcerative lesions, no rash or signs of infection present.  Vascular: Dorsalis Pedis artery and Posterior Tibial artery pedal pulses are 2/4 bilateral with immedate capillary fill time. There is no pain with calf compression, swelling, warmth, erythema.   Neruologic: Sensation decreased with Semmes Weinstein monofilament.  Musculoskeletal: There is tenderness palpation on the plantar medial tubercle of the calcaneus insertion plantar fascia bilaterally.  Plantar fascia appears to be intact.  This appears to be where he has majority of his symptoms.  There is no pain with lateral compression of calcaneus.  No pain in the Achilles tendon.  Mild discomfort in the course the peroneal tendons bilaterally.  Overall the left foot is worse than the right for both issues.  Muscular strength 5/5 in all groups tested bilateral.  Gait: Unassisted,  Nonantalgic.     Assessment & Plan:  58 year old male with bilateral chronic foot pain likely result of peroneal tendinitis, plantar fasciitis with neuropathy -Treatment options discussed including all alternatives, risks, and complications -Etiology of symptoms were discussed -X-rays were obtained and reviewed with the patient.  No evidence of acute fracture or stress fracture.  Inferior calcaneal spurring is present in the left side. -Steroid injection is going to bilateral heels.  See procedure note below. -Dispensed plantar fascial brace. -Prescribed Voltaren gel. -We discussed shoe modifications and orthotics.  We will check insurance coverage and have him follow-up for inserts.  Given his diabetes with neuropathy as well as chronic foot pain I think to be helpful for him. -Continue Lyrica for neuropathy.  Procedure: Injection Tendon/Ligament Discussed alternatives, risks, complications and verbal consent was obtained.  Location: Bilateral plantar fascia at the glabrous junction; medial approach. Skin Prep: Alcohol Injectate: 0.5cc 0.5% marcaine plain, 0.5 cc 2% lidocaine plain and, 1 cc kenalog 10. Disposition: Patient tolerated procedure well. Injection site dressed with a band-aid.  Post-injection care was discussed and return precautions discussed.   Trula Slade DPM

## 2018-10-04 NOTE — Patient Instructions (Signed)

## 2018-10-04 NOTE — Progress Notes (Signed)
Patient ID: Jesus Martin, male   DOB: 08-19-1960, 58 y.o.   MRN: [id number]  Case discussed with Dr. Philipp Ovens at the time of the visit.  We reviewed the resident's history and exam and pertinent patient test results.  I agree with the assessment, diagnosis and plan of care documented in the resident's note.

## 2018-10-05 MED ORDER — DICLOFENAC SODIUM 1 % TD GEL: 2.0000 g | Freq: Four times a day (QID) | TRANSDERMAL | 0 refills | Status: DC

## 2018-10-06 ENCOUNTER — Other Ambulatory Visit: Payer: Self-pay | Admitting: Podiatry

## 2018-10-06 DIAGNOSIS — M722 Plantar fascial fibromatosis: Secondary | ICD-10-CM

## 2018-10-07 ENCOUNTER — Inpatient Hospital Stay (HOSPITAL_COMMUNITY)
Admission: EM | Admit: 2018-10-07 | Discharge: 2018-10-12 | DRG: 194 | Disposition: A | Discharge status: Home / Self Care | Payer: Medicare HMO | Attending: Oncology | Admitting: Oncology

## 2018-10-07 ENCOUNTER — Other Ambulatory Visit: Payer: Self-pay

## 2018-10-07 ENCOUNTER — Encounter (HOSPITAL_COMMUNITY): Payer: Self-pay | Admitting: Emergency Medicine

## 2018-10-07 ENCOUNTER — Emergency Department (HOSPITAL_COMMUNITY): Payer: Medicare HMO

## 2018-10-07 DIAGNOSIS — E1165 Type 2 diabetes mellitus with hyperglycemia: Secondary | ICD-10-CM | POA: Diagnosis present

## 2018-10-07 DIAGNOSIS — R000 Tachycardia, unspecified: Secondary | ICD-10-CM | POA: Diagnosis not present

## 2018-10-07 DIAGNOSIS — Z91038 Other insect allergy status: Secondary | ICD-10-CM | POA: Diagnosis not present

## 2018-10-07 DIAGNOSIS — D72829 Elevated white blood cell count, unspecified: Secondary | ICD-10-CM

## 2018-10-07 DIAGNOSIS — Z7951 Long term (current) use of inhaled steroids: Secondary | ICD-10-CM

## 2018-10-07 DIAGNOSIS — F17210 Nicotine dependence, cigarettes, uncomplicated: Secondary | ICD-10-CM | POA: Diagnosis present

## 2018-10-07 DIAGNOSIS — E1140 Type 2 diabetes mellitus with diabetic neuropathy, unspecified: Secondary | ICD-10-CM | POA: Diagnosis not present

## 2018-10-07 DIAGNOSIS — K760 Fatty (change of) liver, not elsewhere classified: Secondary | ICD-10-CM | POA: Diagnosis present

## 2018-10-07 DIAGNOSIS — Z888 Allergy status to other drugs, medicaments and biological substances status: Secondary | ICD-10-CM | POA: Diagnosis not present

## 2018-10-07 DIAGNOSIS — R05 Cough: Secondary | ICD-10-CM | POA: Diagnosis not present

## 2018-10-07 DIAGNOSIS — E876 Hypokalemia: Secondary | ICD-10-CM

## 2018-10-07 DIAGNOSIS — F319 Bipolar disorder, unspecified: Secondary | ICD-10-CM | POA: Diagnosis not present

## 2018-10-07 DIAGNOSIS — J1108 Influenza due to unidentified influenza virus with specified pneumonia: Secondary | ICD-10-CM | POA: Diagnosis not present

## 2018-10-07 DIAGNOSIS — J1008 Influenza due to other identified influenza virus with other specified pneumonia: Secondary | ICD-10-CM | POA: Diagnosis present

## 2018-10-07 DIAGNOSIS — R0902 Hypoxemia: Secondary | ICD-10-CM | POA: Diagnosis present

## 2018-10-07 DIAGNOSIS — J449 Chronic obstructive pulmonary disease, unspecified: Secondary | ICD-10-CM

## 2018-10-07 DIAGNOSIS — J441 Chronic obstructive pulmonary disease with (acute) exacerbation: Secondary | ICD-10-CM | POA: Diagnosis not present

## 2018-10-07 DIAGNOSIS — Z79899 Other long term (current) drug therapy: Secondary | ICD-10-CM | POA: Diagnosis not present

## 2018-10-07 DIAGNOSIS — I10 Essential (primary) hypertension: Secondary | ICD-10-CM | POA: Diagnosis present

## 2018-10-07 DIAGNOSIS — Z794 Long term (current) use of insulin: Secondary | ICD-10-CM

## 2018-10-07 DIAGNOSIS — A400 Sepsis due to streptococcus, group A: Secondary | ICD-10-CM

## 2018-10-07 DIAGNOSIS — Z91030 Bee allergy status: Secondary | ICD-10-CM

## 2018-10-07 DIAGNOSIS — G8929 Other chronic pain: Secondary | ICD-10-CM | POA: Diagnosis present

## 2018-10-07 DIAGNOSIS — J9601 Acute respiratory failure with hypoxia: Secondary | ICD-10-CM

## 2018-10-07 DIAGNOSIS — K219 Gastro-esophageal reflux disease without esophagitis: Secondary | ICD-10-CM | POA: Diagnosis present

## 2018-10-07 DIAGNOSIS — E871 Hypo-osmolality and hyponatremia: Secondary | ICD-10-CM | POA: Diagnosis present

## 2018-10-07 DIAGNOSIS — B950 Streptococcus, group A, as the cause of diseases classified elsewhere: Secondary | ICD-10-CM | POA: Diagnosis not present

## 2018-10-07 DIAGNOSIS — J189 Pneumonia, unspecified organism: Secondary | ICD-10-CM

## 2018-10-07 DIAGNOSIS — R7881 Bacteremia: Secondary | ICD-10-CM | POA: Diagnosis present

## 2018-10-07 DIAGNOSIS — J154 Pneumonia due to other streptococci: Principal | ICD-10-CM | POA: Diagnosis present

## 2018-10-07 DIAGNOSIS — F3170 Bipolar disorder, currently in remission, most recent episode unspecified: Secondary | ICD-10-CM

## 2018-10-07 DIAGNOSIS — Z720 Tobacco use: Secondary | ICD-10-CM | POA: Diagnosis not present

## 2018-10-07 DIAGNOSIS — J101 Influenza due to other identified influenza virus with other respiratory manifestations: Secondary | ICD-10-CM | POA: Diagnosis not present

## 2018-10-07 DIAGNOSIS — M797 Fibromyalgia: Secondary | ICD-10-CM | POA: Diagnosis present

## 2018-10-07 DIAGNOSIS — E785 Hyperlipidemia, unspecified: Secondary | ICD-10-CM | POA: Diagnosis not present

## 2018-10-07 DIAGNOSIS — F05 Delirium due to known physiological condition: Secondary | ICD-10-CM | POA: Diagnosis not present

## 2018-10-07 DIAGNOSIS — Z79891 Long term (current) use of opiate analgesic: Secondary | ICD-10-CM | POA: Diagnosis not present

## 2018-10-07 DIAGNOSIS — J09X1 Influenza due to identified novel influenza A virus with pneumonia: Secondary | ICD-10-CM | POA: Diagnosis not present

## 2018-10-07 DIAGNOSIS — R0602 Shortness of breath: Secondary | ICD-10-CM | POA: Diagnosis not present

## 2018-10-07 LAB — COMPREHENSIVE METABOLIC PANEL
ALT: 44 U/L (ref 0–44)
AST: 42 U/L — ABNORMAL HIGH (ref 15–41)
Albumin: 3.1 g/dL — ABNORMAL LOW (ref 3.5–5.0)
Alkaline Phosphatase: 71 U/L (ref 38–126)
Anion gap: 11 (ref 5–15)
BUN: 14 mg/dL (ref 6–20)
CO2: 25 mmol/L (ref 22–32)
Calcium: 9.7 mg/dL (ref 8.9–10.3)
Chloride: 95 mmol/L — ABNORMAL LOW (ref 98–111)
Creatinine, Ser: 1.26 mg/dL — ABNORMAL HIGH (ref 0.61–1.24)
GFR calc Af Amer: >60 mL/min (ref >60)
GFR calc non Af Amer: >60 mL/min (ref >60)
Glucose, Bld: 227 mg/dL — ABNORMAL HIGH (ref 70–99)
Potassium: 2.6 mmol/L — CL (ref 3.5–5.1)
Sodium: 131 mmol/L — ABNORMAL LOW (ref 135–145)
Total Bilirubin: 0.8 mg/dL (ref 0.3–1.2)
Total Protein: 6.6 g/dL (ref 6.5–8.1)

## 2018-10-07 LAB — URINALYSIS, ROUTINE W REFLEX MICROSCOPIC
Bacteria, UA: NONE SEEN
Bilirubin Urine: NEGATIVE
Glucose, UA: NEGATIVE mg/dL
Ketones, ur: NEGATIVE mg/dL
Leukocytes,Ua: NEGATIVE
Nitrite: NEGATIVE
Protein, ur: 30 mg/dL — AB
Specific Gravity, Urine: 1.015 (ref 1.005–1.030)
pH: 6 (ref 5.0–8.0)

## 2018-10-07 LAB — MRSA PCR SCREENING: MRSA by PCR: NEGATIVE

## 2018-10-07 LAB — CBC WITH DIFFERENTIAL/PLATELET
Abs Immature Granulocytes: 0.11 10*3/uL — ABNORMAL HIGH (ref 0.00–0.07)
Abs Immature Granulocytes: 0 10*3/uL (ref 0.00–0.07)
Basophils Absolute: 0 10*3/uL (ref 0.0–0.1)
Basophils Absolute: 0.1 10*3/uL (ref 0.0–0.1)
Basophils Relative: 0 %
Basophils Relative: 1 %
Eosinophils Absolute: 0.1 10*3/uL (ref 0.0–0.5)
Eosinophils Absolute: 0 10*3/uL (ref 0.0–0.5)
Eosinophils Relative: 1 %
Eosinophils Relative: 0 %
HCT: 37.6 % — ABNORMAL LOW (ref 39.0–52.0)
HCT: 35.1 % — ABNORMAL LOW (ref 39.0–52.0)
HEMOGLOBIN: 11.5 g/dL — AB (ref 13.0–17.0)
Hemoglobin: 12.1 g/dL — ABNORMAL LOW (ref 13.0–17.0)
Immature Granulocytes: 1 %
Lymphocytes Relative: 8 %
Lymphocytes Relative: 3 %
Lymphs Abs: 1.2 10*3/uL (ref 0.7–4.0)
Lymphs Abs: 0.4 10*3/uL — ABNORMAL LOW (ref 0.7–4.0)
MCH: 29.8 pg (ref 26.0–34.0)
MCH: 30.7 pg (ref 26.0–34.0)
MCHC: 32.2 g/dL (ref 30.0–36.0)
MCHC: 32.8 g/dL (ref 30.0–36.0)
MCV: 92.6 fL (ref 80.0–100.0)
MCV: 93.6 fL (ref 80.0–100.0)
Monocytes Absolute: 1.3 10*3/uL — ABNORMAL HIGH (ref 0.1–1.0)
Monocytes Absolute: 0.6 10*3/uL (ref 0.1–1.0)
Monocytes Relative: 8 %
Monocytes Relative: 4 %
Neutro Abs: 12.7 10*3/uL — ABNORMAL HIGH (ref 1.7–7.7)
Neutro Abs: 13.1 10*3/uL — ABNORMAL HIGH (ref 1.7–7.7)
Neutrophils Relative %: 82 %
Neutrophils Relative %: 92 %
Platelets: 155 10*3/uL (ref 150–400)
Platelets: 145 10*3/uL — ABNORMAL LOW (ref 150–400)
RBC: 4.06 MIL/uL — ABNORMAL LOW (ref 4.22–5.81)
RBC: 3.75 MIL/uL — ABNORMAL LOW (ref 4.22–5.81)
RDW: 12.8 % (ref 11.5–15.5)
RDW: 13 % (ref 11.5–15.5)
WBC Morphology: INCREASED
WBC Morphology: INCREASED
WBC: 15.3 10*3/uL — ABNORMAL HIGH (ref 4.0–10.5)
WBC: 14.2 10*3/uL — ABNORMAL HIGH (ref 4.0–10.5)
nRBC: 0 % (ref 0.0–0.2)
nRBC: 0 % (ref 0.0–0.2)
nRBC: 0 /100 WBC

## 2018-10-07 LAB — CBG MONITORING, ED: GLUCOSE-CAPILLARY: 212 mg/dL — AB (ref 70–99)

## 2018-10-07 LAB — INFLUENZA PANEL BY PCR (TYPE A & B)
Influenza A By PCR: POSITIVE — AB
Influenza B By PCR: NEGATIVE

## 2018-10-07 LAB — LACTIC ACID, PLASMA
Lactic Acid, Venous: 2 mmol/L (ref 0.5–1.9)
Lactic Acid, Venous: 2.1 mmol/L (ref 0.5–1.9)

## 2018-10-07 LAB — GLUCOSE, CAPILLARY: Glucose-Capillary: 251 mg/dL — ABNORMAL HIGH (ref 70–99)

## 2018-10-07 MED ORDER — INSULIN ASPART PROT & ASPART (70-30 MIX) 100 UNIT/ML ~~LOC~~ SUSP
15.0000 [IU] | Freq: Two times a day (BID) | SUBCUTANEOUS | Status: DC
  Administered 2018-10-08 – 2018-10-12 (×9): 15 [IU] via SUBCUTANEOUS
  Filled 2018-10-07: qty 10

## 2018-10-07 MED ORDER — POTASSIUM CHLORIDE 10 MEQ/100ML IV SOLN
10.0000 meq | INTRAVENOUS | Status: AC
  Administered 2018-10-07 (×3): 10 meq via INTRAVENOUS
  Filled 2018-10-07 (×3): qty 100

## 2018-10-07 MED ORDER — LITHIUM CARBONATE ER 450 MG PO TBCR
450.0000 mg | EXTENDED_RELEASE_TABLET | Freq: Every day | ORAL | Status: DC
  Administered 2018-10-08 – 2018-10-12 (×5): 450 mg via ORAL
  Filled 2018-10-07 (×5): qty 1

## 2018-10-07 MED ORDER — SODIUM CHLORIDE 0.9 % IV BOLUS
30.0000 mL/kg | Freq: Once | INTRAVENOUS | Status: AC
  Administered 2018-10-07: 3660 mL via INTRAVENOUS

## 2018-10-07 MED ORDER — TRAMADOL HCL 50 MG PO TABS
50.0000 mg | ORAL_TABLET | Freq: Two times a day (BID) | ORAL | Status: DC | PRN
  Administered 2018-10-07: 50 mg via ORAL
  Filled 2018-10-07: qty 1

## 2018-10-07 MED ORDER — IPRATROPIUM-ALBUTEROL 0.5-2.5 (3) MG/3ML IN SOLN
3.0000 mL | Freq: Four times a day (QID) | RESPIRATORY_TRACT | Status: DC
  Administered 2018-10-07 – 2018-10-08 (×4): 3 mL via RESPIRATORY_TRACT
  Filled 2018-10-07 (×3): qty 3

## 2018-10-07 MED ORDER — QUETIAPINE FUMARATE 200 MG PO TABS
200.0000 mg | ORAL_TABLET | Freq: Every day | ORAL | Status: DC
  Administered 2018-10-07 – 2018-10-11 (×5): 200 mg via ORAL
  Filled 2018-10-07 (×6): qty 1

## 2018-10-07 MED ORDER — ENOXAPARIN SODIUM 60 MG/0.6ML ~~LOC~~ SOLN
60.0000 mg | SUBCUTANEOUS | Status: DC
  Administered 2018-10-07: 60 mg via SUBCUTANEOUS
  Filled 2018-10-07: qty 0.6

## 2018-10-07 MED ORDER — OSELTAMIVIR PHOSPHATE 75 MG PO CAPS
75.0000 mg | ORAL_CAPSULE | Freq: Every day | ORAL | Status: AC
  Administered 2018-10-07 – 2018-10-11 (×5): 75 mg via ORAL
  Filled 2018-10-07 (×5): qty 1

## 2018-10-07 MED ORDER — PREDNISONE 20 MG PO TABS
40.0000 mg | ORAL_TABLET | Freq: Every day | ORAL | Status: DC
  Administered 2018-10-08: 40 mg via ORAL
  Filled 2018-10-07: qty 2

## 2018-10-07 MED ORDER — METHYLPREDNISOLONE SODIUM SUCC 125 MG IJ SOLR
125.0000 mg | Freq: Once | INTRAMUSCULAR | Status: AC
  Administered 2018-10-07: 125 mg via INTRAVENOUS
  Filled 2018-10-07: qty 2

## 2018-10-07 MED ORDER — LAMOTRIGINE 150 MG PO TABS
150.0000 mg | ORAL_TABLET | Freq: Two times a day (BID) | ORAL | Status: DC
  Administered 2018-10-07 – 2018-10-12 (×10): 150 mg via ORAL
  Filled 2018-10-07 (×11): qty 1

## 2018-10-07 MED ORDER — SODIUM CHLORIDE 0.9 % IV SOLN
1.0000 g | Freq: Once | INTRAVENOUS | Status: AC
  Administered 2018-10-07: 1 g via INTRAVENOUS
  Filled 2018-10-07: qty 10

## 2018-10-07 MED ORDER — SODIUM CHLORIDE 0.9 % IV SOLN
500.0000 mg | Freq: Once | INTRAVENOUS | Status: AC
  Administered 2018-10-07: 500 mg via INTRAVENOUS
  Filled 2018-10-07: qty 500

## 2018-10-07 MED ORDER — PREGABALIN 25 MG PO CAPS
150.0000 mg | ORAL_CAPSULE | Freq: Two times a day (BID) | ORAL | Status: DC
  Administered 2018-10-07 – 2018-10-12 (×10): 150 mg via ORAL
  Filled 2018-10-07 (×10): qty 1

## 2018-10-07 MED ORDER — ALBUTEROL (5 MG/ML) CONTINUOUS INHALATION SOLN
15.0000 mg/h | INHALATION_SOLUTION | RESPIRATORY_TRACT | Status: AC
  Administered 2018-10-07: 15 mg/h via RESPIRATORY_TRACT
  Filled 2018-10-07: qty 20

## 2018-10-07 MED ORDER — ACETAMINOPHEN 325 MG PO TABS
325.0000 mg | ORAL_TABLET | Freq: Four times a day (QID) | ORAL | Status: DC | PRN
  Administered 2018-10-09 – 2018-10-12 (×4): 650 mg via ORAL
  Filled 2018-10-07 (×3): qty 2

## 2018-10-07 MED ORDER — ATORVASTATIN CALCIUM 40 MG PO TABS
40.0000 mg | ORAL_TABLET | Freq: Every day | ORAL | Status: DC
  Administered 2018-10-07 – 2018-10-11 (×5): 40 mg via ORAL
  Filled 2018-10-07 (×6): qty 1

## 2018-10-07 MED ORDER — DEXTROSE 5 % IV SOLN
250.0000 mg | INTRAVENOUS | Status: DC
  Administered 2018-10-08: 250 mg via INTRAVENOUS
  Filled 2018-10-07 (×2): qty 250

## 2018-10-07 MED ORDER — FLUTICASONE-UMECLIDIN-VILANT 100-62.5-25 MCG/INH IN AEPB: 1.0000 | INHALATION_SPRAY | Freq: Every day | RESPIRATORY_TRACT | Status: DC

## 2018-10-07 MED ORDER — SODIUM CHLORIDE 0.9 % IV SOLN: 1.0000 g | INTRAVENOUS | Status: DC

## 2018-10-07 MED ORDER — FLUTICASONE FUROATE-VILANTEROL 100-25 MCG/INH IN AEPB
1.0000 | INHALATION_SPRAY | Freq: Every day | RESPIRATORY_TRACT | Status: DC
  Administered 2018-10-08 – 2018-10-12 (×5): 1 via RESPIRATORY_TRACT
  Filled 2018-10-07: qty 28

## 2018-10-07 MED ORDER — ALBUTEROL (5 MG/ML) CONTINUOUS INHALATION SOLN
15.0000 mg/h | INHALATION_SOLUTION | RESPIRATORY_TRACT | Status: DC
  Administered 2018-10-07: 15 mg/h via RESPIRATORY_TRACT

## 2018-10-07 MED ORDER — PANTOPRAZOLE SODIUM 40 MG PO TBEC
40.0000 mg | DELAYED_RELEASE_TABLET | Freq: Every day | ORAL | Status: DC
  Administered 2018-10-08 – 2018-10-12 (×5): 40 mg via ORAL
  Filled 2018-10-07 (×5): qty 1

## 2018-10-07 MED ORDER — INSULIN ASPART 100 UNIT/ML ~~LOC~~ SOLN
0.0000 [IU] | Freq: Three times a day (TID) | SUBCUTANEOUS | Status: DC
  Administered 2018-10-07: 2 [IU] via SUBCUTANEOUS
  Administered 2018-10-08 (×2): 3 [IU] via SUBCUTANEOUS
  Administered 2018-10-08 – 2018-10-09 (×4): 2 [IU] via SUBCUTANEOUS
  Administered 2018-10-10: 1 [IU] via SUBCUTANEOUS

## 2018-10-07 MED ORDER — POTASSIUM CHLORIDE CRYS ER 20 MEQ PO TBCR
40.0000 meq | EXTENDED_RELEASE_TABLET | Freq: Four times a day (QID) | ORAL | Status: AC
  Administered 2018-10-07 (×2): 40 meq via ORAL
  Filled 2018-10-07 (×2): qty 2

## 2018-10-07 MED ORDER — LITHIUM CARBONATE ER 450 MG PO TBCR
675.0000 mg | EXTENDED_RELEASE_TABLET | Freq: Every day | ORAL | Status: DC
  Administered 2018-10-07 – 2018-10-11 (×5): 675 mg via ORAL
  Filled 2018-10-07 (×6): qty 1.5

## 2018-10-07 MED ORDER — LITHIUM CARBONATE ER 450 MG PO TBCR: 450.0000 mg | EXTENDED_RELEASE_TABLET | ORAL | Status: DC

## 2018-10-07 NOTE — ED Provider Notes (Signed)
[facility] EMERGENCY DEPARTMENT Provider Note   CSN: [id number] Arrival date & time: 10/07/18  1143    History   Chief Complaint Chief Complaint  Patient presents with  . Respiratory Distress  . Sore Throat    HPI Jesus Martin is a 58 y.o. male.     HPI Patient presents to the emergency department with cough, shortness of breath, fever, body aches and generalized weakness.  The wife states he is also been adamant somewhat over the last 3 to 4 days.  Patient states that he feels like his breathing is more labored than normal.  He states he does have a history of COPD.  The patient denies chest pain,headache,blurred vision, neck pain, weakness, numbness, dizziness, anorexia, edema, abdominal pain, nausea, vomiting, diarrhea, rash, back pain, dysuria, hematemesis, bloody stool, near syncope, or syncope. Past Medical History:  Diagnosis Date  . Bipolar I disorder (HCC) 1991   Dr. Toy Care  . Borderline hyperlipidemia    history of  . Childhood asthma   . COPD (chronic obstructive pulmonary disease) ([place])   . Diabetes mellitus, new onset ([place]) 12/22/2017  . Elevated glucose   . Fibromyalgia   . GERD (gastroesophageal reflux disease)   . HTN (hypertension)   . Low vitamin B12 level 06/07/2018  . Lower back pain   . Paranoia ([place])   . Tobacco abuse   . Tobacco abuse   . Vitamin D deficiency     Patient Active Problem List   Diagnosis Date Noted  . Hyperlipidemia 09/26/2018  . Foot pain, bilateral 09/26/2018  . Diabetic neuropathy ([place]) 12/22/2017  . Chronic pain 11/24/2017  . Hepatic steatosis 11/24/2017  . COPD (chronic obstructive pulmonary disease) ([place]) 11/24/2017  . Diabetes ([place])   . Bipolar 1 disorder ([place]) 04/17/2011  . Hypertension 04/17/2011  . Former smoker 04/17/2011    Past Surgical History:  Procedure Laterality Date  . APPENDECTOMY     1975  . BACK SURGERY     1998  . NOSE SURGERY          Home Medications    Prior to  Admission medications   Medication Sig Start Date End Date Taking? Authorizing Provider  ACCU-CHEK FASTCLIX LANCETS MISC Check blood sugar 3 times a day. 08/17/18   Katherine Roan, MD  acetaminophen (TYLENOL) 325 MG tablet Take 325-650 mg by mouth every 6 (six) hours as needed (for pain or headaches).    [provider]  ALPRAZolam Duanne Moron) 1 MG tablet Take 2 mg by mouth every morning.    [provider]  alprazolam Duanne Moron) 2 MG tablet Take 2 mg by mouth 2 (two) times daily as needed. 09/17/18   [provider]  amLODipine (NORVASC) 10 MG tablet TAKE 1 TABLET BY MOUTH ONCE DAILY 03/11/18   Katherine Roan, MD  atorvastatin (LIPITOR) 40 MG tablet Take 1 tablet (40 mg total) by mouth daily at 6 PM. 03/31/18   Shan Levans Jenne Pane, MD  blood glucose meter kit and supplies KIT Dispense based on patient and insurance preference. Use up to four times daily as directed. (FOR ICD-9 250.00, 250.01). 09/28/17   Nita Sells, MD  Blood Glucose Monitoring Suppl (ACCU-CHEK GUIDE) w/Device KIT 1 each by Does not apply route 3 (three) times daily. 08/17/18   Katherine Roan, MD  Cholecalciferol (VITAMIN D-3) 1000 units CAPS Take 1,000 Units by mouth daily.    [provider]  diclofenac sodium (VOLTAREN) 1 % GEL Apply 2 g  topically 4 (four) times daily. 10/05/18   Trula Slade, DPM  diphenhydrAMINE (BENADRYL) 25 mg capsule Take 25 mg by mouth every 8 (eight) hours as needed for allergies.    [provider]  EPINEPHrine (EPIPEN 2-PAK) 0.3 mg/0.3 mL IJ SOAJ injection Inject 0.3 mg into the muscle once as needed (for anaphylactic reaction to bee stings).    [provider]  Fluticasone-Umeclidin-Vilant (TRELEGY ELLIPTA) 100-62.5-25 MCG/INH AEPB Inhale 1 puff into the lungs daily. 04/21/18   Aldine Contes, MD  glucose blood (ACCU-CHEK GUIDE) test strip Check blood sugar 3 times a day. 08/17/18   Katherine Roan, MD  Insulin Lispro Prot & Lispro  (HUMALOG 75/25 MIX) (75-25) 100 UNIT/ML Kwikpen Inject 15 Units into the skin 2 (two) times daily. 09/28/17   Nita Sells, MD  lamoTRIgine (LAMICTAL) 150 MG tablet Take 150 mg by mouth 2 (two) times daily. 08/06/17   [provider]  lithium carbonate (ESKALITH) 450 MG CR tablet Take 450-675 mg by mouth See admin instructions. 675 mg by mouth in the morning and 450 mg at bedtime    [provider]  metFORMIN (GLUCOPHAGE) 1000 MG tablet Take 1 tablet (1,000 mg total) by mouth 2 (two) times daily with a meal. 12/22/17   Katherine Roan, MD  multivitamin (ONE-A-DAY MEN'S) TABS tablet Take 1 tablet by mouth daily.    [provider]  NOVOLOG MIX 70/30 (70-30) 100 UNIT/ML injection INJECT 15 UNITS INTO THE SKIN TWICE DAILY WITH A MEAL 08/30/18   [provider]  omeprazole (PRILOSEC) 40 MG capsule Take 40 mg by mouth daily.    [provider]  pregabalin (LYRICA) 150 MG capsule TAKE 1 CAPSULE BY MOUTH TWICE DAILY 08/31/18   Katherine Roan, MD  QUEtiapine (SEROQUEL) 200 MG tablet Take 1 tablet (200 mg total) by mouth at bedtime. 09/28/17   Nita Sells, MD  traMADol (ULTRAM) 50 MG tablet Take 1 tablet (50 mg total) by mouth every 12 (twelve) hours as needed for severe pain. 09/15/18 10/15/18  Katherine Roan, MD  VENTOLIN HFA [phone] Base) MCG/ACT inhaler Inhale 2 puffs into the lungs every 6 (six) hours as needed for wheezing or shortness of breath. 03/03/18   Katherine Roan, MD    Family History Family History  Adopted: Yes    Social History Social History   Tobacco Use  . Smoking status: Current Some Day Smoker    Packs/day: 0.30    Years: 45.00    Pack years: 13.50    Types: Cigarettes    Last attempt to quit: 07/10/2017    Years since quitting: 1.2  . Smokeless tobacco: Current User    Types: Snuff  . Tobacco comment: .5  pack per day . Cutting back  Substance Use Topics  . Alcohol use: Yes    Comment: Whiskey.  . Drug  use: No     Allergies   Yellow jacket venom [bee venom]   Review of Systems Review of Systems  All other systems negative except as documented in the HPI. All pertinent positives and negatives as reviewed in the HPI. Physical Exam Updated Vital Signs BP 112/79   Pulse (!) 118   Temp 99.7 F (37.6 C) (Axillary)   Resp (!) 21   Ht 6' 1"  (1.854 m)   Wt 122 kg   SpO2 98%   BMI 35.49 kg/m   Physical Exam Vitals signs and nursing note reviewed.  Constitutional:  General: He is not in acute distress.    Appearance: He is well-developed.  HENT:     Head: Normocephalic and atraumatic.  Eyes:     Pupils: Pupils are equal, round, and reactive to light.  Neck:     Musculoskeletal: Normal range of motion and neck supple.  Cardiovascular:     Rate and Rhythm: Normal rate and regular rhythm.     Heart sounds: Normal heart sounds. No murmur. No friction rub. No gallop.   Pulmonary:     Effort: Respiratory distress present.     Breath sounds: Wheezing and rhonchi present.  Abdominal:     General: Bowel sounds are normal. There is no distension.     Palpations: Abdomen is soft.     Tenderness: There is no abdominal tenderness.  Skin:    General: Skin is warm and dry.     Capillary Refill: Capillary refill takes less than 2 seconds.     Findings: No erythema or rash.  Neurological:     Mental Status: He is alert and oriented to person, place, and time.     Motor: No abnormal muscle tone.     Coordination: Coordination normal.  Psychiatric:        Behavior: Behavior normal.      ED Treatments / Results  Labs (all labs ordered are listed, but only abnormal results are displayed) Labs Reviewed  LACTIC ACID, PLASMA - Abnormal; Notable for the following components:      Result Value   Lactic Acid, Venous 2.0 (*)    All other components within normal limits  COMPREHENSIVE METABOLIC PANEL - Abnormal; Notable for the following components:   Sodium 131 (*)    Potassium  2.6 (*)    Chloride 95 (*)    Glucose, Bld 227 (*)    Creatinine, Ser 1.26 (*)    Albumin 3.1 (*)    AST 42 (*)    All other components within normal limits  CBC WITH DIFFERENTIAL/PLATELET - Abnormal; Notable for the following components:   WBC 15.3 (*)    RBC 4.06 (*)    Hemoglobin 12.1 (*)    HCT 37.6 (*)    Neutro Abs 12.7 (*)    Monocytes Absolute 1.3 (*)    Abs Immature Granulocytes 0.11 (*)    All other components within normal limits  INFLUENZA PANEL BY PCR (TYPE A & B) - Abnormal; Notable for the following components:   Influenza A By PCR POSITIVE (*)    All other components within normal limits  CULTURE, BLOOD (ROUTINE X 2)  CULTURE, BLOOD (ROUTINE X 2)  LACTIC ACID, PLASMA  URINALYSIS, ROUTINE W REFLEX MICROSCOPIC    EKG EKG Interpretation  Date/Time:  Friday October 07 2018 12:34:30 EST Ventricular Rate:  116 PR Interval:    QRS Duration: 147 QT Interval:  456 QTC Calculation: 634 R Axis:   9 Text Interpretation:  Sinus tachycardia IVCD, consider atypical RBBB No significant change since last tracing Confirmed by Isla Pence [phone]) on 10/07/2018 12:50:44 PM   Radiology Dg Chest Port 1 View  Result Date: 10/07/2018 CLINICAL DATA:  Cough and fever EXAM: PORTABLE CHEST 1 VIEW COMPARISON:  September 22, 2017 FINDINGS: There is airspace consolidation throughout much of the right upper lobe as well as in portions of each lower lung zone region. Heart is upper normal in size with pulmonary vascularity normal. No adenopathy. No bone lesions. IMPRESSION: Multifocal pneumonia, most severe in the right upper lobe. No adenopathy evident.  Stable cardiac silhouette. Followup PA and lateral chest radiographs recommended in 3-4 weeks following trial of antibiotic therapy to ensure resolution and exclude underlying malignancy. Electronically Signed   By: Lowella Grip III M.D.   On: 10/07/2018 12:49   Dg Foot Complete Left  Result Date: 10/06/2018 Please see detailed  radiograph report in office note.  Dg Foot Complete Right  Result Date: 10/06/2018 Please see detailed radiograph report in office note.   Procedures Procedures (including critical care time)  Medications Ordered in ED Medications  albuterol (PROVENTIL,VENTOLIN) solution continuous neb (15 mg/hr Nebulization New Bag/Given 10/07/18 1235)     Initial Impression / Assessment and Plan / ED Course  I have reviewed the triage vital signs and the nursing notes.  Pertinent labs & imaging results that were available during my care of the patient were reviewed by me and considered in my medical decision making (see chart for details).        CRITICAL CARE Performed by: Resa Miner Luciann Gossett Total critical care time: 30 minutes Critical care time was exclusive of separately billable procedures and treating other patients. Critical care was necessary to treat or prevent imminent or life-threatening deterioration. Critical care was time spent personally by me on the following activities: development of treatment plan with patient and/or surrogate as well as nursing, discussions with consultants, evaluation of patient's response to treatment, examination of patient, obtaining history from patient or surrogate, ordering and performing treatments and interventions, ordering and review of laboratory studies, ordering and review of radiographic studies, pulse oximetry and re-evaluation of patient's condition.   Patient has COPD along with positive flu testing along with a multifocal pneumonia noted on chest x-ray.  Patient started on antibiotics he is also been given 2 nebulized treatments over 1 hour.  He is also been given steroids.  Patient did not dramatically improve after first nebulizer treatment.  The patient Final Clinical Impressions(s) / ED Diagnoses   Final diagnoses:  None    ED Discharge Orders    None       Dalia Heading, PA-C 10/07/18 1431    Isla Pence,  MD 10/07/18 [phone]

## 2018-10-07 NOTE — H&P (Signed)
Date: 10/07/2018               Patient Name:  Jesus Martin MRN: [id number]  DOB: 02-Aug-1961 Age / Sex: 58 y.o., male   PCP: Katherine Roan, MD         Medical Service: Internal Medicine Teaching Service         Attending Physician: Dr. Sid Falcon, MD    First Contact: Dr. Donne Hazel  Pager: [phone]  Second Contact: Dr. Trilby Drummer  Pager: [phone]       After Hours (After 5p/  First Contact Pager: [phone]  weekends / holidays): Second Contact Pager: [phone]   Chief Complaint: cough, shortness of breath  History of Present Illness:  58 year old man with COPD, hypertension, hyperlipidemia, diabetes requiring insulin with neuropathy, tobacco use, bipolar disorder who presents with cough and shortness of breath.  Three days ago he began having a runny nose.  Symptoms suddenly worsened 2 days prior to presentation when he began having cough productive of purulent sputum, wheezing, fever, backache and sore throat Wednesday morning.  On Wednesday evening he vomited once.  Yesterday he started feeling dizzy and off balance, his wife notes that he seemed confused and was speaking non sensibly, he also developed reduced appetite, abdominal pain and diarrhea.  He denies sick contacts he did receive his flu vaccine this year.    In the ED he was noted to have a heart rate of 120, blood pressure 132/82.  He was initially hypoxic in the upper 80s he was placed on 2 L oxygen and oxygen saturation improved to the 90s.  Labs revealed a sodium of 131, potassium of 2.6, creatinine of 1.26 with a baseline of 0.9, lactate 2, leukocyte 1500 with neutrophil predominance, hemoglobin 12 ( baseline normal).  He was flu a positive.  Chest x-ray was concerning for multifocal pneumonia loosely over the right upper lobe.  Blood cultures were drawn.  He received azithromycin, ceftriaxone, Solu-Medrol, albuterol, 3.6 L normal saline and potassium repletion. Internal medicine was called for admission.   Meds:  No  outpatient medications have been marked as taking for the 10/07/18 encounter [facility] Encounter).     Allergies: Allergies as of 10/07/2018 - Review Complete 10/07/2018  Allergen Reaction Noted  . Yellow jacket venom [bee venom] Anaphylaxis and Other (See Comments) 09/20/2017   Past Medical History:  Diagnosis Date  . Bipolar I disorder (HCC) 1991   Dr. Toy Care  . Borderline hyperlipidemia    history of  . Childhood asthma   . COPD (chronic obstructive pulmonary disease) ([place])   . Diabetes mellitus, new onset ([place]) 12/22/2017  . Elevated glucose   . Fibromyalgia   . GERD (gastroesophageal reflux disease)   . HTN (hypertension)   . Low vitamin B12 level 06/07/2018  . Lower back pain   . Paranoia ([place])   . Tobacco abuse   . Tobacco abuse   . Vitamin D deficiency     Family History:  Family History  Adopted: Yes   Social History: Patient has a 45 pack year smoking history, he continues to use tobacco. He denies alcohol or illicit drug use. He lives at home with his wife.   Review of Systems: A complete ROS was negative except as per HPI.   Physical Exam: Blood pressure 118/79, pulse (!) 110, temperature 99.7 F (37.6 C), temperature source Axillary, resp. rate 17, height 6' 1"  (1.854 m), weight 122 kg, SpO2 92 %. General: ill appearing,  diaphoretic Cardiac: Elevated rate, regular rhythm, no murmurs rubs or gallops, no peripheral edema Pulmonary: Nasal cannula, pursed lips breathing, accessory muscle use, speaking in broken sentences, diffuse expiratory wheeze, rhonchi difficult to appreciate over the wheeze VI: Abdomen is soft, nontender, nondistended Neuro: Awake, alert, oriented to person and place says that it is October 11, 2018, cranial nerves intact, upper and lower remedy proximal and distal muscles intact, sensation over the face and extremities intact, no ataxia  EKG: personally reviewed my interpretation is sinus rhythm right bundle branch block  CXR: personally  reviewed my interpretation is diffuse patchy opacities mostly over the right upper lobe, ?volume loss  Assessment & Plan by Problem: Active Problems:   Influenza and pneumonia  Influenza A Multifocal pneumonia COPD exacerbation Mr. Satz is a 58 year old man with history of COPD and diabetes who presents with productive cough, wheezing, shortness of breath hypoxia, fevers, body aches, diarrhea and confusion.  At presentation he has sinus tachycardia, increased work of breathing and a new oxygen requirement.  Work-up has revealed hypokalemia, hyponatremia, increased creatinine leukocytosis with neutrophil predominance flu a positive with multifocal pneumonia. It seems that this flu infection has led to an exacerbation of his COPD and may be complicated by bacterial superinfection. Will treat broadly for Flu, CAP and COPD exacerbation.  - scheduled duoneb q6h  - continue home trelegy  - prednisone 40 mg x 4 days  - tamiflu x 5 days  - continue ctx and azithromycin  - obtain legionella serologies  - obtain venous blood gas  - admit to stepdown - chest xray concerning for volume loss ( elevation of the right pulmonary fissure and diaphragm, difficult to appreciate the hila) would consider repeat imaging after treatment given these findings and smoking history   Hypokalemia  Possibly secondary to diarrhea.  - Replace potassium   Diabetes  On metformin and novolog mix insulin 70-30 15 units BIDat home.  - sliding scale - sensitive and CBG monitoring  - continue lyrica for neuropathy  Hypertension  - hold home amlodipine for now   Chronic pain  - continue home tramadol PRN   Bipolar  - continue home lamictal, lithium and quetiapine   Dispo: Admit patient to Inpatient with expected length of stay greater than 2 midnights.  Signed: Ledell Noss, MD 10/07/2018, 4:44 PM  Pager: [phone]

## 2018-10-07 NOTE — ED Notes (Signed)
Was drinking cold milkshake, need to get oral temp still

## 2018-10-07 NOTE — ED Triage Notes (Signed)
Pt arrives to ED with c/o of throat pain and resp distress pt has audible wheeze with labored respirations. Pt's speech is hoarse.

## 2018-10-07 NOTE — Progress Notes (Signed)
Notified MD about no insulin orders and patient glucose.

## 2018-10-08 ENCOUNTER — Encounter (HOSPITAL_COMMUNITY): Payer: Self-pay | Admitting: *Deleted

## 2018-10-08 DIAGNOSIS — E876 Hypokalemia: Secondary | ICD-10-CM

## 2018-10-08 DIAGNOSIS — E785 Hyperlipidemia, unspecified: Secondary | ICD-10-CM

## 2018-10-08 DIAGNOSIS — E1140 Type 2 diabetes mellitus with diabetic neuropathy, unspecified: Secondary | ICD-10-CM

## 2018-10-08 DIAGNOSIS — J154 Pneumonia due to other streptococci: Principal | ICD-10-CM

## 2018-10-08 DIAGNOSIS — Z79899 Other long term (current) drug therapy: Secondary | ICD-10-CM

## 2018-10-08 DIAGNOSIS — J1108 Influenza due to unidentified influenza virus with specified pneumonia: Secondary | ICD-10-CM

## 2018-10-08 DIAGNOSIS — J101 Influenza due to other identified influenza virus with other respiratory manifestations: Secondary | ICD-10-CM

## 2018-10-08 DIAGNOSIS — J449 Chronic obstructive pulmonary disease, unspecified: Secondary | ICD-10-CM

## 2018-10-08 DIAGNOSIS — I10 Essential (primary) hypertension: Secondary | ICD-10-CM

## 2018-10-08 DIAGNOSIS — Z794 Long term (current) use of insulin: Secondary | ICD-10-CM

## 2018-10-08 DIAGNOSIS — Z720 Tobacco use: Secondary | ICD-10-CM

## 2018-10-08 LAB — BLOOD CULTURE ID PANEL (REFLEXED)

## 2018-10-08 LAB — BASIC METABOLIC PANEL
Anion gap: 7 (ref 5–15)
BUN: 11 mg/dL (ref 6–20)
CALCIUM: 9.3 mg/dL (ref 8.9–10.3)
CO2: 26 mmol/L (ref 22–32)
Chloride: 108 mmol/L (ref 98–111)
Creatinine, Ser: 0.98 mg/dL (ref 0.61–1.24)
GFR calc Af Amer: >60 mL/min (ref >60)
GFR calc non Af Amer: >60 mL/min (ref >60)
Glucose, Bld: 272 mg/dL — ABNORMAL HIGH (ref 70–99)
Potassium: 3.5 mmol/L (ref 3.5–5.1)
SODIUM: 141 mmol/L (ref 135–145)

## 2018-10-08 LAB — GLUCOSE, CAPILLARY
Glucose-Capillary: 240 mg/dL — ABNORMAL HIGH (ref 70–99)
Glucose-Capillary: 215 mg/dL — ABNORMAL HIGH (ref 70–99)
Glucose-Capillary: 193 mg/dL — ABNORMAL HIGH (ref 70–99)
Glucose-Capillary: 181 mg/dL — ABNORMAL HIGH (ref 70–99)

## 2018-10-08 LAB — HIV ANTIBODY (ROUTINE TESTING W REFLEX): HIV SCREEN 4TH GENERATION: NONREACTIVE

## 2018-10-08 LAB — STREP PNEUMONIAE URINARY ANTIGEN: STREP PNEUMO URINARY ANTIGEN: NEGATIVE

## 2018-10-08 LAB — LEGIONELLA PNEUMOPHILA SEROGP 1 UR AG: L. PNEUMOPHILA SEROGP 1 UR AG: NEGATIVE

## 2018-10-08 MED ORDER — CEFAZOLIN SODIUM-DEXTROSE 2-4 GM/100ML-% IV SOLN
2.0000 g | Freq: Three times a day (TID) | INTRAVENOUS | Status: DC
  Administered 2018-10-08 – 2018-10-12 (×12): 2 g via INTRAVENOUS
  Filled 2018-10-08 (×13): qty 100

## 2018-10-08 MED ORDER — IPRATROPIUM-ALBUTEROL 0.5-2.5 (3) MG/3ML IN SOLN
3.0000 mL | RESPIRATORY_TRACT | Status: DC
  Administered 2018-10-08 – 2018-10-12 (×22): 3 mL via RESPIRATORY_TRACT
  Filled 2018-10-08 (×23): qty 3

## 2018-10-08 MED ORDER — CHLORHEXIDINE GLUCONATE 0.12 % MT SOLN
15.0000 mL | Freq: Two times a day (BID) | OROMUCOSAL | Status: DC
  Administered 2018-10-08 – 2018-10-12 (×10): 15 mL via OROMUCOSAL
  Filled 2018-10-08 (×10): qty 15

## 2018-10-08 MED ORDER — ENOXAPARIN SODIUM 40 MG/0.4ML ~~LOC~~ SOLN
40.0000 mg | SUBCUTANEOUS | Status: DC
  Administered 2018-10-08 – 2018-10-11 (×4): 40 mg via SUBCUTANEOUS
  Filled 2018-10-08 (×4): qty 0.4

## 2018-10-08 MED ORDER — GUAIFENESIN ER 600 MG PO TB12
600.0000 mg | ORAL_TABLET | Freq: Two times a day (BID) | ORAL | Status: AC
  Administered 2018-10-08 – 2018-10-10 (×6): 600 mg via ORAL
  Filled 2018-10-08 (×6): qty 1

## 2018-10-08 MED ORDER — ORAL CARE MOUTH RINSE
15.0000 mL | Freq: Two times a day (BID) | OROMUCOSAL | Status: DC
  Administered 2018-10-08 – 2018-10-12 (×9): 15 mL via OROMUCOSAL

## 2018-10-08 MED ORDER — POTASSIUM CHLORIDE CRYS ER 20 MEQ PO TBCR
40.0000 meq | EXTENDED_RELEASE_TABLET | Freq: Once | ORAL | Status: AC
  Administered 2018-10-08: 40 meq via ORAL
  Filled 2018-10-08: qty 2

## 2018-10-08 MED ORDER — INSULIN GLARGINE 100 UNIT/ML ~~LOC~~ SOLN
5.0000 [IU] | Freq: Every day | SUBCUTANEOUS | Status: DC
  Administered 2018-10-08 – 2018-10-09 (×2): 5 [IU] via SUBCUTANEOUS
  Filled 2018-10-08 (×2): qty 0.05

## 2018-10-08 NOTE — Progress Notes (Signed)
   Subjective: The patient was lying back in his bed today pending the room.  He continues to endorse notable wheezing, dyspnea, and increased work of breathing.  He requested assistance with mucolytic's due to his congestion and dyspnea.  Objective:  Vital signs in last 24 hours: Vitals:   10/08/18 0005 10/08/18 0155 10/08/18 0405 10/08/18 0825  BP: 125/86  123/87   Pulse: (!) 106  (!) 108   Resp: (!) 21  (!) 23   Temp:   98.6 F (37 C)   TempSrc:   Oral   SpO2: 91% 93% 92% 95%  Weight:      Height:       General: A/O x4, in no acute distress with increased work of breathing, afebrile, nondiaphoretic Cardio: RRR, no mrg's Pulmonary: Notable expiratory wheezing bilaterally with crackles in both lung fields Psych: Appropriate affect, not depressed in appearance, engages well  Assessment/Plan:  Active Problems:   Influenza and pneumonia  Jesus Martin is a 58 year old male with a past medical history of COPD, insulin-dependent diabetes who presented with cough, wheezing, Twis of breath, fever, body aches, diarrhea and confusion and found to be hypoxic.  Patient demonstrated associated tachycardia, increased work of breathing in addition to the hypoxia.  Influenza a positive status was determined with bilateral Pulmonary Infiltrates on Chest X-Ray.  Patient Was Admitted for secondary to influenza with possible secondary bacterial component.  Plan: Influenza A, multifocal pneumonia: Given the patient's history of COPD, he is at increased risk for severe illness secondary to pulmonary pathology.  We will continue current antibiotics and Tamiflu until symptomatic improvement occurs.  Single blood culture ID positive for strep pyogenes.  Will monitor remaining cultures and continue CAP coverage at this time. Pulmonary rehab ordered Flutter valve ordered Mucinex 12hr 645m ordered scheduled Scheduled duonebs every 4 hours ordered Continue home trelegy Tamiflu for 5 days, on day  2 Continue ceftriaxone as it azithromycin x5 days or 48/72 hours following clinical improvement Legionella serology negative Discontinue prednisone Strep pneumo urinary antigen ordered, not collected Follow blood cultures  CBC in am  Hypokalemia: Resolved singular treatment with oral and IV potassium.  Potassium 2.5 this a.m. no clear etiology but possibly secondary to GI loss.  P.o. potassium 40 mEq one-time dose this afternoon Repeat BMP in morning  Diabetes mellitus type 2: Holding home metformin and NovoLog 70/30 15 units twice daily Initiated SSI sensitive and CBG monitoring Lantus 5U QHS Continue Lyrica for neuropathy  Diet: Carb modified Code: Full DVT PPX: enoxaparin Fluids: NA Dispo: Anticipated discharge in approximately 4-5 day(s) when clinically stable.  HKathi Ludwig MD 10/08/2018, 10:33 AM Pager: Pager# [phone]

## 2018-10-08 NOTE — Progress Notes (Signed)
  Date: 10/08/2018  Patient name: Jesus Martin  Medical record number: [id number]  Date of birth: 27-Nov-1960   I have seen and evaluated Jesus Martin and discussed their care with the Residency Team. Briefly, Jesus Martin is a 58 year old man with HTN, COPD, HLD, DM, tobacco use who presented with influenza, multifocal pneumonia and BC growing streptococcus.  He is being treatment with antibiotics and tamiflu.  He continues to feel SOB and is requiring oxygen.  He is improved for about 2 hours with breathing treatments.  Given his underlying COPD, I anticipate he may have a prolonged recovery from the flu.     Vitals:   10/08/18 1139 10/08/18 1554  BP:    Pulse:    Resp:    Temp:    SpO2: 94% 94%   General: Alert, Awake, SOB Eyes: + conjunctival injection bilaterally, no icterus HENT: Somewhat dry MM, [place] in place CV: RR, NR Pulm: Course, rhonchourus breath sounds throughout, some wheezing expiratory Abd: Obese, NT, +BS  Assessment and Plan: I have seen and evaluated the patient as outlined above. I agree with the formulated Assessment and Plan as detailed in the residents' note, with the following changes:   1. Influenza, streptococcal pneumonia - Reviewed ID note, narrow Abx - Continue tamiflu - Duonebs q 4 hours for today - Oxygen to keep saturation 88-92% - Monitor in progressive unit - Telemetry - Cefazolin started today in lieu of ceftriaxone - Follow cultures - Urinary antigens for legionella, strep pneumo  Other issues per Dr. Nelma Rothman note.   Sid Falcon, MD 2/29/20204:12 PM

## 2018-10-08 NOTE — Progress Notes (Signed)
ID PROGRESS NOTE  Patient has group a strep bacteremia, likely secondary from streptococcal/influenza pneumonia  Will change ceftriaxone to cefazolin 2gm IV q 8. Ideally can further narrow to iv penicillin.  Elzie Rings [facility] [phone]

## 2018-10-08 NOTE — Progress Notes (Signed)
PHARMACY - PHYSICIAN COMMUNICATION CRITICAL VALUE ALERT - BLOOD CULTURE IDENTIFICATION (BCID)  [name] is an 58 y.o. male who presented to [facility] on 10/07/2018 with a chief complaint of PNA   Name of physician (or Provider) Contacted: Dr. Laural Golden   Current antibiotics: Ceftriaxone/Azithromycin  Changes to prescribed antibiotics recommended:  Defer changes to day team per MD  Results for orders placed or performed during the hospital encounter of 10/07/18  Blood Culture ID Panel (Reflexed) (Collected: 10/07/2018  1:01 PM)  Result Value Ref Range   Enterococcus species NOT DETECTED NOT DETECTED   Listeria monocytogenes NOT DETECTED NOT DETECTED   Staphylococcus species NOT DETECTED NOT DETECTED   Staphylococcus aureus (BCID) NOT DETECTED NOT DETECTED   Streptococcus species DETECTED (A) NOT DETECTED   Streptococcus agalactiae NOT DETECTED NOT DETECTED   Streptococcus pneumoniae NOT DETECTED NOT DETECTED   Streptococcus pyogenes DETECTED (A) NOT DETECTED   Acinetobacter baumannii NOT DETECTED NOT DETECTED   Enterobacteriaceae species NOT DETECTED NOT DETECTED   Enterobacter cloacae complex NOT DETECTED NOT DETECTED   Escherichia coli NOT DETECTED NOT DETECTED   Klebsiella oxytoca NOT DETECTED NOT DETECTED   Klebsiella pneumoniae NOT DETECTED NOT DETECTED   Proteus species NOT DETECTED NOT DETECTED   Serratia marcescens NOT DETECTED NOT DETECTED   Haemophilus influenzae NOT DETECTED NOT DETECTED   Neisseria meningitidis NOT DETECTED NOT DETECTED   Pseudomonas aeruginosa NOT DETECTED NOT DETECTED   Candida albicans NOT DETECTED NOT DETECTED   Candida glabrata NOT DETECTED NOT DETECTED   Candida krusei NOT DETECTED NOT DETECTED   Candida parapsilosis NOT DETECTED NOT DETECTED   Candida tropicalis NOT DETECTED NOT DETECTED    Narda Bonds 10/08/2018  6:31 AM

## 2018-10-09 LAB — BASIC METABOLIC PANEL
Anion gap: 11 (ref 5–15)
BUN: 14 mg/dL (ref 6–20)
CO2: 22 mmol/L (ref 22–32)
Calcium: 9.8 mg/dL (ref 8.9–10.3)
Chloride: 106 mmol/L (ref 98–111)
Creatinine, Ser: 1.11 mg/dL (ref 0.61–1.24)
GFR calc Af Amer: >60 mL/min (ref >60)
GFR calc non Af Amer: >60 mL/min (ref >60)
Glucose, Bld: 164 mg/dL — ABNORMAL HIGH (ref 70–99)
Potassium: 3.4 mmol/L — ABNORMAL LOW (ref 3.5–5.1)
Sodium: 139 mmol/L (ref 135–145)

## 2018-10-09 LAB — GLUCOSE, CAPILLARY
Glucose-Capillary: 153 mg/dL — ABNORMAL HIGH (ref 70–99)
Glucose-Capillary: 176 mg/dL — ABNORMAL HIGH (ref 70–99)
Glucose-Capillary: 168 mg/dL — ABNORMAL HIGH (ref 70–99)
Glucose-Capillary: 154 mg/dL — ABNORMAL HIGH (ref 70–99)

## 2018-10-09 LAB — CBC
HCT: 33.9 % — ABNORMAL LOW (ref 39.0–52.0)
Hemoglobin: 11.2 g/dL — ABNORMAL LOW (ref 13.0–17.0)
MCH: 30.7 pg (ref 26.0–34.0)
MCHC: 33 g/dL (ref 30.0–36.0)
MCV: 92.9 fL (ref 80.0–100.0)
PLATELETS: 204 10*3/uL (ref 150–400)
RBC: 3.65 MIL/uL — ABNORMAL LOW (ref 4.22–5.81)
RDW: 13.9 % (ref 11.5–15.5)
WBC: 21.3 10*3/uL — ABNORMAL HIGH (ref 4.0–10.5)
nRBC: 0.3 % — ABNORMAL HIGH (ref 0.0–0.2)

## 2018-10-09 MED ORDER — POTASSIUM CHLORIDE CRYS ER 20 MEQ PO TBCR
40.0000 meq | EXTENDED_RELEASE_TABLET | Freq: Once | ORAL | Status: AC
  Administered 2018-10-09: 40 meq via ORAL
  Filled 2018-10-09: qty 2

## 2018-10-09 MED ORDER — AZITHROMYCIN 500 MG PO TABS
250.0000 mg | ORAL_TABLET | Freq: Every day | ORAL | Status: AC
  Administered 2018-10-09 – 2018-10-11 (×3): 250 mg via ORAL
  Filled 2018-10-09 (×3): qty 1

## 2018-10-09 MED ORDER — GUAIFENESIN-DM 100-10 MG/5ML PO SYRP
5.0000 mL | ORAL_SOLUTION | ORAL | Status: DC | PRN
  Administered 2018-10-09 – 2018-10-10 (×3): 5 mL via ORAL
  Filled 2018-10-09 (×3): qty 5

## 2018-10-09 MED ORDER — PHENOL 1.4 % MT LIQD
1.0000 | OROMUCOSAL | Status: DC | PRN
  Administered 2018-10-09: 1 via OROMUCOSAL
  Filled 2018-10-09: qty 177

## 2018-10-09 NOTE — Progress Notes (Addendum)
   Subjective: No overnight events. Mr. Mcenery reports that he continues to have dyspnea and cough. He has also developed nasal congestion. He states that he has a mild headache this morning and would like to try tylenol. Overall he is feeling better and wants to get out of bed and walk around. All of his questions were addressed.   Objective:  Vital signs in last 24 hours: Vitals:   10/09/18 0346 10/09/18 0358 10/09/18 0438 10/09/18 0638  BP:   130/85   Pulse:   90 87  Resp:   (!) 24 (!) 21  Temp:  98.3 F (36.8 C)    TempSrc:  Oral    SpO2: 94%  (!) 88% 92%  Weight:      Height:       Gen: lying comfortably in bed, no distress CV: RRR, no murmurs Pulm: Inspiratory and expiratory wheezing, tachypneic on 2.5L oxygen Abd: obese, non-tender Ext: no edema  Assessment/Plan:  Active Problems:   Multifocal pneumonia   Influenza A  Rennie Rouch is a 58 year old male with a past medical history of COPD, insulin-dependent diabetes who presented with cough, wheezing, dyspnea, fever, body aches, diarrhea, and confusion and was found to be hypoxic. Patient initially demonstrated tachycardia and increased work of breathing. Influenza A positive with bilateral pulmonary infiltrates on CXR.  Patient was admitted for hypoxia 2/2 to influenza with likely secondary bacterial pneumonia.  Influenza A, multifocal pneumonia COPD - Symptoms of dyspnea and nasal congestion still present. Continues to require supplemental oxygen, currently on 2.5L. - Given the patient's history of COPD, he is at increased risk for severe illness secondary to pulmonary pathology.  We will continue current antibiotics and Tamiflu until symptomatic improvement occurs. - Single blood culture ID positive for strep pyogenes. ID auto-consulted and believe this is likely 2/2 to his pneumonia/influenza. Strep pneumo antigen negative. Legionella negative. Will monitor remaining cultures and continue CAP coverage at this  time. - Afebrile. Leukocytosis increased from 14.2 to 21.3 today. Patient received steroids early this admission with his last dose of prednisone yesterday. Steroids have been discontinued.  Plan - Pulmonary rehab and flutter valve - Mucinex 12hr 630m ordered scheduled - Scheduled duonebs every 4 hours  - Continue home trelegy - Tamiflu (day 3/5) - Continue ancef and azithromycin (day 3/5) - F/u blood cultures - Trend CBC - PT eval - Continue supplemental oxygen as needed  Hypokalemia - K 3.4 today.  Plan - Replace K orally  - Trend BMP   Diabetes mellitus type 2: - Home regimen includes metformin and NovoLog 70/30 15 units twice daily - Blood sugars were elevated when patient was on steroids. Sugars have been within goal since discontinuing steroids.  Plan - Lantus 5u qhs - Novolog 70/30 15u BID with meals - SSI  - Continue Lyrica for neuropathy  Dispo: Anticipated discharge in approximately 2-3 days.   Lebert Lovern, DAndree Elk MD 10/09/2018, 6:49 AM Pager: [phone]

## 2018-10-09 NOTE — Evaluation (Signed)
Physical Therapy Evaluation Patient Details Name: Jesus Martin MRN: [id number] DOB: [birth year] Today's Date: 10/09/2018   History of Present Illness  58 year old male with a past medical history of COPD, insulin-dependent diabetes who presented with cough, wheezing, dyspnea, fever, body aches, diarrhea, and confusion and was found to be hypoxic. Patient initially demonstrated tachycardia and increased work of breathing.Influenza A positive with bilateral pulmonary infiltrates on CXR. Patient was admitted for hypoxia 2/2 to influenza with likely secondary bacterial pneumonia.  Clinical Impression  Patient seen for therapy assessment.  Mobilizing well despite decreased endurance and SOB. Educated patient on pursed lip breathing and mobility expectations. Anticipate patient will progress well. No further acute PT needs. Will sign off. OF NOTE: saturations to 86% on RA, improved to 92% on 2 liters with seated rest break. DOE 3/4 with activity    Follow Up Recommendations No PT follow up;Supervision - Intermittent    Equipment Recommendations  None recommended by PT    Recommendations for Other Services       Precautions / Restrictions Precautions Precaution Comments: watch o2 saturations      Mobility  Bed Mobility Overal bed mobility: Independent                Transfers Overall transfer level: Independent Equipment used: None                Ambulation/Gait Ambulation/Gait assistance: Independent Gait Distance (Feet): 130 Feet Assistive device: None Gait Pattern/deviations: WFL(Within Functional Limits) Gait velocity: decreased Gait velocity interpretation: <1.8 ft/sec, indicate of risk for recurrent falls General Gait Details: one standing rest break to check saturations, DOE 3/4 with saturations on room air to 86%.  Stairs            Wheelchair Mobility    Modified Rankin (Stroke Patients Only)       Balance Overall balance assessment: Mild  deficits observed, not formally tested                                           Pertinent Vitals/Pain Pain Assessment: No/denies pain    Home Living Family/patient expects to be discharged to:: Private residence Living Arrangements: Spouse/significant other Available Help at Discharge: Family Type of Home: House Home Access: Level entry     Home Layout: One level        Prior Function Level of Independence: Independent               Hand Dominance   Dominant Hand: Right    Extremity/Trunk Assessment   Upper Extremity Assessment Upper Extremity Assessment: Overall WFL for tasks assessed    Lower Extremity Assessment Lower Extremity Assessment: Overall WFL for tasks assessed       Communication   Communication: No difficulties  Cognition Arousal/Alertness: Awake/alert Behavior During Therapy: WFL for tasks assessed/performed Overall Cognitive Status: Within Functional Limits for tasks assessed                                        General Comments      Exercises     Assessment/Plan    PT Assessment Patent does not need any further PT services  PT Problem List         PT Treatment Interventions      PT Goals (Current goals  can be found in the Care Plan section)  Acute Rehab PT Goals PT Goal Formulation: All assessment and education complete, DC therapy    Frequency     Barriers to discharge        Co-evaluation               AM-PAC PT "6 Clicks" Mobility  Outcome Measure Help needed turning from your back to your side while in a flat bed without using bedrails?: None Help needed moving from lying on your back to sitting on the side of a flat bed without using bedrails?: None Help needed moving to and from a bed to a chair (including a wheelchair)?: None Help needed standing up from a chair using your arms (e.g., wheelchair or bedside chair)?: None Help needed to walk in hospital room?:  None Help needed climbing 3-5 steps with a railing? : A Little 6 Click Score: 23    End of Session Equipment Utilized During Treatment: Oxygen Activity Tolerance: Patient limited by fatigue Patient left: in bed;with call bell/phone within reach;with family/visitor present Nurse Communication: Mobility status PT Visit Diagnosis: Difficulty in walking, not elsewhere classified (R26.2)    Time: 7159-5396 PT Time Calculation (min) (ACUTE ONLY): 17 min   Charges:   PT Evaluation $PT Eval Low Complexity: [place], PT DPT  Board Certified Neurologic Specialist Acute Rehabilitation Services Pager [phone] Office [place] 10/09/2018, 5:09 PM

## 2018-10-09 NOTE — Progress Notes (Signed)
  Date: 10/09/2018  Patient name: Jesus Martin  Medical record number: [id number]  Date of birth: 1961/02/28   This patient's plan of care was discussed with the house staff. Please see Dr. Arta Silence note for complete details. I concur with her findings and plan.   Sid Falcon, MD 10/09/2018, 11:54 AM

## 2018-10-10 ENCOUNTER — Ambulatory Visit: Payer: Medicare HMO | Admitting: Dietician

## 2018-10-10 LAB — BASIC METABOLIC PANEL
Anion gap: 7 (ref 5–15)
BUN: 11 mg/dL (ref 6–20)
CO2: 25 mmol/L (ref 22–32)
Calcium: 9.5 mg/dL (ref 8.9–10.3)
Chloride: 110 mmol/L (ref 98–111)
Creatinine, Ser: 1.14 mg/dL (ref 0.61–1.24)
GFR calc Af Amer: >60 mL/min (ref >60)
GFR calc non Af Amer: >60 mL/min (ref >60)
GLUCOSE: 137 mg/dL — AB (ref 70–99)
Potassium: 3.2 mmol/L — ABNORMAL LOW (ref 3.5–5.1)
Sodium: 142 mmol/L (ref 135–145)

## 2018-10-10 LAB — CBC
HCT: 35 % — ABNORMAL LOW (ref 39.0–52.0)
Hemoglobin: 11.3 g/dL — ABNORMAL LOW (ref 13.0–17.0)
MCH: 30.2 pg (ref 26.0–34.0)
MCHC: 32.3 g/dL (ref 30.0–36.0)
MCV: 93.6 fL (ref 80.0–100.0)
Platelets: 262 10*3/uL (ref 150–400)
RBC: 3.74 MIL/uL — ABNORMAL LOW (ref 4.22–5.81)
RDW: 14.1 % (ref 11.5–15.5)
WBC: 19.2 10*3/uL — ABNORMAL HIGH (ref 4.0–10.5)
nRBC: 0.4 % — ABNORMAL HIGH (ref 0.0–0.2)

## 2018-10-10 LAB — GLUCOSE, CAPILLARY
Glucose-Capillary: 126 mg/dL — ABNORMAL HIGH (ref 70–99)
Glucose-Capillary: 155 mg/dL — ABNORMAL HIGH (ref 70–99)
Glucose-Capillary: 181 mg/dL — ABNORMAL HIGH (ref 70–99)
Glucose-Capillary: 124 mg/dL — ABNORMAL HIGH (ref 70–99)

## 2018-10-10 MED ORDER — POTASSIUM CHLORIDE CRYS ER 20 MEQ PO TBCR
40.0000 meq | EXTENDED_RELEASE_TABLET | Freq: Two times a day (BID) | ORAL | Status: AC
  Administered 2018-10-10 (×2): 40 meq via ORAL
  Filled 2018-10-10 (×2): qty 2

## 2018-10-10 NOTE — Progress Notes (Signed)
Medicine attending: I examined this patient today together with resident physician Dr Vilma Prader and I concur with her evaluation and management plan. 58 year old man with insulin-dependent type 2 diabetes, obstructive airway disease admitted on February 28 with flulike symptoms. Chest x-ray showed multifocal pneumonia worse in the right upper lobe.  He was started on antibiotics to cover a community acquired pneumonia.  He was subsequently found to be positive for influenza A.  Tamiflu added to his regimen. He has been slow to recover.  Persistent cough.  Diffuse wheezing on exam today and tight breathing sounds.  Intermittent low-grade fevers up to 100.4.  White count remains elevated.  He is not on steroids. 1 set of blood cultures grew group a strep.  Discussed with pharmacy and infectious disease.  Plan 7-10-day total course of antibiotics.  We may be able to transition to an oral antibiotic once he becomes afebrile.  Obtain survey blood cultures. I did not appreciate a heart murmur. Continue current management plan.

## 2018-10-10 NOTE — Progress Notes (Addendum)
   Subjective: Patient was febrile to 100.4 overnight. His wife reports that it was a rough night because of the fever. He also experienced dyspnea overnight that improved when he got out of bed and sat up. This morning he states his breathing is somewhat improved. He enjoyed working with physical therapy yesterday and said it went well. Per PT, he desatted to 86% on room air while ambulating. Still endorsing productive cough. Has had a good appetite. Wife reports he is moving his bowels well. Wife notes he looks much better today.   Objective:  Vital signs in last 24 hours: Vitals:   10/10/18 0028 10/10/18 0340 10/10/18 0411 10/10/18 0520  BP: (!) 141/77   130/88  Pulse: (!) 111   98  Resp: (!) 28   17  Temp: (!) 100.4 F (38 C)  98.4 F (36.9 C)   TempSrc: Axillary  Oral   SpO2: 93% 92%  92%  Weight:      Height:       General: awake, alert, lying in bed in NAD CV: RRR; no murmurs  Pulm: lungs with diffuse wheezes and rhonchi; saturating well on 2.5L Abd: obese, distended, non-tender Ext: no edema   Assessment/Plan:  Active Problems:   Multifocal pneumonia   Influenza A  Datrell Deberryis a 58 year old male with a past medical history of COPD, insulin-dependent diabetes who presented with cough, wheezing, dyspnea, fever, body aches, diarrhea, and confusion and was found to be hypoxic. Patient initially demonstrated tachycardia and increased work of breathing.Influenza A positive with bilateral pulmonary infiltrates on CXR. Patient was admitted for hypoxia 2/2 to influenza with likely secondary bacterial pneumonia.  InfluenzaA,multifocal pneumonia COPD - Symptoms of dyspnea and cough still present, but patient is overall improving. Continues to require supplemental oxygen, currently on 2.5L. - Single blood culture ID positive for strep pyogenes. Strep pneumo antigen negative. Legionella negative. - I spoke with Dr. Linus Salmons of ID about duration of treatment. He recommended  transitioning to PO amoxicillin when patient clinically improves for a total antibiotic course of 7-10 days.    - Low grade fever to 100.4 overnight, likely in the setting of flu. Leukocytosis decreased from 21.3 to 19.2, may be 2/2 steroid administration earlier this hospitalization. Will continue to trend fever curve and white count. If patient does not clinically improve or continues to fever, will transition from ancef to ceftriaxone for better CAP coverage.  Plan - Pulmonary rehab and flutter valve - Mucinex 12hr 678m ordered scheduled - Scheduledduonebs every 4hours  - Continue home trelegy - Tamiflu (day 4/5) - Continue ancef and azithromycin (day 4/5) - Trend CBC - PT eval - Wean supplemental oxygen as tolerated  Hypokalemia - K 3.2 today Plan - Replace K orally  - Trend BMP   Diabetes mellitus type 2: - Home regimen includes metformin and NovoLog 70/30 15 units twice daily - Blood sugars within goal Plan - Discontinue lantus as patient is not on this at home and it may be contributing to hypokalemia - Novolog 70/30 15u BID with meals - Continue Lyrica for neuropathy  Dispo: Anticipated discharge in approximately 2-3 day(s).   Josuel Koeppen, DAndree Elk MD 10/10/2018, 6:22 AM Pager: [phone]

## 2018-10-11 ENCOUNTER — Inpatient Hospital Stay (HOSPITAL_COMMUNITY): Payer: Medicare HMO

## 2018-10-11 DIAGNOSIS — D72829 Elevated white blood cell count, unspecified: Secondary | ICD-10-CM

## 2018-10-11 LAB — CBC
HCT: 36.6 % — ABNORMAL LOW (ref 39.0–52.0)
Hemoglobin: 11.8 g/dL — ABNORMAL LOW (ref 13.0–17.0)
MCH: 30.5 pg (ref 26.0–34.0)
MCHC: 32.2 g/dL (ref 30.0–36.0)
MCV: 94.6 fL (ref 80.0–100.0)
Platelets: 299 10*3/uL (ref 150–400)
RBC: 3.87 MIL/uL — ABNORMAL LOW (ref 4.22–5.81)
RDW: 14.4 % (ref 11.5–15.5)
WBC: 17.9 10*3/uL — ABNORMAL HIGH (ref 4.0–10.5)
nRBC: 0.4 % — ABNORMAL HIGH (ref 0.0–0.2)

## 2018-10-11 LAB — CULTURE, BLOOD (ROUTINE X 2): Special Requests: ADEQUATE

## 2018-10-11 LAB — BASIC METABOLIC PANEL
Anion gap: 7 (ref 5–15)
BUN: 9 mg/dL (ref 6–20)
CO2: 26 mmol/L (ref 22–32)
Calcium: 9.5 mg/dL (ref 8.9–10.3)
Chloride: 111 mmol/L (ref 98–111)
Creatinine, Ser: 0.91 mg/dL (ref 0.61–1.24)
GFR calc Af Amer: >60 mL/min (ref >60)
GFR calc non Af Amer: >60 mL/min (ref >60)
Glucose, Bld: 145 mg/dL — ABNORMAL HIGH (ref 70–99)
Potassium: 3.6 mmol/L (ref 3.5–5.1)
Sodium: 144 mmol/L (ref 135–145)

## 2018-10-11 LAB — GLUCOSE, CAPILLARY
Glucose-Capillary: 153 mg/dL — ABNORMAL HIGH (ref 70–99)
Glucose-Capillary: 284 mg/dL — ABNORMAL HIGH (ref 70–99)
Glucose-Capillary: 127 mg/dL — ABNORMAL HIGH (ref 70–99)
Glucose-Capillary: 132 mg/dL — ABNORMAL HIGH (ref 70–99)

## 2018-10-11 LAB — SAVE SMEAR(SSMR), FOR PROVIDER SLIDE REVIEW

## 2018-10-11 MED ORDER — SODIUM CHLORIDE 0.9% FLUSH
10.0000 mL | Freq: Two times a day (BID) | INTRAVENOUS | Status: DC
  Administered 2018-10-11 – 2018-10-12 (×3): 10 mL

## 2018-10-11 MED ORDER — SODIUM CHLORIDE 0.9% FLUSH: 10.0000 mL | INTRAVENOUS | Status: DC | PRN

## 2018-10-11 NOTE — Progress Notes (Signed)
   Subjective: No acute events overnight. Breathing continues to improve, especially with breathing treatments. Cough is also resolving and he seems to have more energy. Discussed plan for continued antibiotics and breathing treatments. All questions and concerns were addressed.   Objective:  Vital signs in last 24 hours: Vitals:   10/10/18 1511 10/10/18 2203 10/11/18 0136 10/11/18 0522  BP:      Pulse:      Resp:      Temp:      TempSrc:      SpO2: 94% 95% 94% 94%  Weight:      Height:       General: awake, alert, sitting up in bed in NAD CV: RRR; no murmurs Pulm: lungs with diffuse expiratory wheezing  Ext: trace bilateral lower extremity edema  Assessment/Plan:  Active Problems:   COPD exacerbation (HCC)   Multifocal pneumonia   Influenza A   Diabetes mellitus type 2, insulin dependent ([place])  Jesus Deberryis a 58 year old male with a past medical history of COPD, insulin-dependent diabetes who presented with cough, wheezing,dyspnea, fever, body aches, diarrhea,and confusion andwasfound to be hypoxic. Patient initially demonstrated tachycardia and increased work of breathing.InfluenzaApositive withbilateralpulmonaryinfiltrates on CXR. Patientwas admitted forhypoxia 2/2to influenza withlikelysecondary bacterial pneumonia.  InfluenzaA,multifocal pneumonia Group A strep bacteremia COPD - Symptoms of dyspnea and cough still present, but patient is overall improving. Continues to have expiratory wheezing, will continue breathing treatments. Has been weaned to 1L oxygen.  - He has been afebrile for 24hrs now. Leukocytosis decreased from 19.2 to 17.9. Will continue to trend fever curve and white count.  - If patient does not clinically improve or spikes another fever, will transition from ancef to ceftriaxone for better CAP coverage. If patient continues to clinically improve and be afebrile, can transition to PO amoxicillin for a total abx course of 7-10  days. Plan -Pulmonary rehaband flutter valve -Mucinex 12hr 647m ordered scheduled -Scheduledduonebs every 4hours -Continue home trelegy -Tamiflu(day 5/5) -Continueancefandazithromycin(day 5)  - Repeat blood cx today - Trend CBC - Wean supplemental oxygen as tolerated  Hypokalemia - K 3.6 today Plan - Trend BMP  Diabetes mellitus type 2: -Home regimen includesmetformin and NovoLog 70/30 15 units twice daily - Blood sugars within goal.  Plan - Novolog 70/30 15u BID with meals -Continue Lyrica for neuropathy  Dispo: Anticipated discharge in approximately 1-2 days.   Jesus Martin, DAndree Elk MD 10/11/2018, 6:39 AM Pager: [phone]

## 2018-10-11 NOTE — Progress Notes (Signed)
Medicine attending: I examined this patient today together with resident physician Dr Vilma Prader and I concur with her evaluation and management plan. Persistent diffuse wheezing but moving air well.  Not dyspneic at rest. Oxygenation 92% on 2 L nasal cannula oxygen. White count hovering around 18,000.  Single low-grade temp 100.4 yesterday.  Afebrile today.  Recent persistent elevation unclear.  Nucleated red blood cells reported on peripheral blood film.  This is a sign of significant marrow stress. Repeat CBC  with differential and save smear for our review. Follow-up chest x-ray rule out empyema.

## 2018-10-12 DIAGNOSIS — D72829 Elevated white blood cell count, unspecified: Secondary | ICD-10-CM

## 2018-10-12 DIAGNOSIS — B950 Streptococcus, group A, as the cause of diseases classified elsewhere: Secondary | ICD-10-CM

## 2018-10-12 DIAGNOSIS — F3170 Bipolar disorder, currently in remission, most recent episode unspecified: Secondary | ICD-10-CM

## 2018-10-12 DIAGNOSIS — R7881 Bacteremia: Secondary | ICD-10-CM

## 2018-10-12 DIAGNOSIS — Z888 Allergy status to other drugs, medicaments and biological substances status: Secondary | ICD-10-CM

## 2018-10-12 DIAGNOSIS — Z91030 Bee allergy status: Secondary | ICD-10-CM

## 2018-10-12 DIAGNOSIS — E876 Hypokalemia: Secondary | ICD-10-CM

## 2018-10-12 DIAGNOSIS — A400 Sepsis due to streptococcus, group A: Secondary | ICD-10-CM

## 2018-10-12 DIAGNOSIS — J101 Influenza due to other identified influenza virus with other respiratory manifestations: Secondary | ICD-10-CM

## 2018-10-12 DIAGNOSIS — F319 Bipolar disorder, unspecified: Secondary | ICD-10-CM

## 2018-10-12 LAB — CBC WITH DIFFERENTIAL/PLATELET
Abs Immature Granulocytes: 0.77 10*3/uL — ABNORMAL HIGH (ref 0.00–0.07)
Basophils Absolute: 0.1 10*3/uL (ref 0.0–0.1)
Basophils Relative: 0 %
EOS ABS: 0.4 10*3/uL (ref 0.0–0.5)
EOS PCT: 2 %
HCT: 35.5 % — ABNORMAL LOW (ref 39.0–52.0)
Hemoglobin: 11.3 g/dL — ABNORMAL LOW (ref 13.0–17.0)
Immature Granulocytes: 5 %
Lymphocytes Relative: 22 %
Lymphs Abs: 3.8 10*3/uL (ref 0.7–4.0)
MCH: 30.1 pg (ref 26.0–34.0)
MCHC: 31.8 g/dL (ref 30.0–36.0)
MCV: 94.7 fL (ref 80.0–100.0)
Monocytes Absolute: 0.9 10*3/uL (ref 0.1–1.0)
Monocytes Relative: 5 %
Neutro Abs: 11.2 10*3/uL — ABNORMAL HIGH (ref 1.7–7.7)
Neutrophils Relative %: 66 %
Platelets: 325 10*3/uL (ref 150–400)
RBC: 3.75 MIL/uL — ABNORMAL LOW (ref 4.22–5.81)
RDW: 14.4 % (ref 11.5–15.5)
WBC: 17 10*3/uL — ABNORMAL HIGH (ref 4.0–10.5)
nRBC: 0 % (ref 0.0–0.2)

## 2018-10-12 LAB — BASIC METABOLIC PANEL
Anion gap: 6 (ref 5–15)
BUN: 9 mg/dL (ref 6–20)
CHLORIDE: 111 mmol/L (ref 98–111)
CO2: 26 mmol/L (ref 22–32)
Calcium: 9.3 mg/dL (ref 8.9–10.3)
Creatinine, Ser: 0.89 mg/dL (ref 0.61–1.24)
GFR calc Af Amer: >60 mL/min (ref >60)
Glucose, Bld: 131 mg/dL — ABNORMAL HIGH (ref 70–99)
Potassium: 3.5 mmol/L (ref 3.5–5.1)
Sodium: 143 mmol/L (ref 135–145)

## 2018-10-12 LAB — CULTURE, BLOOD (ROUTINE X 2)
Culture: NO GROWTH
Special Requests: ADEQUATE

## 2018-10-12 LAB — GLUCOSE, CAPILLARY: Glucose-Capillary: 170 mg/dL — ABNORMAL HIGH (ref 70–99)

## 2018-10-12 MED ORDER — AMOXICILLIN 500 MG PO CAPS: 1000.0000 mg | ORAL_CAPSULE | Freq: Three times a day (TID) | ORAL | 0 refills | Status: DC

## 2018-10-12 MED ORDER — AMOXICILLIN 500 MG PO CAPS
1000.0000 mg | ORAL_CAPSULE | Freq: Three times a day (TID) | ORAL | Status: DC
  Administered 2018-10-12: 1000 mg via ORAL
  Filled 2018-10-12: qty 2

## 2018-10-12 MED ORDER — IPRATROPIUM-ALBUTEROL 0.5-2.5 (3) MG/3ML IN SOLN: 3.0000 mL | RESPIRATORY_TRACT | 1 refills | Status: AC | PRN

## 2018-10-12 MED ORDER — IPRATROPIUM-ALBUTEROL 0.5-2.5 (3) MG/3ML IN SOLN
3.0000 mL | Freq: Three times a day (TID) | RESPIRATORY_TRACT | Status: DC
  Administered 2018-10-12: 3 mL via RESPIRATORY_TRACT
  Filled 2018-10-12 (×2): qty 3
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
## 2018-10-12 NOTE — Significant Event (Signed)
Patient has had another acute confusion episode in the middle of the night. This RN was called by Telemetry and told that patient's leads were off. Upon entering room and assessing patient, patient found to have removed leads as well as removed their left upper arm midline. This RN applied pressure until arm stopped bleeding and measured the catheter from the midline (was measured at 8 cm and matches length inserted to patient). On assessment, patient is alert and oriented and able to answer all orientation questions correctly. He stated he 'forgot where he was until I saw you(this RN).' Placed new order for IV access.

## 2018-10-12 NOTE — Discharge Summary (Signed)
Name: Jesus Martin MRN: [id number] DOB: 26-Sep-1960 58 y.o. PCP: Katherine Roan, MD  Date of Admission: 10/07/2018 11:45 AM Date of Discharge: 10/12/2018 Attending Physician: Dr. Beryle Beams  Discharge Diagnosis: 1. Influenza A 2. Multifocal pneumonia with group A strep bacteremia 3. COPD 4. Leukocytosis 5. Hypokalemia  Discharge Medications: Allergies as of 10/12/2018      Reactions   Yellow Jacket Venom [bee Venom] Anaphylaxis, Other (See Comments)   And the patient passes out   5-alpha Reductase Inhibitors    Pt unsure why this is listed      Medication List    TAKE these medications   ACCU-CHEK FASTCLIX LANCETS Misc Check blood sugar 3 times a day.   ACCU-CHEK GUIDE w/Device Kit 1 each by Does not apply route 3 (three) times daily.   acetaminophen 325 MG tablet Commonly known as:  TYLENOL Take 325-650 mg by mouth every 6 (six) hours as needed (for pain or headaches).   alprazolam 2 MG tablet Commonly known as:  XANAX Take 2 mg by mouth 2 (two) times daily as needed for anxiety.   amLODipine 10 MG tablet Commonly known as:  NORVASC TAKE 1 TABLET BY MOUTH ONCE DAILY   amoxicillin 500 MG capsule Commonly known as:  AMOXIL Take 2 capsules (1,000 mg total) by mouth 3 (three) times daily.   atorvastatin 40 MG tablet Commonly known as:  LIPITOR Take 1 tablet (40 mg total) by mouth daily at 6 PM.   blood glucose meter kit and supplies Kit Dispense based on patient and insurance preference. Use up to four times daily as directed. (FOR ICD-9 250.00, 250.01).   diclofenac sodium 1 % Gel Commonly known as:  VOLTAREN Apply 2 g topically 4 (four) times daily. What changed:    when to take this  reasons to take this   diphenhydrAMINE 25 mg capsule Commonly known as:  BENADRYL Take 25 mg by mouth every 8 (eight) hours as needed for allergies.   EPIPEN 2-PAK 0.3 mg/0.3 mL Soaj injection Generic drug:  EPINEPHrine Inject 0.3 mg into the muscle once as  needed (for anaphylactic reaction to bee stings).   Fluticasone-Umeclidin-Vilant 100-62.5-25 MCG/INH Aepb Commonly known as:  TRELEGY ELLIPTA Inhale 1 puff into the lungs daily.   glucose blood test strip Commonly known as:  ACCU-CHEK GUIDE Check blood sugar 3 times a day.   Insulin Lispro Prot & Lispro (75-25) 100 UNIT/ML Kwikpen Commonly known as:  HUMALOG 75/25 MIX Inject 15 Units into the skin 2 (two) times daily.   ipratropium-albuterol 0.5-2.5 (3) MG/3ML Soln Commonly known as:  DUONEB Take 3 mLs by nebulization every 4 (four) hours as needed.   lamoTRIgine 150 MG tablet Commonly known as:  LAMICTAL Take 150 mg by mouth 2 (two) times daily.   lithium carbonate 450 MG CR tablet Commonly known as:  ESKALITH Take 450-675 mg by mouth See admin instructions. 675 mg by mouth in the morning and 450 mg at bedtime   metFORMIN 1000 MG tablet Commonly known as:  GLUCOPHAGE Take 1 tablet (1,000 mg total) by mouth 2 (two) times daily with a meal.   multivitamin Tabs tablet Take 1 tablet by mouth daily.   NOVOLOG MIX 70/30 (70-30) 100 UNIT/ML injection Generic drug:  insulin aspart protamine- aspart Inject 15 Units into the skin 2 (two) times daily with a meal.   omeprazole 40 MG capsule Commonly known as:  PRILOSEC Take 40 mg by mouth daily.   pregabalin 150 MG capsule Commonly  known as:  LYRICA TAKE 1 CAPSULE BY MOUTH TWICE DAILY   QUEtiapine 50 MG tablet Commonly known as:  SEROQUEL Take 225 mg by mouth at bedtime. What changed:  Another medication with the same name was removed. Continue taking this medication, and follow the directions you see here.   traMADol 50 MG tablet Commonly known as:  ULTRAM Take 1 tablet (50 mg total) by mouth every 12 (twelve) hours as needed for severe pain.   VENTOLIN HFA 108 (90 Base) MCG/ACT inhaler Generic drug:  albuterol Inhale 2 puffs into the lungs every 6 (six) hours as needed for wheezing or shortness of breath.             Durable Medical Equipment  (From admission, onward)         Start     Ordered   10/12/18 1316  For home use only DME Nebulizer machine  Once    Question:  Patient needs a nebulizer to treat with the following condition  Answer:  COPD (chronic obstructive pulmonary disease) ([place])   10/12/18 1315   10/12/18 1314  For home use only DME oxygen  Once    Question Answer Comment  Mode or (Route) Nasal cannula   Liters per Minute 1   Frequency Continuous (stationary and portable oxygen unit needed)   Oxygen delivery system Gas      10/12/18 1314          Disposition and follow-up:   JesusJesus Martin was discharged from [facility] in Stable condition.  At the hospital follow up visit please address:  1. Multifocal pneumonia with group A strep bacteremia - Please ensure patient finished course of abx (ending 3/8) - Please get repeat CXR in 2-4 weeks to ensure pneumonia resolution and exclude malignancy - Patient discharged with home oxygen, 1L for ambulation or exertion. Please measure patient's O2 sats while ambulating to determine if he can discontinue the supplemental oxygen. - Please f/u repeat blood cx  2. Leukocytosis - DDx includes myeloproliferative disorders. - Please recheck CBC - Please f/u BCR-ABL and JAK2 labs  3. Hypokalemia - Required PO supplementation during hospitalization. Unknown etiology. Normokalemic at discharge - Please recheck K level  4.  Labs / imaging needed at time of follow-up: CBC, BMP  5.  Pending labs/ test needing follow-up: BCR-ABL, JAK2, blood cx  Follow-up Appointments: Follow-up Information    [facility] Follow up.   Why:  home oxygen and nebulizer arranged, portable oxygen tank and nebulizer will be delivered to bedside prior to discharged          Hospital Course by problem list: 1. Influenza A and multifocal pneumonia with group A strep bacteremia: Jesus Martin a 58 year old male with a medical  history of COPD, insulin-dependent diabetes, and bipolar disorder who presented with cough, wheezing,dyspnea, fever, body aches, diarrhea,and confusion andwasfound to be hypoxic. Patient initially demonstrated tachycardia and increased work of breathing.InfluenzaApositive withbilateralpulmonaryinfiltrates on CXR. 1/2 blood cultures positive for GAS. Legionella and strep pneumo antigens were negative.He received 5 days of tamiflu. He received 5 days of azithromycin and 5 days of IV cephalosporin (ceftriaxone --> ancef). He was discharged with amoxicillin for a total antibiotic course of 10 days. Repeat CXR showed improvement of infection. Patient de-satted to 85% while walking on room air. Saturations were normal at rest on room air. Discharged home with home oxygen 1L to be used when ambulating, moving, or feeling dyspneic at rest. He will follow-up in the [facility] in  one week. Patient should have a repeat CXR in 2 weeks to ensure infection has resolved and to rule out lung malignancy.  2. COPD: Patient initially treated with steroids, but these were discontinued as his symptoms were not felt to be due to a COPD exacerbation. He continued to benefit from bronchodilator treatments during his admission. He had expiratory wheezing on lung exam at discharge, but overall his lung sounds had greatly improved since admission. His home maintenance inhaler was also continued. He was discharged with a home nebulizer for prn albuterol.  3. Leukocytosis: White count elevated to 21 with improvement to 17 at discharge. His elevated white count was initially attributed to steroid administration and infection, however this leukocytosis was persistent despite improved infection and discontinuation of steroids. Has a history of leukocytosis over the past year. DDx includes chronic inflammation from tobacco use or foot hardware as well as myeloproliferative disorder. JAK-2 and BCR-ABL pending at time of discharge. Please  recheck WBC at f/u.    5. Hypokalemia: Decreased to 2.6 requiring replacement. Normokalemic at discharge. No clear etiology of low K. Please recheck K levels at f/u.   Discharge Vitals:   BP 131/74 (BP Location: Right Arm)   Pulse 93   Temp 97.7 F (36.5 C) (Oral)   Resp (!) 25   Ht _0  (1.854 m)   Wt 122 kg   SpO2 93%   BMI 35.49 kg/m   Pertinent Labs, Studies, and Procedures:  CBC Latest Ref Rng & Units 10/12/2018 10/11/2018 10/10/2018  WBC 4.0 - 10.5 K/uL 17.0(H) 17.9(H) 19.2(H)  Hemoglobin 13.0 - 17.0 g/dL 11.3(L) 11.8(L) 11.3(L)  Hematocrit 39.0 - 52.0 % 35.5(L) 36.6(L) 35.0(L)  Platelets 150 - 400 K/uL 325 299 262   CMP Latest Ref Rng & Units 10/12/2018 10/11/2018 10/10/2018  Glucose 70 - 99 mg/dL 131(H) 145(H) 137(H)  BUN 6 - 20 mg/dL _1 Creatinine 0.61 - 1.24 mg/dL 0.89 0.91 1.14  Sodium 135 - 145 mmol/L 143 144 142  Potassium 3.5 - 5.1 mmol/L 3.5 3.6 3.2(L)  Chloride 98 - 111 mmol/L 111 111 110  CO2 22 - 32 mmol/L _2 Calcium 8.9 - 10.3 mg/dL 9.3 9.5 9.5  Total Protein 6.5 - 8.1 g/dL - - -  Total Bilirubin 0.3 - 1.2 mg/dL - - -  Alkaline Phos 38 - 126 U/L - - -  AST 15 - 41 U/L - - -  ALT 0 - 44 U/L - - -   CXR 2/28 Multifocal pneumonia, most severe in the right upper lobe. No adenopathy evident. Stable cardiac silhouette. Followup PA and lateral chest radiographs recommended in 3-4 weeks following trial of antibiotic therapy to ensure resolution and exclude underlying malignancy.  CXR 3/3 Patchy bilateral airspace disease is stable on the left and improved on the right.  Discharge Instructions: Discharge Instructions    Diet - low sodium heart healthy   Complete by:  As directed    Discharge instructions   Complete by:  As directed    It was a pleasure taking care of you, Mr. Mellinger.   You were hospitalized for influenza and pneumonia. You were treated with Tamiflu for the flu and IV antibiotics for the pneumonia. Your repeat chest xray yesterday  showed that the pneumonia was getting better.  You need to take four more days of antibiotics. Take amoxicillin three times daily, take your first dose tonight. Your last dose will be the evening of 3/8.   Your  oxygen levels fell when you were walking around. You should use oxygen at home while your lungs continue to heal. You should be on 1L of oxygen when moving or walking around. You should also use the oxygen if you feel short of breath when you are sitting.   I have also ordered a home nebulizer for you. If you feel short of breath, have wheezing, or have chest tightness, you can take albuterol by the nebulizer.  Please follow-up in the internal medicine clinic next Wednesday.  Feel free to call our clinic at [phone] if you have any questions.  Thanks, Dr. Annie Paras   Increase activity slowly   Complete by:  As directed       Signed: Dorrell, Andree Elk, MD 10/12/2018, 1:41 PM   Pager: [phone]

## 2018-10-12 NOTE — Progress Notes (Signed)
SATURATION QUALIFICATIONS: (This note is used to comply with regulatory documentation for home oxygen)  Patient Saturations on Room Air at Rest = 92%  Patient Saturations on Room Air while Ambulating = 85%  Patient Saturations on 2 Liters of oxygen while Ambulating = 93%  Please briefly explain why patient needs home oxygen: Patient desaturation during ambulation.

## 2018-10-12 NOTE — Care Management Note (Addendum)
Case Management Note  Patient Details  Name: Jesus Martin MRN: [id number] Date of Birth: 10-25-1960  Subjective/Objective:    Admitted with COPD exacerbation/ influenza/ PNA, hx of  COPD, insulin-dependent diabetes. From home with wife. Independent with ADL's PTA, no DMEusage.                Corie Vavra (Spouse)     [phone]      Action/Plan: Transition to home today. Portable oxygen tank and nebulizer will be delivered to bedside prior to d/c for transportation to home. Wife to provide transportation to home.  Expected Discharge Date:    10/12/2018           Expected Discharge Plan:  Home/Self Care  In-House Referral:  NA  Discharge planning Services  CM Consult  Post Acute Care Choice:  NA Choice offered to:  Patient  DME Arranged:  Oxygen, Nebulizer machine DME Agency:  [facility]  HH Arranged:  NA HH Agency:  NA  Status of Service:  Completed, signed off  If discussed at [place] of Stay Meetings, dates discussed:    Additional Comments:  Sharin Mons, RN 10/12/2018, 1:39 PM

## 2018-10-12 NOTE — Plan of Care (Signed)

## 2018-10-16 LAB — CULTURE, BLOOD (ROUTINE X 2)
Culture: NO GROWTH
Culture: NO GROWTH
Special Requests: ADEQUATE
Special Requests: ADEQUATE

## 2018-10-17 LAB — BCR-ABL1, CML/ALL, PCR, QUANT

## 2018-10-18 LAB — JAK2 EXONS 12-15

## 2018-10-18 LAB — JAK2  V617F QUAL. WITH REFLEX TO EXON 12: Reflex:: 15

## 2018-10-19 ENCOUNTER — Ambulatory Visit: Payer: Medicare HMO

## 2018-10-19 ENCOUNTER — Other Ambulatory Visit: Payer: Self-pay | Admitting: *Deleted

## 2018-10-19 ENCOUNTER — Telehealth: Payer: Self-pay

## 2018-10-19 MED ORDER — NOVOLOG MIX 70/30 (70-30) 100 UNIT/ML ~~LOC~~ SUSP: 15.0000 [IU] | Freq: Two times a day (BID) | SUBCUTANEOUS | 1 refills | Status: DC

## 2018-10-19 NOTE — Telephone Encounter (Signed)
I'm not sure why it was changed, perhaps it is on formulary in the hospital and they accidentally continued it.  He can continue taking his regular novolog 70/30 as he was before.  When he runs out I will be happy to refill it.

## 2018-10-19 NOTE — Telephone Encounter (Signed)
Pt's wife requesting to speak with a nurse about meds. Please call back.

## 2018-10-19 NOTE — Telephone Encounter (Signed)
refilled 

## 2018-10-19 NOTE — Telephone Encounter (Signed)
Pt's wife calls and states pt should be on novolog instead of humalog. All records look like insulin was changed to humalog during hospital stay and at disch Please advise

## 2018-10-21 ENCOUNTER — Other Ambulatory Visit: Payer: Self-pay

## 2018-10-21 ENCOUNTER — Ambulatory Visit: Payer: Medicare HMO | Admitting: Dietician

## 2018-10-21 ENCOUNTER — Encounter: Payer: Self-pay | Admitting: Dietician

## 2018-10-21 ENCOUNTER — Ambulatory Visit (INDEPENDENT_AMBULATORY_CARE_PROVIDER_SITE_OTHER): Payer: Medicare HMO | Admitting: Internal Medicine

## 2018-10-21 VITALS — BP 129/79 | HR 99 | Temp 97.8°F | Ht 73.0 in | Wt 248.4 lb

## 2018-10-21 DIAGNOSIS — J1000 Influenza due to other identified influenza virus with unspecified type of pneumonia: Secondary | ICD-10-CM | POA: Diagnosis not present

## 2018-10-21 DIAGNOSIS — J189 Pneumonia, unspecified organism: Secondary | ICD-10-CM

## 2018-10-21 NOTE — Patient Instructions (Addendum)
Jesus Martin,  It was a pleasure meeting you today. Please follow-up with Dr. Shan Levans next month.

## 2018-10-21 NOTE — Patient Instructions (Addendum)
Dear Mr. & Mrs. Bolds,  After you left I noted that the weight we recorded for Mr. Lamartina is about 20 pounds less than it was a month ago. If this is accurate, it is concerning for undernutrition/malnutrition.   I recommend trying to be sure to eat foods higher in protein and healthy fats with at least 3 eating times a day.    You can do this by increasing your milk intake to 2-3 8 ounce glasses per day. Adding yogurt a few time a day. Snack on peanut butter, nuts, humus or guacamole.   Breakfast ideas- breakfast burritos, sandwiches, egg Mcmuffins, yogurt   It is always better to eat food, but if you feel you cannot get enough from food, you could consider a supplement like Boost Glucose Control or Glucerna.  I look forward to following up in 1 month.   Butch Penny [phone]

## 2018-10-21 NOTE — Assessment & Plan Note (Signed)
HPI:  Patient was hospitalized from 2/28-3/4.  He was discharged with antibiotics which he completed and supplemental oxygen. He states he uses the supplemental oxygen at night mostly. He states he continues to feel fatigued but is improving.  Assessment:  Multifocal pneumonia in setting of influenza A Patient was ambulated with pulse ox which showed 95-96 percent without supplemental oxygen.  His lungs were clear to auscultation and he denied fevers and was afebrile in office.  Overall patient is improving although is still feeling fatigued which is to be expected.  Patient should have follow-up chest x-ray in 3-4 weeks. Patient was instructed to see primary care physician in one month and can have imaging done at that time.  Told patient that if he develops symptoms of fever/chills or worsening cough, shortness of breath or chest pain to call the clinic.  While in the hospital patient had persistent leukocytosis and further workup with BCR-ABL and JA K-2 gene analysis was obtained. Follow-up for these tests were negative.  Repeat blood cultures during hospitalization  showed NGTD.    Plan -Return precautions given -Follow-up chest x-ray in 4 weeks -Consult supplemental oxygen use -cbc, bmet

## 2018-10-21 NOTE — Progress Notes (Signed)
   CC: hospital follow up for multi-focal pneumonia secondary to influenza A   HPI:  [name] is a 58 y.o. male with history noted below that presents to the acute care clinic for hospital follow-up for multifocal pneumonia secondary to influenza A. Please see problem based charting for the status of patient's medical conditions.  Past Medical History:  Diagnosis Date  . Bipolar I disorder (HCC) 1991   Dr. Toy Care  . Borderline hyperlipidemia    history of  . Childhood asthma   . COPD (chronic obstructive pulmonary disease) ([place])   . Diabetes mellitus, new onset ([place]) 12/22/2017  . Elevated glucose   . Fibromyalgia   . GERD (gastroesophageal reflux disease)   . HTN (hypertension)   . Low vitamin B12 level 06/07/2018  . Lower back pain   . Paranoia ([place])   . Tobacco abuse   . Tobacco abuse   . Vitamin D deficiency     Review of Systems:  Review of Systems  Constitutional: Positive for malaise/fatigue. Negative for chills and fever.  Respiratory: Negative for cough, sputum production and shortness of breath.   Cardiovascular: Negative for chest pain and leg swelling.    Physical Exam:  Vitals:   10/21/18 0946  BP: 129/79  Pulse: 99  Temp: 97.8 F (36.6 C)  TempSrc: Oral  SpO2: 96%  Weight: 248 lb 6.4 oz (112.7 kg)  Height: 6' 1"  (1.854 m)   Physical Exam  Cardiovascular: Normal rate, regular rhythm and normal heart sounds. Exam reveals no gallop and no friction rub.  No murmur heard. Pulmonary/Chest: Effort normal and breath sounds normal. No respiratory distress. He has no wheezes. He has no rales.  Skin: Skin is warm and dry.     Assessment & Plan:   See encounters tab for problem based medical decision making.    Patient discussed with Dr. Angelia Mould

## 2018-10-21 NOTE — Progress Notes (Signed)
Diabetes Self-Management Education  Visit Type:  (P) Follow-up  Appt. Start Time: 1100 Appt. End Time: 7998  10/21/2018  Jesus Martin, identified by name and date of birth, is a 58 y.o. male with a diagnosis of Diabetes:  Type 2 diabetes ASSESSMENT   weight shows 20# weight loss which is concerning for acute malnutrition.7.8% weight loss in 1 month.  Jesus Martin says he lost his taste for foods with recent hospitalization. He is accompanied by his wife today who shops and cooks for him, she is concerned about easy breakfast and lunches he will prepare and eat when she is not home during the day. They report no previous diabetes training since his diagnosis and he does not feel he can attend group diabetes classes at this time.   His final (the sensor fell off during recent hospitalization)  CGM download shows very well controlled diabetes with very minimal hypoglycemia. He reports taking his insulin daily 15 minutes before eating.   Lab Results  Component Value Date   HGBA1C 5.6 09/26/2018   HGBA1C 5.3 03/28/2018   HGBA1C 6.0 12/22/2017   HGBA1C 11.6 (H) 09/21/2017   HGBA1C 5.4 09/07/2011    Wt Readings from Last 5 Encounters:  10/21/18 248 lb 6.4 oz (112.7 kg)  10/07/18 269 lb (122 kg)  10/03/18 269 lb 14.4 oz (122.4 kg)  09/26/18 269 lb 4.8 oz (122.2 kg)  06/07/18 267 lb 4.8 oz (121.2 kg)   Note two different insulins on his medication list.    Learning Objective:  Patient will have a greater understanding of diabetes self-management. Patient education plan is to attend individual and/or group sessions per assessed needs and concerns.   Plan:   There are no Patient Instructions on file for this visit.   Expected Outcomes:    Education material provided: diabetes meal planning book If problems or questions, patient to contact team via:  Phone  Future DSME appointment: -  as needed. Will refer for 1 month for MNT Jesus Martin, RD 10/21/2018 11:58 AM.

## 2018-10-22 LAB — CBC WITH DIFFERENTIAL/PLATELET
Basophils Absolute: 0.1 10*3/uL (ref 0.0–0.2)
Basos: 1 %
EOS (ABSOLUTE): 0.3 10*3/uL (ref 0.0–0.4)
Eos: 4 %
Hematocrit: 42.3 % (ref 37.5–51.0)
Hemoglobin: 14.2 g/dL (ref 13.0–17.7)
IMMATURE GRANULOCYTES: 0 %
Immature Grans (Abs): 0 10*3/uL (ref 0.0–0.1)
Lymphocytes Absolute: 3.1 10*3/uL (ref 0.7–3.1)
Lymphs: 36 %
MCH: 31.3 pg (ref 26.6–33.0)
MCHC: 33.6 g/dL (ref 31.5–35.7)
MCV: 93 fL (ref 79–97)
Monocytes Absolute: 0.8 10*3/uL (ref 0.1–0.9)
Monocytes: 9 %
NEUTROS PCT: 50 %
Neutrophils Absolute: 4.3 10*3/uL (ref 1.4–7.0)
Platelets: 476 10*3/uL — ABNORMAL HIGH (ref 150–450)
RBC: 4.53 x10E6/uL (ref 4.14–5.80)
RDW: 12.9 % (ref 11.6–15.4)
WBC: 8.5 10*3/uL (ref 3.4–10.8)

## 2018-10-22 LAB — BMP8+ANION GAP
ANION GAP: 18 mmol/L (ref 10.0–18.0)
BUN/Creatinine Ratio: 14 (ref 9–20)
BUN: 13 mg/dL (ref 6–24)
CHLORIDE: 102 mmol/L (ref 96–106)
CO2: 23 mmol/L (ref 20–29)
Calcium: 10.9 mg/dL — ABNORMAL HIGH (ref 8.7–10.2)
Creatinine, Ser: 0.9 mg/dL (ref 0.76–1.27)
GFR calc Af Amer: 109 mL/min/{1.73_m2} (ref >59)
GFR calc non Af Amer: 94 mL/min/{1.73_m2} (ref >59)
Glucose: 108 mg/dL — ABNORMAL HIGH (ref 65–99)
Potassium: 5.2 mmol/L (ref 3.5–5.2)
Sodium: 143 mmol/L (ref 134–144)

## 2018-10-24 NOTE — Progress Notes (Signed)
Internal Medicine Clinic Attending  Case discussed with Dr. Laquasha Groome at the time of the visit.  We reviewed the resident's history and exam and pertinent patient test results.  I agree with the assessment, diagnosis, and plan of care documented in the resident's note.  

## 2018-10-31 ENCOUNTER — Ambulatory Visit: Payer: Medicare HMO | Admitting: Podiatry

## 2018-11-10 ENCOUNTER — Telehealth: Payer: Self-pay | Admitting: *Deleted

## 2018-11-10 NOTE — Telephone Encounter (Signed)
Pt called and stated he wants all his records, labs especially sent to him, that they are his and he should get them if he wants them. Ask doriss. To speak w/ pt, she did and from this day he is to ask at each appt for a copy of his labwork. Sending to dr Shan Levans as Juluis Rainier

## 2018-11-11 ENCOUNTER — Other Ambulatory Visit: Payer: Self-pay | Admitting: Internal Medicine

## 2018-11-11 DIAGNOSIS — G8929 Other chronic pain: Secondary | ICD-10-CM

## 2018-11-11 MED ORDER — TRAMADOL HCL 50 MG PO TABS: 50.0000 mg | ORAL_TABLET | Freq: Two times a day (BID) | ORAL | 0 refills | Status: AC | PRN

## 2018-11-11 MED ORDER — TRAMADOL HCL 50 MG PO TABS: 50.0000 mg | ORAL_TABLET | Freq: Two times a day (BID) | ORAL | 0 refills | Status: DC | PRN

## 2018-11-11 NOTE — Telephone Encounter (Signed)
Tramadol is not on current med list.

## 2018-11-11 NOTE — Telephone Encounter (Signed)
On tramadol chronically, refill appropriate.  3 month supply given

## 2018-11-12 DIAGNOSIS — J449 Chronic obstructive pulmonary disease, unspecified: Secondary | ICD-10-CM | POA: Diagnosis not present

## 2018-11-21 ENCOUNTER — Other Ambulatory Visit: Payer: Self-pay

## 2018-11-21 ENCOUNTER — Ambulatory Visit (INDEPENDENT_AMBULATORY_CARE_PROVIDER_SITE_OTHER): Payer: Medicare HMO | Admitting: Internal Medicine

## 2018-11-21 ENCOUNTER — Encounter: Payer: Self-pay | Admitting: Dietician

## 2018-11-21 ENCOUNTER — Ambulatory Visit: Payer: Medicare HMO | Admitting: Dietician

## 2018-11-21 DIAGNOSIS — J449 Chronic obstructive pulmonary disease, unspecified: Secondary | ICD-10-CM | POA: Diagnosis not present

## 2018-11-21 DIAGNOSIS — E119 Type 2 diabetes mellitus without complications: Secondary | ICD-10-CM | POA: Diagnosis not present

## 2018-11-21 NOTE — Progress Notes (Signed)
   [facility] Telephone Encounter  Reason for call:   This telephone encounter was created for [name] on 11/21/2018 for the following purpose/cc T2DM follow up, COPD follow up.   Pertinent Data:   T2DM, COPD, recent admission for community acquired pneumonia ROS: Pulmonary: pt denies increased work of breathing, shortness of breath,  Cardiac: pt denies palpitations, chest pain,   Abdominal: pt denies abdominal pain, nausea, vomiting, or diarrhea   Assessment / Plan / Recommendations:   COPD: uses nebulizer machine, on trelegy daily and using.  He still feels his breathing is not at his baseline ever since the pneumonia he feels it has taken a long time to recover, feels decompensated.  He is still using oxygen at this time.  The main time he feels he needs it is before bed.  He uses his nebulizer about once per day.    P: discussed using nebulizer before bedtime and that he can use more frequently throughout the day if needed up to 4 times  Continue trelegy  T2DM: Has been a little more up and down since his hospitalization but usually in the low 100's, he is good about keeping a log and letting me know when we need to adjust his medicine.  P: Continue current regimen  Send pt latest labs results and podiatry notes  As always, pt is advised that if symptoms worsen or new symptoms arise, they should go to an urgent care facility or to to ER for further evaluation.   Consent and Medical Decision Making:   Patient discussed with Dr. Beryle Beams  This is a telephone encounter between EUCLID CASSETTA and Vickki Muff on 11/21/2018 for T2DM, COPD follow up. The visit was conducted with the patient located at home and Vickki Muff at [facility]. The patient's identity was confirmed using their DOB and current address. The patient has consented to being evaluated through a telephone encounter and understands the associated risks (an examination  cannot be done and the patient may need to come in for an appointment) / benefits (allows the patient to remain at home, decreasing exposure to coronavirus). I personally spent 12 minutes on medical discussion.

## 2018-11-21 NOTE — Patient Instructions (Addendum)
Dear Mr. Urbani,  Thank you for taking the time to talk to me today.  Regarding your feelings of weakness midday- we discussed that you are going to check your blood sugar when this happens to see if the weakness could be your blood sugar.  Mixed insulin like Novolog Mix 70/30 or Humalog Mix 75/25 gets strongest midday and if it causes your blood sugar to be too low, can cause weakness- see handout.   I included a sheet to record your blood sugar with the date and time of day with this after visit summary.    Take care,   Butch Penny [phone]

## 2018-11-21 NOTE — Progress Notes (Signed)
Visit Type:    Telephone visit due to COVID-19.  This is a telephone encounter between Jesus Martin and Jesus Martin on 11/21/2018 for nutrition and diabetes follow up. The visit was conducted with the patient located at home and Jesus Martin at [facility]. The patient's identity was confirmed using their DOB and current address. The patient has consented to being evaluated through a telephone encounter and understands the associated risks / benefits (allows the patient to remain at home, decreasing exposure to coronavirus).  Appt. Start Time: 1355 Appt. End Time:1415  11/21/2018  Jesus Martin, identified by name and date of birth, is a 58 y.o. male with a diagnosis of Diabetes:  Type 2 diabetes ASSESSMENT He has not weighed recently. He does not have a scale.  He is concerned about his pulse-( low 100s,) and his feeling weak-midday 12-3 pm every day, comes and goes, feels like he should take a nap but doesn't. He reports a good appetite. "I haven;t stopped eating all day" I don;t know if my taste has improved or I am bored" . He feels he drinks plenty of water.  Blood sugar- 1x/day both in am and pm, 90-110 most times he checks Insulin- 70/30 twice a day- 15 units twice a day.  Signs of hypoglycemia- maybe the weakness but otherwise no  Lab Results  Component Value Date   HGBA1C 5.6 09/26/2018    Wt Readings from Last 5 Encounters:  10/21/18 248 lb 6.4 oz (112.7 kg)  10/07/18 269 lb (122 kg)  10/03/18 269 lb 14.4 oz (122.4 kg)  09/26/18 269 lb 4.8 oz (122.2 kg)  06/07/18 267 lb 4.8 oz (121.2 kg)   Learning Objective:  Patient will have a greater understanding of diabetes self-management. Patient education plan is to attend individual and/or group sessions per assessed needs and concerns.  Plan:   Patient Instructions  Dear Jesus Martin,  Thank you for taking the time to talk to me today.  Regarding your feelings of weakness midday- we discussed that you are going to check your  blood sugar when this happens to see if the weakness could be your blood sugar.  Mixed insulin like Novolog Mix 70/30 or Humalog Mix 75/25 gets strongest midday and if it causes your blood sugar to be too low, can cause weakness- see handout.   I included a sheet to record your blood sugar with the date and time of day with this after visit summary.    Take care,   Jesus Martin [phone]  Expected Outcomes:   improved understanding of when to self monitor Education material provided: blood sugar recording sheet and  After visit summary If problems or questions, patient to contact team via:  Phone  Future appointment: - He requests a 1 month  Follow up Jesus Martin, [place] 11/21/2018 4:33 PM.

## 2018-11-22 ENCOUNTER — Other Ambulatory Visit: Payer: Self-pay | Admitting: Internal Medicine

## 2018-11-22 ENCOUNTER — Encounter: Payer: Self-pay | Admitting: Internal Medicine

## 2018-11-22 MED ORDER — OMEPRAZOLE 40 MG PO CPDR: 40.0000 mg | DELAYED_RELEASE_CAPSULE | Freq: Every day | ORAL | 0 refills | Status: DC

## 2018-11-22 NOTE — Progress Notes (Signed)
Medicine attending: Medical history, presenting problems, , and medications, reviewed with resident physician Dr Guadlupe Spanish on the day of the patient telephone consultation and I concur with his evaluation and management plan. DM-2 on insulin. COPD on Trilogy. Recent admit for PNA. Currently stable, back to baseline.

## 2018-11-22 NOTE — Telephone Encounter (Signed)
Refill Request  omeprazole (PRILOSEC) 40 MG capsule    [facility] [place], [place] - [address]

## 2018-11-22 NOTE — Assessment & Plan Note (Signed)
COPD: uses nebulizer machine, on trelegy daily and using.  He still feels his breathing is not at his baseline ever since the pneumonia he feels it has taken a long time to recover, feels decompensated.  He is still using oxygen at this time.  The main time he feels he needs it is before bed.  He uses his nebulizer about once per day.    P: discussed using nebulizer before bedtime and that he can use more frequently throughout the day if needed up to 4 times, Continue trelegy

## 2018-11-22 NOTE — Assessment & Plan Note (Signed)
T2DM: Has been a little more up and down since his hospitalization but usually in the low 100's, he is good about keeping a log and letting me know when we need to adjust his medicine.  P: Continue current regimen

## 2018-11-24 ENCOUNTER — Other Ambulatory Visit: Payer: Self-pay | Admitting: Internal Medicine

## 2018-11-24 NOTE — Telephone Encounter (Signed)
Omeprazole #90 sent to [facility] 11/22/2018. Confirmed with pharmacist that he has this Rx and will get ready for patient. Hubbard Hartshorn, RN, BSN

## 2018-11-24 NOTE — Telephone Encounter (Signed)
Refill Request  Omeprazole (PRILOSEC) 40 MG capsule [facility] [place], [place] - [address]

## 2018-12-05 ENCOUNTER — Encounter: Payer: Self-pay | Admitting: Internal Medicine

## 2018-12-12 ENCOUNTER — Ambulatory Visit: Payer: Medicare HMO | Admitting: Podiatry

## 2018-12-12 ENCOUNTER — Other Ambulatory Visit: Payer: Self-pay

## 2018-12-12 DIAGNOSIS — J449 Chronic obstructive pulmonary disease, unspecified: Secondary | ICD-10-CM | POA: Diagnosis not present

## 2018-12-12 DIAGNOSIS — M722 Plantar fascial fibromatosis: Secondary | ICD-10-CM | POA: Diagnosis not present

## 2018-12-12 MED ORDER — DICLOFENAC SODIUM 1 % TD GEL: 2.0000 g | Freq: Four times a day (QID) | TRANSDERMAL | 0 refills | Status: DC

## 2018-12-13 ENCOUNTER — Other Ambulatory Visit: Payer: Self-pay | Admitting: Podiatry

## 2018-12-19 ENCOUNTER — Ambulatory Visit: Payer: Medicare HMO | Admitting: Dietician

## 2018-12-19 ENCOUNTER — Encounter: Payer: Self-pay | Admitting: Dietician

## 2018-12-19 NOTE — Patient Instructions (Signed)
Hi Mr. Fedora,   We talked about doing the following to see if it would help stabilize/lower your blood sugars:   1- eat 1 banana per day- 1/2 banana in PM and 1/2 in PM 2- eat bigger portions of vegetables and smaller portions of starchy foods like rice, potatoes, bread,crackers and pasta    A- Try to put two vegetable son the table for meals    B- you may have to ask your wife to buy more vegetables to do this  3- Try a new bottle of insulin - be sure to write the date you opened it on the bottle or box or your calendar so you know when to throw it away in 30 days.   4- Drink more water even it feels like you are peeing too much.   Hope this helps get your blood sugars back under excellent control.  Call anytime!  Butch Penny [phone]

## 2018-12-19 NOTE — Progress Notes (Signed)
Diabetes Self-Management Education  Visit Type:    Telephone visit due to COVID-19. This is a telephone encounter between Jesus Martin and Jesus Martin on 12/19/2018 for nutrition and diabetes follow up. The visit was conducted with the patient located at home and Jesus Martin at [facility]. The patient's identity was confirmed using their DOB and current address. The patient has consented to being evaluated through a telephone encounter and understands the associated risks / benefits (allows the patient to remain at home, decreasing exposure to coronavirus).  Appt. Start Time: 1425 Appt. End [id number]  12/19/2018  Jesus Martin  is a 58 y.o. male with a diagnosis of Diabetes:  Type 2 diabetes ASSESSMENT He reports his blood pressure has been good- "sometimes up to 100" 93/80 He is concerned his diabetes has gone crazy and he is still feeling weak midday. He has not checked his blood sugar differently. He checks it twice a day before injecting his insulin.    Appetite-he says he has been eating a lot of carbohydrates, spaghetti, noodles, potatoes,  Bananas x2;  little oranges x2 Fluids-  Plenty of Water, one cup of coffee with sugar free creamer during the day, Blood sugars- 170-250s, lowest 121 on 5/2 ibn the eveing and the highest 401, since last month. 160-170s, 262/259/277/252 Insulin-metformin 1000 mg twice a day, Novolog mix 70/30 twice a day- 15 units twice a day   Signs of hypoglycemia- no Symptoms of hyperglycemia- yes; drinking more, urinating more, vision  Lab Results  Component Value Date   HGBA1C 5.6 09/26/2018     Diabetes Self-Management Education - 49/44/96 7591      Complications   How often do you check your blood sugar?  1-2 times/day    Fasting Blood glucose range (mg/dL)  130-179    Postprandial Blood glucose range (mg/dL)  70-129;180-200      Patient Education   Monitoring  Taught/discussed recording of test results and interpretation of SMBG.      Individualized Goals (developed by patient)   Nutrition  Follow meal plan discussed      Outcomes   Program Status  Completed      Subsequent Visit   Since your last visit have you continued or begun to take your medications as prescribed?  Yes    Since your last visit have you had your blood pressure checked?  Yes    Is your most recent blood pressure lower, unchanged, or higher since your last visit?  Lower    Since your last visit, are you checking your blood glucose at least once a day?  Yes       Learning Objective:  Patient will have a greater understanding of diabetes self-management. Patient education plan is to attend individual and/or group sessions per assessed needs and concerns.   Plan:   Patient Instructions  Hi Jesus Martin,   We talked about doing the following to see if it would help stabilize/lower your blood sugars:   1- eat 1 banana per day- 1/2 banana in PM and 1/2 in PM 2- eat bigger portions of vegetables and smaller portions of starchy foods like rice, potatoes, bread,crackers and pasta    A- Try to put two vegetable son the table for meals    B- you may have to ask your wife to buy more vegetables to do this  3- Try a new bottle of insulin - be sure to write the date you opened it on the bottle or box or your calendar  so you know when to throw it away in 30 days.   4- Drink more water even it feels like you are peeing too much.   Hope this helps get your blood sugars back under excellent control.  Call anytime!  Jesus Penny [phone]  Expected Outcomes:  Demonstrated interest in learning. Expect positive outcomes Education material provided:  After visit summary If problems or questions, patient to contact team via:  Phone  Future DSME appointment: - 4-6 wks Jesus Martin, RD 12/19/2018 2:31 PM.

## 2018-12-20 NOTE — Progress Notes (Addendum)
Subjective: 58 year old male presents the office today for follow-up evaluation of bilateral heel pain.  States that overall the pain is improved however he still gets discomfort from the heels.  He states his feet hurt more is on them.  He denies any numbness or tingling.  No recent injury. Since I last saw him he was admitted to the hospital for flu/pneumonia. Denies any systemic complaints such as fevers, chills, nausea, vomiting. No acute changes since last appointment, and no other complaints at this time.   Objective: AAO x3, NAD DP/PT pulses palpable bilaterally, CRT less than 3 seconds There is improved but yet continued tenderness palpation of the plantar medial tubercle of the calcaneus at the insertion of plantar fascial bilaterally.  Plantar fascial appears to be intact.  Equinus is present.  There is no pain with lateral compression of calcaneus.  No pain on the course or insertion of the Achilles tendon.  No edema, erythema. No open lesions or pre-ulcerative lesions.  No pain with calf compression, swelling, warmth, erythema  Assessment: Bilateral heel pain, plantar fasciitis  Plan: -All treatment options discussed with the patient including all alternatives, risks, complications.  -Night splint dispensed. Continue plan fascial braces.  Discussed steroid injection but he wishes to hold off today. Discussed inserts for his shoes. We discussed Powersteps vs. Custom. He will be going on vacation in July and he was to come in for them for injections as needed. -Power steps dispensed.  -Patient encouraged to call the office with any questions, concerns, change in symptoms.   Trula Slade DPM

## 2018-12-26 ENCOUNTER — Other Ambulatory Visit: Payer: Self-pay

## 2018-12-26 ENCOUNTER — Encounter: Payer: Medicare HMO | Admitting: Internal Medicine

## 2018-12-30 ENCOUNTER — Encounter: Payer: Self-pay | Admitting: *Deleted

## 2018-12-30 DIAGNOSIS — H5203 Hypermetropia, bilateral: Secondary | ICD-10-CM | POA: Diagnosis not present

## 2018-12-30 DIAGNOSIS — H2513 Age-related nuclear cataract, bilateral: Secondary | ICD-10-CM | POA: Diagnosis not present

## 2018-12-30 DIAGNOSIS — E119 Type 2 diabetes mellitus without complications: Secondary | ICD-10-CM | POA: Diagnosis not present

## 2018-12-30 LAB — HM DIABETES EYE EXAM

## 2019-01-03 ENCOUNTER — Other Ambulatory Visit: Payer: Self-pay | Admitting: Internal Medicine

## 2019-01-03 DIAGNOSIS — E119 Type 2 diabetes mellitus without complications: Secondary | ICD-10-CM

## 2019-01-04 NOTE — Telephone Encounter (Signed)
refilled 

## 2019-01-08 ENCOUNTER — Other Ambulatory Visit: Payer: Self-pay | Admitting: Internal Medicine

## 2019-01-09 ENCOUNTER — Ambulatory Visit (INDEPENDENT_AMBULATORY_CARE_PROVIDER_SITE_OTHER): Payer: Medicare HMO | Admitting: Internal Medicine

## 2019-01-09 ENCOUNTER — Encounter: Payer: Self-pay | Admitting: Internal Medicine

## 2019-01-09 ENCOUNTER — Other Ambulatory Visit: Payer: Self-pay

## 2019-01-09 VITALS — BP 140/93 | HR 88 | Temp 97.8°F | Ht 73.0 in | Wt 261.7 lb

## 2019-01-09 DIAGNOSIS — R400 Somnolence: Secondary | ICD-10-CM | POA: Diagnosis not present

## 2019-01-09 DIAGNOSIS — E119 Type 2 diabetes mellitus without complications: Secondary | ICD-10-CM | POA: Diagnosis not present

## 2019-01-09 DIAGNOSIS — Z794 Long term (current) use of insulin: Secondary | ICD-10-CM | POA: Diagnosis not present

## 2019-01-09 DIAGNOSIS — F3170 Bipolar disorder, currently in remission, most recent episode unspecified: Secondary | ICD-10-CM

## 2019-01-09 DIAGNOSIS — F17221 Nicotine dependence, chewing tobacco, in remission: Secondary | ICD-10-CM | POA: Diagnosis not present

## 2019-01-09 LAB — GLUCOSE, CAPILLARY: Glucose-Capillary: 85 mg/dL (ref 70–99)

## 2019-01-09 LAB — POCT GLYCOSYLATED HEMOGLOBIN (HGB A1C): Hemoglobin A1C: 6.5 % — AB (ref 4.0–5.6)

## 2019-01-09 NOTE — Progress Notes (Signed)
CC: T2DM, Bipolar Disorder, Serum calcium elevated, Daytime somnolence  HPI:  Jesus Martin is a 58 y.o. male with PMH below.  Today we will address T2DM, Bipolar Disorder, Serum calcium elevated, Daytime somnolence  Please see A&P for status of the patient's chronic medical conditions  Past Medical History:  Diagnosis Date  . Bipolar I disorder (HCC) 1991   Dr. Toy Care  . Borderline hyperlipidemia    history of  . Childhood asthma   . COPD (chronic obstructive pulmonary disease) ([place])   . Diabetes mellitus, new onset ([place]) 12/22/2017  . Elevated glucose   . Fibromyalgia   . GERD (gastroesophageal reflux disease)   . HTN (hypertension)   . Low vitamin B12 level 06/07/2018  . Lower back pain   . Paranoia ([place])   . Tobacco abuse   . Tobacco abuse   . Vitamin D deficiency    Review of Systems:  ROS: Pulmonary: pt denies increased work of breathing, shortness of breath,  Cardiac: pt denies palpitations, chest pain,  Abdominal: pt denies abdominal pain, nausea, vomiting, or diarrhea  Physical Exam:  Vitals:   01/09/19 1348  BP: (!) 140/93  Pulse: 88  Temp: 97.8 F (36.6 C)  TempSrc: Oral  SpO2: 98%  Weight: 261 lb 11.2 oz (118.7 kg)  Height: 6' 1"  (1.854 m)   Cardiac: normal rate and rhythm, clear s1 and s2, no murmurs, rubs or gallops Pulmonary: CTAB, not in distress Extremities: no LE edema Psych: Alert, conversant, flat affect   Social History   Socioeconomic History  . Marital status: Married    Spouse name: Not on file  . Number of children: Not on file  . Years of education: Not on file  . Highest education level: Not on file  Occupational History  . Not on file  Social Needs  . Financial resource strain: Not on file  . Food insecurity:    Worry: Not on file    Inability: Not on file  . Transportation needs:    Medical: Not on file    Non-medical: Not on file  Tobacco Use  . Smoking status: Former Smoker    Packs/day: 0.30    Years:  45.00    Pack years: 13.50    Types: Cigarettes    Last attempt to quit: 07/10/2017    Years since quitting: 1.5  . Smokeless tobacco: Current User    Types: Snuff  . Tobacco comment: Quit 2 weeks ago.  Substance and Sexual Activity  . Alcohol use: Yes    Comment: Beer twice a week.  . Drug use: No  . Sexual activity: Not on file  Lifestyle  . Physical activity:    Days per week: Patient refused    Minutes per session: Patient refused  . Stress: Not on file  Relationships  . Social connections:    Talks on phone: Patient refused    Gets together: Patient refused    Attends religious service: Patient refused    Active member of club or organization: Patient refused    Attends meetings of clubs or organizations: Patient refused    Relationship status: Patient refused  . Intimate partner violence:    Fear of current or ex partner: Patient refused    Emotionally abused: Patient refused    Physically abused: Patient refused    Forced sexual activity: Patient refused  Other Topics Concern  . Not on file  Social History Narrative   Lives with wife Antinio Sanderfer (married 71  years) and son. Has 4 dogs and fish. Not employed. Woodworking for fun. HS graduate.       Quit smoking in 2008. 26 pack years. No recreational drug use. Drinks 1 glass of wine per month. Does not exercise.     Family History  Adopted: Yes    Assessment & Plan:   See Encounters Tab for problem based charting.  Patient discussed with Dr. Daryll Drown

## 2019-01-09 NOTE — Patient Instructions (Signed)
Jesus Martin, we will order some labs today as we discussed check your thyroid function, calcium, metabolic panel and complete blood count.  Keep up the good work with your diabetes.  Please return in about 3 months for follow up.

## 2019-01-09 NOTE — Telephone Encounter (Signed)
refilled 

## 2019-01-10 ENCOUNTER — Encounter: Payer: Self-pay | Admitting: Internal Medicine

## 2019-01-10 DIAGNOSIS — R400 Somnolence: Secondary | ICD-10-CM | POA: Insufficient documentation

## 2019-01-10 LAB — CMP14 + ANION GAP
ALT: 43 IU/L (ref 0–44)
AST: 31 IU/L (ref 0–40)
Albumin/Globulin Ratio: 2 (ref 1.2–2.2)
Albumin: 4.9 g/dL (ref 3.8–4.9)
Alkaline Phosphatase: 78 IU/L (ref 39–117)
Anion Gap: 17 mmol/L (ref 10.0–18.0)
BUN/Creatinine Ratio: 10 (ref 9–20)
BUN: 11 mg/dL (ref 6–24)
Bilirubin Total: 0.5 mg/dL (ref 0.0–1.2)
CO2: 23 mmol/L (ref 20–29)
Calcium: 10 mg/dL (ref 8.7–10.2)
Chloride: 102 mmol/L (ref 96–106)
Creatinine, Ser: 1.06 mg/dL (ref 0.76–1.27)
GFR calc Af Amer: 90 mL/min/{1.73_m2} (ref >59)
GFR calc non Af Amer: 78 mL/min/{1.73_m2} (ref >59)
Globulin, Total: 2.5 g/dL (ref 1.5–4.5)
Glucose: 93 mg/dL (ref 65–99)
Potassium: 4.3 mmol/L (ref 3.5–5.2)
Sodium: 142 mmol/L (ref 134–144)
Total Protein: 7.4 g/dL (ref 6.0–8.5)

## 2019-01-10 LAB — CBC
Hematocrit: 42.4 % (ref 37.5–51.0)
Hemoglobin: 14.5 g/dL (ref 13.0–17.7)
MCH: 31.9 pg (ref 26.6–33.0)
MCHC: 34.2 g/dL (ref 31.5–35.7)
MCV: 93 fL (ref 79–97)
Platelets: 237 10*3/uL (ref 150–450)
RBC: 4.55 x10E6/uL (ref 4.14–5.80)
RDW: 12.4 % (ref 11.6–15.4)
WBC: 9 10*3/uL (ref 3.4–10.8)

## 2019-01-10 LAB — MICROALBUMIN / CREATININE URINE RATIO
Creatinine, Urine: 33 mg/dL
Microalb/Creat Ratio: 19 mg/g creat (ref 0–29)
Microalbumin, Urine: 6.2 ug/mL

## 2019-01-10 LAB — CALCIUM, IONIZED: Calcium, Ionized, Serum: 5.2 mg/dL (ref 4.5–5.6)

## 2019-01-10 LAB — TSH: TSH: 1.98 u[IU]/mL (ref 0.450–4.500)

## 2019-01-10 NOTE — Progress Notes (Signed)
Internal Medicine Clinic Attending  Case discussed with Dr. Winfrey  at the time of the visit.  We reviewed the resident's history and exam and pertinent patient test results.  I agree with the assessment, diagnosis, and plan of care documented in the resident's note.  

## 2019-01-10 NOTE — Assessment & Plan Note (Signed)
Last labs showed an elevated serum calcium.   -check ionized calcium and alk phos today

## 2019-01-10 NOTE — Assessment & Plan Note (Signed)
Feels he has never really recovered from his hospital stay.  Feels like he has been very sleepy during the day and very little energy.  He says he overall sleeps well but snores loudly.  He does not want OSA evaluation.    -Will workup with TSH, CMP, CBC, ionized calcium -If labs return all normal will discuss with patient prescribing some physical therapy.

## 2019-01-10 NOTE — Assessment & Plan Note (Signed)
Lab Results  Component Value Date   HGBA1C 6.5 (A) 01/09/2019   Discussed with patient again about coming off Insulin at the request of my colleagues and our [place].  Patient was adamant that he will not come off insulin.  He is very regimented in his schedule and checks his cbg religiously.  Keeps a detailed log.    -Continue metformin 1093m BID -Continue 70/30 15u BID

## 2019-01-10 NOTE — Assessment & Plan Note (Signed)
Mood is stable, no issues.  Sees Dr. Toy Care. He will need a copy of labs from today.    -Will check TSH, CBC and CMP today for monitoring

## 2019-01-12 DIAGNOSIS — J449 Chronic obstructive pulmonary disease, unspecified: Secondary | ICD-10-CM | POA: Diagnosis not present

## 2019-01-17 ENCOUNTER — Ambulatory Visit: Payer: Medicare HMO | Admitting: Dietician

## 2019-01-17 ENCOUNTER — Other Ambulatory Visit: Payer: Self-pay

## 2019-01-17 NOTE — Patient Instructions (Signed)
Dear Jesus Martin,  It sounds like you are doing a great job self managing your diabetes! You are not alone in that meal planning is the hardest part for many people with diabetes. Changing habits is hard work and takes time.   Writing down what we eat and drink once in a while can help [place] to see it in a different way. What we eat and drink are habits formed during our lifetime. Habits are learned and can be unlearned and changed.   Here is an example of a way to record food intake in case you think it might work for you. You can also add another column to tally carbohydrates.  I have enclosed a handout on carb counting as a tool to control blood sugars  for reference.   For example:  Time  What   How much        Carbs 8 AM  Cooked oatmeal 1 Cup  30 grams   Coffee   1 mug                         Sugar in oatmeal 1 teaspoon 4 grams         Sugar in coffee 2 teaspoons 8 grams   Milk in oatmeal  1/4 cup 3 grams   Creamer in coffee  2 teaspoons   Margarine in oatmeal 1 teaspoon

## 2019-01-17 NOTE — Progress Notes (Signed)
Diabetes Self-Management Education  Visit Type:  Follow-up Telephone visit due to COVID-19. This is a telephone encounter between Jesus Martin and Jesus Martin  on 01/17/2019 for nutrition and diabetes follow up. The visit was conducted with the patient located at home and Jesus Martin at [facility]. The patient's identity was confirmed using their DOB and current address. The patient has consented to being evaluated through a telephone encounter and understands the associated risks / benefits (allows the patient to remain at home, decreasing exposure to coronavirus).  Appt. Start Time: 1318 Appt. End XNTZ:0017  01/17/2019  Jesus Martin  is a 58 y.o. male with a diagnosis of Diabetes:  Type 2 diabetes ASSESSMENT He reports his diabetes numbers have improved.  He has been cutting down on his portions of carbs and finds this very difficult. He gained 13# over the past 3 months.  Appetite-he has cut back on carbs.   Fluids-  2 cases per week of water, one cup of coffee with sugar free creamer during the day Blood sugars- 150-200s, since 5/17  lowest- 132  highest- 230 Insulin-metformin 1000 mg twice a day, 15 units Novolog mix 70/30 from vial twice a day- needs rx for syringes. Signs of hypoglycemia- no problems Symptoms of hyperglycemia- has the following, but not sure it is due to high blood sugars - peeing a lot, drank a lot, feeling weak, shakes he thinks is due to his bipolar  Lab Results  Component Value Date   HGBA1C 6.5 (A) 01/09/2019     Diabetes Self-Management Education - 49/44/96 7591      Complications   Last HgB A1C per patient/outside source  6.5 %   increased from 5.6%   How often do you check your blood sugar?  1-2 times/day    Postprandial Blood glucose range (mg/dL)  70-129;130-179    Number of hypoglycemic episodes per month  0    Number of hyperglycemic episodes per week  0    Have you had a dilated eye exam in the past 12 months?  Yes    Have you had a dental exam  in the past 12 months?  --    Are you checking your feet?  Yes   sees podiatry   How many days per week are you checking your feet?  7      Dietary Intake   Beverage(s)  drinks mostly water      Exercise   Exercise Type  ADL's;Light (walking / raking leaves)      Patient Education   Acute complications  Taught treatment of hypoglycemia - the 15 rule.;Discussed and identified patients' treatment of hyperglycemia.      Individualized Goals (developed by patient)   Nutrition  Follow meal plan discussed      Patient Self-Evaluation of Goals - Patient rates self as meeting previously set goals (% of time)   Medications  >75%      Outcomes   Program Status  Completed      Subsequent Visit   Since your last visit have you continued or begun to take your medications as prescribed?  Yes    Since your last visit have you had your blood pressure checked?  Yes    Is your most recent blood pressure lower, unchanged, or higher since your last visit?  Unchanged    Since your last visit have you experienced any weight changes?  Gain    Weight Gain (lbs)  13    Since your last visit,  are you checking your blood glucose at least once a day?  Yes     Learning Objective:  Patient will have a greater understanding of diabetes self-management. Patient education plan is to attend individual and/or group sessions per assessed needs and concerns.   Plan:   There are no Patient Instructions on file for this visit.   Expected Outcomes:  Demonstrated interest in learning. Expect positive outcomes  Education material provided:  After visit summary If problems or questions, patient to contact team via:  Phone  Future DSME appointment: - 2 months  Jesus Martin, RD 01/17/2019 1:53 PM.

## 2019-01-18 ENCOUNTER — Other Ambulatory Visit: Payer: Self-pay | Admitting: Dietician

## 2019-01-18 DIAGNOSIS — E119 Type 2 diabetes mellitus without complications: Secondary | ICD-10-CM

## 2019-01-18 MED ORDER — "INSULIN SYRINGE-NEEDLE U-100 31G X 15/64"" 0.3 ML MISC": 3 refills | Status: DC

## 2019-01-18 NOTE — Telephone Encounter (Signed)
Mr. Farrelly requests insulin syringe prescription.

## 2019-01-20 NOTE — Telephone Encounter (Signed)
Done by Dr. Evette Doffing.

## 2019-01-23 ENCOUNTER — Encounter: Payer: Self-pay | Admitting: Internal Medicine

## 2019-01-24 DIAGNOSIS — J449 Chronic obstructive pulmonary disease, unspecified: Secondary | ICD-10-CM | POA: Diagnosis not present

## 2019-01-29 ENCOUNTER — Other Ambulatory Visit: Payer: Self-pay

## 2019-01-29 ENCOUNTER — Observation Stay (HOSPITAL_COMMUNITY)
Admission: EM | Admit: 2019-01-29 | Discharge: 2019-01-30 | Disposition: A | Discharge status: Home / Self Care | Payer: Medicare HMO | Attending: Internal Medicine | Admitting: Internal Medicine

## 2019-01-29 ENCOUNTER — Emergency Department (HOSPITAL_COMMUNITY): Payer: Medicare HMO

## 2019-01-29 DIAGNOSIS — R52 Pain, unspecified: Secondary | ICD-10-CM | POA: Diagnosis present

## 2019-01-29 DIAGNOSIS — E785 Hyperlipidemia, unspecified: Secondary | ICD-10-CM | POA: Diagnosis not present

## 2019-01-29 DIAGNOSIS — S32010A Wedge compression fracture of first lumbar vertebra, initial encounter for closed fracture: Secondary | ICD-10-CM

## 2019-01-29 DIAGNOSIS — Z79899 Other long term (current) drug therapy: Secondary | ICD-10-CM | POA: Insufficient documentation

## 2019-01-29 DIAGNOSIS — F319 Bipolar disorder, unspecified: Secondary | ICD-10-CM | POA: Diagnosis not present

## 2019-01-29 DIAGNOSIS — M797 Fibromyalgia: Secondary | ICD-10-CM | POA: Insufficient documentation

## 2019-01-29 DIAGNOSIS — Z794 Long term (current) use of insulin: Secondary | ICD-10-CM | POA: Diagnosis not present

## 2019-01-29 DIAGNOSIS — K219 Gastro-esophageal reflux disease without esophagitis: Secondary | ICD-10-CM | POA: Diagnosis not present

## 2019-01-29 DIAGNOSIS — J449 Chronic obstructive pulmonary disease, unspecified: Secondary | ICD-10-CM | POA: Insufficient documentation

## 2019-01-29 DIAGNOSIS — K760 Fatty (change of) liver, not elsewhere classified: Secondary | ICD-10-CM | POA: Diagnosis not present

## 2019-01-29 DIAGNOSIS — S32019A Unspecified fracture of first lumbar vertebra, initial encounter for closed fracture: Secondary | ICD-10-CM | POA: Diagnosis not present

## 2019-01-29 DIAGNOSIS — F17290 Nicotine dependence, other tobacco product, uncomplicated: Secondary | ICD-10-CM | POA: Diagnosis not present

## 2019-01-29 DIAGNOSIS — M25561 Pain in right knee: Secondary | ICD-10-CM | POA: Insufficient documentation

## 2019-01-29 DIAGNOSIS — R2689 Other abnormalities of gait and mobility: Secondary | ICD-10-CM | POA: Insufficient documentation

## 2019-01-29 DIAGNOSIS — E1140 Type 2 diabetes mellitus with diabetic neuropathy, unspecified: Secondary | ICD-10-CM | POA: Insufficient documentation

## 2019-01-29 DIAGNOSIS — S299XXA Unspecified injury of thorax, initial encounter: Secondary | ICD-10-CM | POA: Diagnosis not present

## 2019-01-29 DIAGNOSIS — S3991XA Unspecified injury of abdomen, initial encounter: Secondary | ICD-10-CM | POA: Diagnosis not present

## 2019-01-29 DIAGNOSIS — Z03818 Encounter for observation for suspected exposure to other biological agents ruled out: Secondary | ICD-10-CM | POA: Diagnosis not present

## 2019-01-29 DIAGNOSIS — S32009A Unspecified fracture of unspecified lumbar vertebra, initial encounter for closed fracture: Secondary | ICD-10-CM

## 2019-01-29 DIAGNOSIS — M5126 Other intervertebral disc displacement, lumbar region: Secondary | ICD-10-CM | POA: Diagnosis not present

## 2019-01-29 DIAGNOSIS — Z1159 Encounter for screening for other viral diseases: Secondary | ICD-10-CM | POA: Diagnosis not present

## 2019-01-29 DIAGNOSIS — Z791 Long term (current) use of non-steroidal anti-inflammatories (NSAID): Secondary | ICD-10-CM | POA: Diagnosis not present

## 2019-01-29 DIAGNOSIS — S3993XA Unspecified injury of pelvis, initial encounter: Secondary | ICD-10-CM | POA: Diagnosis not present

## 2019-01-29 DIAGNOSIS — M4856XA Collapsed vertebra, not elsewhere classified, lumbar region, initial encounter for fracture: Secondary | ICD-10-CM | POA: Diagnosis not present

## 2019-01-29 DIAGNOSIS — W1789XA Other fall from one level to another, initial encounter: Secondary | ICD-10-CM | POA: Insufficient documentation

## 2019-01-29 DIAGNOSIS — S8991XA Unspecified injury of right lower leg, initial encounter: Secondary | ICD-10-CM | POA: Diagnosis not present

## 2019-01-29 DIAGNOSIS — M4850XA Collapsed vertebra, not elsewhere classified, site unspecified, initial encounter for fracture: Secondary | ICD-10-CM | POA: Diagnosis present

## 2019-01-29 DIAGNOSIS — I10 Essential (primary) hypertension: Secondary | ICD-10-CM | POA: Diagnosis not present

## 2019-01-29 DIAGNOSIS — S3992XA Unspecified injury of lower back, initial encounter: Secondary | ICD-10-CM | POA: Diagnosis not present

## 2019-01-29 HISTORY — DX: Unspecified fracture of unspecified lumbar vertebra, initial encounter for closed fracture: S32.009A

## 2019-01-29 LAB — COMPREHENSIVE METABOLIC PANEL
ALT: 64 U/L — ABNORMAL HIGH (ref 0–44)
AST: 42 U/L — ABNORMAL HIGH (ref 15–41)
Albumin: 4.1 g/dL (ref 3.5–5.0)
Alkaline Phosphatase: 69 U/L (ref 38–126)
Anion gap: 10 (ref 5–15)
BUN: 10 mg/dL (ref 6–20)
CO2: 24 mmol/L (ref 22–32)
Calcium: 9.5 mg/dL (ref 8.9–10.3)
Chloride: 106 mmol/L (ref 98–111)
Creatinine, Ser: 0.98 mg/dL (ref 0.61–1.24)
GFR calc Af Amer: >60 mL/min (ref >60)
GFR calc non Af Amer: >60 mL/min (ref >60)
Glucose, Bld: 173 mg/dL — ABNORMAL HIGH (ref 70–99)
Potassium: 3.7 mmol/L (ref 3.5–5.1)
Sodium: 140 mmol/L (ref 135–145)
Total Bilirubin: 0.9 mg/dL (ref 0.3–1.2)
Total Protein: 7.1 g/dL (ref 6.5–8.1)

## 2019-01-29 LAB — CBC
HCT: 40.4 % (ref 39.0–52.0)
Hemoglobin: 13.4 g/dL (ref 13.0–17.0)
MCH: 31 pg (ref 26.0–34.0)
MCHC: 33.2 g/dL (ref 30.0–36.0)
MCV: 93.5 fL (ref 80.0–100.0)
Platelets: 206 10*3/uL (ref 150–400)
RBC: 4.32 MIL/uL (ref 4.22–5.81)
RDW: 12 % (ref 11.5–15.5)
WBC: 14.9 10*3/uL — ABNORMAL HIGH (ref 4.0–10.5)
nRBC: 0 % (ref 0.0–0.2)

## 2019-01-29 MED ORDER — SODIUM CHLORIDE 0.9 % IV BOLUS
1000.0000 mL | Freq: Once | INTRAVENOUS | Status: AC
  Administered 2019-01-29: 1000 mL via INTRAVENOUS

## 2019-01-29 MED ORDER — FENTANYL CITRATE (PF) 100 MCG/2ML IJ SOLN
50.0000 ug | Freq: Once | INTRAMUSCULAR | Status: AC
  Administered 2019-01-29: 50 ug via INTRAVENOUS
  Filled 2019-01-29: qty 2

## 2019-01-29 MED ORDER — IOHEXOL 300 MG/ML  SOLN
100.0000 mL | Freq: Once | INTRAMUSCULAR | Status: AC | PRN
  Administered 2019-01-29: 100 mL via INTRAVENOUS

## 2019-01-29 NOTE — ED Provider Notes (Signed)
11:06 PM  Assumed care from Dr. Ronnald Nian.  Patient is a 58 y.o. M who fell off riding mower today complaining of knee pain and LBP.  No neuro deficits.  CTs pending to rule out traumatic injury.  11:50 PM  Spoke with Dr. Ellene Route with neurosurgery.  He recommends TLSO brace and outpatient follow-up.  We have offered patient admission for pain control which he has declined.  Will ambulate patient once TLSO brace is in place.  1:31 AM  Patient has TLSO brace in place.  He still reports significant pain with trying to stand and ambulate.  He can sit upright in the bed but cannot walk because of pain.  Will give dose of IV Dilaudid.  Have offered him admission again for pain control which he has declined.  He would like to try stronger IV pain medication and then attempt to ambulate again.  3:17 AM  Pt unable to walk only a few feet without significant pain.  Have offered him admission and now he accepts.  PCP is with internal medicine.  Will give second dose of IV Dilaudid.  3:23 AM Discussed patient's case with IM resident, Dr. Shan Levans.  I have recommended admission and patient (and family if present) agree with this plan. Admitting physician will place admission orders.   I reviewed all nursing notes, vitals, pertinent previous records, EKGs, lab and urine results, imaging (as available).     Averlee Swartz, Delice Bison, DO 01/30/19 [phone]

## 2019-01-29 NOTE — ED Notes (Signed)
Patient transported to CT 

## 2019-01-29 NOTE — ED Triage Notes (Signed)
Patient states that he fell off of his lawnmower and then the lawnmower turned over and landed on his right leg. C/o back and right leg pain; denies LOC.
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
## 2019-01-30 ENCOUNTER — Observation Stay (HOSPITAL_COMMUNITY): Payer: Medicare HMO

## 2019-01-30 ENCOUNTER — Encounter (HOSPITAL_COMMUNITY): Payer: Self-pay

## 2019-01-30 DIAGNOSIS — Z91030 Bee allergy status: Secondary | ICD-10-CM

## 2019-01-30 DIAGNOSIS — Z888 Allergy status to other drugs, medicaments and biological substances status: Secondary | ICD-10-CM | POA: Diagnosis not present

## 2019-01-30 DIAGNOSIS — E119 Type 2 diabetes mellitus without complications: Secondary | ICD-10-CM

## 2019-01-30 DIAGNOSIS — M4850XA Collapsed vertebra, not elsewhere classified, site unspecified, initial encounter for fracture: Secondary | ICD-10-CM | POA: Diagnosis present

## 2019-01-30 DIAGNOSIS — S0990XA Unspecified injury of head, initial encounter: Secondary | ICD-10-CM | POA: Diagnosis not present

## 2019-01-30 DIAGNOSIS — Z7951 Long term (current) use of inhaled steroids: Secondary | ICD-10-CM | POA: Diagnosis not present

## 2019-01-30 DIAGNOSIS — F319 Bipolar disorder, unspecified: Secondary | ICD-10-CM

## 2019-01-30 DIAGNOSIS — I10 Essential (primary) hypertension: Secondary | ICD-10-CM

## 2019-01-30 DIAGNOSIS — J449 Chronic obstructive pulmonary disease, unspecified: Secondary | ICD-10-CM | POA: Diagnosis not present

## 2019-01-30 DIAGNOSIS — S32010A Wedge compression fracture of first lumbar vertebra, initial encounter for closed fracture: Secondary | ICD-10-CM | POA: Diagnosis not present

## 2019-01-30 DIAGNOSIS — S32019A Unspecified fracture of first lumbar vertebra, initial encounter for closed fracture: Secondary | ICD-10-CM

## 2019-01-30 DIAGNOSIS — W3089XA Contact with other specified agricultural machinery, initial encounter: Secondary | ICD-10-CM

## 2019-01-30 DIAGNOSIS — Z79899 Other long term (current) drug therapy: Secondary | ICD-10-CM | POA: Diagnosis not present

## 2019-01-30 LAB — GLUCOSE, CAPILLARY: Glucose-Capillary: 176 mg/dL — ABNORMAL HIGH (ref 70–99)

## 2019-01-30 LAB — CBG MONITORING, ED: Glucose-Capillary: 161 mg/dL — ABNORMAL HIGH (ref 70–99)

## 2019-01-30 MED ORDER — HYDROCODONE-ACETAMINOPHEN 7.5-325 MG/15ML PO SOLN
10.0000 mL | ORAL | Status: DC | PRN
  Administered 2019-01-30: 10 mL via ORAL
  Filled 2019-01-30: qty 15

## 2019-01-30 MED ORDER — LITHIUM CARBONATE ER 450 MG PO TBCR: 450.0000 mg | EXTENDED_RELEASE_TABLET | ORAL | Status: DC

## 2019-01-30 MED ORDER — ONDANSETRON HCL 4 MG/2ML IJ SOLN
4.0000 mg | Freq: Once | INTRAMUSCULAR | Status: AC
  Administered 2019-01-30: 4 mg via INTRAVENOUS
  Filled 2019-01-30: qty 2

## 2019-01-30 MED ORDER — INSULIN ASPART PROT & ASPART (70-30 MIX) 100 UNIT/ML ~~LOC~~ SUSP
12.0000 [IU] | Freq: Two times a day (BID) | SUBCUTANEOUS | Status: DC
  Filled 2019-01-30: qty 10

## 2019-01-30 MED ORDER — OXYCODONE-ACETAMINOPHEN 5-325 MG PO TABS: 1.0000 | ORAL_TABLET | Freq: Three times a day (TID) | ORAL | 0 refills | Status: DC | PRN

## 2019-01-30 MED ORDER — LITHIUM CARBONATE ER 450 MG PO TBCR
675.0000 mg | EXTENDED_RELEASE_TABLET | Freq: Every day | ORAL | Status: DC
  Filled 2019-01-30: qty 1.5

## 2019-01-30 MED ORDER — QUETIAPINE FUMARATE 25 MG PO TABS
225.0000 mg | ORAL_TABLET | Freq: Every day | ORAL | Status: DC
  Filled 2019-01-30: qty 1

## 2019-01-30 MED ORDER — HYDROMORPHONE HCL 1 MG/ML IJ SOLN
1.0000 mg | Freq: Once | INTRAMUSCULAR | Status: AC
  Administered 2019-01-30: 1 mg via INTRAVENOUS
  Filled 2019-01-30: qty 1

## 2019-01-30 MED ORDER — HYDROMORPHONE HCL 1 MG/ML IJ SOLN
1.0000 mg | INTRAMUSCULAR | Status: DC | PRN
  Administered 2019-01-30: 1 mg via INTRAVENOUS
  Filled 2019-01-30: qty 1

## 2019-01-30 MED ORDER — ALPRAZOLAM 0.5 MG PO TABS: 1.0000 mg | ORAL_TABLET | Freq: Two times a day (BID) | ORAL | Status: DC | PRN

## 2019-01-30 MED ORDER — FLUTICASONE-UMECLIDIN-VILANT 100-62.5-25 MCG/INH IN AEPB: 1.0000 | INHALATION_SPRAY | Freq: Every day | RESPIRATORY_TRACT | Status: DC

## 2019-01-30 MED ORDER — FLUTICASONE FUROATE-VILANTEROL 100-25 MCG/INH IN AEPB
1.0000 | INHALATION_SPRAY | Freq: Every day | RESPIRATORY_TRACT | Status: DC
  Administered 2019-01-30: 1 via RESPIRATORY_TRACT
  Filled 2019-01-30 (×2): qty 28

## 2019-01-30 MED ORDER — PANTOPRAZOLE SODIUM 40 MG PO TBEC
40.0000 mg | DELAYED_RELEASE_TABLET | Freq: Every day | ORAL | Status: DC
  Administered 2019-01-30: 40 mg via ORAL
  Filled 2019-01-30: qty 1

## 2019-01-30 MED ORDER — OXYCODONE-ACETAMINOPHEN 5-325 MG PO TABS
1.0000 | ORAL_TABLET | Freq: Four times a day (QID) | ORAL | Status: DC | PRN
  Administered 2019-01-30: 1 via ORAL
  Filled 2019-01-30: qty 1

## 2019-01-30 MED ORDER — SENNA 8.6 MG PO TABS
1.0000 | ORAL_TABLET | Freq: Every day | ORAL | Status: DC
  Administered 2019-01-30: 8.6 mg via ORAL
  Filled 2019-01-30: qty 1

## 2019-01-30 MED ORDER — LITHIUM CARBONATE ER 450 MG PO TBCR
675.0000 mg | EXTENDED_RELEASE_TABLET | Freq: Every day | ORAL | Status: DC
  Administered 2019-01-30: 675 mg via ORAL

## 2019-01-30 MED ORDER — IPRATROPIUM-ALBUTEROL 0.5-2.5 (3) MG/3ML IN SOLN: 3.0000 mL | RESPIRATORY_TRACT | Status: DC | PRN

## 2019-01-30 MED ORDER — UMECLIDINIUM BROMIDE 62.5 MCG/INH IN AEPB
1.0000 | INHALATION_SPRAY | Freq: Every day | RESPIRATORY_TRACT | Status: DC
  Administered 2019-01-30: 1 via RESPIRATORY_TRACT
  Filled 2019-01-30 (×2): qty 7

## 2019-01-30 MED ORDER — LITHIUM CARBONATE ER 450 MG PO TBCR
450.0000 mg | EXTENDED_RELEASE_TABLET | Freq: Every day | ORAL | Status: DC
  Filled 2019-01-30: qty 1

## 2019-01-30 MED ORDER — LITHIUM CARBONATE ER 450 MG PO TBCR: 675.0000 mg | EXTENDED_RELEASE_TABLET | Freq: Every day | ORAL | Status: DC

## 2019-01-30 MED ORDER — PREGABALIN 25 MG PO CAPS
150.0000 mg | ORAL_CAPSULE | Freq: Two times a day (BID) | ORAL | Status: DC
  Administered 2019-01-30: 150 mg via ORAL
  Filled 2019-01-30: qty 3

## 2019-01-30 MED ORDER — PROMETHAZINE HCL 25 MG PO TABS: 12.5000 mg | ORAL_TABLET | Freq: Four times a day (QID) | ORAL | Status: DC | PRN

## 2019-01-30 MED ORDER — AMLODIPINE BESYLATE 10 MG PO TABS
10.0000 mg | ORAL_TABLET | Freq: Every day | ORAL | Status: DC
  Administered 2019-01-30: 10 mg via ORAL
  Filled 2019-01-30: qty 2

## 2019-01-30 MED ORDER — INSULIN ASPART PROT & ASPART (70-30 MIX) 100 UNIT/ML ~~LOC~~ SUSP
10.0000 [IU] | Freq: Two times a day (BID) | SUBCUTANEOUS | Status: DC
  Administered 2019-01-30: 10 [IU] via SUBCUTANEOUS
  Filled 2019-01-30: qty 10

## 2019-01-30 MED ORDER — LAMOTRIGINE 100 MG PO TABS
150.0000 mg | ORAL_TABLET | Freq: Two times a day (BID) | ORAL | Status: DC
  Administered 2019-01-30: 150 mg via ORAL
  Filled 2019-01-30 (×3): qty 1

## 2019-01-30 MED ORDER — ENOXAPARIN SODIUM 40 MG/0.4ML ~~LOC~~ SOLN
40.0000 mg | Freq: Every day | SUBCUTANEOUS | Status: DC
  Filled 2019-01-30: qty 0.4

## 2019-01-30 MED ORDER — ATORVASTATIN CALCIUM 40 MG PO TABS: 40.0000 mg | ORAL_TABLET | Freq: Every day | ORAL | Status: DC

## 2019-01-30 NOTE — ED Notes (Signed)
Attempted to ambulate x 2. First attempt was unsuccessful patient could barely tolerate standing up even with use of TLSO brace. Medicated patient and once pain decreased attempted again. This time patient was able to walk out of room to chair and back although with assistance. Patient states very difficult to tolerate.

## 2019-01-30 NOTE — ED Notes (Signed)
ED TO INPATIENT HANDOFF REPORT  ED Nurse Name and Phone #: Serafina Mitchell  S Name/Age/Gender Jesus Martin 58 y.o. male Room/Bed: 053C/053C  Code Status   Code Status: Full Code  Home/SNF/Other Home Patient oriented to: self, place, time and situation Is this baseline? Yes   Triage Complete: Triage complete  Chief Complaint Hit With Lawnmower, Back Pain  Triage Note Patient states that he fell off of his lawnmower and then the lawnmower turned over and landed on his right leg. C/o back and right leg pain; denies LOC.   Allergies Allergies  Allergen Reactions  . Yellow Jacket Venom [Bee Venom] Anaphylaxis and Other (See Comments)    And the patient passes out  . 5-Alpha Reductase Inhibitors     Pt unsure why this is listed    Level of Care/Admitting Diagnosis ED Disposition    ED Disposition Condition [facility] Area: [place] [100100]  Level of Care: Med-Surg [16]  Covid Evaluation: N/A  Diagnosis: Compression fracture of spine [facility]) [[id number]]  Admitting Physician: Aldine Contes [phone]  Attending Physician: Aldine Contes [phone]  PT Class (Do Not Modify): Observation [104]  PT Acc Code (Do Not Modify): Observation [10022]       B Medical/Surgery History Past Medical History:  Diagnosis Date  . Bipolar I disorder (HCC) 1991   Dr. Toy Care  . Borderline hyperlipidemia    history of  . Childhood asthma   . COPD (chronic obstructive pulmonary disease) ([place])   . Diabetes mellitus, new onset ([place]) 12/22/2017  . Elevated glucose   . Fibromyalgia   . GERD (gastroesophageal reflux disease)   . HTN (hypertension)   . Low vitamin B12 level 06/07/2018  . Lower back pain   . Paranoia ([place])   . Tobacco abuse   . Tobacco abuse   . Vitamin D deficiency    Past Surgical History:  Procedure Laterality Date  . APPENDECTOMY     1975  . BACK SURGERY     1998  . NOSE SURGERY       A IV Location/Drains/Wounds Patient  Lines/Drains/Airways Status   Active Line/Drains/Airways    Name:   Placement date:   Placement time:   Site:   Days:   Peripheral IV 01/29/19 Right Antecubital   01/29/19    2000    Antecubital   1          Intake/Output Last 24 hours  Intake/Output Summary (Last 24 hours) at 01/30/2019 1440 Last data filed at 01/30/2019 0443 Gross per 24 hour  Intake -  Output 2460 ml  Net -2460 ml    Labs/Imaging Results for orders placed or performed during the hospital encounter of 01/29/19 (from the past 48 hour(s))  Comprehensive metabolic panel     Status: Abnormal   Collection Time: 01/29/19  9:21 PM  Result Value Ref Range   Sodium 140 135 - 145 mmol/L   Potassium 3.7 3.5 - 5.1 mmol/L   Chloride 106 98 - 111 mmol/L   CO2 24 22 - 32 mmol/L   Glucose, Bld 173 (H) 70 - 99 mg/dL   BUN 10 6 - 20 mg/dL   Creatinine, Ser 0.98 0.61 - 1.24 mg/dL   Calcium 9.5 8.9 - 10.3 mg/dL   Total Protein 7.1 6.5 - 8.1 g/dL   Albumin 4.1 3.5 - 5.0 g/dL   AST 42 (H) 15 - 41 U/L   ALT 64 (H) 0 - 44 U/L  Alkaline Phosphatase 69 38 - 126 U/L   Total Bilirubin 0.9 0.3 - 1.2 mg/dL   GFR calc non Af Amer >60 >60 mL/min   GFR calc Af Amer >60 >60 mL/min   Anion gap 10 5 - 15    Comment: Performed at [facility] [address] [place], [place] [address]  CBC     Status: Abnormal   Collection Time: 01/29/19  9:21 PM  Result Value Ref Range   WBC 14.9 (H) 4.0 - 10.5 K/uL   RBC 4.32 4.22 - 5.81 MIL/uL   Hemoglobin 13.4 13.0 - 17.0 g/dL   HCT 40.4 39.0 - 52.0 %   MCV 93.5 80.0 - 100.0 fL   MCH 31.0 26.0 - 34.0 pg   MCHC 33.2 30.0 - 36.0 g/dL   RDW 12.0 11.5 - 15.5 %   Platelets 206 150 - 400 K/uL   nRBC 0.0 0.0 - 0.2 %    Comment: Performed at [facility], [place] [address], [place], [place] [address]  CBG monitoring, ED     Status: Abnormal   Collection Time: 01/30/19  8:03 AM  Result Value Ref Range   Glucose-Capillary 161 (H) 70 - 99 mg/dL   Ct Head Wo Contrast  Result Date:  01/30/2019 CLINICAL DATA:  Fall EXAM: CT HEAD WITHOUT CONTRAST TECHNIQUE: Contiguous axial images were obtained from the base of the skull through the vertex without intravenous contrast. COMPARISON:  None. FINDINGS: Brain: No evidence of acute infarction, hemorrhage, hydrocephalus, extra-axial collection or mass lesion/mass effect. Vascular: No hyperdense vessel or unexpected calcification. Skull: Normal. Negative for fracture or focal lesion. Sinuses/Orbits: No acute finding. Other: None. IMPRESSION: No acute intracranial pathology. Electronically Signed   By: Eddie Candle M.D.   On: 01/30/2019 13:12   Ct Chest W Contrast  Result Date: 01/29/2019 CLINICAL DATA:  Abd trauma, blunt, stable. Lawnmower flipped on patient. EXAM: CT CHEST, ABDOMEN, AND PELVIS WITH CONTRAST TECHNIQUE: Multidetector CT imaging of the chest, abdomen and pelvis was performed following the standard protocol during bolus administration of intravenous contrast. CONTRAST:  112m OMNIPAQUE IOHEXOL 300 MG/ML  SOLN COMPARISON:  Chest and pelvic radiographs earlier this day. Abdominal CT 11/07/2015 FINDINGS: CT CHEST FINDINGS Cardiovascular: No acute vascular injury. Aortic atherosclerosis. No periaortic stranding. Coronary artery calcifications. Normal heart size. No pericardial effusion. Mediastinum/Nodes: Mediastinal hemorrhage or hematoma. No pneumomediastinum. Small mediastinal and hilar lymph nodes not enlarged by size criteria. Esophagus is decompressed. No visualized thyroid nodule. Lungs/Pleura: No pneumothorax. No pulmonary contusion. Irregular perifissural density in the superior segment of the left lower lobe, image 52 series 4. Small irregular 4 x 6 mm nodule in the right upper lobe, image 65 series 4. Mild bronchial thickening. No pleural fluid. No pulmonary edema. Musculoskeletal: No acute fracture of the ribs, sternum, included clavicles or shoulder girdles. Thoracic spine accessed on concurrent thoracic spine reformats,  reported separately. CT ABDOMEN PELVIS FINDINGS Hepatobiliary: No hepatic injury or perihepatic hematoma. Hepatic steatosis. Gallbladder is unremarkable. Pancreas: No evidence of injury. No ductal dilatation or inflammation. Spleen: No splenic injury or perisplenic hematoma. Small splenule anteriorly. Adrenals/Urinary Tract: No adrenal hemorrhage or renal injury identified. Unchanged 1.4 cm right adrenal nodule consistent with benign adenoma. Unchanged left adrenal thickening. Bilateral perinephric edema is also chronic and unchanged. Bladder is unremarkable. Stomach/Bowel: No evidence of bowel injury. No bowel wall thickening or inflammation. No mesenteric hematoma. Small bowel is decompressed. Moderate colonic stool burden. Appendix surgically absent. Vascular/Lymphatic: No evidence of vascular injury.  Aortic atherosclerosis and tortuosity. IVC is intact. No retroperitoneal fluid. Small periportal nodes are likely reactive. Small central mesenteric nodes also reactive. No enlarged lymph nodes in the abdomen or pelvis. Reproductive: Prostate is unremarkable. Other: No free air or free fluid. Musculoskeletal: L1 fracture better assessed on dedicated lumbar spine reformats, reported separately. No acute pelvic fracture. IMPRESSION: 1. L1 fracture is better assessed on dedicated lumbar fine reformats and reported separately. 2. No additional acute traumatic injury to the chest, abdomen, or pelvis. 3. Small pulmonary nodules in both lungs, largest mean diameter 5 mm. No follow-up needed if patient is low-risk (and has no known or suspected primary neoplasm). Non-contrast chest CT can be considered in 12 months if patient is high-risk. This recommendation follows the consensus statement: Guidelines for Management of Incidental Pulmonary Nodules Detected on CT Images: From the Fleischner Society 2017; Radiology 2017; 284:228-243. 4. Incidental findings of hepatic steatosis and right adrenal adenoma. Aortic  Atherosclerosis (ICD10-I70.0). Electronically Signed   By: Keith Rake M.D.   On: 01/29/2019 23:28   Ct Abdomen Pelvis W Contrast  Result Date: 01/29/2019 CLINICAL DATA:  Abd trauma, blunt, stable. Lawnmower flipped on patient. EXAM: CT CHEST, ABDOMEN, AND PELVIS WITH CONTRAST TECHNIQUE: Multidetector CT imaging of the chest, abdomen and pelvis was performed following the standard protocol during bolus administration of intravenous contrast. CONTRAST:  123m OMNIPAQUE IOHEXOL 300 MG/ML  SOLN COMPARISON:  Chest and pelvic radiographs earlier this day. Abdominal CT 11/07/2015 FINDINGS: CT CHEST FINDINGS Cardiovascular: No acute vascular injury. Aortic atherosclerosis. No periaortic stranding. Coronary artery calcifications. Normal heart size. No pericardial effusion. Mediastinum/Nodes: Mediastinal hemorrhage or hematoma. No pneumomediastinum. Small mediastinal and hilar lymph nodes not enlarged by size criteria. Esophagus is decompressed. No visualized thyroid nodule. Lungs/Pleura: No pneumothorax. No pulmonary contusion. Irregular perifissural density in the superior segment of the left lower lobe, image 52 series 4. Small irregular 4 x 6 mm nodule in the right upper lobe, image 65 series 4. Mild bronchial thickening. No pleural fluid. No pulmonary edema. Musculoskeletal: No acute fracture of the ribs, sternum, included clavicles or shoulder girdles. Thoracic spine accessed on concurrent thoracic spine reformats, reported separately. CT ABDOMEN PELVIS FINDINGS Hepatobiliary: No hepatic injury or perihepatic hematoma. Hepatic steatosis. Gallbladder is unremarkable. Pancreas: No evidence of injury. No ductal dilatation or inflammation. Spleen: No splenic injury or perisplenic hematoma. Small splenule anteriorly. Adrenals/Urinary Tract: No adrenal hemorrhage or renal injury identified. Unchanged 1.4 cm right adrenal nodule consistent with benign adenoma. Unchanged left adrenal thickening. Bilateral perinephric  edema is also chronic and unchanged. Bladder is unremarkable. Stomach/Bowel: No evidence of bowel injury. No bowel wall thickening or inflammation. No mesenteric hematoma. Small bowel is decompressed. Moderate colonic stool burden. Appendix surgically absent. Vascular/Lymphatic: No evidence of vascular injury. Aortic atherosclerosis and tortuosity. IVC is intact. No retroperitoneal fluid. Small periportal nodes are likely reactive. Small central mesenteric nodes also reactive. No enlarged lymph nodes in the abdomen or pelvis. Reproductive: Prostate is unremarkable. Other: No free air or free fluid. Musculoskeletal: L1 fracture better assessed on dedicated lumbar spine reformats, reported separately. No acute pelvic fracture. IMPRESSION: 1. L1 fracture is better assessed on dedicated lumbar fine reformats and reported separately. 2. No additional acute traumatic injury to the chest, abdomen, or pelvis. 3. Small pulmonary nodules in both lungs, largest mean diameter 5 mm. No follow-up needed if patient is low-risk (and has no known or suspected primary neoplasm). Non-contrast chest CT can be considered in 12 months if patient is high-risk. This recommendation follows  the consensus statement: Guidelines for Management of Incidental Pulmonary Nodules Detected on CT Images: From the Fleischner Society 2017; Radiology 2017; 284:228-243. 4. Incidental findings of hepatic steatosis and right adrenal adenoma. Aortic Atherosclerosis (ICD10-I70.0). Electronically Signed   By: Keith Rake M.D.   On: 01/29/2019 23:28   Dg Pelvis Portable  Result Date: 01/29/2019 CLINICAL DATA:  Trauma, flipped riding lawnmower. EXAM: PORTABLE PELVIS 1-2 VIEWS COMPARISON:  None. FINDINGS: Examination technically limited by body habitus. The cortical margins of the bony pelvis are intact. No fracture. Pubic symphysis and sacroiliac joints are congruent. Both femoral heads are well-seated in the respective acetabula. IMPRESSION: No  evidence of pelvic fracture. Electronically Signed   By: Keith Rake M.D.   On: 01/29/2019 21:04   Ct T-spine No Charge  Result Date: 01/29/2019 CLINICAL DATA:  Lawnmower injury EXAM: CT THORACIC AND LUMBAR SPINE WITHOUT CONTRAST TECHNIQUE: Multidetector CT imaging of the thoracic and lumbar spine was generated from the concomitant CT of the chest, abdomen and pelvis. Multiplanar CT image reconstructions were also generated. COMPARISON:  None. FINDINGS: CT THORACIC SPINE FINDINGS Alignment: Normal. Vertebrae: Hemangioma at T6. Flowing anterior osteophytes at T7-10. No acute fracture. Disc levels: No spinal canal stenosis. CT LUMBAR SPINE FINDINGS Segmentation: 5 lumbar type vertebrae. Alignment: Normal. Vertebrae: There is a split/pincer type (AOSpine classification A2) fracture of the L1 vertebral body with 10% height loss. No retropulsion. Fracture involves the superior and inferior endplates and anterior wall. No extension to the posterior wall or posterior elements. No osseous tension band disruption. No other fracture. Disc levels: There is an intermediate disc bulge at L5-S1 with endplate spurring contributing to mild bilateral foraminal stenosis. There is no lumbar spinal canal stenosis. IMPRESSION: 1. L1 split/pincer type (AOSpine classification A2) fracture with 10% height loss and no retropulsion. 2. No thoracic spine fracture. Electronically Signed   By: Ulyses Jarred M.D.   On: 01/29/2019 23:19   Ct L-spine No Charge  Result Date: 01/29/2019 CLINICAL DATA:  Lawnmower injury EXAM: CT THORACIC AND LUMBAR SPINE WITHOUT CONTRAST TECHNIQUE: Multidetector CT imaging of the thoracic and lumbar spine was generated from the concomitant CT of the chest, abdomen and pelvis. Multiplanar CT image reconstructions were also generated. COMPARISON:  None. FINDINGS: CT THORACIC SPINE FINDINGS Alignment: Normal. Vertebrae: Hemangioma at T6. Flowing anterior osteophytes at T7-10. No acute fracture. Disc levels:  No spinal canal stenosis. CT LUMBAR SPINE FINDINGS Segmentation: 5 lumbar type vertebrae. Alignment: Normal. Vertebrae: There is a split/pincer type (AOSpine classification A2) fracture of the L1 vertebral body with 10% height loss. No retropulsion. Fracture involves the superior and inferior endplates and anterior wall. No extension to the posterior wall or posterior elements. No osseous tension band disruption. No other fracture. Disc levels: There is an intermediate disc bulge at L5-S1 with endplate spurring contributing to mild bilateral foraminal stenosis. There is no lumbar spinal canal stenosis. IMPRESSION: 1. L1 split/pincer type (AOSpine classification A2) fracture with 10% height loss and no retropulsion. 2. No thoracic spine fracture. Electronically Signed   By: Ulyses Jarred M.D.   On: 01/29/2019 23:19   Dg Chest Port 1 View  Result Date: 01/29/2019 CLINICAL DATA:  Trauma, flipped riding lawnmower. EXAM: PORTABLE CHEST 1 VIEW COMPARISON:  Chest radiograph 10/11/2018. FINDINGS: Low lung volumes. No visualized pneumothorax. Previous bilateral airspace disease has improved and near completely resolved, question residual scarring in the right perihilar region. No large pleural effusion. Unchanged heart size and mediastinal contours allowing for low lung volumes. No grossly displaced  rib fracture. IMPRESSION: 1. Low lung volumes without evidence of acute traumatic injury. 2. Bilateral airspace opacities on March 2020 radiographs is near completely resolved with possible residual scarring in the right perihilar region. Electronically Signed   By: Keith Rake M.D.   On: 01/29/2019 21:03   Dg Knee Complete 4 Views Right  Result Date: 01/29/2019 CLINICAL DATA:  Trauma, flipped riding lawnmower. EXAM: RIGHT KNEE - COMPLETE 4+ VIEW COMPARISON:  None. FINDINGS: No acute fracture or dislocation. Moderate tricompartmental osteoarthritis with peripheral spurring. Medial tibiofemoral joint space narrowing.  Small joint effusion. Small quadriceps tendon enthesophyte. IMPRESSION: Moderate tricompartmental osteoarthritis without acute osseous abnormality. No acute fracture. Electronically Signed   By: Keith Rake M.D.   On: 01/29/2019 21:05    Pending Labs Unresulted Labs (From admission, onward)    Start     Ordered   01/30/19 0325  Novel Coronavirus,NAA,(SEND-OUT TO REF LAB - TAT 24-48 hrs); Hosp Order  (Asymptomatic Patients Labs)  ONCE - STAT,   STAT    Question:  Rule Out  Answer:  Yes   01/30/19 0324          Vitals/Pain Today's Vitals   01/30/19 0934 01/30/19 1310 01/30/19 1334 01/30/19 1339  BP:  140/76    Pulse:  98    Resp:  19    Temp:  97.9 F (36.6 C)    TempSrc:  Oral    SpO2:  93%    Weight:      Height:      PainSc: 8   9  9      Isolation Precautions No active isolations  Medications Medications  enoxaparin (LOVENOX) injection 40 mg (has no administration in time range)  ALPRAZolam (XANAX) tablet 1 mg (has no administration in time range)  amLODipine (NORVASC) tablet 10 mg (10 mg Oral Given 01/30/19 0932)  atorvastatin (LIPITOR) tablet 40 mg (has no administration in time range)  ipratropium-albuterol (DUONEB) 0.5-2.5 (3) MG/3ML nebulizer solution 3 mL (has no administration in time range)  lamoTRIgine (LAMICTAL) tablet 150 mg (150 mg Oral Given 01/30/19 0932)  pantoprazole (PROTONIX) EC tablet 40 mg (40 mg Oral Given 01/30/19 0931)  pregabalin (LYRICA) capsule 150 mg (150 mg Oral Given 01/30/19 0931)  QUEtiapine (SEROQUEL) tablet 225 mg (has no administration in time range)  senna (SENOKOT) tablet 8.6 mg (8.6 mg Oral Given 01/30/19 0932)  fluticasone furoate-vilanterol (BREO ELLIPTA) 100-25 MCG/INH 1 puff (1 puff Inhalation Given 01/30/19 0759)    And  umeclidinium bromide (INCRUSE ELLIPTA) 62.5 MCG/INH 1 puff (1 puff Inhalation Given 01/30/19 0758)  lithium carbonate (ESKALITH) CR tablet 675 mg (675 mg Oral Given 01/30/19 0932)    And  lithium carbonate  (ESKALITH) CR tablet 450 mg (has no administration in time range)  insulin aspart protamine- aspart (NOVOLOG MIX 70/30) injection 10 Units (10 Units Subcutaneous Given 01/30/19 0804)  HYDROcodone-acetaminophen (HYCET) 7.5-325 mg/15 ml solution 10 mL (10 mLs Oral Given 01/30/19 1339)  promethazine (PHENERGAN) tablet 12.5 mg (has no administration in time range)  fentaNYL (SUBLIMAZE) injection 50 mcg (50 mcg Intravenous Given 01/29/19 2018)  sodium chloride 0.9 % bolus 1,000 mL (0 mLs Intravenous Stopped 01/29/19 2142)  iohexol (OMNIPAQUE) 300 MG/ML solution 100 mL (100 mLs Intravenous Contrast Given 01/29/19 2230)  fentaNYL (SUBLIMAZE) injection 50 mcg (50 mcg Intravenous Given 01/29/19 2326)  HYDROmorphone (DILAUDID) injection 1 mg (1 mg Intravenous Given 01/30/19 0125)  ondansetron (ZOFRAN) injection 4 mg (4 mg Intravenous Given 01/30/19 0125)  HYDROmorphone (DILAUDID) injection 1 mg (  1 mg Intravenous Given 01/30/19 0347)    Mobility walks with device Low fall risk   Focused Assessments   R Recommendations: See Admitting Provider Note  Report given to:   Additional Notes:

## 2019-01-30 NOTE — TOC Initial Note (Signed)
Transition of Care [facility]) - Initial/Assessment Note    Patient Details  Name: Jesus Martin MRN: [id number] Date of Birth: 03/09/1961  Transition of [facility]) CM/SW Contact:    Bartholomew Crews, RN Phone Number: [phone] 01/30/2019, 4:12 PM  Clinical Narrative:                 Spoke with patient at the bedside. PTA home with wife. Patient not interested in talking with CM stating needs to see doctor and go home. Advised that CM was here to assist with discharge needs like [place] and equipment. States his wife will be providing care and wants no agency. Discussed specialized therapy needs. Patient continues to decline, but does verbalize teaching for "BTL...no bending, no twisting, no lifting." States he is going camping next weekend. Advised to listen to his body and follow advice of therapy here at hospital. Patient verbalized understanding. Declined HH - advised that he can call PCP office -states he is followed by Internal Medicine - if he decides he would like [place]. Declines 3N1 saying he already has one, but agrees to RW. Patient will need DME - RW order. [employer] notified of equipment needs. Patient states his wife will pick him at discharge. Denies any medication issues. CM to follow for transition of care needs.         Patient Goals and CMS Choice        Expected Discharge Plan and Services           Expected Discharge Date: 01/31/19                                    Prior Living Arrangements/Services                       Activities of Daily Living Home Assistive Devices/Equipment: Oxygen ADL Screening (condition at time of admission) Patient's cognitive ability adequate to safely complete daily activities?: Yes Is the patient deaf or have difficulty hearing?: No Does the patient have difficulty seeing, even when wearing glasses/contacts?: No Does the patient have difficulty concentrating, remembering, or making decisions?: Yes Patient able to  express need for assistance with ADLs?: Yes Does the patient have difficulty dressing or bathing?: No Independently performs ADLs?: Yes (appropriate for developmental age) Does the patient have difficulty walking or climbing stairs?: Yes Weakness of Legs: None Weakness of Arms/Hands: None  Permission Sought/Granted                  Emotional Assessment              Admission diagnosis:  Pain [R52] Compression fracture of L1 vertebra, initial encounter ([place]) [S32.010A] Patient Active Problem List   Diagnosis Date Noted  . Compression fracture of spine ([place]) 01/30/2019  . Serum calcium elevated 01/10/2019  . Daytime somnolence 01/10/2019  . Beta-hemolytic group A streptococcal sepsis ([place])   . Hypokalemia   . Bipolar disorder in full remission ([place])   . Leukocytosis   . Influenza A   . Multifocal pneumonia 10/07/2018  . Hyperlipidemia 09/26/2018  . Foot pain, bilateral 09/26/2018  . Diabetic neuropathy ([place]) 12/22/2017  . Chronic pain 11/24/2017  . Hepatic steatosis 11/24/2017  . COPD (chronic obstructive pulmonary disease) ([place]) 11/24/2017  . Diabetes ([place])   . Acute respiratory failure with hypoxia ([place])   . Bipolar 1 disorder ([place]) 04/17/2011  . Hypertension 04/17/2011  .  Former smoker 04/17/2011   PCP:  Katherine Roan, MD Pharmacy:   [place], [place] - [address] [place] [address] Phone: [phone] Fax: [phone]     Social Determinants of Health (SDOH) Interventions    Readmission Risk Interventions No flowsheet data found.

## 2019-01-30 NOTE — Evaluation (Signed)
Physical Therapy Evaluation Patient Details Name: Jesus Martin MRN: [id number] DOB: 07-21-1961 Today's Date: 01/30/2019   History of Present Illness  Pt is a 58 y/o male admitted after fall off of lawn mower. Found to have L1 compression fx. PMH includes bipolar disorder, HTN, and COPD.   Clinical Impression  Pt admitted secondary to problem above with deficits below. Pt requiring min A for bed mobility and min guard A for gait with use of RW. Pt very guarded during mobility secondary to increased pain, however, did report ambulation was better with use of RW. Educated about back precautions and use of RW at home. Also gave handout for use of RW during stair management. Will continue to follow acutely to maximize functional mobility independence and safety.      Follow Up Recommendations Home health PT;Supervision for mobility/OOB    Equipment Recommendations  Rolling walker with 5" wheels    Recommendations for Other Services       Precautions / Restrictions Precautions Precautions: Back Precaution Booklet Issued: Yes (comment) Precaution Comments: Reviewed back precautions with pt.  Required Braces or Orthoses: Spinal Brace Spinal Brace: Thoracolumbosacral orthotic Restrictions Weight Bearing Restrictions: No Other Position/Activity Restrictions: TLSO was on in supine, upon entry      Mobility  Bed Mobility Overal bed mobility: Needs Assistance Bed Mobility: Rolling;Sidelying to Sit;Sit to Sidelying Rolling: Min assist Sidelying to sit: Min assist     Sit to sidelying: Supervision General bed mobility comments: Min A to roll to side and for trunk elevation to come to sitting. Cues for use of log roll technique.   Transfers Overall transfer level: Needs assistance Equipment used: Rolling walker (2 wheeled) Transfers: Sit to/from Stand Sit to Stand: Min guard         General transfer comment: Min guard for steadying assist. Cues for safe hand placement.    Ambulation/Gait Ambulation/Gait assistance: Min guard Gait Distance (Feet): 100 Feet Assistive device: Rolling walker (2 wheeled) Gait Pattern/deviations: Step-through pattern;Decreased stride length;Trunk flexed Gait velocity: Decreased    General Gait Details: Slow, guarded gait. Pt reports feeling better ambulating with use of RW. Min guard for safety. Some shakiness noted, however, no LOB.   Stairs            Wheelchair Mobility    Modified Rankin (Stroke Patients Only)       Balance Overall balance assessment: Needs assistance Sitting-balance support: No upper extremity supported;Feet supported Sitting balance-Leahy Scale: Good     Standing balance support: Bilateral upper extremity supported;During functional activity Standing balance-Leahy Scale: Poor Standing balance comment: Reliant on BUE support                              Pertinent Vitals/Pain Pain Assessment: 0-10 Pain Score: 10-Worst pain ever Pain Location: back Pain Descriptors / Indicators: Grimacing;Guarding;Sharp Pain Intervention(s): Limited activity within patient's tolerance;Monitored during session;Repositioned    Home Living Family/patient expects to be discharged to:: Private residence Living Arrangements: Spouse/significant other Available Help at Discharge: Family Type of Home: House Home Access: Stairs to enter Entrance Stairs-Rails: None Technical brewer of Steps: 3 Home Layout: One level Home Equipment: None      Prior Function Level of Independence: Independent               Hand Dominance        Extremity/Trunk Assessment   Upper Extremity Assessment Upper Extremity Assessment: Defer to OT evaluation    Lower  Extremity Assessment Lower Extremity Assessment: Generalized weakness    Cervical / Trunk Assessment Cervical / Trunk Assessment: Other exceptions Cervical / Trunk Exceptions: L1 compression fx.  Communication   Communication:  No difficulties  Cognition Arousal/Alertness: Awake/alert Behavior During Therapy: WFL for tasks assessed/performed Overall Cognitive Status: Within Functional Limits for tasks assessed                                        General Comments General comments (skin integrity, edema, etc.): Gave handout for how to negotiate stairs with use of RW.     Exercises     Assessment/Plan    PT Assessment Patient needs continued PT services  PT Problem List Decreased strength;Decreased mobility;Decreased knowledge of use of DME;Decreased knowledge of precautions;Pain;Decreased activity tolerance       PT Treatment Interventions DME instruction;Gait training;Functional mobility training;Stair training;Therapeutic activities;Therapeutic exercise;Balance training;Patient/family education    PT Goals (Current goals can be found in the Care Plan section)  Acute Rehab PT Goals Patient Stated Goal: to decrease pain PT Goal Formulation: With patient Time For Goal Achievement: 02/13/19 Potential to Achieve Goals: Good    Frequency Min 3X/week   Barriers to discharge        Co-evaluation               AM-PAC PT "6 Clicks" Mobility  Outcome Measure Help needed turning from your back to your side while in a flat bed without using bedrails?: A Little Help needed moving from lying on your back to sitting on the side of a flat bed without using bedrails?: A Little Help needed moving to and from a bed to a chair (including a wheelchair)?: A Little Help needed standing up from a chair using your arms (e.g., wheelchair or bedside chair)?: A Little Help needed to walk in hospital room?: A Little Help needed climbing 3-5 steps with a railing? : A Lot 6 Click Score: 17    End of Session Equipment Utilized During Treatment: Gait belt;Back brace Activity Tolerance: Patient limited by pain Patient left: in bed;with call bell/phone within reach Nurse Communication: Mobility  status PT Visit Diagnosis: Other abnormalities of gait and mobility (R26.89);Pain Pain - part of body: (back)    Time: 4970-2637 PT Time Calculation (min) (ACUTE ONLY): 20 min   Charges:   PT Evaluation $PT Eval Moderate Complexity: [place], PT, DPT  Acute Rehabilitation Services  Pager: [phone] Office: [phone]   Rudean Hitt 01/30/2019, 1:49 PM

## 2019-01-30 NOTE — ED Notes (Signed)
RN navigator made contact with patient's wife. Given update over the phone, plan for patient to go home after PT/OT recs. Wife appreciated the update.

## 2019-01-30 NOTE — Progress Notes (Signed)
   Subjective: Patient reports that he is still having some back pain right now, he denies any numbness, tingling, or weakness. He reports that he just got some pain medications and is waiting for PT/OT to see him.   Objective:  Vital signs in last 24 hours: Vitals:   01/29/19 2200 01/30/19 0000 01/30/19 0300 01/30/19 0435  BP: (!) 137/95 (!) 140/91 (!) 143/95 (!) 153/93  Pulse: 98 (!) 101 98 (!) 102  Resp: (!) 22 18 (!) 21   Temp:      TempSrc:      SpO2: 96% 92% 90% 92%  Weight:      Height:        General: Middle aged male, laying flat, back brace in place Cardiac: RRR, no m./r/g Pulmonary: Minimal wheezing throughout lung fields, normal work of breathing Abdomen: Soft, non-tender, non-distended, normoactive bowel sounds Neuro: Strength: 4/5 in BL lower extremities limited by pain, 5/5/ in upper extremity, sensation to light touch intact   Assessment/Plan:  Active Problems:   Compression fracture of spine (HCC)  This is a 58 year old male with a history of diabetes type 2, COPD, hypertension, bipolar disorder presented after a lawn mower fell on his right leg.  CT spine showed L1 compression fracture, neurosurgery consulted place TLSO brace and recommended outpatient follow-up.  Patient is having significant pain and required IV pain medications.  L1 compression fracture: S/p traumatic fall, however given the location of the fall and short distance of the fall he may have an underlying component of osteoporosis and will need further evaluation outpatient.  Neurosurgery elevated in the ED, TLSO brace placed with outpatient follow-up planned. Patient is still having a lot of pain today. He got 1 dose of dialudid this morning.   -PT/OT eval -Stop Dilaudid 1 mg q3h prn -Start Percocet 5-325 1 tab q6 hrs PRN -Possible d/c today with prescription for 5 day course of pain medications if able, will need to re-evaluate later in the evening and f/u PT/OT recs -Will need follow in in  [facility] later this week or early next week if discharged  Bipolar disorder: On xanax, lamictal, lithium, lyrica, seroquel. Follows with Dr. Robina Ade.   -Continue Xanax 1 mg BID prn -Lamictal 150 mg bid - lithium 675 mg qd and 450 mg qhs  - lyrica 225 mg bid - seroquel 225 mg qhs   HTN: BP ranged 137-165/91-95. Patient has been having pain which may be contributing to his hypertension.   -Continue home norvasc 10 mg qd  COPD:Still has some wheezing on exam.  -Continue breo and incruse -Continue duonebs q4 PRN  FEN: No fluids, replete lytes prn, CM diet  VTE ppx: Lovenox  Code Status: FULL    Dispo: Anticipated discharge in approximately 0-1 day(s).   Asencion Noble, MD 01/30/2019, 6:47 AM Pager: [phone]

## 2019-01-30 NOTE — Discharge Summary (Addendum)
Name: Jesus Martin MRN: [id number] DOB: Sep 04, 1960 58 y.o. PCP: Jesus Roan, MD  Date of Admission: 01/29/2019  7:42 PM Date of Discharge: 01/31/19 Attending Physician: Jesus Contes, MD  Discharge Diagnosis: 1. L1 Compression Fracture  2. Bipolar Disorder  3. COPD   Discharge Medications: Allergies as of 01/30/2019      Reactions   Yellow Jacket Venom [bee Venom] Anaphylaxis, Other (See Comments)   And the patient passes out   5-alpha Reductase Inhibitors    Pt unsure why this is listed      Medication List    STOP taking these medications   Insulin Lispro Prot & Lispro (75-25) 100 UNIT/ML Kwikpen Commonly known as: HUMALOG 75/25 MIX     TAKE these medications   Accu-Chek FastClix Lancets Misc Check blood sugar 3 times a day.   Accu-Chek Guide w/Device Kit 1 each by Does not apply route 3 (three) times daily.   acetaminophen 325 MG tablet Commonly known as: TYLENOL Take 325-650 mg by mouth every 6 (six) hours as needed (for pain or headaches).   alprazolam 2 MG tablet Commonly known as: XANAX Take 2 mg by mouth at bedtime as needed for sleep or anxiety.   amLODipine 10 MG tablet Commonly known as: NORVASC TAKE 1 TABLET BY MOUTH ONCE DAILY   atorvastatin 40 MG tablet Commonly known as: LIPITOR Take 1 tablet (40 mg total) by mouth daily at 6 PM.   blood glucose meter kit and supplies Kit Dispense based on patient and insurance preference. Use up to four times daily as directed. (FOR ICD-9 250.00, 250.01).   diclofenac sodium 1 % Gel Commonly known as: VOLTAREN Apply 2 g topically 4 (four) times daily.   diphenhydrAMINE 25 mg capsule Commonly known as: BENADRYL Take 25 mg by mouth every 8 (eight) hours as needed for allergies.   EpiPen 2-Pak 0.3 mg/0.3 mL Soaj injection Generic drug: EPINEPHrine Inject 0.3 mg into the muscle once as needed (for anaphylactic reaction to bee stings).   glucose blood test strip Commonly known as:  Accu-Chek Guide Check blood sugar 3 times a day.   Insulin Syringe-Needle U-100 31G X 15/64" 0.3 ML Misc Use to inject insulin two times each day   ipratropium-albuterol 0.5-2.5 (3) MG/3ML Soln Commonly known as: DUONEB Take 3 mLs by nebulization every 4 (four) hours as needed.   lamoTRIgine 150 MG tablet Commonly known as: LAMICTAL Take 150 mg by mouth 2 (two) times daily.   lithium carbonate 450 MG CR tablet Commonly known as: ESKALITH Take 450-675 mg by mouth See admin instructions. 675 mg by mouth in the morning and 450 mg at bedtime   metFORMIN 1000 MG tablet Commonly known as: GLUCOPHAGE TAKE 1 TABLET BY MOUTH TWICE DAILY WITH MEALS   multivitamin Tabs tablet Take 1 tablet by mouth daily.   NovoLOG Mix 70/30 (70-30) 100 UNIT/ML injection Generic drug: insulin aspart protamine- aspart Inject 0.15 mLs (15 Units total) into the skin 2 (two) times daily with a meal.   omeprazole 40 MG capsule Commonly known as: PRILOSEC Take 1 capsule (40 mg total) by mouth daily.   oxyCODONE-acetaminophen 5-325 MG tablet Commonly known as: Percocet Take 1 tablet by mouth every 8 (eight) hours as needed for up to 3 days for severe pain.   pregabalin 150 MG capsule Commonly known as: LYRICA TAKE 1 CAPSULE BY MOUTH TWICE DAILY   QUEtiapine 50 MG tablet Commonly known as: SEROQUEL Take 225 mg by mouth at bedtime.  traMADol 50 MG tablet Commonly known as: ULTRAM Take 1 tablet (50 mg total) by mouth every 12 (twelve) hours as needed for up to 30 days. What changed: Another medication with the same name was removed. Continue taking this medication, and follow the directions you see here.   Trelegy Ellipta 100-62.5-25 MCG/INH Aepb Generic drug: Fluticasone-Umeclidin-Vilant INHALE 1 PUFF INTO LUNGS DAILY What changed: See the new instructions.   Ventolin HFA 108 (90 Base) MCG/ACT inhaler Generic drug: albuterol Inhale 2 puffs into the lungs every 6 (six) hours as needed for  wheezing or shortness of breath.            Durable Medical Equipment  (From admission, onward)         Start     Ordered   01/30/19 1616  For home use only DME Walker rolling  Once    Question:  Patient needs a walker to treat with the following condition  Answer:  Compression fracture of L1 lumbar vertebra Jesus Martin)   01/30/19 1616          Disposition and follow-up:   Jesus Martin was discharged from Jesus Martin in Stable condition.  At the hospital follow up visit please address:  1.  L1 compression fracture: please make sure patient is getting home health pt and ot. Please counsel patient that he should be limiting his activity and to not go on a hiking trip. Assess if patient needs more pain medication.  2.  Labs / imaging needed at time of follow-up: none  3.  Pending labs/ test needing follow-up: none  Follow-up Appointments:   Hospital Course by problem list:  1. L1 Compression fracture Patient presented with back pain after her fell of a lawnmower. Imaging revealed an acute L1 compression fracture. He was placed in a T SLO brace. Evaluated by PT and OT and recommended home health PT and OT. The patient stated that his wife used to work for a home health agency and did not want home health services due to financial services, but he was abl to be convinced. The patient was requiring minimal pain medication during hospitalization so was discharged on percocet 5-32m q8hrs prn for 3 day course. The patient has close follow up with Internal Medicine Clinic at which time need for more pain medication can be assessed.   Discharge Vitals:   BP (!) 153/89 (BP Location: Left Arm)   Pulse (!) 105   Temp 97.6 F (36.4 C) (Oral)   Resp 17   Ht 6' 1"  (1.854 m)   Wt 118.4 kg   SpO2 94%   BMI 34.43 kg/m   Pertinent Labs, Studies, and Procedures:   CT head 6/22 Did not show any acute intracranial abnormalities   CT spine 6/21 read 1. L1  split/pincer type (AOSpine classification A2) fracture with 10% height loss and no retropulsion. 2. No thoracic spine fracture.  CT abdomen read  1. L1 fracture is better assessed on dedicated lumbar fine reformats and reported separately. 2. No additional acute traumatic injury to the chest, abdomen, or pelvis. 3. Small pulmonary nodules in both lungs, largest mean diameter 5 mm. No follow-up needed if patient is low-risk (and has no known or suspected primary neoplasm). Non-contrast chest CT can be considered in 12 months if patient is high-risk. This recommendation follows the consensus statement: Guidelines for Management of Incidental Pulmonary Nodules Detected on CT Images: From the Fleischner Society 2017; Radiology 2017; 284:228-243. 4. Incidental findings of hepatic  steatosis and right adrenal adenoma. Aortic Atherosclerosis (ICD10-I70.0).   Discharge Instructions: Discharge Instructions    Call MD for:  difficulty breathing, headache or visual disturbances   Complete by: As directed    Call MD for:  extreme fatigue   Complete by: As directed    Call MD for:  persistant dizziness or light-headedness   Complete by: As directed    Call MD for:  severe uncontrolled pain   Complete by: As directed    Diet - low sodium heart healthy   Complete by: As directed    Discharge instructions   Complete by: As directed    It was a pleasure to take care of you Mr. Wichert. During your hospitalization you were taken care of for your injury after lawnmower accident. You have a compression fracture in your back.   Increase activity slowly   Complete by: As directed       Signed: Lars Mage, MD   Pager: [phone]

## 2019-01-30 NOTE — H&P (Addendum)
Date: 01/30/2019               Patient Name:  Jesus Martin MRN: [id number]  DOB: [birth year] Age / Sex: 58 y.o., male   PCP: Katherine Roan, MD         Medical Service: Internal Medicine Teaching Service         Attending Physician: Dr. Aldine Contes, MD    First Contact: Dr. Sherry Ruffing Pager: [phone]  Second Contact: Dr. Maricela Bo Pager: [phone]       After Hours (After 5p/  First Contact Pager: [phone]  weekends / holidays): Second Contact Pager: [phone]   Chief Complaint: lower back pain  History of Present Illness:  Jesus Martin is a 58yo male with PMH TIIDM, COPD, HTN, and Bipolar disorder presenting after his lawn mover fell on top of his right leg after running over a bag of fertilizer. He denies LOC but had acute, non-radiating RLE and low back pain for which he presented to the ED. He denies numbness or tingling, trauma to the head, laceration, bruising, changes in vision, nausea, abdominal pain, or focal weakness. He has chronic shortness of breath.   In the ED, patient was hemodynamically stable with high blood pressure. CT of T-spine, L-spine, Chest and abd pelvis w/contrast were done. CT of L-spine showed L1 compression fracture, and neurosurgery was consulted. TLSO brace was placed with recommendation of outpatient follow-up. Patient initially preferred to go home but had extreme pain and difficulty with ambulation. He was able to ambulate a very short distance only after receiving 75m IV dilaudid. IMTS was called for admission for pain control requiring IV medications.   Social:  Patient lives at home with his wife, kids and dogs.   Family History:  Family History  Adopted: Yes     Meds:  No outpatient medications have been marked as taking for the 01/29/19 encounter [facility]     Allergies: Allergies as of 01/29/2019 - Review Complete 01/29/2019  Allergen Reaction Noted  . Yellow jacket venom [bee venom] Anaphylaxis and Other  (See Comments) 09/20/2017  . 5-alpha reductase inhibitors  10/07/2018   Past Medical History:  Diagnosis Date  . Bipolar I disorder (HCC) 1991   Dr. KToy Care . Borderline hyperlipidemia    history of  . Childhood asthma   . COPD (chronic obstructive pulmonary disease) ([facility] [place]   . Diabetes mellitus, new onset ([place] 12/22/2017  . Elevated glucose   . Fibromyalgia   . GERD (gastroesophageal reflux disease)   . HTN (hypertension)   . Low vitamin B12 level 06/07/2018  . Lower back pain   . Paranoia ([place]   . Tobacco abuse   . Tobacco abuse   . Vitamin D deficiency      Review of Systems: A complete ROS was negative except as per HPI.   Physical Exam: Blood pressure (!) 143/95, pulse 98, temperature 98.9 F (37.2 C), temperature source Oral, resp. rate (!) 21, height 6' 1"  (1.854 m), weight 118.4 kg, SpO2 90 %.  Constitution: NAD, supine in bed HENT: [place]/AT  Eyes: no icterus or injection, EOM intact Cardio: RRR, no m/r/g  Respiratory: CTA, no wheezing rales or ronchi Abdominal: soft, large abdomen, +BS, NTTP  MSK: strength symmetrical upper and lower extremities with full active range of motion, normal sensation Neuro: a&o, normal affect, pleasant  Skin: c/d/i, no hematoma   CT L-Spine:  L1 Compression fracture with 10% heigh loss   Assessment & Plan by  Problem: Active Problems:   Compression fracture of spine (HCC)  L1 Compression Fracture TLSO brace placed by neurosurgery with outpatient follow-up planned. Current admission for pain control as patient is unable to ambulate or tolerate current back pain without IV pain medications.   - PT/OT - dilaudid 1 mg q3h prn  Bipolar Disorder Extensive bipolar history, sees Dr. Robina Ade in the outpatient setting. Home medications include xanax, lamictal, lithium, lyrica, seroquel  - xanax 1 mg bid prn - lamictal 150 mg bid - lithium 675 mg qd and 450 mg qhs  - lyrica 225 mg bid - seroquel 225 mg qhs   Hypertension  - cont.  Home norvasc 10 mg qd   Chronic COPD Uses oxygen at home intermittently since pneumonia last year. He has not been requiring this recently. 93% on RA.  - breo and incruse qd  - duonebs q4h prn   Diet: carb modified  VTE: lovenox  IVF: none, received 1L bolus in the ED Code: full   Dispo: Admit patient to Observation with expected length of stay less than 2 midnights.  SignedMarty Heck, DO 01/30/2019, 4:04 AM  Pager: [phone]

## 2019-01-30 NOTE — Evaluation (Signed)
Occupational Therapy Evaluation Patient Details Name: Jesus Martin MRN: [id number] DOB: 08-03-1961 Today's Date: 01/30/2019    History of Present Illness Pt is a 58 y/o male admitted after fall off of lawn mower. Found to have L1 compression fx. PMH includes bipolar disorder, HTN, and COPD.    Clinical Impression   This 58 y/o male presents with the above. Pt mostly limited due to pain at this time. Pt able to perform room/hallway mobility using RW with minguard assist. He currently requires minA for UB ADL, modA for LB ADL secondary to pain and adhering to back precautions. Educated pt re: back precautions, safety and compensatory strategies for performing ADL and functional transfers with pt verbalizing understanding. Pt reports will have assist from spouse at time of discharge for ADL/iADL PRN. He will benefit from continued OT services both acutely and post acute to maximize his safety and independence with ADL and mobility. Will follow.     Follow Up Recommendations  Home health OT;Supervision/Assistance - 24 hour(24hr initially)    Equipment Recommendations  3 in 1 bedside commode(if pt does not already have, pt reports he thinks he does)           Precautions / Restrictions Precautions Precautions: Back Precaution Booklet Issued: Yes (comment) Precaution Comments: Reviewed back precautions with pt.  Required Braces or Orthoses: Spinal Brace Spinal Brace: Thoracolumbosacral orthotic Restrictions Weight Bearing Restrictions: No Other Position/Activity Restrictions: TLSO was on in supine, upon entry      Mobility Bed Mobility Overal bed mobility: Needs Assistance Bed Mobility: Rolling;Sidelying to Sit;Sit to Sidelying Rolling: Min assist Sidelying to sit: Min assist     Sit to sidelying: Supervision General bed mobility comments: Min A to roll to side and for trunk elevation to come to sitting. Cues for use of log roll technique.   Transfers Overall transfer  level: Needs assistance Equipment used: Rolling walker (2 wheeled) Transfers: Sit to/from Stand Sit to Stand: Min guard         General transfer comment: Min guard for steadying assist. Cues for safe hand placement.     Balance Overall balance assessment: Needs assistance Sitting-balance support: No upper extremity supported;Feet supported Sitting balance-Leahy Scale: Good     Standing balance support: Bilateral upper extremity supported;During functional activity Standing balance-Leahy Scale: Poor Standing balance comment: Reliant on BUE support                            ADL either performed or assessed with clinical judgement   ADL Overall ADL's : Needs assistance/impaired Eating/Feeding: Modified independent;Sitting Eating/Feeding Details (indicate cue type and reason): seated EOB to eat lunch end of session Grooming: Set up;Min guard;Sitting   Upper Body Bathing: Min guard;Minimal assistance;Sitting   Lower Body Bathing: Moderate assistance;Sit to/from stand Lower Body Bathing Details (indicate cue type and reason): educated in use of LH sponge to increase independence with task Upper Body Dressing : Min guard;Minimal assistance;Sitting Upper Body Dressing Details (indicate cue type and reason): pt adjusting TLSO while in supine, recommended reposition as sternal portion of TLSO positioned low but pt declined, reports pain increases with moving TLSO higher Lower Body Dressing: Moderate assistance;Sit to/from stand Lower Body Dressing Details (indicate cue type and reason): due to difficulty reaching LEs, minguard for static balance Toilet Transfer: Min guard;Ambulation;RW Toilet Transfer Details (indicate cue type and reason): simulated via transfer to/from EOB; anticipate will require 3:1 over toilet Toileting- Clothing Manipulation and Hygiene: Minimal assistance;Sit  to/from Nurse, children's Details (indicate cue type and reason): educated on  use of 3:1 as shower seat for increased safety, educated to sponge bathe until he feels safe to perform shower transfer, pt verbalizing understanding/agreement Functional mobility during ADLs: Min guard;Rolling walker General ADL Comments: educated pt re: back precautions, safety and compensatory technique for performing ADL and functional transfers given back precautions; pt limited due to pain     Vision         Perception     Praxis      Pertinent Vitals/Pain Pain Assessment: Faces Pain Score: 10-Worst pain ever Faces Pain Scale: Hurts whole lot Pain Location: back Pain Descriptors / Indicators: Grimacing;Guarding;Sharp Pain Intervention(s): Limited activity within patient's tolerance;Monitored during session;Repositioned     Hand Dominance     Extremity/Trunk Assessment Upper Extremity Assessment Upper Extremity Assessment: Overall WFL for tasks assessed   Lower Extremity Assessment Lower Extremity Assessment: Defer to PT evaluation   Cervical / Trunk Assessment Cervical / Trunk Assessment: Other exceptions Cervical / Trunk Exceptions: L1 compression fx.   Communication Communication Communication: No difficulties   Cognition Arousal/Alertness: Awake/alert Behavior During Therapy: WFL for tasks assessed/performed Overall Cognitive Status: Within Functional Limits for tasks assessed                                     General Comments  Gave handout for how to negotiate stairs with use of RW.     Exercises     Shoulder Instructions      Home Living Family/patient expects to be discharged to:: Private residence Living Arrangements: Spouse/significant other Available Help at Discharge: Family Type of Home: House Home Access: Stairs to enter Technical brewer of Steps: 3 Entrance Stairs-Rails: None Home Layout: One level     Bathroom Shower/Tub: Teacher, early years/pre: Standard     Home Equipment: Bedside commode;Shower  seat          Prior Functioning/Environment Level of Independence: Independent                 OT Problem List: Decreased strength;Decreased range of motion;Decreased activity tolerance;Impaired balance (sitting and/or standing);Decreased knowledge of use of DME or AE;Decreased safety awareness;Obesity;Pain;Decreased knowledge of precautions      OT Treatment/Interventions: Self-care/ADL training;Neuromuscular education;DME and/or AE instruction;Energy conservation;Therapeutic activities;Patient/family education;Balance training    OT Goals(Current goals can be found in the care plan section) Acute Rehab OT Goals Patient Stated Goal: to go on his camping trip soon OT Goal Formulation: With patient Time For Goal Achievement: 02/13/19 Potential to Achieve Goals: Good  OT Frequency: Min 2X/week   Barriers to D/C:            Co-evaluation              AM-PAC OT "6 Clicks" Daily Activity     Outcome Measure Help from another person eating meals?: None Help from another person taking care of personal grooming?: None Help from another person toileting, which includes using toliet, bedpan, or urinal?: A Lot Help from another person bathing (including washing, rinsing, drying)?: A Lot Help from another person to put on and taking off regular upper body clothing?: A Little Help from another person to put on and taking off regular lower body clothing?: A Lot 6 Click Score: 17   End of Session Equipment Utilized During Treatment: Rolling walker;Back brace Nurse Communication: Mobility status(pt  seated EOB)  Activity Tolerance: Patient tolerated treatment well;Patient limited by pain Patient left: with call bell/phone within reach(seated EOB to eat lunch)  OT Visit Diagnosis: Other abnormalities of gait and mobility (R26.89);Pain Pain - part of body: (back)                Time: 9122-5834 OT Time Calculation (min): 17 min Charges:  OT General Charges $OT Visit: 1  Visit OT Evaluation $OT Eval Moderate Complexity: [place], OT [employer] Pager [phone] Office [phone]   Raymondo Band 01/30/2019, 3:01 PM

## 2019-01-30 NOTE — ED Notes (Signed)
Carb modified lunch tray ordered 

## 2019-01-30 NOTE — ED Notes (Signed)
Pt went to CT
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
## 2019-01-30 NOTE — ED Notes (Signed)
MS   Breakfast ordered

## 2019-01-30 NOTE — Progress Notes (Signed)
Orthopedic Tech Progress Note Patient Details:  Jesus Martin [birth year] [id number]  Patient ID: [name], male   DOB: 1961/03/10, 58 y.o.   MRN: [id number] Called in order to [place] 01/30/2019, 12:14 AM

## 2019-01-30 NOTE — Progress Notes (Signed)
Discharge instructions given with stated understanding.  Rolling walker delivered to room.  IV removed with no complications.  Patient waiting for transportation home at this time

## 2019-01-31 LAB — NOVEL CORONAVIRUS, NAA (HOSP ORDER, SEND-OUT TO REF LAB; TAT 18-24 HRS): SARS-CoV-2, NAA: NOT DETECTED

## 2019-02-01 ENCOUNTER — Other Ambulatory Visit: Payer: Self-pay

## 2019-02-01 NOTE — Telephone Encounter (Signed)
RTC to [facility]), she states pt was only given #9 pain pills and only has one pill left.  She is requesting more pain medication to get him through until he see's [facility] on Friday.  Per Larene Beach, the pain medication (oxycodone 5/325) has not helped him that much. Will forward to PCP to advise Request RX to [facility] on [address], RN,BSN

## 2019-02-01 NOTE — Telephone Encounter (Signed)
oxyCODONE-acetaminophen (PERCOCET) 5-325 MG tablet   Refill request @  [place] [address] [place] - [address] [phone] (Phone) [phone] (Fax)

## 2019-02-02 MED ORDER — OXYCODONE-ACETAMINOPHEN 5-325 MG PO TABS: 1.0000 | ORAL_TABLET | Freq: Three times a day (TID) | ORAL | 0 refills | Status: DC | PRN

## 2019-02-02 NOTE — Telephone Encounter (Signed)
Just got to this, I'm on nights currently will increase dose to 5-10 mg PRN and give 4 more days worth

## 2019-02-03 ENCOUNTER — Other Ambulatory Visit: Payer: Self-pay

## 2019-02-03 ENCOUNTER — Ambulatory Visit (INDEPENDENT_AMBULATORY_CARE_PROVIDER_SITE_OTHER): Payer: Medicare HMO | Admitting: Internal Medicine

## 2019-02-03 VITALS — BP 150/92 | HR 106 | Temp 97.8°F | Wt 266.2 lb

## 2019-02-03 DIAGNOSIS — S32010D Wedge compression fracture of first lumbar vertebra, subsequent encounter for fracture with routine healing: Secondary | ICD-10-CM

## 2019-02-03 DIAGNOSIS — S32019D Unspecified fracture of first lumbar vertebra, subsequent encounter for fracture with routine healing: Secondary | ICD-10-CM | POA: Diagnosis not present

## 2019-02-03 DIAGNOSIS — M4850XD Collapsed vertebra, not elsewhere classified, site unspecified, subsequent encounter for fracture with routine healing: Secondary | ICD-10-CM | POA: Diagnosis not present

## 2019-02-03 DIAGNOSIS — W3089XD Contact with other specified agricultural machinery, subsequent encounter: Secondary | ICD-10-CM | POA: Diagnosis not present

## 2019-02-03 DIAGNOSIS — Z79891 Long term (current) use of opiate analgesic: Secondary | ICD-10-CM

## 2019-02-03 DIAGNOSIS — F17211 Nicotine dependence, cigarettes, in remission: Secondary | ICD-10-CM | POA: Diagnosis not present

## 2019-02-03 DIAGNOSIS — E119 Type 2 diabetes mellitus without complications: Secondary | ICD-10-CM

## 2019-02-03 DIAGNOSIS — M810 Age-related osteoporosis without current pathological fracture: Secondary | ICD-10-CM | POA: Diagnosis not present

## 2019-02-03 MED ORDER — METHOCARBAMOL 500 MG PO TABS: 500.0000 mg | ORAL_TABLET | Freq: Four times a day (QID) | ORAL | 1 refills | Status: DC

## 2019-02-03 MED ORDER — DICLOFENAC SODIUM 1 % TD GEL: 2.0000 g | Freq: Four times a day (QID) | TRANSDERMAL | 6 refills | Status: AC

## 2019-02-03 MED ORDER — CALCITONIN (SALMON) 200 UNIT/ACT NA SOLN: 1.0000 | Freq: Every day | NASAL | 0 refills | Status: DC

## 2019-02-03 MED ORDER — OXYCODONE-ACETAMINOPHEN 5-325 MG PO TABS: 1.0000 | ORAL_TABLET | Freq: Three times a day (TID) | ORAL | 0 refills | Status: DC | PRN

## 2019-02-03 NOTE — Patient Instructions (Addendum)
It was nice seeing you today. Thank you for choosing [facility] for your Primary Care.   Today we talked about:   1) Back pain   - pick up medicines from the pharmacy  - you can also take over the counter ibuprofen 467m three times a day   - if you would like physical therapy, I can make the referral    - get the bone density scan done sometime in the next 1-3 months  2) Diabetes  - make an appointment with your doctor in early September to recheck your A1c   FOLLOW-UP INSTRUCTIONS When: early September with PCP For: diabetes What to bring: all medications   Please contact the clinic if you have any problems, or need to be seen sooner.    Spinal Compression Fracture  A spinal compression fracture is a collapse of the bones that form the spine (vertebrae). With this type of fracture, the vertebrae become pushed (compressed) into a wedge shape. Most compression fractures happen in the middle or lower part of the spine. What are the causes? This condition may be caused by:  Thinning and loss of density in the bones (osteoporosis). This is the most common cause.  A fall.  A car or motorcycle accident.  Cancer.  Trauma, such as a heavy, direct hit to the head or back. What increases the risk? You are more likely to develop this condition if:  You are 673years or older.  You have osteoporosis.  You have certain types of cancer, including: ? Multiple myeloma. ? Lymphoma. ? Prostate cancer. ? Lung cancer. ? Breast cancer. What are the signs or symptoms? Symptoms of this condition include:  Severe pain.  Pain that gets worse over time.  Pain that is worse when you stand, walk, sit, or bend.  Sudden pain that is so bad that it is hard for you to move.  Bending or humping of the spine.  Gradual loss of height.  Numbness, tingling, or weakness in the back and legs.  Trouble walking. Your symptoms will depend on the cause of the fracture and how  quickly it develops. How is this diagnosed? This condition may be diagnosed based on symptoms, medical history, and a physical exam. During the physical exam, your health care provider may tap along the length of your spine to check for tenderness. Tests may be done to confirm the diagnosis. They may include:  A bone mineral density test to check for osteoporosis.  Imaging tests, such as a spine X-ray, CT scan, or MRI. How is this treated? Treatment for this condition depends on the cause and severity of the condition. Some fractures may heal on their own with supportive care. Treatment may include:  Pain medicine.  Rest.  A back brace.  Physical therapy exercises.  Medicine to strengthen bone.  Calcium and vitamin D supplements. Fractures that cause the back to become misshapen, cause nerve pain or weakness, or do not respond to other treatment may be treated with surgery. This may include:  Vertebroplasty. Bone cement is injected into the collapsed vertebrae to stabilize them.  Balloon kyphoplasty. The collapsed vertebrae are expanded with a balloon and then bone cement is injected into them.  Spinal fusion. The collapsed vertebrae are connected (fused) to normal vertebrae. Follow these instructions at home: Medicines  Take over-the-counter and prescription medicines only as told by your health care provider.  Do not drive or operate heavy machinery while taking prescription pain medicine.  If you  are taking prescription pain medicine, take actions to prevent or treat constipation. Your health care provider may recommend that you: ? Drink enough fluid to keep your urine pale yellow. ? Eat foods that are high in fiber, such as fresh fruits and vegetables, whole grains, and beans. ? Limit foods that are high in fat and processed sugars, such as fried or sweet foods. ? Take an over-the-counter or prescription medicine for constipation. If you have a brace:  Wear the brace as  told by your health care provider. Remove it only as told by your health care provider.  Loosen the brace if your fingers or toes tingle, become numb, or turn cold and blue.  Keep the brace clean.  If the brace is not waterproof: ? Do not let it get wet. ? Cover it with a watertight covering when you take a bath or a shower. Managing pain, stiffness, and swelling   If directed, apply ice to the injured area: ? If you have a removable brace, remove it as told by your health care provider. ? Put ice in a plastic bag. ? Place a towel between your skin and the bag. ? Leave the ice on for 30 minutes every two hours at first. Then apply the ice as needed. Activity  Rest as told by your health care provider. ? Avoid sitting for a long time without moving. Get up to take short walks every 1-2 hours. This is important to improve blood flow and breathing. Ask for help if you feel weak or unsteady.  Return to your normal activities as directed by your health care provider. Ask what activities are safe for you.  Do exercises to improve motion and strength in your back (physical therapy), as recommended by your health care provider.  Exercise regularly as directed by your health care provider. General instructions   Do not drink alcohol. Alcohol can interfere with your treatment.  Do not use any products that contain nicotine or tobacco, such as cigarettes and e-cigarettes. These can delay bone healing. If you need help quitting, ask your health care provider.  Keep all follow-up visits as told by your health care provider. This is important. It can help to prevent permanent injury, disability, and long-lasting (chronic) pain. Contact a health care provider if:  You have a fever.  You develop a cough that makes your pain worse.  Your pain medicine is not helping.  Your pain does not get better over time.  You cannot return to your normal activities as planned or expected. Get help  right away if:  Your pain is very bad and it suddenly gets worse.  You are unable to move any body part (paralysis) that is below the level of your injury.  You have numbness, tingling, or weakness in any body part that is below the level of your injury.  You cannot control your bladder or bowels. Summary  A spinal compression fracture is a collapse of the bones that form the spine (vertebrae).  With this type of fracture, the vertebrae become pushed (compressed) into a wedge shape.  Your symptoms and treatment will depend on the cause and severity of the fracture and how quickly it develops.  Some fractures may heal on their own with supportive care. Fractures that cause the back to become misshapen, cause nerve pain or weakness, or do not respond to other treatment may be treated with surgery. This information is not intended to replace advice given to you by your health  care provider. Make sure you discuss any questions you have with your health care provider. Document Released: 07/27/2005 Document Revised: 09/07/2017 Document Reviewed: 09/07/2017 Elsevier Interactive Patient Education  2019 [employer].

## 2019-02-03 NOTE — Progress Notes (Signed)
   CC: hospital f/u for acute L1 compression fracture   HPI:  Jesus Martin is a 58 y.o. with PMHx as listed below who presents for hospital f/u after admission for acute L1 compression fracture sustained from falling off his lawn mower. He was discharged from the hospital 5 days ago and reports being in severe pain. He was discharged with a rolling walker, tramadol and oxycodone but the tramadol made him nauseous so he hasn't been taking it. The oxycodone brings the pain down from a 10 to an 8. He has been taking 47m q4h prn and had the oxycodone refilled yesterday.He has also been wearing a back brace. HH PT was recommended at hospital discharge but patient declined. He was also offered outpatient PT and declined.   His appetite has remained poor but he is able to drink liquids. His last BM was yesterday. Denies n/v, fevers, chills.   His fall from the lawn mower occurred when he ran over a bag of soil and the lawn mower flipped, landing on his leg. He denies h/o vit D def, smokes 3-4 cigarettes a day but quit two weeks ago.    Past Medical History:  Diagnosis Date  . Bipolar I disorder (HCC) 1991   Dr. KToy Care . Borderline hyperlipidemia    history of  . Childhood asthma   . COPD (chronic obstructive pulmonary disease) ([place]   . Diabetes mellitus, new onset ([place] 12/22/2017  . Elevated glucose   . Fibromyalgia   . GERD (gastroesophageal reflux disease)   . HTN (hypertension)   . Low vitamin B12 level 06/07/2018  . Lower back pain   . Paranoia ([place]   . Tobacco abuse   . Tobacco abuse   . Vitamin D deficiency     Physical Exam:  Vitals:   02/03/19 1007  BP: (!) 150/92  Pulse: (!) 106  Temp: 97.8 F (36.6 C)  TempSrc: Oral  SpO2: 97%  Weight: 266 lb 3.2 oz (120.7 kg)   Gen: uncomfortable appearing, in back brace with rolling walker, NAD HENT: moist MM MSK: very TTP of lumbar spine, limited rom due to pain Neuro: normal gait with rolling walker   Assessment &  Plan:   See Encounters Tab for problem based charting.  Patient discussed with Dr. EAngelia Mould

## 2019-02-04 ENCOUNTER — Encounter: Payer: Self-pay | Admitting: Internal Medicine

## 2019-02-04 LAB — VITAMIN D 25 HYDROXY (VIT D DEFICIENCY, FRACTURES): Vit D, 25-Hydroxy: 34.9 ng/mL (ref 30.0–100.0)

## 2019-02-04 NOTE — Assessment & Plan Note (Addendum)
Acute. 2/2 low impact fall from lawn mower. Pain uncontrolled. Oxycodone helps. Counseled on benefits of PT but patient continues to decline due to cost. Wife used to work in PT/OT so I encouraged them to research exercises to do at home. Tolerating liquid po but no appetite for solid food. Last BM 1 day ago, cousneled on risk of constipation due to decreased mobility and oxycodone use. Given low impact fracture, will work up for osteoporosis.   - Refill oxycodone-acetaminophen 5-359m, 22 tablets - Rx intranasal calcitonin  - Rx methocarbamol 5063mfour times daily  - Rx voltaren gel  - ibuprofen 40040mid  - recommend OTC miralax, goal is one BM every other day  - offered PT, patient declined  - f/u in one week if pain not improved  - vit D, DEXA scan

## 2019-02-06 ENCOUNTER — Ambulatory Visit: Payer: Medicare HMO | Admitting: Podiatry

## 2019-02-06 NOTE — Progress Notes (Signed)
Internal Medicine Clinic Attending  Case discussed with Dr. Donne Hazel at the time of the visit.  We reviewed the resident's history and exam and pertinent patient test results.  I agree with the assessment, diagnosis, and plan of care documented in the resident's note.

## 2019-02-07 ENCOUNTER — Other Ambulatory Visit: Payer: Self-pay

## 2019-02-07 ENCOUNTER — Ambulatory Visit (INDEPENDENT_AMBULATORY_CARE_PROVIDER_SITE_OTHER): Payer: Medicare HMO | Admitting: Internal Medicine

## 2019-02-07 ENCOUNTER — Telehealth: Payer: Self-pay

## 2019-02-07 DIAGNOSIS — Z79899 Other long term (current) drug therapy: Secondary | ICD-10-CM

## 2019-02-07 DIAGNOSIS — Z79891 Long term (current) use of opiate analgesic: Secondary | ICD-10-CM | POA: Diagnosis not present

## 2019-02-07 DIAGNOSIS — M4856XD Collapsed vertebra, not elsewhere classified, lumbar region, subsequent encounter for fracture with routine healing: Secondary | ICD-10-CM

## 2019-02-07 DIAGNOSIS — S32010D Wedge compression fracture of first lumbar vertebra, subsequent encounter for fracture with routine healing: Secondary | ICD-10-CM

## 2019-02-07 MED ORDER — OXYCODONE-ACETAMINOPHEN 10-325 MG PO TABS: 1.0000 | ORAL_TABLET | ORAL | 0 refills | Status: DC | PRN

## 2019-02-07 MED ORDER — LIDOCAINE 5 % EX PTCH: 1.0000 | MEDICATED_PATCH | Freq: Two times a day (BID) | CUTANEOUS | 0 refills | Status: AC

## 2019-02-07 MED ORDER — METHOCARBAMOL 500 MG PO TABS: 1000.0000 mg | ORAL_TABLET | Freq: Four times a day (QID) | ORAL | 0 refills | Status: AC

## 2019-02-07 NOTE — Telephone Encounter (Signed)
Pt's wife called back, pt is in severe pain, cried all night, using the topical pain cream, taking pain med as instructed, nothing helps. Offered ED or clinic appt. They both prefer clinic appt. ACC 6/30 at 1515

## 2019-02-07 NOTE — Telephone Encounter (Signed)
Pt's wife states her husband is in pain, unable to sleep. Requesting to speak with a nurse about pain med. Please call back.

## 2019-02-07 NOTE — Patient Instructions (Addendum)
Mr. Koskela, Today we discussed your back pain.  I am increasing the dose and frequency of your Percocet.  I'd also like you to double the dose of the Robaxin (muscle relaxant) to 1000 mg 4 times daily.  I have prescribed lidocaine patches to place on the areas of muscle tightness as well.   Please call Dr. Clarice Pole office at [phone] for an appointment in the next 1-2 weeks to discuss back brace and any further recommendations.    Hope you feel better! Dr. Koleen Distance

## 2019-02-07 NOTE — Telephone Encounter (Signed)
Thank you, we will see him then.

## 2019-02-09 ENCOUNTER — Other Ambulatory Visit: Payer: Self-pay

## 2019-02-09 ENCOUNTER — Ambulatory Visit (INDEPENDENT_AMBULATORY_CARE_PROVIDER_SITE_OTHER): Payer: Medicare HMO | Admitting: Internal Medicine

## 2019-02-09 DIAGNOSIS — S32010D Wedge compression fracture of first lumbar vertebra, subsequent encounter for fracture with routine healing: Secondary | ICD-10-CM

## 2019-02-09 DIAGNOSIS — M4850XD Collapsed vertebra, not elsewhere classified, site unspecified, subsequent encounter for fracture with routine healing: Secondary | ICD-10-CM

## 2019-02-09 DIAGNOSIS — Z79891 Long term (current) use of opiate analgesic: Secondary | ICD-10-CM

## 2019-02-09 MED ORDER — OXYCODONE-ACETAMINOPHEN 10-325 MG PO TABS: 1.0000 | ORAL_TABLET | ORAL | 0 refills | Status: DC | PRN

## 2019-02-09 NOTE — Progress Notes (Signed)
  [facility] Residency Telephone Encounter Continuity Care Appointment  HPI:   This telephone encounter was created for [name] on 02/09/2019 for the following purpose/cc: follow up of lowe back pain.   Past Medical History:  Past Medical History:  Diagnosis Date  . Bipolar I disorder (HCC) 1991   Dr. Toy Care  . Borderline hyperlipidemia    history of  . Childhood asthma   . COPD (chronic obstructive pulmonary disease) ([place])   . Diabetes mellitus, new onset ([place]) 12/22/2017  . Elevated glucose   . Fibromyalgia   . GERD (gastroesophageal reflux disease)   . HTN (hypertension)   . Low vitamin B12 level 06/07/2018  . Lower back pain   . Paranoia ([place])   . Tobacco abuse   . Tobacco abuse   . Vitamin D deficiency       ROS:      Assessment / Plan / Recommendations:   Please see A&P under problem oriented charting for assessment of the patient's acute and chronic medical conditions.   As always, pt is advised that if symptoms worsen or new symptoms arise, they should go to an urgent care facility or to to ER for further evaluation.   Consent and Medical Decision Making:   Patient discussed with Dr. Dareen Piano  This is a telephone encounter between Jesus Martin and Jesus Martin on 02/09/2019 for follow up of low back pain 2/2 compression Fx of L1. The visit was conducted with the patient located at home and [facility]. The patient's identity was confirmed using their DOB and current address. The patient has consented to being evaluated through a telephone encounter and understands the associated risks (an examination cannot be done and the patient may need to come in for an appointment) / benefits (allows the patient to remain at home, decreasing exposure to coronavirus). I personally spent 15 minutes on medical discussion.

## 2019-02-09 NOTE — Assessment & Plan Note (Addendum)
(  Telehealth) Patient mentions that his low back pain is a little bit better after getting higher dose of pain medication and muscle relaxant ( Robaxin  Increased to 1000 mg 4 times daily and Oxycodone-Acetaminophen increased to 10-375 q 4h PRN by Dr. Koleen Distance on 6/30 and recomended to be reevaluated in few days.)  He does not get physical therapy currently despite recommendtion. His wife mentions that she is going to call Dr. Clarice Pole office to get a follow up appointment for next week and may then start PT.  Jesus Martin takes Miralax and deos not report constipation while on Percocet.  -Continue Percocet 10/375 mg every 4 hours PRN. (wife mentions that he could only get 5 days supply per insurance approval.).  Her previous prescription will last until Saturday 02/11/2019.  I sent a refill to pharmacy for prescription for another 5 days, with starting date: 02/12/2019.  -Continue methocarbamol -Continue MiraLAX -Recommending to continue using brace -F/u with Dr. Ellene Route

## 2019-02-11 DIAGNOSIS — J449 Chronic obstructive pulmonary disease, unspecified: Secondary | ICD-10-CM | POA: Diagnosis not present

## 2019-02-12 ENCOUNTER — Encounter: Payer: Self-pay | Admitting: Internal Medicine

## 2019-02-12 NOTE — Progress Notes (Signed)
   CC: acute L1 compression fracture   HPI:  [name] is a 58 y.o. gentleman with PMHx listed below who presents for follow-up of L1 compression fracture. Please see problem based charting for assessment and plan.   Past Medical History:  Diagnosis Date  . Bipolar I disorder (HCC) 1991   Jesus Martin  . Borderline hyperlipidemia    history of  . Childhood asthma   . COPD (chronic obstructive pulmonary disease) ([place])   . Diabetes mellitus, new onset ([place]) 12/22/2017  . Elevated glucose   . Fibromyalgia   . GERD (gastroesophageal reflux disease)   . HTN (hypertension)   . Low vitamin B12 level 06/07/2018  . Lower back pain   . Paranoia ([place])   . Tobacco abuse   . Tobacco abuse   . Vitamin D deficiency    Review of Systems:    Physical Exam:  Vitals:   02/07/19 1458  BP: (!) 156/101  Pulse: 98  Temp: 98 F (36.7 C)  SpO2: 97%  Weight: 262 lb (118.8 kg)  Height: 6' 1"  (1.854 m)   General: A&Ox3, sitting in back brace, appears uncomfortable MSK: paraspinal muscles and lumbar spine TTP; limited ROM due to pain Neuro: able to rise from wheelchair and ambulate with rolling walker. No sensory deficits.   Assessment & Plan:   See Encounters Tab for problem based charting.  Patient discussed with Jesus Martin

## 2019-02-12 NOTE — Assessment & Plan Note (Signed)
Last seen in clinic on 6/26 at which time Robaxin, Voltaren, and intranasal calcitonin were added to Percocet 5-300 mg TID.  Pain remains uncontrolled. Not sleeping well due to pain. No red flag symptoms of fevers, chills, numbness/tingling, focal weakness, bowel or bladder incontinence. He is having regular BMs with Miralax.   - increase to Percocet 10-325 q 4 hours - increase Robaxin to 1000 mg QID - Rx for lidocaine patches to help with muscle pain - continue bowel regimen - provided information for Dr. Clarice Pole office to schedule follow-up in 1-2 weeks as indicated in consult note from hospitalization

## 2019-02-13 NOTE — Progress Notes (Signed)
Internal Medicine Clinic Attending  Case discussed with Dr. Masoudi  at the time of the visit.  We reviewed the resident's history and exam and pertinent patient test results.  I agree with the assessment, diagnosis, and plan of care documented in the resident's note.  

## 2019-02-15 ENCOUNTER — Encounter: Payer: Self-pay | Admitting: Internal Medicine

## 2019-02-15 ENCOUNTER — Ambulatory Visit (INDEPENDENT_AMBULATORY_CARE_PROVIDER_SITE_OTHER): Payer: Medicare HMO | Admitting: Internal Medicine

## 2019-02-15 ENCOUNTER — Other Ambulatory Visit: Payer: Self-pay

## 2019-02-15 DIAGNOSIS — E119 Type 2 diabetes mellitus without complications: Secondary | ICD-10-CM

## 2019-02-15 DIAGNOSIS — Z794 Long term (current) use of insulin: Secondary | ICD-10-CM | POA: Diagnosis not present

## 2019-02-15 DIAGNOSIS — G4700 Insomnia, unspecified: Secondary | ICD-10-CM | POA: Diagnosis not present

## 2019-02-15 DIAGNOSIS — Z79891 Long term (current) use of opiate analgesic: Secondary | ICD-10-CM | POA: Diagnosis not present

## 2019-02-15 DIAGNOSIS — B029 Zoster without complications: Secondary | ICD-10-CM | POA: Diagnosis not present

## 2019-02-15 DIAGNOSIS — S32010D Wedge compression fracture of first lumbar vertebra, subsequent encounter for fracture with routine healing: Secondary | ICD-10-CM

## 2019-02-15 DIAGNOSIS — M4856XD Collapsed vertebra, not elsewhere classified, lumbar region, subsequent encounter for fracture with routine healing: Secondary | ICD-10-CM | POA: Diagnosis not present

## 2019-02-15 MED ORDER — OXYCODONE-ACETAMINOPHEN 10-325 MG PO TABS: 1.0000 | ORAL_TABLET | ORAL | 0 refills | Status: AC | PRN

## 2019-02-15 NOTE — Assessment & Plan Note (Signed)
Patient continues to take Percocet 10-334m q4h. Pain remains uncontrolled and patient reports insomnia secondary to pain. No fever, chills, focal weakness, bowel or bladder incontinence.   - Refill to pharmacy for Percocet 10-325 q4h for 5 days to be filled on 7/10  - Patient will follow up with neurosurgeon next week

## 2019-02-15 NOTE — Patient Instructions (Addendum)
[name],  It was a pleasure seeing you in clinic today. Today, you came in for concerns of a rash along the right side of your abdomen extending to your back. We believe this is a case of shingles. At this point, we believe that your symptoms will improve with time. If there is worsening of your symptoms or if they persist, please contact us.   Shingles  Shingles is an infection. It gives you a painful skin rash and blisters that have fluid in them. Shingles is caused by the same germ (virus) that causes chickenpox. Shingles only happens in people who:  Have had chickenpox.  Have been given a shot of medicine (vaccine) to protect against chickenpox. Shingles is rare in this group. The first symptoms of shingles may be itching, tingling, or pain in an area on your skin. A rash will show on your skin a few days or weeks later. The rash is likely to be on one side of your body. The rash usually has a shape like a belt or a band. Over time, the rash turns into fluid-filled blisters. The blisters will break open, change into scabs, and dry up. Medicines may:  Help with pain and itching.  Help you get better sooner.  Help to prevent long-term problems. Follow these instructions at home: Medicines  Take over-the-counter and prescription medicines only as told by your doctor.  Put on an anti-itch cream or numbing cream where you have a rash, blisters, or scabs. Do this as told by your doctor. Helping with itching and discomfort   Put cold, wet cloths (cold compresses) on the area of the rash or blisters as told by your doctor.  Cool baths can help you feel better. Try adding baking soda or dry oatmeal to the water to lessen itching. Do not bathe in hot water. Blister and rash care  Keep your rash covered with a loose bandage (dressing).  Wear loose clothing that does not rub on your rash.  Keep your rash and blisters clean. To do this, wash the area with mild soap and cool water as  told by your doctor.  Check your rash every day for signs of infection. Check for: ? More redness, swelling, or pain. ? Fluid or blood. ? Warmth. ? Pus or a bad smell.  Do not scratch your rash. Do not pick at your blisters. To help you to not scratch: ? Keep your fingernails clean and cut short. ? Wear gloves or mittens when you sleep, if scratching is a problem. General instructions  Rest as told by your doctor.  Keep all follow-up visits as told by your doctor. This is important.  Wash your hands often with soap and water. If soap and water are not available, use hand sanitizer. Doing this lowers your chance of getting a skin infection caused by germs (bacteria).  Your infection can cause chickenpox in people who have never had chickenpox or never got a shot of chickenpox vaccine. If you have blisters that did not change into scabs yet, try not to touch other people or be around other people, especially: ? Babies. ? Pregnant women. ? Children who have areas of red, itchy, or rough skin (eczema). ? Very old people who have transplants. ? People who have a long-term (chronic) sickness, like cancer or AIDS. Contact a doctor if:  Your pain does not get better with medicine.  Your pain does not get better after the rash heals.  You have any signs of  infection in the rash area. These signs include: ? More redness, swelling, or pain around the rash. ? Fluid or blood coming from the rash. ? The rash area feeling warm to the touch. ? Pus or a bad smell coming from the rash. Get help right away if:  The rash is on your face or nose.  You have pain in your face or pain by your eye.  You lose feeling on one side of your face.  You have trouble seeing.  You have ear pain, or you have ringing in your ear.  You have a loss of taste.  Your condition gets worse. Summary  Shingles gives you a painful skin rash and blisters that have fluid in them.  Shingles is an infection. It  is caused by the same germ (virus) that causes chickenpox.  Keep your rash covered with a loose bandage (dressing). Wear loose clothing that does not rub on your rash.  If you have blisters that did not change into scabs yet, try not to touch other people or be around people. This information is not intended to replace advice given to you by your health care provider. Make sure you discuss any questions you have with your health care provider. Document Released: 01/13/2008 Document Revised: 11/18/2018 Document Reviewed: 03/31/2017 Elsevier Patient Education  2020 [employer].

## 2019-02-15 NOTE — Assessment & Plan Note (Signed)
Patient reports a four-day history of pain extending from the navel to his right abdomen and back. Two days ago, the patient noticed a rash in the area that he reports was vesicular yesterday. Since then, the vesicles have crusted over. Of note, patient has a history of chicken pox as a child and has not received a shingles vaccine. On exam, there is are clusters of erythematous papules extending along the right abdomen to the back along the T10-T11 dermatome. Given that his symptoms have surpassed the 72 hour window during which valacyclovir is beneficial, we will hold off on valacyclovir and recommend symptomatic relief of itching and pain.   - Patient will follow up as needed if symptoms worsen

## 2019-02-15 NOTE — Progress Notes (Signed)
   CC: rash with back pain  HPI:  Jesus Martin is a 58 y.o. male with a PMHx of type 2 DM on insulin with recent compression fracture of L1 vertebrae presenting to clinic with rash and pain in dermatomal distribution extending from navel to back on the right side. The patient reports pain in the area four days ago and first noticed the rash two days ago. As per wife, there were vesicles yesterday that have crusted over today. He reports that the pain is only in the distribution of the rash and does not radiate. Patient has a history of chicken pox as a child and has not received the shingles vaccine. He denies any fever/chills, headache, dizziness, myalgias, or recent sick contacts.    Past Medical History:  Diagnosis Date  . Bipolar I disorder (HCC) 1991   Dr. Toy Care  . Borderline hyperlipidemia    history of  . Childhood asthma   . COPD (chronic obstructive pulmonary disease) ([place])   . Diabetes mellitus, new onset ([place]) 12/22/2017  . Elevated glucose   . Fibromyalgia   . GERD (gastroesophageal reflux disease)   . HTN (hypertension)   . Low vitamin B12 level 06/07/2018  . Lower back pain   . Paranoia ([place])   . Tobacco abuse   . Tobacco abuse   . Vitamin D deficiency    Review of Systems:  Review of Systems  Constitutional: Negative for chills, fever and malaise/fatigue.  Respiratory: Negative for shortness of breath.   Cardiovascular: Negative for chest pain.  Gastrointestinal: Negative for abdominal pain, nausea and vomiting.  Musculoskeletal: Negative for joint pain and myalgias.  Skin: Positive for itching and rash.       On right side, extending from navel to mid back.   Neurological: Negative for dizziness, tingling, sensory change, focal weakness, weakness and headaches.     Physical Exam:  Vitals:   02/15/19 1326  BP: (!) 157/92  Pulse: (!) 107  Temp: 98.2 F (36.8 C)  TempSrc: Oral  SpO2: 94%  Weight: 256 lb (116.1 kg)  Height: 6' 1"  (1.854 m)    Physical Exam  Constitutional: He is oriented to person, place, and time and well-developed, well-nourished, and in no distress.  HENT:  Head: Normocephalic and atraumatic.  Cardiovascular: Normal rate, regular rhythm, normal heart sounds and intact distal pulses. Exam reveals no gallop and no friction rub.  No murmur heard. Pulmonary/Chest: Effort normal and breath sounds normal. No respiratory distress. He has no wheezes. He has no rales.  Abdominal: Bowel sounds are normal.  Musculoskeletal: Normal range of motion.  Neurological: He is alert and oriented to person, place, and time.  Skin: Skin is warm and dry. Rash noted. Rash is papular. There is erythema.     Erythematous papular rash along T10-T11 dermatome in clusters  Some crusting noted.      Assessment & Plan:   See Encounters Tab for problem based charting.  Patient seen with Dr. Angelia Mould

## 2019-02-15 NOTE — Progress Notes (Signed)
Internal Medicine Clinic Attending  Case discussed with Dr. Bloomfield at the time of the visit.  We reviewed the resident's history and exam and pertinent patient test results.  I agree with the assessment, diagnosis, and plan of care documented in the resident's note.  Alexander Raines, M.D., Ph.D.  

## 2019-02-21 NOTE — Progress Notes (Signed)
Internal Medicine Clinic Attending  I saw and evaluated the patient.  I personally confirmed the key portions of the history and exam documented by Dr. Marva Panda and I reviewed pertinent patient test results.  The assessment, diagnosis, and plan were formulated together and I agree with the documentation in the resident's note.

## 2019-02-22 DIAGNOSIS — S32010D Wedge compression fracture of first lumbar vertebra, subsequent encounter for fracture with routine healing: Secondary | ICD-10-CM | POA: Diagnosis not present

## 2019-02-23 ENCOUNTER — Emergency Department (HOSPITAL_COMMUNITY): Payer: Medicare HMO

## 2019-02-23 ENCOUNTER — Encounter (HOSPITAL_COMMUNITY): Payer: Self-pay

## 2019-02-23 ENCOUNTER — Emergency Department (HOSPITAL_COMMUNITY)
Admission: EM | Admit: 2019-02-23 | Discharge: 2019-02-23 | Disposition: A | Discharge status: Home / Self Care | Payer: Medicare HMO | Attending: Emergency Medicine | Admitting: Emergency Medicine

## 2019-02-23 ENCOUNTER — Other Ambulatory Visit: Payer: Self-pay

## 2019-02-23 ENCOUNTER — Telehealth: Payer: Self-pay | Admitting: *Deleted

## 2019-02-23 DIAGNOSIS — M549 Dorsalgia, unspecified: Secondary | ICD-10-CM

## 2019-02-23 DIAGNOSIS — I10 Essential (primary) hypertension: Secondary | ICD-10-CM | POA: Diagnosis not present

## 2019-02-23 DIAGNOSIS — M5489 Other dorsalgia: Secondary | ICD-10-CM | POA: Diagnosis not present

## 2019-02-23 DIAGNOSIS — M546 Pain in thoracic spine: Secondary | ICD-10-CM | POA: Diagnosis not present

## 2019-02-23 DIAGNOSIS — S32010D Wedge compression fracture of first lumbar vertebra, subsequent encounter for fracture with routine healing: Secondary | ICD-10-CM | POA: Diagnosis not present

## 2019-02-23 DIAGNOSIS — B029 Zoster without complications: Secondary | ICD-10-CM

## 2019-02-23 DIAGNOSIS — R109 Unspecified abdominal pain: Secondary | ICD-10-CM

## 2019-02-23 DIAGNOSIS — R52 Pain, unspecified: Secondary | ICD-10-CM | POA: Diagnosis not present

## 2019-02-23 DIAGNOSIS — R1011 Right upper quadrant pain: Secondary | ICD-10-CM | POA: Diagnosis not present

## 2019-02-23 DIAGNOSIS — M545 Low back pain: Secondary | ICD-10-CM | POA: Diagnosis not present

## 2019-02-23 DIAGNOSIS — R1032 Left lower quadrant pain: Secondary | ICD-10-CM | POA: Insufficient documentation

## 2019-02-23 DIAGNOSIS — R609 Edema, unspecified: Secondary | ICD-10-CM | POA: Diagnosis not present

## 2019-02-23 DIAGNOSIS — R1031 Right lower quadrant pain: Secondary | ICD-10-CM | POA: Diagnosis not present

## 2019-02-23 LAB — CBC WITH DIFFERENTIAL/PLATELET
Abs Immature Granulocytes: 0.02 10*3/uL (ref 0.00–0.07)
Basophils Absolute: 0.1 10*3/uL (ref 0.0–0.1)
Basophils Relative: 1 %
Eosinophils Absolute: 0.4 10*3/uL (ref 0.0–0.5)
Eosinophils Relative: 4 %
HCT: 44.2 % (ref 39.0–52.0)
Hemoglobin: 14.7 g/dL (ref 13.0–17.0)
Immature Granulocytes: 0 %
Lymphocytes Relative: 43 %
Lymphs Abs: 4.1 10*3/uL — ABNORMAL HIGH (ref 0.7–4.0)
MCH: 30.4 pg (ref 26.0–34.0)
MCHC: 33.3 g/dL (ref 30.0–36.0)
MCV: 91.5 fL (ref 80.0–100.0)
Monocytes Absolute: 1 10*3/uL (ref 0.1–1.0)
Monocytes Relative: 10 %
Neutro Abs: 4.1 10*3/uL (ref 1.7–7.7)
Neutrophils Relative %: 42 %
Platelets: 275 10*3/uL (ref 150–400)
RBC: 4.83 MIL/uL (ref 4.22–5.81)
RDW: 12.2 % (ref 11.5–15.5)
WBC: 9.6 10*3/uL (ref 4.0–10.5)
nRBC: 0 % (ref 0.0–0.2)

## 2019-02-23 LAB — LIPASE, BLOOD: Lipase: 24 U/L (ref 11–51)

## 2019-02-23 LAB — COMPREHENSIVE METABOLIC PANEL
ALT: 28 U/L (ref 0–44)
AST: 23 U/L (ref 15–41)
Albumin: 4.3 g/dL (ref 3.5–5.0)
Alkaline Phosphatase: 74 U/L (ref 38–126)
Anion gap: 13 (ref 5–15)
BUN: 9 mg/dL (ref 6–20)
CO2: 22 mmol/L (ref 22–32)
Calcium: 10.1 mg/dL (ref 8.9–10.3)
Chloride: 106 mmol/L (ref 98–111)
Creatinine, Ser: 0.93 mg/dL (ref 0.61–1.24)
GFR calc Af Amer: >60 mL/min (ref >60)
GFR calc non Af Amer: >60 mL/min (ref >60)
Glucose, Bld: 100 mg/dL — ABNORMAL HIGH (ref 70–99)
Potassium: 3.7 mmol/L (ref 3.5–5.1)
Sodium: 141 mmol/L (ref 135–145)
Total Bilirubin: 0.6 mg/dL (ref 0.3–1.2)
Total Protein: 7.4 g/dL (ref 6.5–8.1)

## 2019-02-23 MED ORDER — VALACYCLOVIR HCL 1 G PO TABS: 1000.0000 mg | ORAL_TABLET | Freq: Three times a day (TID) | ORAL | 0 refills | Status: DC

## 2019-02-23 MED ORDER — FENTANYL CITRATE (PF) 100 MCG/2ML IJ SOLN
50.0000 ug | Freq: Once | INTRAMUSCULAR | Status: AC
  Administered 2019-02-23: 50 ug via INTRAVENOUS
  Filled 2019-02-23: qty 2

## 2019-02-23 MED ORDER — IOHEXOL 300 MG/ML  SOLN
100.0000 mL | Freq: Once | INTRAMUSCULAR | Status: AC | PRN
  Administered 2019-02-23: 100 mL via INTRAVENOUS

## 2019-02-23 MED ORDER — CYCLOBENZAPRINE HCL 10 MG PO TABS
10.0000 mg | ORAL_TABLET | Freq: Once | ORAL | Status: AC
  Administered 2019-02-23: 10 mg via ORAL
  Filled 2019-02-23: qty 1

## 2019-02-23 MED ORDER — HYDROCODONE-ACETAMINOPHEN 5-325 MG PO TABS: 1.0000 | ORAL_TABLET | Freq: Once | ORAL | Status: DC

## 2019-02-23 NOTE — ED Provider Notes (Addendum)
[facility] EMERGENCY DEPARTMENT Provider Note   CSN: [id number] Arrival date & time: 02/23/19  1249    History   Chief Complaint Chief Complaint  Patient presents with  . Abdominal Pain  . Back Pain    HPI Jesus Martin is a 58 y.o. male.     The history is provided by the patient.  Abdominal Pain Pain location:  R flank, RUQ and RLQ Pain quality: aching   Pain radiates to:  Does not radiate Pain severity:  Mild Onset quality:  Gradual Duration:  1 day Timing:  Constant Progression:  Unchanged Chronicity:  New Context comment:  Recent L1 fx and rash to his side that was dx as shingles who presents with ongoing right flank pain and mid back pain. Saw spine yesterday and doing well. 30 percent hieght loss seen on xray yesterday. No longer in brace. No longer with pain meds.  Relieved by:  Nothing Ineffective treatments:  None tried Associated symptoms: no anorexia, no chest pain, no chills, no constipation, no cough, no dysuria, no fever, no hematuria, no nausea, no shortness of breath, no sore throat and no vomiting   Risk factors: has not had multiple surgeries     Past Medical History:  Diagnosis Date  . Bipolar I disorder (HCC) 1991   Dr. Toy Care  . Borderline hyperlipidemia    history of  . Childhood asthma   . COPD (chronic obstructive pulmonary disease) ([place])   . Diabetes mellitus, new onset ([place]) 12/22/2017  . Elevated glucose   . Fibromyalgia   . GERD (gastroesophageal reflux disease)   . HTN (hypertension)   . Low vitamin B12 level 06/07/2018  . Lower back pain   . Lumbar vertebral fracture (HCC) 01/29/2019   L1  . Paranoia ([place])   . Tobacco abuse   . Tobacco abuse   . Vitamin D deficiency     Patient Active Problem List   Diagnosis Date Noted  . Shingles 02/15/2019  . Compression fracture of spine ([place]) 01/30/2019  . Serum calcium elevated 01/10/2019  . Daytime somnolence 01/10/2019  . Beta-hemolytic group A streptococcal  sepsis ([place])   . Hypokalemia   . Bipolar disorder in full remission ([place])   . Leukocytosis   . Influenza A   . Multifocal pneumonia 10/07/2018  . Hyperlipidemia 09/26/2018  . Foot pain, bilateral 09/26/2018  . Diabetic neuropathy ([place]) 12/22/2017  . Chronic pain 11/24/2017  . Hepatic steatosis 11/24/2017  . COPD (chronic obstructive pulmonary disease) ([place]) 11/24/2017  . Diabetes ([place])   . Acute respiratory failure with hypoxia ([place])   . Bipolar 1 disorder ([place]) 04/17/2011  . Hypertension 04/17/2011  . Former smoker 04/17/2011    Past Surgical History:  Procedure Laterality Date  . APPENDECTOMY     1975  . BACK SURGERY     1998  . NOSE SURGERY          Home Medications    Prior to Admission medications   Medication Sig Start Date End Date Taking? Authorizing Provider  ACCU-CHEK FASTCLIX LANCETS MISC Check blood sugar 3 times a day. 08/17/18   Katherine Roan, MD  acetaminophen (TYLENOL) 325 MG tablet Take 325-650 mg by mouth every 6 (six) hours as needed (for pain or headaches).    [provider]  alprazolam Duanne Moron) 2 MG tablet Take 2 mg by mouth at bedtime as needed for sleep or anxiety.  09/17/18   [provider]  amLODipine (NORVASC) 10 MG tablet  TAKE 1 TABLET BY MOUTH ONCE DAILY Patient taking differently: Take 10 mg by mouth daily.  03/11/18   Katherine Roan, MD  atorvastatin (LIPITOR) 40 MG tablet Take 1 tablet (40 mg total) by mouth daily at 6 PM. 03/31/18   Shan Levans Jenne Pane, MD  blood glucose meter kit and supplies KIT Dispense based on patient and insurance preference. Use up to four times daily as directed. (FOR ICD-9 250.00, 250.01). 09/28/17   Nita Sells, MD  Blood Glucose Monitoring Suppl (ACCU-CHEK GUIDE) w/Device KIT 1 each by Does not apply route 3 (three) times daily. 08/17/18   Katherine Roan, MD  calcitonin, salmon, (MIACALCIN/FORTICAL) 200 UNIT/ACT nasal spray Place 1 spray into alternate nostrils daily. 02/03/19   Isabelle Course, MD  diclofenac sodium (VOLTAREN) 1 % GEL Apply 2 g topically 4 (four) times daily. 02/03/19   Isabelle Course, MD  diphenhydrAMINE (BENADRYL) 25 mg capsule Take 25 mg by mouth every 8 (eight) hours as needed for allergies.    [provider]  EPINEPHrine (EPIPEN 2-PAK) 0.3 mg/0.3 mL IJ SOAJ injection Inject 0.3 mg into the muscle once as needed (for anaphylactic reaction to bee stings).    [provider]  glucose blood (ACCU-CHEK GUIDE) test strip Check blood sugar 3 times a day. 08/17/18   Katherine Roan, MD  Insulin Syringe-Needle U-100 31G X 15/64" 0.3 ML MISC Use to inject insulin two times each day 01/18/19   Axel Filler, MD  ipratropium-albuterol (DUONEB) 0.5-2.5 (3) MG/3ML SOLN Take 3 mLs by nebulization every 4 (four) hours as needed. 10/12/18   Dorrell, Andree Elk, MD  lamoTRIgine (LAMICTAL) 150 MG tablet Take 150 mg by mouth 2 (two) times daily. 08/06/17   [provider]  lidocaine (LIDODERM) 5 % Place 1 patch onto the skin every 12 (twelve) hours. Remove & Discard patch within 12 hours or as directed by MD 02/07/19 02/07/20  Modena Nunnery D, DO  lithium carbonate (ESKALITH) 450 MG CR tablet Take 450-675 mg by mouth See admin instructions. 675 mg by mouth in the morning and 450 mg at bedtime    [provider]  metFORMIN (GLUCOPHAGE) 1000 MG tablet TAKE 1 TABLET BY MOUTH TWICE DAILY WITH MEALS Patient taking differently: Take 1,000 mg by mouth 2 (two) times daily with a meal.  01/04/19   Katherine Roan, MD  multivitamin (ONE-A-DAY MEN'S) TABS tablet Take 1 tablet by mouth daily.    [provider]  NOVOLOG MIX 70/30 (70-30) 100 UNIT/ML injection Inject 0.15 mLs (15 Units total) into the skin 2 (two) times daily with a meal. 10/19/18   Winfrey, Jenne Pane, MD  omeprazole (PRILOSEC) 40 MG capsule Take 1 capsule (40 mg total) by mouth daily. 11/22/18   Annia Belt, MD  pregabalin (LYRICA) 150 MG capsule TAKE 1 CAPSULE BY  MOUTH TWICE DAILY Patient taking differently: Take 150 mg by mouth 2 (two) times daily.  08/31/18   Katherine Roan, MD  QUEtiapine (SEROQUEL) 50 MG tablet Take 225 mg by mouth at bedtime.    [provider]  TRELEGY ELLIPTA 100-62.5-25 MCG/INH AEPB INHALE 1 PUFF INTO LUNGS DAILY Patient taking differently: Take 1 puff by mouth daily.  01/09/19   Katherine Roan, MD  VENTOLIN HFA 108 [phone] Base) MCG/ACT inhaler Inhale 2 puffs into the lungs every 6 (six) hours as needed for wheezing or shortness of breath. 03/03/18   Katherine Roan, MD    Family History Family  History  Adopted: Yes    Social History Social History   Tobacco Use  . Smoking status: Former Smoker    Packs/day: 0.30    Years: 45.00    Pack years: 13.50    Types: Cigarettes    Quit date: 07/10/2017    Years since quitting: 1.6  . Smokeless tobacco: Current User    Types: Snuff  . Tobacco comment: Quit 2 weeks ago.  Substance Use Topics  . Alcohol use: Yes    Comment: Beer twice a week.  . Drug use: No     Allergies   Yellow jacket venom [bee venom] and 5-alpha reductase inhibitors   Review of Systems Review of Systems  Constitutional: Negative for chills and fever.  HENT: Negative for ear pain and sore throat.   Eyes: Negative for pain and visual disturbance.  Respiratory: Negative for cough and shortness of breath.   Cardiovascular: Negative for chest pain and palpitations.  Gastrointestinal: Positive for abdominal pain. Negative for abdominal distention, anal bleeding, anorexia, constipation, nausea and vomiting.  Genitourinary: Negative for dysuria and hematuria.  Musculoskeletal: Positive for back pain. Negative for arthralgias.  Skin: Positive for rash. Negative for color change.  Neurological: Negative for seizures and syncope.  All other systems reviewed and are negative.    Physical Exam Updated Vital Signs  ED Triage Vitals [02/23/19 1254]  Enc Vitals Group     BP (!) 158/98      Pulse Rate 99     Resp 20     Temp 98.5 F (36.9 C)     Temp Source Oral     SpO2 96 %     Weight      Height      Head Circumference      Peak Flow      Pain Score      Pain Loc      Pain Edu?      Excl. in [place]?     Physical Exam Vitals signs and nursing note reviewed.  Constitutional:      Appearance: He is well-developed.  HENT:     Head: Normocephalic and atraumatic.     Mouth/Throat:     Mouth: Mucous membranes are moist.  Eyes:     Extraocular Movements: Extraocular movements intact.     Conjunctiva/sclera: Conjunctivae normal.  Neck:     Musculoskeletal: Neck supple.  Cardiovascular:     Rate and Rhythm: Normal rate and regular rhythm.     Heart sounds: Normal heart sounds. No murmur.  Pulmonary:     Effort: Pulmonary effort is normal. No respiratory distress.     Breath sounds: Normal breath sounds.  Abdominal:     Palpations: Abdomen is soft.     Tenderness: There is abdominal tenderness in the right upper quadrant and right lower quadrant. There is right CVA tenderness. There is no left CVA tenderness, guarding or rebound. Negative signs include Murphy's sign and Rovsing's sign.  Skin:    General: Skin is warm and dry.     Capillary Refill: Capillary refill takes less than 2 seconds.     Findings: Rash present.     Comments: TTP to midback, low back  Neurological:     General: No focal deficit present.     Mental Status: He is alert.  Psychiatric:        Mood and Affect: Mood normal.      ED Treatments / Results  Labs (all labs ordered are listed, but  only abnormal results are displayed) Labs Reviewed  CBC WITH DIFFERENTIAL/PLATELET - Abnormal; Notable for the following components:      Result Value   Lymphs Abs 4.1 (*)    All other components within normal limits  COMPREHENSIVE METABOLIC PANEL - Abnormal; Notable for the following components:   Glucose, Bld 100 (*)    All other components within normal limits  LIPASE, BLOOD  URINALYSIS,  ROUTINE W REFLEX MICROSCOPIC    EKG None  Radiology No results found.  Procedures Procedures (including critical care time)  Medications Ordered in ED Medications  cyclobenzaprine (FLEXERIL) tablet 10 mg (10 mg Oral Given 02/23/19 1327)  fentaNYL (SUBLIMAZE) injection 50 mcg (50 mcg Intravenous Given 02/23/19 1327)     Initial Impression / Assessment and Plan / ED Course  I have reviewed the triage vital signs and the nursing notes.  Pertinent labs & imaging results that were available during my care of the patient were reviewed by me and considered in my medical decision making (see chart for details).     Jesus Martin is a 58 year old male with a history of bipolar disorder, COPD who presents to the ED with right-sided abdominal pain, back pain.  Patient with normal vitals.  No fever.  Patient recent L1 fracture.  Was seen by spine doctor yesterday and given clearance to not use his brace.  States that x-ray yesterday showed about a 30% compression now but overall stable.  Has had a rash that he was told was shingles that was diagnosed about a week ago in the same area where he has pain.  Patient however concern that he may have a kidney stone or other process or new fracture going on.  He denies any urinary symptoms.  Has tenderness along the rash site.  Suspect that pain is likely referred from fracture/shingles however will image abdomen, pelvis with CT scan.  Will get a lumbar CT scan based off of that image as well.  Will check basic lab work.  Patient given Flexeril and IV fentanyl for pain.  Suspect musculoskeletal type pain but will rule out kidney stone, other intra-abdominal process.  Lab work shows no significant anemia, electrolyte abnormality, kidney injury.  Gallbladder and liver enzymes within normal limits.  Lipase normal.  Doubt pancreatitis.  Patient awaiting CT scan of his lumbar spine, abdomen and pelvis at time of handoff to oncoming ED staff.  Please see their  note for further results, evaluation, disposition of the patient.  This chart was dictated using voice recognition software.  Despite best efforts to proofread,  errors can occur which can change the documentation meaning.    Final Clinical Impressions(s) / ED Diagnoses   Final diagnoses:  Pain  Back pain, unspecified back location, unspecified back pain laterality, unspecified chronicity    ED Discharge Orders    None       Lennice Sites, DO 02/23/19 Grover, [place], DO 02/23/19 1428

## 2019-02-23 NOTE — ED Notes (Signed)
Got patient undress on the monitor patient is resting with call bell in reach 

## 2019-02-23 NOTE — Telephone Encounter (Addendum)
Wife called in states patient has back fx and shingles. Went to neurosurgeon yesterday and when they got home he began with swelling from the right side of the naval around side to spine. Today the swelling is "like a balloon" and patient reports it is now putting pressure on spine and increasing pain. Since this is involving spine advised wife to call 911 to be transported via EMS to ED vs trying to get him in the car. Wife states she will. Hubbard Hartshorn, RN, BSN

## 2019-02-23 NOTE — ED Triage Notes (Signed)
Per [facility]: Pt has an L1 fracture from June 21st. Pt has possible shingles to the right side of his abdomen that wraps around to the right flank. There is some swelling noted to where the rash is. Pt reports that this started yesterday. Pt called his PCP, PCP advised the pt to call 911 and present to ED. Pt ambulatory on scene and is appropriate in triage.

## 2019-02-27 ENCOUNTER — Other Ambulatory Visit: Payer: Self-pay | Admitting: Internal Medicine

## 2019-02-27 ENCOUNTER — Telehealth: Payer: Self-pay

## 2019-02-27 DIAGNOSIS — S32010D Wedge compression fracture of first lumbar vertebra, subsequent encounter for fracture with routine healing: Secondary | ICD-10-CM

## 2019-02-27 MED ORDER — OXYCODONE-ACETAMINOPHEN 10-325 MG PO TABS: 1.0000 | ORAL_TABLET | Freq: Three times a day (TID) | ORAL | 0 refills | Status: DC | PRN

## 2019-02-27 NOTE — Telephone Encounter (Signed)
I will give him another course

## 2019-02-27 NOTE — Telephone Encounter (Signed)
Requesting percocet to be filled @  [place], [place] - [address] [phone] (Phone) [phone] (Fax)

## 2019-03-03 ENCOUNTER — Other Ambulatory Visit: Payer: Self-pay

## 2019-03-03 DIAGNOSIS — S32010D Wedge compression fracture of first lumbar vertebra, subsequent encounter for fracture with routine healing: Secondary | ICD-10-CM

## 2019-03-03 NOTE — Telephone Encounter (Signed)
Called pt back, his sig other states that he is afraid of running out this weekend, he is not taking it every 8 hrs, he is taking it every 4 hrs because "he needs it more often". She would like it filled this afternoon. She is cautioned that pt should never take medication other than how it is prescribed. She states he is having a great deal of pain

## 2019-03-03 NOTE — Telephone Encounter (Signed)
oxyCODONE-acetaminophen (PERCOCET) 10-325 MG tablet   REFILL REQUEST @  [facility], [place] [address] [phone] (Phone) [phone] (Fax)

## 2019-03-04 MED ORDER — OXYCODONE-ACETAMINOPHEN 10-325 MG PO TABS: ORAL_TABLET | ORAL | 0 refills | Status: AC

## 2019-03-04 NOTE — Telephone Encounter (Signed)
Will continue tapering pt down

## 2019-03-09 ENCOUNTER — Other Ambulatory Visit: Payer: Self-pay | Admitting: *Deleted

## 2019-03-09 DIAGNOSIS — S32010D Wedge compression fracture of first lumbar vertebra, subsequent encounter for fracture with routine healing: Secondary | ICD-10-CM

## 2019-03-09 NOTE — Telephone Encounter (Signed)
Received faxed request for the following medication: methocarbamol (ROBAXIN) 500 MG tablets Take one tablet by mouth four (4) times daily.  Medication no longer on medication list-will send to pcp for review.  Please advise.Despina Hidden Cassady7/30/202011:37 AM

## 2019-03-09 NOTE — Telephone Encounter (Signed)
Muscle relaxants historically do not work well for this condition.  Patient can try over the counter Ibuprofen as an adjunct to his current therapy he can also consider talking to his surgeon about kyphoplasty if this continues to be an issue.

## 2019-03-10 NOTE — Telephone Encounter (Signed)
Pt wife called pt needs refill on oxyCODONE-acetaminophen (PERCOCET) 10-325 MG tablet [place], [place] [address]

## 2019-03-10 NOTE — Telephone Encounter (Signed)
Spoke with pt's wife-they request another refill for percocet.  Info regarding muscle relaxants given to patient.  [facility] now closed, but will send request to MD for consideration.Despina Hidden Cassady7/31/202012:16 PM

## 2019-03-14 ENCOUNTER — Other Ambulatory Visit: Payer: Self-pay | Admitting: Internal Medicine

## 2019-03-14 ENCOUNTER — Ambulatory Visit: Payer: Medicare HMO | Admitting: Dietician

## 2019-03-14 ENCOUNTER — Other Ambulatory Visit: Payer: Self-pay

## 2019-03-14 ENCOUNTER — Telehealth: Payer: Self-pay | Admitting: Dietician

## 2019-03-14 ENCOUNTER — Telehealth: Payer: Self-pay | Admitting: *Deleted

## 2019-03-14 DIAGNOSIS — B029 Zoster without complications: Secondary | ICD-10-CM

## 2019-03-14 DIAGNOSIS — J449 Chronic obstructive pulmonary disease, unspecified: Secondary | ICD-10-CM | POA: Diagnosis not present

## 2019-03-14 DIAGNOSIS — G8929 Other chronic pain: Secondary | ICD-10-CM

## 2019-03-14 MED ORDER — CAPSAICIN 0.025 % EX PADS: 1.0000 "application " | MEDICATED_PAD | Freq: Three times a day (TID) | CUTANEOUS | 0 refills | Status: DC | PRN

## 2019-03-14 MED ORDER — TRAMADOL HCL 50 MG PO TABS: 50.0000 mg | ORAL_TABLET | Freq: Two times a day (BID) | ORAL | 0 refills | Status: DC | PRN

## 2019-03-14 MED ORDER — CAPSAICIN 0.075 % EX CREA: 1.0000 "application " | TOPICAL_CREAM | Freq: Three times a day (TID) | CUTANEOUS | 0 refills | Status: DC | PRN

## 2019-03-14 NOTE — Telephone Encounter (Signed)
Spoke to patient's wife.  We will try capsaicin cream in addition to current therapies.  Some relief with topical lidocaine preparations.  Already on Lyrica.  Not a candidate for TCAs.  He is starting back on his chronic tramadol after finishing oxycodone course. He takes celebrex prescribed by his psychiatrist for one month. If he cannot get relief with all this would recommend neurology referral for possible botox injection or other intervention.

## 2019-03-14 NOTE — Telephone Encounter (Signed)
Opened new encounter 

## 2019-03-14 NOTE — Telephone Encounter (Signed)
Called Jesus Martin for his scheduled appointment for diabetes self management education and support follow up. Left a message for return call or call the front office to reschedule.

## 2019-03-14 NOTE — Telephone Encounter (Signed)
Pt's wife requesting to speak with a nurse about meds. Please call back.

## 2019-03-14 NOTE — Telephone Encounter (Signed)
I will give her a call.

## 2019-03-14 NOTE — Telephone Encounter (Signed)
Dr Shan Levans could you please call pt's spouse and speak to her about pt's pain and otc meds and script meds. Pain from shingles is extreme.

## 2019-03-21 ENCOUNTER — Telehealth: Payer: Self-pay | Admitting: *Deleted

## 2019-03-21 NOTE — Telephone Encounter (Signed)
Requesting a refill on Methocarbamol 500 mg - not on current med list. Next appt scheduled 9/28 with PCP. Thanks

## 2019-03-22 NOTE — Telephone Encounter (Signed)
[place] informed.

## 2019-03-22 NOTE — Telephone Encounter (Signed)
We have already discussed that this medication would not be helpful for him.  Had extensive conversation with him and his wife about this.

## 2019-04-07 ENCOUNTER — Other Ambulatory Visit: Payer: Self-pay | Admitting: Internal Medicine

## 2019-04-07 DIAGNOSIS — G8929 Other chronic pain: Secondary | ICD-10-CM

## 2019-04-10 NOTE — Telephone Encounter (Signed)
Next appt scheduled 9/28 with PCP.

## 2019-04-14 DIAGNOSIS — J449 Chronic obstructive pulmonary disease, unspecified: Secondary | ICD-10-CM | POA: Diagnosis not present

## 2019-04-14 DIAGNOSIS — R030 Elevated blood-pressure reading, without diagnosis of hypertension: Secondary | ICD-10-CM | POA: Diagnosis not present

## 2019-04-14 DIAGNOSIS — S32010D Wedge compression fracture of first lumbar vertebra, subsequent encounter for fracture with routine healing: Secondary | ICD-10-CM | POA: Diagnosis not present

## 2019-04-14 DIAGNOSIS — Z6832 Body mass index (BMI) 32.0-32.9, adult: Secondary | ICD-10-CM | POA: Diagnosis not present

## 2019-04-20 ENCOUNTER — Other Ambulatory Visit: Payer: Self-pay | Admitting: Internal Medicine

## 2019-04-20 DIAGNOSIS — I10 Essential (primary) hypertension: Secondary | ICD-10-CM

## 2019-04-20 NOTE — Telephone Encounter (Signed)
refilled 

## 2019-05-05 ENCOUNTER — Other Ambulatory Visit: Payer: Self-pay | Admitting: Internal Medicine

## 2019-05-05 DIAGNOSIS — G8929 Other chronic pain: Secondary | ICD-10-CM

## 2019-05-05 DIAGNOSIS — E7800 Pure hypercholesterolemia, unspecified: Secondary | ICD-10-CM

## 2019-05-08 ENCOUNTER — Other Ambulatory Visit: Payer: Self-pay

## 2019-05-08 ENCOUNTER — Ambulatory Visit (INDEPENDENT_AMBULATORY_CARE_PROVIDER_SITE_OTHER): Payer: Medicare HMO | Admitting: Internal Medicine

## 2019-05-08 ENCOUNTER — Encounter: Payer: Self-pay | Admitting: Internal Medicine

## 2019-05-08 VITALS — BP 138/93 | HR 102 | Temp 98.0°F | Ht 73.0 in | Wt 242.7 lb

## 2019-05-08 DIAGNOSIS — Z23 Encounter for immunization: Secondary | ICD-10-CM | POA: Diagnosis not present

## 2019-05-08 DIAGNOSIS — G8929 Other chronic pain: Secondary | ICD-10-CM

## 2019-05-08 DIAGNOSIS — Z79899 Other long term (current) drug therapy: Secondary | ICD-10-CM

## 2019-05-08 DIAGNOSIS — E785 Hyperlipidemia, unspecified: Secondary | ICD-10-CM | POA: Diagnosis not present

## 2019-05-08 DIAGNOSIS — E119 Type 2 diabetes mellitus without complications: Secondary | ICD-10-CM

## 2019-05-08 DIAGNOSIS — M4856XD Collapsed vertebra, not elsewhere classified, lumbar region, subsequent encounter for fracture with routine healing: Secondary | ICD-10-CM | POA: Diagnosis not present

## 2019-05-08 DIAGNOSIS — S32010G Wedge compression fracture of first lumbar vertebra, subsequent encounter for fracture with delayed healing: Secondary | ICD-10-CM

## 2019-05-08 DIAGNOSIS — F17221 Nicotine dependence, chewing tobacco, in remission: Secondary | ICD-10-CM | POA: Diagnosis not present

## 2019-05-08 DIAGNOSIS — Z7984 Long term (current) use of oral hypoglycemic drugs: Secondary | ICD-10-CM | POA: Diagnosis not present

## 2019-05-08 DIAGNOSIS — E7800 Pure hypercholesterolemia, unspecified: Secondary | ICD-10-CM

## 2019-05-08 DIAGNOSIS — S32010D Wedge compression fracture of first lumbar vertebra, subsequent encounter for fracture with routine healing: Secondary | ICD-10-CM | POA: Diagnosis not present

## 2019-05-08 LAB — GLUCOSE, CAPILLARY: Glucose-Capillary: 97 mg/dL (ref 70–99)

## 2019-05-08 MED ORDER — NOVOLOG MIX 70/30 (70-30) 100 UNIT/ML ~~LOC~~ SUSP: 15.0000 [IU] | Freq: Two times a day (BID) | SUBCUTANEOUS | 1 refills | Status: DC

## 2019-05-08 MED ORDER — TRAMADOL HCL 50 MG PO TABS: 50.0000 mg | ORAL_TABLET | Freq: Two times a day (BID) | ORAL | 0 refills | Status: DC | PRN

## 2019-05-08 MED ORDER — ALENDRONATE SODIUM 70 MG PO TABS: 70.0000 mg | ORAL_TABLET | ORAL | 11 refills | Status: AC

## 2019-05-08 MED ORDER — ATORVASTATIN CALCIUM 40 MG PO TABS: 40.0000 mg | ORAL_TABLET | Freq: Every day | ORAL | 3 refills | Status: DC

## 2019-05-08 NOTE — Patient Instructions (Signed)
Jesus Martin, we will check your hgb A1c for your diabetes.  I have started you on a new medication for bone strength called fosamax.  Additionally I will check your PTH and calcium today and let you know the results.

## 2019-05-08 NOTE — Progress Notes (Signed)
CC: T2DM, Compression fracture, chronic pain, HLD  HPI:  [name] is a 58 y.o. male with PMH below.  Today we will address T2DM, Compression fracture, HLD, chronic pain  Please see A&P for status of the patient's chronic medical conditions  Past Medical History:  Diagnosis Date  . Bipolar I disorder (HCC) 1991   Dr. Toy Care  . Borderline hyperlipidemia    history of  . Childhood asthma   . COPD (chronic obstructive pulmonary disease) ([place])   . Diabetes mellitus, new onset ([place]) 12/22/2017  . Elevated glucose   . Fibromyalgia   . GERD (gastroesophageal reflux disease)   . HTN (hypertension)   . Low vitamin B12 level 06/07/2018  . Lower back pain   . Lumbar vertebral fracture (HCC) 01/29/2019   L1  . Paranoia ([place])   . Tobacco abuse   . Tobacco abuse   . Vitamin D deficiency    Review of Systems:  ROS: Pulmonary: pt denies increased work of breathing, shortness of breath,  Cardiac: pt denies palpitations, chest pain,  Abdominal: pt denies abdominal pain, nausea, vomiting, or diarrhea   Physical Exam:  Vitals:   05/08/19 1324 05/08/19 1326  BP: (!) 131/107 (!) 138/93  Pulse: (!) 103 (!) 102  Temp: 98 F (36.7 C)   TempSrc: Oral   SpO2: 97%   Weight: 242 lb 11.2 oz (110.1 kg)   Height: 6' 1"  (1.854 m)    Cardiac: JVD flat, normal rate and rhythm, clear s1 and s2, no murmurs, rubs or gallops, no LE edema Pulmonary: CTAB, not in distress Abdominal: non distended abdomen, soft and nontender Psych: Alert, conversant, in good spirits   Social History   Socioeconomic History  . Marital status: Married    Spouse name: Not on file  . Number of children: Not on file  . Years of education: Not on file  . Highest education level: Not on file  Occupational History  . Not on file  Social Needs  . Financial resource strain: Not on file  . Food insecurity    Worry: Not on file    Inability: Not on file  . Transportation needs    Medical: Not on file   Non-medical: Not on file  Tobacco Use  . Smoking status: Former Smoker    Packs/day: 0.30    Years: 45.00    Pack years: 13.50    Types: Cigarettes    Quit date: 07/10/2017    Years since quitting: 1.8  . Smokeless tobacco: Current User    Types: Snuff  . Tobacco comment: Quit 2 weeks ago.  Substance and Sexual Activity  . Alcohol use: Yes    Comment: Beer twice a week.  . Drug use: No  . Sexual activity: Not on file  Lifestyle  . Physical activity    Days per week: Patient refused    Minutes per session: Patient refused  . Stress: Not on file  Relationships  . Social Herbalist on phone: Patient refused    Gets together: Patient refused    Attends religious service: Patient refused    Active member of club or organization: Patient refused    Attends meetings of clubs or organizations: Patient refused    Relationship status: Patient refused  . Intimate partner violence    Fear of current or ex partner: Patient refused    Emotionally abused: Patient refused    Physically abused: Patient refused    Forced sexual activity:  Patient refused  Other Topics Concern  . Not on file  Social History Narrative   Lives with wife Jesus Martin (married 55 years) and son. Has 4 dogs and fish. Not employed. Woodworking for fun. HS graduate.       Quit smoking in 2008. 26 pack years. No recreational drug use. Drinks 1 glass of wine per month. Does not exercise.     Family History  Adopted: Yes    Assessment & Plan:   See Encounters Tab for problem based charting.  Patient discussed with Dr. Lynnae January

## 2019-05-09 LAB — PTH, INTACT AND CALCIUM
Calcium: 10.7 mg/dL — ABNORMAL HIGH (ref 8.7–10.2)
PTH: 30 pg/mL (ref 15–65)

## 2019-05-09 LAB — HEMOGLOBIN A1C
Est. average glucose Bld gHb Est-mCnc: 111 mg/dL
Hgb A1c MFr Bld: 5.5 % (ref 4.8–5.6)

## 2019-05-09 NOTE — Progress Notes (Signed)
Internal Medicine Clinic Attending  Case discussed with Dr. Winfrey  at the time of the visit.  We reviewed the resident's history and exam and pertinent patient test results.  I agree with the assessment, diagnosis, and plan of care documented in the resident's note.  

## 2019-05-09 NOTE — Assessment & Plan Note (Signed)
Continues to have pain, scheduled for kyphoplasty next week.  Considered a fragility fracture and will treat as such.  Vit D and calcium within normal limits   -will check parathyroid hormone levels today -will start pt on fosamax 23m weekly

## 2019-05-09 NOTE — Assessment & Plan Note (Signed)
Pt requires refills on medications with associated diagnosis above.  Reviewed disease process and find this medication to be necessary, will not change dose or alter current therapy. 

## 2019-05-09 NOTE — Assessment & Plan Note (Addendum)
Well controlled on metformin full dose and 70/30 at 15 units BID.  He is due for an A1C today.  A1C is 5.5 he manages and titrates his own insulin at times.  He is very methodical and doesn't have any lows.  He is resistant to loosening his a1c target.  We put him on cgm which verified he's not having lows and doesn't seem to be in much danger of hypoglycemia given his methodical monitoring.  -continue current management

## 2019-05-10 DIAGNOSIS — Z1159 Encounter for screening for other viral diseases: Secondary | ICD-10-CM | POA: Diagnosis not present

## 2019-05-14 ENCOUNTER — Other Ambulatory Visit: Payer: Self-pay | Admitting: Internal Medicine

## 2019-05-14 DIAGNOSIS — E119 Type 2 diabetes mellitus without complications: Secondary | ICD-10-CM

## 2019-05-14 DIAGNOSIS — J449 Chronic obstructive pulmonary disease, unspecified: Secondary | ICD-10-CM | POA: Diagnosis not present

## 2019-05-15 ENCOUNTER — Encounter: Payer: Self-pay | Admitting: Internal Medicine

## 2019-05-15 ENCOUNTER — Telehealth: Payer: Self-pay | Admitting: Internal Medicine

## 2019-05-15 NOTE — Telephone Encounter (Signed)
Spoke with pt about calcium result this is the second elevated reading.  In the setting of compression fracture will work this up further.

## 2019-05-16 ENCOUNTER — Other Ambulatory Visit (INDEPENDENT_AMBULATORY_CARE_PROVIDER_SITE_OTHER): Payer: Medicare HMO

## 2019-05-16 ENCOUNTER — Other Ambulatory Visit: Payer: Self-pay | Admitting: *Deleted

## 2019-05-16 DIAGNOSIS — S32010G Wedge compression fracture of first lumbar vertebra, subsequent encounter for fracture with delayed healing: Secondary | ICD-10-CM | POA: Diagnosis not present

## 2019-05-16 DIAGNOSIS — S32010D Wedge compression fracture of first lumbar vertebra, subsequent encounter for fracture with routine healing: Secondary | ICD-10-CM | POA: Diagnosis not present

## 2019-05-16 DIAGNOSIS — E119 Type 2 diabetes mellitus without complications: Secondary | ICD-10-CM

## 2019-05-17 NOTE — Telephone Encounter (Signed)
Jesus Martin with [facility] requesting a new Rx for pregabalin (LYRICA) 150 MG capsule, states the old Rx is expired. Please call back.

## 2019-05-18 MED ORDER — PREGABALIN 150 MG PO CAPS: 150.0000 mg | ORAL_CAPSULE | Freq: Two times a day (BID) | ORAL | 1 refills | Status: DC

## 2019-05-18 NOTE — Telephone Encounter (Signed)
Called the pharmacy will refill new prescription, automatically expires in 6 months unlike gabapentin.

## 2019-05-21 LAB — MULTIPLE MYELOMA PANEL, SERUM
Albumin SerPl Elph-Mcnc: 4.2 g/dL (ref 2.9–4.4)
Albumin/Glob SerPl: 1.4 (ref 0.7–1.7)
Alpha 1: 0.2 g/dL (ref 0.0–0.4)
Alpha2 Glob SerPl Elph-Mcnc: 1.1 g/dL — ABNORMAL HIGH (ref 0.4–1.0)
B-Globulin SerPl Elph-Mcnc: 1 g/dL (ref 0.7–1.3)
Gamma Glob SerPl Elph-Mcnc: 0.9 g/dL (ref 0.4–1.8)
Globulin, Total: 3.2 g/dL (ref 2.2–3.9)
IgA/Immunoglobulin A, Serum: 166 mg/dL (ref 90–386)
IgG (Immunoglobin G), Serum: 854 mg/dL (ref 603–1613)
IgM (Immunoglobulin M), Srm: 129 mg/dL (ref 20–172)
Total Protein: 7.4 g/dL (ref 6.0–8.5)

## 2019-05-21 LAB — KAPPA/LAMBDA LIGHT CHAINS
Ig Kappa Free Light Chain: 25.3 mg/L — ABNORMAL HIGH (ref 3.3–19.4)
Ig Lambda Free Light Chain: 23.2 mg/L (ref 5.7–26.3)
Kappa/Lambda FluidC Ratio: 1.09 (ref 0.26–1.65)

## 2019-05-21 LAB — VITAMIN D 1,25 DIHYDROXY
Vitamin D 1, 25 (OH)2 Total: 27 pg/mL
Vitamin D2 1, 25 (OH)2: <10 pg/mL
Vitamin D3 1, 25 (OH)2: 27 pg/mL

## 2019-05-26 LAB — PTH-RELATED PEPTIDE: PTH-related peptide: <2 pmol/L

## 2019-05-31 DIAGNOSIS — S32010D Wedge compression fracture of first lumbar vertebra, subsequent encounter for fracture with routine healing: Secondary | ICD-10-CM | POA: Diagnosis not present

## 2019-05-31 DIAGNOSIS — I10 Essential (primary) hypertension: Secondary | ICD-10-CM | POA: Diagnosis not present

## 2019-05-31 DIAGNOSIS — Z6832 Body mass index (BMI) 32.0-32.9, adult: Secondary | ICD-10-CM | POA: Diagnosis not present

## 2019-06-02 ENCOUNTER — Telehealth: Payer: Self-pay | Admitting: Internal Medicine

## 2019-06-02 ENCOUNTER — Encounter: Payer: Self-pay | Admitting: Internal Medicine

## 2019-06-02 NOTE — Telephone Encounter (Signed)
Spoke with Mr. Gentles about hypercalcemia workup.  Hypercalcemia mild on two results so far.  Ionized calcium normal.  Workup is completely negative and PTH appropriate.  This mild elevation may be related to his lithium use.  Periodic monitoring, age appropriate cancer screenings, bisphosphonate therapy for his fragility fracture and reassurance.  Sending pt a copy of results in the mail.

## 2019-06-03 ENCOUNTER — Other Ambulatory Visit: Payer: Self-pay | Admitting: Internal Medicine

## 2019-06-03 DIAGNOSIS — G8929 Other chronic pain: Secondary | ICD-10-CM

## 2019-06-06 NOTE — Telephone Encounter (Signed)
Medication Refill F/u  traMADol (ULTRAM) 50 MG tablet  [facility] [place], [place] - [address]  [place], [place] - [address]

## 2019-06-07 NOTE — Telephone Encounter (Signed)
Next appt scheduled 11/30 with PCP.

## 2019-06-14 DIAGNOSIS — J449 Chronic obstructive pulmonary disease, unspecified: Secondary | ICD-10-CM | POA: Diagnosis not present

## 2019-07-03 ENCOUNTER — Other Ambulatory Visit: Payer: Self-pay | Admitting: *Deleted

## 2019-07-03 DIAGNOSIS — G8929 Other chronic pain: Secondary | ICD-10-CM

## 2019-07-03 MED ORDER — TRAMADOL HCL 50 MG PO TABS: 50.0000 mg | ORAL_TABLET | Freq: Two times a day (BID) | ORAL | 0 refills | Status: DC | PRN

## 2019-07-10 ENCOUNTER — Encounter: Payer: Self-pay | Admitting: Internal Medicine

## 2019-07-10 ENCOUNTER — Encounter: Payer: Medicare HMO | Admitting: Internal Medicine

## 2019-07-10 NOTE — Progress Notes (Deleted)
CC: T2DM  HPI:  Mr.Mattthew W Martin is a 58 y.o. male with PMH below.  Today we will address T2DM  Currently on Insulin 70/30 15u BID and metformin full dose.  Hgb A1C has been consistently good since 09/2017 when it was 11.6.    Please see A&P for status of the patient's chronic medical conditions  Past Medical History:  Diagnosis Date  . Bipolar I disorder (HCC) 1991   Dr. Toy Care  . Borderline hyperlipidemia    history of  . Childhood asthma   . COPD (chronic obstructive pulmonary disease) ([place])   . Diabetes mellitus, new onset ([place]) 12/22/2017  . Elevated glucose   . Fibromyalgia   . GERD (gastroesophageal reflux disease)   . HTN (hypertension)   . Low vitamin B12 level 06/07/2018  . Lower back pain   . Lumbar vertebral fracture (HCC) 01/29/2019   L1  . Paranoia ([place])   . Tobacco abuse   . Tobacco abuse   . Vitamin D deficiency    Review of Systems:  ***  Physical Exam:  There were no vitals filed for this visit. ***  Social History   Socioeconomic History  . Marital status: Married    Spouse name: Not on file  . Number of children: Not on file  . Years of education: Not on file  . Highest education level: Not on file  Occupational History  . Not on file  Social Needs  . Financial resource strain: Not on file  . Food insecurity    Worry: Not on file    Inability: Not on file  . Transportation needs    Medical: Not on file    Non-medical: Not on file  Tobacco Use  . Smoking status: Former Smoker    Packs/day: 0.30    Years: 45.00    Pack years: 13.50    Types: Cigarettes    Quit date: 07/10/2017    Years since quitting: 2.0  . Smokeless tobacco: Current User    Types: Snuff  . Tobacco comment: Quit 2 weeks ago.  Substance and Sexual Activity  . Alcohol use: Yes    Comment: Beer twice a week.  . Drug use: No  . Sexual activity: Not on file  Lifestyle  . Physical activity    Days per week: Patient refused    Minutes per session: Patient  refused  . Stress: Not on file  Relationships  . Social Herbalist on phone: Patient refused    Gets together: Patient refused    Attends religious service: Patient refused    Active member of club or organization: Patient refused    Attends meetings of clubs or organizations: Patient refused    Relationship status: Patient refused  . Intimate partner violence    Fear of current or ex partner: Patient refused    Emotionally abused: Patient refused    Physically abused: Patient refused    Forced sexual activity: Patient refused  Other Topics Concern  . Not on file  Social History Narrative   Lives with wife Jesus Martin (married 85 years) and son. Has 4 dogs and fish. Not employed. Woodworking for fun. HS graduate.       Quit smoking in 2008. 26 pack years. No recreational drug use. Drinks 1 glass of wine per month. Does not exercise.    *** Family History  Adopted: Yes    Assessment & Plan:   See Encounters Tab for problem based charting.  Patient {GC/GE:3044014::"discussed with","seen with"} Dr. {NAMES:[id number]::"Butcher","Granfortuna","E. Hoffman","Klima","Mullen","Narendra","Raines","Vincent"}

## 2019-07-14 DIAGNOSIS — J449 Chronic obstructive pulmonary disease, unspecified: Secondary | ICD-10-CM | POA: Diagnosis not present

## 2019-07-31 ENCOUNTER — Other Ambulatory Visit: Payer: Self-pay | Admitting: Internal Medicine

## 2019-07-31 DIAGNOSIS — G8929 Other chronic pain: Secondary | ICD-10-CM

## 2019-08-01 ENCOUNTER — Other Ambulatory Visit: Payer: Self-pay | Admitting: *Deleted

## 2019-08-01 MED ORDER — OMEPRAZOLE 40 MG PO CPDR: 40.0000 mg | DELAYED_RELEASE_CAPSULE | Freq: Every day | ORAL | 0 refills | Status: DC

## 2019-08-02 NOTE — Telephone Encounter (Signed)
Spoke with the patient.  He is sch for [facility] on 08/07/2019 @ 10:45 am.

## 2019-08-07 ENCOUNTER — Other Ambulatory Visit: Payer: Self-pay

## 2019-08-07 ENCOUNTER — Encounter: Payer: Self-pay | Admitting: Internal Medicine

## 2019-08-07 ENCOUNTER — Ambulatory Visit (INDEPENDENT_AMBULATORY_CARE_PROVIDER_SITE_OTHER): Payer: Medicare HMO | Admitting: Internal Medicine

## 2019-08-07 VITALS — BP 138/94 | HR 104 | Temp 97.8°F | Ht 73.0 in | Wt 259.6 lb

## 2019-08-07 DIAGNOSIS — Z981 Arthrodesis status: Secondary | ICD-10-CM | POA: Diagnosis not present

## 2019-08-07 DIAGNOSIS — E119 Type 2 diabetes mellitus without complications: Secondary | ICD-10-CM | POA: Diagnosis not present

## 2019-08-07 DIAGNOSIS — S32010G Wedge compression fracture of first lumbar vertebra, subsequent encounter for fracture with delayed healing: Secondary | ICD-10-CM

## 2019-08-07 DIAGNOSIS — Z794 Long term (current) use of insulin: Secondary | ICD-10-CM | POA: Diagnosis not present

## 2019-08-07 DIAGNOSIS — M4850XD Collapsed vertebra, not elsewhere classified, site unspecified, subsequent encounter for fracture with routine healing: Secondary | ICD-10-CM

## 2019-08-07 DIAGNOSIS — F17221 Nicotine dependence, chewing tobacco, in remission: Secondary | ICD-10-CM | POA: Diagnosis not present

## 2019-08-07 NOTE — Progress Notes (Signed)
CC: compression fracture, T2DM follow up  HPI:  [name] is a 58 y.o. male with PMH below.  Today we will address compression fracture, T2DM follow up  Please see A&P for status of the patient's chronic medical conditions  Past Medical History:  Diagnosis Date  . Bipolar I disorder (HCC) 1991   Dr. Toy Care  . Borderline hyperlipidemia    history of  . Childhood asthma   . COPD (chronic obstructive pulmonary disease) ([place])   . Diabetes mellitus, new onset ([place]) 12/22/2017  . Elevated glucose   . Fibromyalgia   . GERD (gastroesophageal reflux disease)   . HTN (hypertension)   . Low vitamin B12 level 06/07/2018  . Lower back pain   . Lumbar vertebral fracture (HCC) 01/29/2019   L1  . Paranoia ([place])   . Tobacco abuse   . Tobacco abuse   . Vitamin D deficiency    Review of Systems:  ROS: Pulmonary: pt denies increased work of breathing, shortness of breath,  Cardiac: pt denies palpitations, chest pain,  Abdominal: pt denies abdominal pain, nausea, vomiting, or diarrhea   Physical Exam:  Vitals:   08/07/19 1053  BP: (!) 138/94  Pulse: (!) 104  Temp: 97.8 F (36.6 C)  TempSrc: Oral  SpO2: 96%  Weight: 259 lb 9.6 oz (117.8 kg)  Height: 6' 1"  (1.854 m)   Cardiac: normal rate and rhythm, clear s1 and s2, no murmurs, rubs or gallops, no LE edema Pulmonary: CTAB, not in distress Psych: Alert, conversant, in good spirits   Social History   Socioeconomic History  . Marital status: Married    Spouse name: Not on file  . Number of children: Not on file  . Years of education: Not on file  . Highest education level: Not on file  Occupational History  . Not on file  Tobacco Use  . Smoking status: Former Smoker    Packs/day: 0.30    Years: 45.00    Pack years: 13.50    Types: Cigarettes    Quit date: 07/10/2017    Years since quitting: 2.0  . Smokeless tobacco: Current User    Types: Snuff  . Tobacco comment: Quit 2 weeks ago.  Substance and Sexual  Activity  . Alcohol use: Yes    Comment: Beer twice a week.  . Drug use: No  . Sexual activity: Not on file  Other Topics Concern  . Not on file  Social History Narrative   Lives with wife Jesus Martin (married 28 years) and son. Has 4 dogs and fish. Not employed. Woodworking for fun. HS graduate.       Quit smoking in 2008. 26 pack years. No recreational drug use. Drinks 1 glass of wine per month. Does not exercise.    Social Determinants of Health   Financial Resource Strain:   . Difficulty of Paying Living Expenses: Not on file  Food Insecurity:   . Worried About Charity fundraiser in the Last Year: Not on file  . Ran Out of Food in the Last Year: Not on file  Transportation Needs:   . Lack of Transportation (Medical): Not on file  . Lack of Transportation (Non-Medical): Not on file  Physical Activity: Unknown  . Days of Exercise per Week: Patient refused  . Minutes of Exercise per Session: Patient refused  Stress:   . Feeling of Stress : Not on file  Social Connections: Unknown  . Frequency of Communication with Friends and Family: Patient  refused  . Frequency of Social Gatherings with Friends and Family: Patient refused  . Attends Religious Services: Patient refused  . Active Member of Clubs or Organizations: Patient refused  . Attends Archivist Meetings: Patient refused  . Marital Status: Patient refused  Intimate Partner Violence: Unknown  . Fear of Current or Ex-Partner: Patient refused  . Emotionally Abused: Patient refused  . Physically Abused: Patient refused  . Sexually Abused: Patient refused    Family History  Adopted: Yes    Assessment & Plan:   See Encounters Tab for problem based charting.  Patient discussed with Dr. Evette Doffing

## 2019-08-07 NOTE — Assessment & Plan Note (Signed)
He is being followed by Dr. Ellene Route.  He reports great progress since I have seen him last and his kyphoplasty procedure.  He is taking his alendronate every Wednesday, he takes it on an empty stomach with a full glass of water sitting upright.  He reports no side effects.    -encouraged to try and increase his activity level as able

## 2019-08-07 NOTE — Patient Instructions (Signed)
Jesus Martin keep up the good work, continue your current medications without any changes.  We will see you in about 6 months.

## 2019-08-07 NOTE — Assessment & Plan Note (Signed)
Well controlled on metformin full dose and 70/30 at 15 units BID.  Last A1C is 5.5 he manages and titrates his own insulin at times.  He is very methodical and doesn't have any lows.  He is resistant to loosening his a1c target.  We put him on cgm which verified he's not having lows and doesn't seem to be in much danger of hypoglycemia given his methodical monitoring.  I discussed lengthening his a1c checks given his good control.  He will let me know if there are any changes that may require some adjustment.  -continue current management

## 2019-08-07 NOTE — Progress Notes (Signed)
Internal Medicine Clinic Attending  Case discussed with Dr. Winfrey  at the time of the visit.  We reviewed the resident's history and exam and pertinent patient test results.  I agree with the assessment, diagnosis, and plan of care documented in the resident's note.  

## 2019-08-28 ENCOUNTER — Other Ambulatory Visit: Payer: Self-pay | Admitting: Internal Medicine

## 2019-08-28 DIAGNOSIS — G8929 Other chronic pain: Secondary | ICD-10-CM

## 2019-09-01 ENCOUNTER — Ambulatory Visit: Payer: Medicare HMO | Admitting: Podiatry

## 2019-09-22 ENCOUNTER — Other Ambulatory Visit: Payer: Self-pay | Admitting: Internal Medicine

## 2019-09-22 DIAGNOSIS — G8929 Other chronic pain: Secondary | ICD-10-CM

## 2019-10-10 ENCOUNTER — Ambulatory Visit: Payer: Medicare Other | Admitting: Podiatry

## 2019-10-11 ENCOUNTER — Other Ambulatory Visit: Payer: Self-pay | Admitting: Internal Medicine

## 2019-10-11 DIAGNOSIS — E119 Type 2 diabetes mellitus without complications: Secondary | ICD-10-CM

## 2019-10-15 ENCOUNTER — Other Ambulatory Visit: Payer: Self-pay | Admitting: Internal Medicine

## 2019-10-15 DIAGNOSIS — G8929 Other chronic pain: Secondary | ICD-10-CM

## 2019-10-19 ENCOUNTER — Ambulatory Visit (INDEPENDENT_AMBULATORY_CARE_PROVIDER_SITE_OTHER): Payer: Medicare Other

## 2019-10-19 ENCOUNTER — Other Ambulatory Visit: Payer: Self-pay

## 2019-10-19 ENCOUNTER — Ambulatory Visit: Payer: Medicare Other | Admitting: Podiatry

## 2019-10-19 DIAGNOSIS — M722 Plantar fascial fibromatosis: Secondary | ICD-10-CM | POA: Diagnosis not present

## 2019-10-19 DIAGNOSIS — E1149 Type 2 diabetes mellitus with other diabetic neurological complication: Secondary | ICD-10-CM

## 2019-10-19 MED ORDER — DICLOFENAC SODIUM 1 % EX GEL: 2.0000 g | Freq: Four times a day (QID) | CUTANEOUS | 2 refills | Status: DC

## 2019-10-19 NOTE — Patient Instructions (Signed)

## 2019-10-29 NOTE — Progress Notes (Signed)
Subjective: 59 year old male presents the office today for follow-up evaluation of bilateral heel pain, plantar fasciitis.  He states he has continued to have pain to his feet and the pain never dissipated.  Since I last saw him he had a fracture to his back and he has been pretty much bedbound.  He states he is trying to increase his walking and since then he is having difficulty because of the heel pain.  No recent injury to his lower extremities.  He is asking for orthotics.  Denies any systemic complaints such as fevers, chills, nausea, vomiting. No acute changes since last appointment, and no other complaints at this time.   Objective: AAO x3, NAD DP/PT pulses palpable bilaterally, CRT less than 3 seconds There is tenderness on the plantar medial tubercle of the calcaneus at the insertion of plantar fascia bilaterally.  Plantar pressure appears intact.  No pain to the Achilles tendon.  Negative Tinel's sign.  No edema, erythema.  No other areas of discomfort identified today. No open lesions or pre-ulcerative lesions.  No pain with calf compression, swelling, warmth, erythema  Assessment: Chronic plantar fasciitis, heel pain  Plan: -All treatment options discussed with the patient including all alternatives, risks, complications.  -X-rays obtained and reviewed.  No evidence of acute fracture or stress fracture identified today. -Previously was diagnosed with plantar fasciitis spring 2020 and since then had a injury to his back.  Now he states he is becoming more mobile he is having quite a bit of pain to his feet, to the heels.  He wants to proceed with orthotics.  He was measured for inserts today.  Given his healing fracture we will hold off on steroids currently.  Discussed stretching, icing daily.  Consider physical therapy but he has been cleared for his back prior to starting this.  He can do some gentle home stretching exercises. -Patient encouraged to call the office with any questions,  concerns, change in symptoms.   Trula Slade DPM

## 2019-11-02 ENCOUNTER — Other Ambulatory Visit: Payer: Self-pay | Admitting: Internal Medicine

## 2019-11-16 ENCOUNTER — Ambulatory Visit: Payer: Medicare Other | Admitting: Podiatry

## 2019-11-17 ENCOUNTER — Other Ambulatory Visit: Payer: Self-pay | Admitting: *Deleted

## 2019-11-17 DIAGNOSIS — E119 Type 2 diabetes mellitus without complications: Secondary | ICD-10-CM

## 2019-11-17 MED ORDER — HUMALOG MIX 75/25 (75-25) 100 UNIT/ML ~~LOC~~ SUSP: 15.0000 [IU] | Freq: Two times a day (BID) | SUBCUTANEOUS | 1 refills | Status: DC

## 2019-11-20 ENCOUNTER — Other Ambulatory Visit: Payer: Self-pay

## 2019-11-20 ENCOUNTER — Other Ambulatory Visit: Payer: Self-pay | Admitting: Internal Medicine

## 2019-11-20 ENCOUNTER — Encounter: Payer: Self-pay | Admitting: Podiatry

## 2019-11-20 ENCOUNTER — Ambulatory Visit: Payer: Medicare Other | Admitting: Orthotics

## 2019-11-20 ENCOUNTER — Ambulatory Visit: Payer: Medicare Other | Admitting: Podiatry

## 2019-11-20 VITALS — Temp 98.4°F

## 2019-11-20 DIAGNOSIS — M722 Plantar fascial fibromatosis: Secondary | ICD-10-CM

## 2019-11-20 DIAGNOSIS — E1149 Type 2 diabetes mellitus with other diabetic neurological complication: Secondary | ICD-10-CM | POA: Diagnosis not present

## 2019-11-20 DIAGNOSIS — G8929 Other chronic pain: Secondary | ICD-10-CM

## 2019-11-20 DIAGNOSIS — E119 Type 2 diabetes mellitus without complications: Secondary | ICD-10-CM

## 2019-11-20 DIAGNOSIS — G629 Polyneuropathy, unspecified: Secondary | ICD-10-CM

## 2019-11-24 NOTE — Progress Notes (Signed)
Subjective: 59 year old male presents the office today for evaluation of heel pain, plantar fasciitis.  He also has neuropathy and he has numbness to his feet.  He presents to pick up orthotics.  He still under the care of neurosurgery for spinal fracture. Denies any systemic complaints such as fevers, chills, nausea, vomiting. No acute changes since last appointment, and no other complaints at this time.   Objective: AAO x3, NAD DP/PT pulses palpable bilaterally, CRT less than 3 seconds There is continuation of tenderness on the plantar medial tubercle of the calcaneus at the insertion of plantar fascial bilaterally.  There is no pain with compression of calcaneus.  No pain with Achilles tendon.  No significant edema, erythema. No open lesions or pre-ulcerative lesions.  No pain with calf compression, swelling, warmth, erythema  Assessment: Chronic plantar fasciitis, heel pain  Plan: -All treatment options discussed with the patient including all alternatives, risks, complications.  -Orthotics were dispensed today.  Oral and written break-in instructions were discussed.  Discussed gentle stretch exercises for the heel itself but unfortunately will hold off on other physical therapy for due to his back.  Also hold off on steroids or anti-inflammatories currently.  He can use Voltaren gel to the heels. -Patient encouraged to call the office with any questions, concerns, change in symptoms.   Trula Slade DPM

## 2019-11-27 NOTE — Progress Notes (Signed)
Patient came in today to pick up custom made foot orthotics.  The goals were accomplished and the patient reported no dissatisfaction with said orthotics.  Patient was advised of breakin period and how to report any issues. 

## 2019-11-30 ENCOUNTER — Encounter: Payer: Self-pay | Admitting: *Deleted

## 2019-12-06 ENCOUNTER — Telehealth: Payer: Self-pay | Admitting: *Deleted

## 2019-12-06 NOTE — Telephone Encounter (Signed)
I agree

## 2019-12-06 NOTE — Telephone Encounter (Signed)
Patient called in for appt and was transferred to triage. Patient c/o 6 day hx of elevated BPs (155/96-179/108), "heart fluttering all day," H/A and intermittent dizziness. Denies fever but states he feels warm. Offered appt this AM in [place]. Patient declined, prefers appt for tomorrow due to transportation. Patient given [facility] appt tomorrow at 1015. Advised to call 911 if symptoms worsen and not wait for tomorrow's appt. He is in agreement. Hubbard Hartshorn, BSN, RN-BC

## 2019-12-07 ENCOUNTER — Encounter: Payer: Self-pay | Admitting: Internal Medicine

## 2019-12-07 ENCOUNTER — Ambulatory Visit (INDEPENDENT_AMBULATORY_CARE_PROVIDER_SITE_OTHER): Payer: Medicare Other | Admitting: Internal Medicine

## 2019-12-07 VITALS — BP 148/92 | HR 90 | Temp 98.9°F | Ht 73.0 in | Wt 263.9 lb

## 2019-12-07 DIAGNOSIS — I10 Essential (primary) hypertension: Secondary | ICD-10-CM | POA: Diagnosis not present

## 2019-12-07 DIAGNOSIS — E119 Type 2 diabetes mellitus without complications: Secondary | ICD-10-CM | POA: Diagnosis not present

## 2019-12-07 LAB — GLUCOSE, CAPILLARY: Glucose-Capillary: 84 mg/dL (ref 70–99)

## 2019-12-07 LAB — POCT GLYCOSYLATED HEMOGLOBIN (HGB A1C): Hemoglobin A1C: 6.6 % — AB (ref 4.0–5.6)

## 2019-12-07 MED ORDER — LOSARTAN POTASSIUM 25 MG PO TABS: 25.0000 mg | ORAL_TABLET | Freq: Every day | ORAL | 0 refills | Status: DC

## 2019-12-07 NOTE — Progress Notes (Signed)
CC: HTN, T2DM  HPI:  [name] is a 59 y.o. male with PMH below.  Today we will address HTN, T2DM  Please see A&P for status of the patient's chronic medical conditions  Past Medical History:  Diagnosis Date  . Beta-hemolytic group A streptococcal sepsis ([place])   . Bipolar I disorder (HCC) 1991   Dr. Toy Care  . Borderline hyperlipidemia    history of  . Childhood asthma   . COPD (chronic obstructive pulmonary disease) ([place])   . Diabetes mellitus, new onset ([place]) 12/22/2017  . Elevated glucose   . Fibromyalgia   . GERD (gastroesophageal reflux disease)   . HTN (hypertension)   . Low vitamin B12 level 06/07/2018  . Lower back pain   . Lumbar vertebral fracture (HCC) 01/29/2019   L1  . Paranoia ([place])   . Tobacco abuse   . Tobacco abuse   . Vitamin D deficiency    Review of Systems: ROS: Pulmonary: pt denies increased work of breathing, shortness of breath,  Cardiac: pt denies palpitations, chest pain,  Abdominal: pt denies abdominal pain, nausea, vomiting, or diarrhea   Physical Exam:  Vitals:   12/07/19 1039  BP: (!) 148/92  Pulse: 90  Temp: 98.9 F (37.2 C)  TempSrc: Oral  SpO2: 97%  Weight: 263 lb 14.4 oz (119.7 kg)  Height: 6' 1"  (1.854 m)   Cardiac: JVD flat, normal rate and rhythm, clear s1 and s2, no murmurs, rubs or gallops, no LE edema Pulmonary: CTAB, not in distress Abdominal: non distended abdomen, soft and nontender Psych: Alert, conversant, in good spirits   Social History   Socioeconomic History  . Marital status: Married    Spouse name: Not on file  . Number of children: Not on file  . Years of education: Not on file  . Highest education level: Not on file  Occupational History  . Not on file  Tobacco Use  . Smoking status: Former Smoker    Packs/day: 0.30    Years: 45.00    Pack years: 13.50    Types: Cigarettes    Quit date: 07/10/2017    Years since quitting: 2.4  . Smokeless tobacco: Current User    Types: Snuff  .  Tobacco comment: Quit 2 weeks ago.  Substance and Sexual Activity  . Alcohol use: Yes    Comment: Beer twice a week.  . Drug use: No  . Sexual activity: Not on file  Other Topics Concern  . Not on file  Social History Narrative   Lives with wife Jesus Martin (married 21 years) and son. Has 4 dogs and fish. Not employed. Woodworking for fun. HS graduate.       Quit smoking in 2008. 26 pack years. No recreational drug use. Drinks 1 glass of wine per month. Does not exercise.    Social Determinants of Health   Financial Resource Strain:   . Difficulty of Paying Living Expenses:   Food Insecurity:   . Worried About Charity fundraiser in the Last Year:   . Arboriculturist in the Last Year:   Transportation Needs:   . Film/video editor (Medical):   Marland Kitchen Lack of Transportation (Non-Medical):   Physical Activity:   . Days of Exercise per Week:   . Minutes of Exercise per Session:   Stress:   . Feeling of Stress :   Social Connections:   . Frequency of Communication with Friends and Family:   . Frequency of  Social Gatherings with Friends and Family:   . Attends Religious Services:   . Active Member of Clubs or Organizations:   . Attends Archivist Meetings:   Marland Kitchen Marital Status:   Intimate Partner Violence:   . Fear of Current or Ex-Partner:   . Emotionally Abused:   Marland Kitchen Physically Abused:   . Sexually Abused:     Family History  Adopted: Yes    Assessment & Plan:   See Encounters Tab for problem based charting.  Patient discussed with Dr. Philipp Ovens

## 2019-12-07 NOTE — Addendum Note (Signed)
Addended by: Guadlupe Spanish B on: 12/07/2019 01:12 PM   Modules accepted: Orders

## 2019-12-07 NOTE — Assessment & Plan Note (Addendum)
Lab Results  Component Value Date   HGBA1C 6.6 (A) 12/07/2019   Pt well controlled on insulin 75/25 at 15 u BID and full dose metformin.    -no changes

## 2019-12-07 NOTE — Assessment & Plan Note (Signed)
He has been doing well on amlodipine 25m for some time.  In the past he was on triple therapy.  His bp has slowly increased and he reports worsening bp this month.  He has not increased his salt intake.  He is less active but has not gaines significant weight.  No otc cold medicines.    -will add losartan -bmp today and at follow up

## 2019-12-07 NOTE — Progress Notes (Signed)
Internal Medicine Clinic Attending  Case discussed with Dr. Winfrey  at the time of the visit.  We reviewed the resident's history and exam and pertinent patient test results.  I agree with the assessment, diagnosis, and plan of care documented in the resident's note.  

## 2019-12-07 NOTE — Patient Instructions (Addendum)
Mr. Wirz we will start you on losartan for your blood pressure.  Please follow up in one month and we will recheck your bp and lab work.

## 2019-12-10 ENCOUNTER — Other Ambulatory Visit: Payer: Self-pay | Admitting: Internal Medicine

## 2019-12-11 ENCOUNTER — Telehealth: Payer: Self-pay | Admitting: *Deleted

## 2019-12-11 NOTE — Telephone Encounter (Signed)
Call from pt - stated he was here Thursday, started on new medication for high BP which he states is not working. Stated he started Losartan on Thursday.  5/1 - am 165/94 ; pm 143/85 5/2- am 171/99 ; pm 164/103 5/3 - am 146/91 Stated his HR is 95 - 100. Please advise.

## 2019-12-11 NOTE — Telephone Encounter (Signed)
Thank you :)

## 2019-12-11 NOTE — Telephone Encounter (Signed)
It will take longer than one weekend for a new BP med to fully work. Give up to 4 weeks for full effect

## 2019-12-11 NOTE — Telephone Encounter (Signed)
Pt called / informed it takes up to 4 weeks for full effect of his BP med per Dr Lynnae January. He stated I have to suffer for 4 weeks; stated his "heart is not beating right". Pt agreed to come in - [facility] appt scheduled tomorrow @ 0845 AM. Informed if condition worsens, go to ER - voiced understanding.

## 2019-12-12 ENCOUNTER — Ambulatory Visit (HOSPITAL_COMMUNITY)
Admission: RE | Admit: 2019-12-12 | Discharge: 2019-12-12 | Disposition: A | Discharge status: Home / Self Care | Payer: Medicare Other | Source: Ambulatory Visit | Attending: Internal Medicine | Admitting: Internal Medicine

## 2019-12-12 ENCOUNTER — Other Ambulatory Visit: Payer: Self-pay

## 2019-12-12 ENCOUNTER — Ambulatory Visit (INDEPENDENT_AMBULATORY_CARE_PROVIDER_SITE_OTHER): Payer: Medicare Other | Admitting: Internal Medicine

## 2019-12-12 ENCOUNTER — Encounter: Payer: Self-pay | Admitting: Internal Medicine

## 2019-12-12 VITALS — BP 143/96 | HR 100 | Temp 97.9°F | Ht 73.0 in | Wt 265.1 lb

## 2019-12-12 DIAGNOSIS — R079 Chest pain, unspecified: Secondary | ICD-10-CM | POA: Insufficient documentation

## 2019-12-12 DIAGNOSIS — I10 Essential (primary) hypertension: Secondary | ICD-10-CM

## 2019-12-12 HISTORY — DX: Chest pain, unspecified: R07.9

## 2019-12-12 LAB — BRAIN NATRIURETIC PEPTIDE: B Natriuretic Peptide: 16.9 pg/mL (ref 0.0–100.0)

## 2019-12-12 MED ORDER — FUROSEMIDE 40 MG PO TABS
40.0000 mg | ORAL_TABLET | Freq: Every day | ORAL | 0 refills | Status: DC
Start: 2019-12-12 — End: 2021-02-27

## 2019-12-12 NOTE — Progress Notes (Signed)
   CC: elevated BP  HPI:  Jesus Martin is a 59 y.o. male with PMHx as listed below presenting with concerns of elevated BP readings at home. Patient is on amlodipine 69m daily and was recently started on losartan 265mdaily by PCP. He notes that his SBP has remained in 15856Dith diastolic in the 9014-970YHe also endorses left sided chest pressure, headaches and dizziness that is worsened with exertion over the past few weeks. Unable to distinguish whether worsening dyspnea from his baseline. Please see problem based charting for further A/P.   Past Medical History:  Diagnosis Date  . Beta-hemolytic group A streptococcal sepsis ([facility]  . Bipolar I disorder (HCC) 1991   Dr. KaToy Care. Borderline hyperlipidemia    history of  . Childhood asthma   . COPD (chronic obstructive pulmonary disease) ([facility]  . Diabetes mellitus, new onset ([facility] [place]/2019  . Elevated glucose   . Fibromyalgia   . GERD (gastroesophageal reflux disease)   . HTN (hypertension)   . Low vitamin B12 level 06/07/2018  . Lower back pain   . Lumbar vertebral fracture (HCC) 01/29/2019   L1  . Paranoia ([place]  . Tobacco abuse   . Tobacco abuse   . Vitamin D deficiency    Review of Systems:  Negative except as stated in HPI.   Physical Exam:  Vitals:   12/12/19 0908  BP: (!) 143/96  Pulse: 100  Temp: 97.9 F (36.6 C)  TempSrc: Oral  SpO2: 98%  Weight: 265 lb 1.6 oz (120.2 kg)  Height: 6' 1"  (1.854 m)   Physical Exam Vitals reviewed.  Constitutional:      General: He is not in acute distress.    Appearance: He is obese. He is not ill-appearing or diaphoretic.  HENT:     Head: Normocephalic and atraumatic.  Cardiovascular:     Rate and Rhythm: Regular rhythm. Tachycardia present.     Pulses: Normal pulses.     Heart sounds: Normal heart sounds.     Comments: Unable to assess JVD 2/2 body habitus Pulmonary:     Effort: Pulmonary effort is normal. No respiratory distress.     Breath sounds: No  wheezing or rhonchi.     Comments: Decreased breath sounds in all lung fields; no crackles noted  Abdominal:     General: Bowel sounds are normal. There is distension.     Tenderness: There is no abdominal tenderness. There is no guarding or rebound.     Hernia: No hernia is present.  Musculoskeletal:        General: Normal range of motion.     Cervical back: Normal range of motion and neck supple.     Right lower leg: Edema present.     Left lower leg: Edema present.     Comments: 1+ pitting edema in bilateral lower extremities to mid-tibia  Skin:    General: Skin is warm and dry.     Capillary Refill: Capillary refill takes less than 2 seconds.  Neurological:     General: No focal deficit present.     Mental Status: He is alert and oriented to person, place, and time. Mental status is at baseline.     Sensory: No sensory deficit.     Motor: No weakness.      Assessment & Plan:   See Encounters Tab for problem based charting.  Patient discussed with Dr. BuLynnae January

## 2019-12-12 NOTE — Patient Instructions (Addendum)
Jesus Martin,    It was a pleasure seeing you in clinic. Today we discussed:  Hypertension: Please continue to take your amlodipine 10m daily and losartan 25 mg daily.  Chest pressure: We are obtaining further testing including EKG, Echo, lab work and urine studies to assess for what may be causing this. You did have some fluid on your legs for which I am prescribing Lasix 479mdaily. Please schedule a follow up appointment in 1 week.   Please contact usKoreaf you have any concerns. If you have worsening of headaches, vision changes, weakness, chest pain or shortness of breath, please seek emergent medical attention.   Thank you!

## 2019-12-12 NOTE — Assessment & Plan Note (Addendum)
Patient presenting with concerns of left sided chest pressure, headache and dizziness when standing with intermittent palpitations improved with lying down. However, he does have some trouble with breathing while lying flat for extended periods of time. He notes that this has been ongoing for the past two weeks but is getting worse recently. He notes that this is worse with exertion. Unable to quantify if he has worsening shortness of breath due to underlying COPD. On examination, patient does have 1+ bilateral pitting edema to mid tibia. No murmurs or gallops noted on examination and diffusely decreased breath sounds without crackles or wheezing appreciated. Unable to assess for JVD due to body habitus. Abdomen does appear to be distended, although patient notes that this is unchanged from baseline. Noted to have ~2lb weight gain since Thursday and ~6lb weight gain since December 2020. EKG obtained in office without any acute ischemic changes noted. BNP wnl.   Plan:  - Obtaining Echo (scheduled for 12/13/2019) - Urine microalbumin/cr ratio - Lasix 60m daily - RTC in 1 week

## 2019-12-12 NOTE — Assessment & Plan Note (Addendum)
Patient presenting for follow up of his hypertension. He was recently seen by his PCP on 4/29 for this and losartan was added to his medication regimen. Patient notes continued elevated BP with SBP 140-150s and DBP 90-100s. Patient's wife endorses some dietary indiscretion. Suspect this could be contributing to his elevated BP readings.  Will continue with current regimen and check in 1 week.  BP Readings from Last 3 Encounters:  12/12/19 (!) 143/96  12/07/19 (!) 148/92  08/07/19 (!) 138/94   Plan: BMP today Continue with current regimen of amlodipine 63m daily and losartan 254mdaily

## 2019-12-13 ENCOUNTER — Other Ambulatory Visit: Payer: Self-pay

## 2019-12-13 ENCOUNTER — Ambulatory Visit (HOSPITAL_COMMUNITY)
Admission: RE | Admit: 2019-12-13 | Discharge: 2019-12-13 | Disposition: A | Discharge status: Home / Self Care | Payer: Medicare Other | Source: Ambulatory Visit | Attending: Internal Medicine | Admitting: Internal Medicine

## 2019-12-13 DIAGNOSIS — E785 Hyperlipidemia, unspecified: Secondary | ICD-10-CM | POA: Insufficient documentation

## 2019-12-13 DIAGNOSIS — R079 Chest pain, unspecified: Secondary | ICD-10-CM | POA: Diagnosis not present

## 2019-12-13 DIAGNOSIS — E119 Type 2 diabetes mellitus without complications: Secondary | ICD-10-CM | POA: Diagnosis not present

## 2019-12-13 DIAGNOSIS — I10 Essential (primary) hypertension: Secondary | ICD-10-CM | POA: Diagnosis not present

## 2019-12-13 LAB — BMP8+ANION GAP
Anion Gap: 17 mmol/L (ref 10.0–18.0)
BUN/Creatinine Ratio: 16 (ref 9–20)
BUN: 17 mg/dL (ref 6–24)
CO2: 23 mmol/L (ref 20–29)
Calcium: 10.4 mg/dL — ABNORMAL HIGH (ref 8.7–10.2)
Chloride: 102 mmol/L (ref 96–106)
Creatinine, Ser: 1.07 mg/dL (ref 0.76–1.27)
GFR calc Af Amer: 88 mL/min/{1.73_m2} (ref >59)
GFR calc non Af Amer: 76 mL/min/{1.73_m2} (ref >59)
Glucose: 122 mg/dL — ABNORMAL HIGH (ref 65–99)
Potassium: 4.2 mmol/L (ref 3.5–5.2)
Sodium: 142 mmol/L (ref 134–144)

## 2019-12-13 LAB — MICROALBUMIN / CREATININE URINE RATIO
Creatinine, Urine: 40.3 mg/dL
Microalb/Creat Ratio: 18 mg/g creat (ref 0–29)
Microalbumin, Urine: 7.4 ug/mL

## 2019-12-13 NOTE — Progress Notes (Signed)
Echocardiogram 2D Echocardiogram has been performed.  Oneal Deputy Dalasia Predmore 12/13/2019, 3:30 PM

## 2019-12-15 NOTE — Addendum Note (Signed)
Addended by: Larey Dresser A on: 12/15/2019 03:41 PM   Modules accepted: Level of Service

## 2019-12-15 NOTE — Progress Notes (Signed)
Internal Medicine Clinic Attending  Case discussed with Dr. Marva Panda at the time of the visit.  We reviewed the resident's history and exam and pertinent patient test results.  I agree with the assessment, diagnosis, and plan of care documented in the resident's note. His chest pain is an undiagnosed new problem with uncertain prognosis and required prescription drug management.

## 2019-12-18 ENCOUNTER — Other Ambulatory Visit: Payer: Self-pay

## 2019-12-18 ENCOUNTER — Ambulatory Visit (HOSPITAL_COMMUNITY)
Admission: RE | Admit: 2019-12-18 | Discharge: 2019-12-18 | Disposition: A | Discharge status: Home / Self Care | Payer: Medicare Other | Source: Ambulatory Visit | Attending: Internal Medicine | Admitting: Internal Medicine

## 2019-12-20 ENCOUNTER — Ambulatory Visit (INDEPENDENT_AMBULATORY_CARE_PROVIDER_SITE_OTHER): Payer: Medicare Other | Admitting: Internal Medicine

## 2019-12-20 VITALS — BP 148/100 | HR 100 | Temp 97.7°F | Ht 73.0 in | Wt 266.9 lb

## 2019-12-20 DIAGNOSIS — I10 Essential (primary) hypertension: Secondary | ICD-10-CM

## 2019-12-20 DIAGNOSIS — Z79899 Other long term (current) drug therapy: Secondary | ICD-10-CM

## 2019-12-20 DIAGNOSIS — F17210 Nicotine dependence, cigarettes, uncomplicated: Secondary | ICD-10-CM | POA: Diagnosis not present

## 2019-12-20 DIAGNOSIS — R079 Chest pain, unspecified: Secondary | ICD-10-CM

## 2019-12-20 DIAGNOSIS — J449 Chronic obstructive pulmonary disease, unspecified: Secondary | ICD-10-CM

## 2019-12-20 DIAGNOSIS — Z87891 Personal history of nicotine dependence: Secondary | ICD-10-CM

## 2019-12-20 MED ORDER — LOSARTAN POTASSIUM 25 MG PO TABS: 50.0000 mg | ORAL_TABLET | Freq: Every day | ORAL | 0 refills | Status: DC

## 2019-12-20 NOTE — Assessment & Plan Note (Signed)
Endorsing some wheezing on exam today. Denies significant increase in sputum production. O2 sat wnl. Mentions adherence to daily Trelegy use. Not in acute exacerbation. Advised to use his nebulizer machine when he returns home.  - C/w Trelegy - Duoneb PRN - C/w encourage tobacco cessation

## 2019-12-20 NOTE — Assessment & Plan Note (Signed)
BP Readings from Last 3 Encounters:  12/20/19 (!) 148/100  12/12/19 (!) 143/96  12/07/19 (!) 148/92   Continues to be elevated. Currently on amlodipine 55m, losartan 264mdaily. Mentions adherence to bp meds at home and wife confirms. Chart reviews shows chronically elevated bp with up-titration in meds. Denies any blurry vision or headaches  - Increase losartan to 5022maily - C/w amlodipine 7m25mRTC in 4 weeks for bmp and bp check

## 2019-12-20 NOTE — Assessment & Plan Note (Addendum)
Jesus Martin is a 59 yo M w/ PMH of T2DM, HTN, Bipolar, HLD and GERD presenting to [facility] for f/u visit for chest pain. He was seen last week for similar symptoms. Mentions left chest pressure without radiation that last 30-1 hr. Not exacerbated by exertion. No inciting factors. Intermittent onset. He mentions having 'bad heartburn' in the past but quality of the pressure feels different. He denies any associated diaphoresis, numbness, nausea, vomiting, dyspnea. He is unaware of family hx of cardiac dz. Previously on aspirin but stopped due to rash. Underwent EKG, Echocardiogram last week without significant findings.  A/P Jesus Martin presents with atypical chest pain. Seen last week for same issue but found to have no ischemic changes on EKG. Continues to endorse intermittent chest pressure. Description of pain not consistent with angina but CT chest from last year reviewed with evidence of coronary artery disease. On atorvastatin. Risk factors include diabetes, hld, htn. Likely need stress test for further assessment.  - Referral to cardiology - Discussed red-flag sxs for MI and described pre-cautions for return to ED

## 2019-12-20 NOTE — Patient Instructions (Addendum)
Thank you for allowing [place] to provide your care today. Today we discussed your chest pressure    I have ordered no labs for you. I will call if any are abnormal.    Today we made the following changes to your medications.    Increase your losartan to 27m daily  Please follow-up in 4 weeks.    Should you have any questions or concerns please call the internal medicine clinic at [phone]     Nonspecific Chest Pain Chest pain can be caused by many different conditions. Some causes of chest pain can be life-threatening. These will require treatment right away. Serious causes of chest pain include:  Heart attack.  A tear in the body's main blood vessel.  Redness and swelling (inflammation) around your heart.  Blood clot in your lungs. Other causes of chest pain may not be so serious. These include:  Heartburn.  Anxiety or stress.  Damage to bones or muscles in your chest.  Lung infections. Chest pain can feel like:  Pain or discomfort in your chest.  Crushing, pressure, aching, or squeezing pain.  Burning or tingling.  Dull or sharp pain that is worse when you move, cough, or take a deep breath.  Pain or discomfort that is also felt in your back, neck, jaw, shoulder, or arm, or pain that spreads to any of these areas. It is hard to know whether your pain is caused by something that is serious or something that is not so serious. So it is important to see your doctor right away if you have chest pain. Follow these instructions at home: Medicines  Take over-the-counter and prescription medicines only as told by your doctor.  If you were prescribed an antibiotic medicine, take it as told by your doctor. Do not stop taking the antibiotic even if you start to feel better. Lifestyle   Rest as told by your doctor.  Do not use any products that contain nicotine or tobacco, such as cigarettes, e-cigarettes, and chewing tobacco. If you need help quitting, ask your  doctor.  Do not drink alcohol.  Make lifestyle changes as told by your doctor. These may include: ? Getting regular exercise. Ask your doctor what activities are safe for you. ? Eating a heart-healthy diet. A diet and nutrition specialist (dietitian) can help you to learn healthy eating options. ? Staying at a healthy weight. ? Treating diabetes or high blood pressure, if needed. ? Lowering your stress. Activities such as yoga and relaxation techniques can help. General instructions  Pay attention to any changes in your symptoms. Tell your doctor about them or any new symptoms.  Avoid any activities that cause chest pain.  Keep all follow-up visits as told by your doctor. This is important. You may need more testing if your chest pain does not go away. Contact a doctor if:  Your chest pain does not go away.  You feel depressed.  You have a fever. Get help right away if:  Your chest pain is worse.  You have a cough that gets worse, or you cough up blood.  You have very bad (severe) pain in your belly (abdomen).  You pass out (faint).  You have either of these for no clear reason: ? Sudden chest discomfort. ? Sudden discomfort in your arms, back, neck, or jaw.  You have shortness of breath at any time.  You suddenly start to sweat, or your skin gets clammy.  You feel sick to your stomach (nauseous).  You  throw up (vomit).  You suddenly feel lightheaded or dizzy.  You feel very weak or tired.  Your heart starts to beat fast, or it feels like it is skipping beats. These symptoms may be an emergency. Do not wait to see if the symptoms will go away. Get medical help right away. Call your local emergency services (911 in the [place] Do not drive yourself to the hospital. Summary  Chest pain can be caused by many different conditions. The cause may be serious and need treatment right away. If you have chest pain, see your doctor right away.  Follow your doctor's  instructions for taking medicines and making lifestyle changes.  Keep all follow-up visits as told by your doctor. This includes visits for any further testing if your chest pain does not go away.  Be sure to know the signs that show that your condition has become worse. Get help right away if you have these symptoms. This information is not intended to replace advice given to you by your health care provider. Make sure you discuss any questions you have with your health care provider. Document Revised: 01/27/2018 Document Reviewed: 01/27/2018 Elsevier Patient Education  2020 [employer].

## 2019-12-20 NOTE — Assessment & Plan Note (Signed)
Continues to smoke about 3-4 cigarettes daily. Mentions smoker's cough. Has tried to quit multiple times in the past. Wife states she has nicotine patches at home and will 'encourage' him to quit.  - C/w counsel on tobacco cessation

## 2019-12-20 NOTE — Progress Notes (Signed)
CC: Chest pain  HPI: Jesus Martin is a 59 y.o. with PMH listed below presenting with complaint of chest pain. Please see problem based assessment and plan for further details.  Past Medical History:  Diagnosis Date  . Beta-hemolytic group A streptococcal sepsis ([place])   . Bipolar I disorder (HCC) 1991   Jesus Martin  . Borderline hyperlipidemia    history of  . Childhood asthma   . COPD (chronic obstructive pulmonary disease) ([place])   . Diabetes mellitus, new onset ([place]) 12/22/2017  . Elevated glucose   . Fibromyalgia   . GERD (gastroesophageal reflux disease)   . HTN (hypertension)   . Low vitamin B12 level 06/07/2018  . Lower back pain   . Lumbar vertebral fracture (HCC) 01/29/2019   L1  . Paranoia ([place])   . Tobacco abuse   . Tobacco abuse   . Vitamin D deficiency    Review of Systems: Review of Systems  Constitutional: Negative for chills, fever and malaise/fatigue.  Eyes: Negative for blurred vision.  Respiratory: Positive for cough and wheezing. Negative for sputum production and shortness of breath.   Cardiovascular: Positive for chest pain and palpitations. Negative for leg swelling.  Gastrointestinal: Negative for constipation, diarrhea, nausea and vomiting.  All other systems reviewed and are negative.   Physical Exam: Vitals:   12/20/19 0902 12/20/19 0930  BP: (!) 150/86 (!) 148/100  Pulse: 100   Temp: 97.7 F (36.5 C)   TempSrc: Oral   SpO2: 98%   Weight: 266 lb 14.4 oz (121.1 kg)   Height: 6' 1"  (1.854 m)     Physical Exam  Constitutional: He is oriented to person, place, and time. He appears well-developed and well-nourished. No distress.  HENT:  Mouth/Throat: Oropharynx is clear and moist.  Cardiovascular: Normal rate, normal heart sounds and intact distal pulses.  No murmur heard. Tachycardic  Respiratory: Effort normal. He has wheezes (mild expiratory wheezes). He has no rales.  GI: Soft. Bowel sounds are normal. He exhibits no  distension. There is no abdominal tenderness.  Musculoskeletal:        General: No edema. Normal range of motion.  Neurological: He is Martin and oriented to person, place, and time. No cranial nerve deficit.  Skin: Skin is warm and dry.    Assessment & Plan:   Former smoker Continues to smoke about 3-4 cigarettes daily. Mentions smoker's cough. Has tried to quit multiple times in the past. Wife states she has nicotine patches at home and will 'encourage' him to quit.  - C/w counsel on tobacco cessation  Hypertension BP Readings from Last 3 Encounters:  12/20/19 (!) 148/100  12/12/19 (!) 143/96  12/07/19 (!) 148/92   Continues to be elevated. Currently on amlodipine 24m, losartan 21mdaily. Mentions adherence to bp meds at home and wife confirms. Chart reviews shows chronically elevated bp with up-titration in meds. Denies any blurry vision or headaches  - Increase losartan to 5020maily - C/w amlodipine 80m65mRTC in 4 weeks for bmp and bp check  Chest pain Jesus Martin 59 y23M w/ PMH of T2DM, HTN, Bipolar, HLD and GERD presenting to [facility] f/u [facility] for chest pain. He was seen last week for similar symptoms. Mentions left chest pressure without radiation that last 30-1 hr. Not exacerbated by exertion. No inciting factors. Intermittent onset. He mentions having 'bad heartburn' in the past but quality of the pressure feels different. He denies any associated diaphoresis, numbness, nausea, vomiting, dyspnea.  He is unaware of family hx of cardiac dz. Previously on aspirin but stopped due to rash. Underwent EKG, Echocardiogram last week without significant findings.  A/P Jesus Martin presents with atypical chest pain. Seen last week for same issue but found to have no ischemic changes on EKG. Continues to endorse intermittent chest pressure. Description of pain not consistent with angina but CT chest from last year reviewed with evidence of coronary artery disease. On atorvastatin. Risk  factors include diabetes, hld, htn. Likely need stress test for further assessment.  - Referral to cardiology - Discussed red-flag sxs for MI and described pre-cautions for return to ED  COPD (chronic obstructive pulmonary disease) ([place]) Endorsing some wheezing on exam today. Denies significant increase in sputum production. O2 sat wnl. Mentions adherence to daily Trelegy use. Not in acute exacerbation. Advised to use his nebulizer machine when he returns home.  - C/w Trelegy - Duoneb PRN - C/w encourage tobacco cessation    Patient discussed with Jesus Martin   -Jesus Martin, PGY2 [name] Pager: [phone]

## 2019-12-21 NOTE — Progress Notes (Signed)
Internal Medicine Clinic Attending  Case discussed with Dr. Lee at the time of the visit.  We reviewed the resident's history and exam and pertinent patient test results.  I agree with the assessment, diagnosis, and plan of care documented in the resident's note.  Ainsley Deakins, M.D., Ph.D.  

## 2020-01-01 ENCOUNTER — Ambulatory Visit: Payer: Medicare Other | Admitting: Podiatry

## 2020-01-13 ENCOUNTER — Other Ambulatory Visit: Payer: Self-pay | Admitting: Internal Medicine
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
## 2020-01-29 ENCOUNTER — Encounter: Payer: Medicare HMO | Admitting: Internal Medicine

## 2020-01-29 ENCOUNTER — Telehealth: Payer: Self-pay

## 2020-01-29 ENCOUNTER — Encounter: Payer: Self-pay | Admitting: Internal Medicine

## 2020-01-29 NOTE — Telephone Encounter (Signed)
Missed appointment: I left a message on patient home number to give [place] a call back to reschedule or if need a Tele-health visit [place], [place] C6/21/20211:59 PM

## 2020-01-30 ENCOUNTER — Ambulatory Visit: Payer: Medicare Other | Admitting: Cardiovascular Disease

## 2020-02-06 ENCOUNTER — Encounter: Payer: Self-pay | Admitting: *Deleted

## 2020-02-14 ENCOUNTER — Other Ambulatory Visit: Payer: Self-pay | Admitting: Internal Medicine

## 2020-02-14 DIAGNOSIS — I10 Essential (primary) hypertension: Secondary | ICD-10-CM

## 2020-02-23 ENCOUNTER — Other Ambulatory Visit: Payer: Self-pay | Admitting: Internal Medicine

## 2020-02-23 ENCOUNTER — Telehealth: Payer: Self-pay

## 2020-02-23 NOTE — Telephone Encounter (Signed)
Patient will need to have an order written to d/c home oxygen. Will forward to [place]. Hubbard Hartshorn, BSN, RN-BC

## 2020-02-23 NOTE — Telephone Encounter (Signed)
Pt's wife states pt no longer need the oxygen, requesting the doctor to write a note. Please call back.

## 2020-02-26 ENCOUNTER — Ambulatory Visit: Payer: Medicare Other | Admitting: Podiatry

## 2020-02-27 NOTE — Telephone Encounter (Signed)
Has d/c order been placed?  DME company unable to pick up home O2 without a written order.  Please advise if patient needs a visit prior to discontinuing.Despina Hidden Cassady7/20/20212:00 PM

## 2020-02-29 ENCOUNTER — Other Ambulatory Visit: Payer: Self-pay | Admitting: Internal Medicine

## 2020-02-29 DIAGNOSIS — Z87891 Personal history of nicotine dependence: Secondary | ICD-10-CM

## 2020-02-29 DIAGNOSIS — J449 Chronic obstructive pulmonary disease, unspecified: Secondary | ICD-10-CM

## 2020-02-29 NOTE — Telephone Encounter (Signed)
Theophilus Bones, RN; Sandi Raveling, [place]; [place], Georges Mouse   Thank you

## 2020-02-29 NOTE — Telephone Encounter (Signed)
CM sent to Skeet Latch at [facility] that an order has been placed to d/c home oxygen. Hubbard Hartshorn, BSN, RN-BC

## 2020-03-07 NOTE — Addendum Note (Signed)
Addended by: Hulan Fray on: 03/07/2020 07:01 AM   Modules accepted: Orders

## 2020-03-11 ENCOUNTER — Ambulatory Visit: Payer: Medicare Other | Admitting: Podiatry

## 2020-03-12 ENCOUNTER — Other Ambulatory Visit: Payer: Self-pay | Admitting: *Deleted

## 2020-03-12 DIAGNOSIS — I10 Essential (primary) hypertension: Secondary | ICD-10-CM

## 2020-03-12 MED ORDER — LOSARTAN POTASSIUM 25 MG PO TABS: 50.0000 mg | ORAL_TABLET | Freq: Every day | ORAL | 0 refills | Status: DC

## 2020-03-14 ENCOUNTER — Other Ambulatory Visit: Payer: Self-pay

## 2020-03-14 ENCOUNTER — Other Ambulatory Visit: Payer: Self-pay | Admitting: *Deleted

## 2020-03-14 DIAGNOSIS — E119 Type 2 diabetes mellitus without complications: Secondary | ICD-10-CM

## 2020-03-14 DIAGNOSIS — G8929 Other chronic pain: Secondary | ICD-10-CM

## 2020-03-14 MED ORDER — OMEPRAZOLE 40 MG PO CPDR: 40.0000 mg | DELAYED_RELEASE_CAPSULE | Freq: Every day | ORAL | 0 refills | Status: DC

## 2020-03-14 MED ORDER — TRAMADOL HCL 50 MG PO TABS: 50.0000 mg | ORAL_TABLET | Freq: Two times a day (BID) | ORAL | 3 refills | Status: DC | PRN

## 2020-03-14 NOTE — Telephone Encounter (Signed)
error 

## 2020-03-14 NOTE — Telephone Encounter (Signed)
pregabalin (LYRICA) 150 MG capsule   traMADol (ULTRAM) 50 MG tablet, refill request @  [facility] [address] [place] - [address] Phone:  [phone]  Fax:  [phone]

## 2020-03-15 ENCOUNTER — Other Ambulatory Visit: Payer: Self-pay | Admitting: *Deleted

## 2020-03-15 DIAGNOSIS — I10 Essential (primary) hypertension: Secondary | ICD-10-CM

## 2020-03-15 MED ORDER — PREGABALIN 150 MG PO CAPS: 150.0000 mg | ORAL_CAPSULE | Freq: Two times a day (BID) | ORAL | 5 refills | Status: DC

## 2020-03-15 MED ORDER — LOSARTAN POTASSIUM 25 MG PO TABS: 50.0000 mg | ORAL_TABLET | Freq: Every day | ORAL | 0 refills | Status: DC

## 2020-03-28 ENCOUNTER — Other Ambulatory Visit: Payer: Self-pay | Admitting: *Deleted

## 2020-03-28 DIAGNOSIS — E119 Type 2 diabetes mellitus without complications: Secondary | ICD-10-CM

## 2020-03-29 MED ORDER — METFORMIN HCL 1000 MG PO TABS: 1000.0000 mg | ORAL_TABLET | Freq: Two times a day (BID) | ORAL | 0 refills | Status: DC

## 2020-04-02 ENCOUNTER — Other Ambulatory Visit: Payer: Self-pay

## 2020-04-02 ENCOUNTER — Ambulatory Visit: Payer: Medicare Other | Admitting: Podiatry

## 2020-04-02 ENCOUNTER — Encounter: Payer: Self-pay | Admitting: Podiatry

## 2020-04-02 DIAGNOSIS — M722 Plantar fascial fibromatosis: Secondary | ICD-10-CM | POA: Diagnosis not present

## 2020-04-02 NOTE — Patient Instructions (Signed)

## 2020-04-08 NOTE — Progress Notes (Signed)
Subjective: 59 year old male presents the office today for follow-up evaluation of continued pain to his feet.  He states the pain to the bottom of the heel has much improved but has moved more towards the arch somewhat.  Denies any recent injury or trauma.  He states the Voltaren gel has been helpful as well as the orthotics.  He denies any recent injury or trauma or any changes otherwise.  He is requesting steroid injections today. Denies any systemic complaints such as fevers, chills, nausea, vomiting. No acute changes since last appointment, and no other complaints at this time.   Objective: AAO x3, NAD DP/PT pulses palpable bilaterally, CRT less than 3 seconds There is tenderness palpation still on the plantar medial tubercle of the calcaneus near the insertion of plantar fascia which is slightly distal to this area as well.  Mild discomfort in the arch of the foot on the medial band plantar fascia as well.  There is no pain with lateral compression of calcaneus.  There is no area of pinpoint tenderness. No pain with calf compression, swelling, warmth, erythema  Assessment: Bilateral plantar fasciitis  Plan: -All treatment options discussed with the patient including all alternatives, risks, complications.  -Steroid injections performed bilaterally.  See procedure notes below.  Continue orthotics, Voltaren gel, stretching, rehab exercises. Discussed PT and if no improvement MRI possibly  -Patient encouraged to call the office with any questions, concerns, change in symptoms.   Procedure: Injection Tendon/Ligament Discussed alternatives, risks, complications and verbal consent was obtained.  Location: Bilateral plantar fascia at the glabrous junction; medial approach. Skin Prep: Alcohol. Injectate: 0.5cc 0.5% marcaine plain, 0.5 cc 2% lidocaine plain and, 1 cc kenalog 10. Disposition: Patient tolerated procedure well. Injection site dressed with a band-aid.  Post-injection care was  discussed and return precautions discussed.   No follow-ups on file.  Trula Slade DPM

## 2020-04-16 ENCOUNTER — Encounter: Payer: Self-pay | Admitting: Student

## 2020-04-16 ENCOUNTER — Other Ambulatory Visit: Payer: Self-pay

## 2020-04-16 ENCOUNTER — Ambulatory Visit (INDEPENDENT_AMBULATORY_CARE_PROVIDER_SITE_OTHER): Payer: Medicare Other | Admitting: Student

## 2020-04-16 VITALS — BP 147/86 | HR 98 | Temp 97.7°F | Wt 262.2 lb

## 2020-04-16 DIAGNOSIS — E119 Type 2 diabetes mellitus without complications: Secondary | ICD-10-CM

## 2020-04-16 DIAGNOSIS — E0580 Other thyrotoxicosis without thyrotoxic crisis or storm: Secondary | ICD-10-CM

## 2020-04-16 DIAGNOSIS — Z794 Long term (current) use of insulin: Secondary | ICD-10-CM | POA: Diagnosis not present

## 2020-04-16 DIAGNOSIS — F17220 Nicotine dependence, chewing tobacco, uncomplicated: Secondary | ICD-10-CM | POA: Diagnosis not present

## 2020-04-16 DIAGNOSIS — I10 Essential (primary) hypertension: Secondary | ICD-10-CM | POA: Diagnosis not present

## 2020-04-16 DIAGNOSIS — R5383 Other fatigue: Secondary | ICD-10-CM | POA: Insufficient documentation

## 2020-04-16 DIAGNOSIS — R5382 Chronic fatigue, unspecified: Secondary | ICD-10-CM

## 2020-04-16 DIAGNOSIS — E0590 Thyrotoxicosis, unspecified without thyrotoxic crisis or storm: Secondary | ICD-10-CM

## 2020-04-16 DIAGNOSIS — F319 Bipolar disorder, unspecified: Secondary | ICD-10-CM

## 2020-04-16 DIAGNOSIS — J449 Chronic obstructive pulmonary disease, unspecified: Secondary | ICD-10-CM | POA: Diagnosis not present

## 2020-04-16 DIAGNOSIS — Z720 Tobacco use: Secondary | ICD-10-CM

## 2020-04-16 DIAGNOSIS — R918 Other nonspecific abnormal finding of lung field: Secondary | ICD-10-CM

## 2020-04-16 HISTORY — DX: Other fatigue: R53.83

## 2020-04-16 LAB — POCT GLYCOSYLATED HEMOGLOBIN (HGB A1C): Hemoglobin A1C: 6.4 % — AB (ref 4.0–5.6)

## 2020-04-16 LAB — GLUCOSE, CAPILLARY: Glucose-Capillary: 131 mg/dL — ABNORMAL HIGH (ref 70–99)

## 2020-04-16 NOTE — Assessment & Plan Note (Addendum)
Blood pressure continues to be elevated (147/86) at today's visit. Patient reports that his blood pressure at home continues to be in the 924Q-683 systolic and up to 41D-622 diastolic. He has been adherent to amlodipine 30m daily, however he has been off of his home losartan 567mfor the last several months until a few weeks ago. We have discussed the importance of continuing losartan 501maily and amlodipine 28m61mily. Although we considered adding HCTZ, given that he is currently taking lithium, he will not be able to start this medication due to interactions and no recent lithium level on file. PLAN: -Continue amlodipine 28mg36mly -Continue losartan 50mg 34my -Check CMP today -Consider adding third antihypertensive if continues to be uncontrolled at next visit -Repeat BP at next visit.

## 2020-04-16 NOTE — Assessment & Plan Note (Addendum)
Patient endorsing chronic multiple year long history of progressively worsening fatigue. We will obtain initial labs to rule out an underlying etiology such as anemia, malignancy, or hypothyroidism; however this may be related to patient's underlying psychiatric diagnoses and medications for which he follows with psychiatry for management. -CBC -CMP -TSH -CT Chest to rule out malignancy -Continue following with Dr. Toy Care for psychiatric diagnoses. -Consider testosterone check at next visit

## 2020-04-16 NOTE — Assessment & Plan Note (Addendum)
HbA1c improved to 6.4 from 6.6. Diabetes well-controlled on current regimen of metformin and insulin 75/25 15u BID.  -Continue metformin -Continue insulin 75/25 15u BID -Recheck HbA1c in three months

## 2020-04-16 NOTE — Assessment & Plan Note (Addendum)
Patient follows closely with [facility] for the past thirty years for his bipolar 1 disorder. He continues to take lamictal, lithium, and seroquel. Patient has not had his lithium level checked recently. -Lithium level -Fax lab results (CMP and lithium level) to [facility] Phone number: [phone] Fax number: [phone]

## 2020-04-16 NOTE — Progress Notes (Signed)
   CC: Follow-up of T2DM, HTN and COPD  HPI:  [name] is a 59 y.o. with past medical history of T2DM, HTN, COPD, tobacco use, bipolar 1 disorder who presents to clinic for routine follow-up. Please refer to Problem List for current HPI.  Past Medical History:  Diagnosis Date  . Beta-hemolytic group A streptococcal sepsis ([place])   . Bipolar I disorder (HCC) 1991   Dr. Toy Care  . Borderline hyperlipidemia    history of  . Childhood asthma   . COPD (chronic obstructive pulmonary disease) ([place])   . Diabetes mellitus, new onset ([place]) 12/22/2017  . Elevated glucose   . Fibromyalgia   . GERD (gastroesophageal reflux disease)   . HTN (hypertension)   . Low vitamin B12 level 06/07/2018  . Lower back pain   . Lumbar vertebral fracture (HCC) 01/29/2019   L1  . Paranoia ([place])   . Tobacco abuse   . Tobacco abuse   . Vitamin D deficiency    Family History  Adopted: Yes   Social History   Tobacco Use  . Smoking status: Current Every Day Smoker    Packs/day: 0.01    Years: 45.00    Pack years: 0.45    Types: Cigarettes    Last attempt to quit: 07/10/2017    Years since quitting: 2.7  . Smokeless tobacco: Current User    Types: Snuff  . Tobacco comment: 1-2 per day   Vaping Use  . Vaping Use: Never used  Substance Use Topics  . Alcohol use: Yes    Comment: Beer twice a week.  . Drug use: No   Review of Systems: Denies fevers, chills, recent bleeding, nausea, vomiting, abdominal pain.  Physical Exam:  Vitals:   04/16/20 1047  BP: (!) 147/86  Pulse: 98  Temp: 97.7 F (36.5 C)  TempSrc: Oral  SpO2: 97%  Weight: 262 lb 3.2 oz (118.9 kg)   Physical Exam Constitutional:      Appearance: Normal appearance.  Eyes:     Extraocular Movements: Extraocular movements intact.     Conjunctiva/sclera: Conjunctivae normal.  Cardiovascular:     Rate and Rhythm: Normal rate and regular rhythm.     Pulses: Normal pulses.     Heart sounds: Normal heart sounds.    Pulmonary:     Effort: Pulmonary effort is normal.     Breath sounds: Normal breath sounds.  Abdominal:     General: Abdomen is flat. Bowel sounds are normal.     Palpations: Abdomen is soft.     Tenderness: There is no abdominal tenderness.  Musculoskeletal:        General: Normal range of motion.     Cervical back: Normal range of motion and neck supple.  Skin:    General: Skin is warm and dry.     Capillary Refill: Capillary refill takes less than 2 seconds.  Neurological:     General: No focal deficit present.     Mental Status: He is alert. Mental status is at baseline.    Assessment & Plan:   See Encounters Tab for problem based charting.  Patient seen with Dr. Daryll Drown

## 2020-04-16 NOTE — Assessment & Plan Note (Signed)
Patient noted to have multiple small pulmonary nodules bilaterally on prior CT scan over a year ago. Given that he is continuing to smoke despite these findings, he is at high risk for malignancy and requires repeat imaging. -CT Chest Nodule follow-up

## 2020-04-16 NOTE — Assessment & Plan Note (Signed)
Patient is continuing to smoke tobacco despite quitting in the past. He has been slowly increasing his tobacco use over the past several months and is now up to a half pack per day. We discussed at length the importance of quitting tobacco use. He is not interested in trying alternative methods such as nicotine patches or gum to assist with quitting. -Continue to counsel on smoking cessation

## 2020-04-16 NOTE — Assessment & Plan Note (Addendum)
Patient has history of COPD in the setting of chronic, extensive tobacco use. Although he quit smoking in the past, he has been gradually increasing his tobacco use over the past several months. We discussed the importance of quitting smoking. He is not interested in therapies to assist in quitting smoking. -Continue trelegy -Continue duonebs PRN -Continue attempting smoking cessation.

## 2020-04-16 NOTE — Patient Instructions (Addendum)
It was a pleasure meeting you today Mr. Wawrzyniak. I've included a list of things that we talked about below:  Diabetes: Your hemoglobin A1c from three months ago looks great. We will recheck this level at your next appointment to see how your diabetes is doing then. Otherwise for the meantime, continue your current regimen of metformin and insulin.  Hypertension: Your blood pressure continues to be elevated today (147/86). It has been elevated at your last several visits despite increasing your medications. We want you to continue taking the amlodipine 19m and losartan 563mdaily as well. We will recheck your blood pressure at your next visit. We will also obtain a CMP to check how your kidneys are doing with this high blood pressure.  Fatigue: Given that you have had progressively worsening fatigue for the last several months, we will obtain some tests to rule out another cause of the fatigue such as low thyroid hormone levels or anemia. We will also check a lithium level to ensure that this is within normal limits.  COPD / Tobacco Use: We have discussed today the importance of quitting smoking for your overall health. Continue your trelegy and duonebs as needed. Additionally, since we saw small pulmonary nodules on your last CT scan, we will obtain a repeat CT scan to check on how these nodules are doing.

## 2020-04-17 LAB — CMP14 + ANION GAP
ALT: 36 IU/L (ref 0–44)
AST: 23 IU/L (ref 0–40)
Albumin/Globulin Ratio: 1.9 (ref 1.2–2.2)
Albumin: 4.8 g/dL (ref 3.8–4.9)
Alkaline Phosphatase: 64 IU/L (ref 48–121)
Anion Gap: 15 mmol/L (ref 10.0–18.0)
BUN/Creatinine Ratio: 8 — ABNORMAL LOW (ref 9–20)
BUN: 7 mg/dL (ref 6–24)
Bilirubin Total: <0.2 mg/dL (ref 0.0–1.2)
CO2: 24 mmol/L (ref 20–29)
Calcium: 10.1 mg/dL (ref 8.7–10.2)
Chloride: 103 mmol/L (ref 96–106)
Creatinine, Ser: 0.87 mg/dL (ref 0.76–1.27)
GFR calc Af Amer: 110 mL/min/{1.73_m2} (ref >59)
GFR calc non Af Amer: 95 mL/min/{1.73_m2} (ref >59)
Globulin, Total: 2.5 g/dL (ref 1.5–4.5)
Glucose: 122 mg/dL — ABNORMAL HIGH (ref 65–99)
Potassium: 4.4 mmol/L (ref 3.5–5.2)
Sodium: 142 mmol/L (ref 134–144)
Total Protein: 7.3 g/dL (ref 6.0–8.5)

## 2020-04-17 LAB — CBC
Hematocrit: 43.1 % (ref 37.5–51.0)
Hemoglobin: 15.1 g/dL (ref 13.0–17.7)
MCH: 32.4 pg (ref 26.6–33.0)
MCHC: 35 g/dL (ref 31.5–35.7)
MCV: 93 fL (ref 79–97)
Platelets: 292 10*3/uL (ref 150–450)
RBC: 4.66 x10E6/uL (ref 4.14–5.80)
RDW: 12.6 % (ref 11.6–15.4)
WBC: 10.8 10*3/uL (ref 3.4–10.8)

## 2020-04-17 LAB — TSH: TSH: 0.138 u[IU]/mL — ABNORMAL LOW (ref 0.450–4.500)

## 2020-04-17 LAB — LITHIUM LEVEL: Lithium Lvl: 0.4 mmol/L — ABNORMAL LOW (ref 0.5–1.2)

## 2020-04-17 NOTE — Progress Notes (Signed)
Internal Medicine Clinic Attending  I saw and evaluated the patient.  I personally confirmed the key portions of the history and exam documented by Dr. Johnson and I reviewed pertinent patient test results.  The assessment, diagnosis, and plan were formulated together and I agree with the documentation in the resident's note.  

## 2020-04-17 NOTE — Addendum Note (Signed)
Addended by: Gilles Chiquito B on: 04/17/2020 11:03 AM   Modules accepted: Level of Service

## 2020-04-23 ENCOUNTER — Other Ambulatory Visit: Payer: Self-pay

## 2020-04-23 ENCOUNTER — Telehealth: Payer: Self-pay | Admitting: Student

## 2020-04-23 ENCOUNTER — Ambulatory Visit: Payer: Medicare Other | Admitting: Orthotics

## 2020-04-23 DIAGNOSIS — G629 Polyneuropathy, unspecified: Secondary | ICD-10-CM

## 2020-04-23 DIAGNOSIS — E1149 Type 2 diabetes mellitus with other diabetic neurological complication: Secondary | ICD-10-CM

## 2020-04-23 DIAGNOSIS — E038 Other specified hypothyroidism: Secondary | ICD-10-CM

## 2020-04-23 DIAGNOSIS — M722 Plantar fascial fibromatosis: Secondary | ICD-10-CM

## 2020-04-23 NOTE — Progress Notes (Signed)
Patient picked up modificed f/o; I also excavated material LEFT to make f/o more flexible yet with the scaphoid pad to hug arch

## 2020-04-23 NOTE — Telephone Encounter (Signed)
Contacted Ms. Kelli Churn to discuss patient's results over the phone as [name] does not use a telephone, computer or online chart portal. I discussed the finding of a deficient TSH level at his prior visit and the need for obtaining Free T4 and T3 to further workup this finding. She understands and states that she will bring him to clinic tomorrow morning to obtain those labs.

## 2020-04-24 ENCOUNTER — Other Ambulatory Visit (INDEPENDENT_AMBULATORY_CARE_PROVIDER_SITE_OTHER): Payer: Medicare Other

## 2020-04-24 DIAGNOSIS — E038 Other specified hypothyroidism: Secondary | ICD-10-CM | POA: Diagnosis not present

## 2020-04-25 ENCOUNTER — Telehealth: Payer: Self-pay | Admitting: Student

## 2020-04-25 DIAGNOSIS — E0590 Thyrotoxicosis, unspecified without thyrotoxic crisis or storm: Secondary | ICD-10-CM | POA: Insufficient documentation

## 2020-04-25 LAB — T3: T3, Total: 81 ng/dL (ref 71–180)

## 2020-04-25 LAB — T4, FREE: Free T4: 1.2 ng/dL (ref 0.82–1.77)

## 2020-04-25 NOTE — Assessment & Plan Note (Signed)
Patient endorsed worsening fatigue, therefore TSH was collected to determine whether patient had undiagnosed hypothyroidism. TSH initially 0.138. Follow-up free T4 1.2 and T3 81. Patient's subclinical hyperthyroidism is secondary to an unknown etiology at this time, however given his underlying cardiovascular disease, he is at high risk for complications from this condition. -Repeat TSH, Free T4 and T3 at next clinic visit. -Collect TRAb at next clinic visit -Perform thorough thyroid physical examination at next visit -Depending on results, can consider radioactive iodine uptake.

## 2020-04-25 NOTE — Telephone Encounter (Signed)
Contacted Ms. Dirocco by telephone to discuss results of Mr. Harbeck's thyroid studies. I explained that patient's results are consistent with subclinical hyperthyroidism and that he will require further monitoring of this condition at future visits. She understands the findings and agrees with our plan. Please, refer to "Subclinical Hyperthyroidism" on patient's problem list for additional details regarding Assessment/Plan.

## 2020-05-08 NOTE — Addendum Note (Signed)
Addended by: Paulla Dolly on: 05/08/2020 02:51 PM   Modules accepted: Orders

## 2020-05-16 ENCOUNTER — Other Ambulatory Visit: Payer: Self-pay

## 2020-05-16 MED ORDER — VENTOLIN HFA 108 (90 BASE) MCG/ACT IN AERS: 2.0000 | INHALATION_SPRAY | Freq: Four times a day (QID) | RESPIRATORY_TRACT | 0 refills | Status: DC | PRN

## 2020-05-17 ENCOUNTER — Other Ambulatory Visit: Payer: Self-pay | Admitting: Student in an Organized Health Care Education/Training Program

## 2020-05-17 DIAGNOSIS — E119 Type 2 diabetes mellitus without complications: Secondary | ICD-10-CM

## 2020-05-20 ENCOUNTER — Other Ambulatory Visit: Payer: Self-pay

## 2020-05-20 MED ORDER — TRELEGY ELLIPTA 100-62.5-25 MCG/INH IN AEPB
INHALATION_SPRAY | RESPIRATORY_TRACT | 2 refills | Status: DC
Start: 2020-05-20 — End: 2020-09-02

## 2020-05-21 ENCOUNTER — Other Ambulatory Visit: Payer: Self-pay

## 2020-05-21 DIAGNOSIS — E119 Type 2 diabetes mellitus without complications: Secondary | ICD-10-CM

## 2020-05-21 DIAGNOSIS — E7800 Pure hypercholesterolemia, unspecified: Secondary | ICD-10-CM

## 2020-05-21 MED ORDER — ACCU-CHEK GUIDE VI STRP: ORAL_STRIP | 12 refills | Status: DC

## 2020-05-21 MED ORDER — ATORVASTATIN CALCIUM 40 MG PO TABS: 40.0000 mg | ORAL_TABLET | Freq: Every day | ORAL | 3 refills | Status: DC

## 2020-05-31 ENCOUNTER — Ambulatory Visit: Payer: Medicare Other | Admitting: Orthotics

## 2020-05-31 ENCOUNTER — Other Ambulatory Visit: Payer: Self-pay

## 2020-05-31 DIAGNOSIS — E1149 Type 2 diabetes mellitus with other diabetic neurological complication: Secondary | ICD-10-CM

## 2020-05-31 DIAGNOSIS — M722 Plantar fascial fibromatosis: Secondary | ICD-10-CM

## 2020-05-31 NOTE — Progress Notes (Signed)
Adjusted f/o by exvating material out of arch.

## 2020-06-05 ENCOUNTER — Other Ambulatory Visit: Payer: Self-pay | Admitting: Internal Medicine

## 2020-06-05 ENCOUNTER — Other Ambulatory Visit: Payer: Self-pay

## 2020-06-05 DIAGNOSIS — E119 Type 2 diabetes mellitus without complications: Secondary | ICD-10-CM

## 2020-06-05 DIAGNOSIS — G8929 Other chronic pain: Secondary | ICD-10-CM

## 2020-06-06 MED ORDER — TRAMADOL HCL 50 MG PO TABS: 50.0000 mg | ORAL_TABLET | Freq: Two times a day (BID) | ORAL | 3 refills | Status: DC | PRN

## 2020-06-06 MED ORDER — HUMALOG MIX 75/25 (75-25) 100 UNIT/ML ~~LOC~~ SUSP: 15.0000 [IU] | Freq: Two times a day (BID) | SUBCUTANEOUS | 1 refills | Status: DC

## 2020-06-06 NOTE — Telephone Encounter (Signed)
Per Pharmacist, Dr. Clinton Sawyer DEA is not going through. Hubbard Hartshorn, BSN, RN-BC

## 2020-06-06 NOTE — Telephone Encounter (Signed)
Pt's wife requesting to speak with a nurse about omeprazole (PRILOSEC) 40 MG capsule and traMADol (ULTRAM) 50 MG tablet. Pharmacy states doctor license has been expired. Please call pt back.

## 2020-06-12 ENCOUNTER — Other Ambulatory Visit: Payer: Self-pay

## 2020-06-12 ENCOUNTER — Ambulatory Visit (INDEPENDENT_AMBULATORY_CARE_PROVIDER_SITE_OTHER): Payer: Medicare Other | Admitting: Internal Medicine

## 2020-06-12 ENCOUNTER — Encounter: Payer: Self-pay | Admitting: Internal Medicine

## 2020-06-12 VITALS — BP 126/74 | HR 99 | Temp 98.1°F | Ht 73.0 in | Wt 265.1 lb

## 2020-06-12 DIAGNOSIS — E119 Type 2 diabetes mellitus without complications: Secondary | ICD-10-CM

## 2020-06-12 DIAGNOSIS — E0590 Thyrotoxicosis, unspecified without thyrotoxic crisis or storm: Secondary | ICD-10-CM

## 2020-06-12 DIAGNOSIS — R5382 Chronic fatigue, unspecified: Secondary | ICD-10-CM

## 2020-06-12 DIAGNOSIS — Z23 Encounter for immunization: Secondary | ICD-10-CM

## 2020-06-12 DIAGNOSIS — R918 Other nonspecific abnormal finding of lung field: Secondary | ICD-10-CM

## 2020-06-12 DIAGNOSIS — I10 Essential (primary) hypertension: Secondary | ICD-10-CM | POA: Diagnosis not present

## 2020-06-12 DIAGNOSIS — G8929 Other chronic pain: Secondary | ICD-10-CM | POA: Diagnosis not present

## 2020-06-12 LAB — POCT GLYCOSYLATED HEMOGLOBIN (HGB A1C): Hemoglobin A1C: 6.4 % — AB (ref 4.0–5.6)

## 2020-06-12 LAB — GLUCOSE, CAPILLARY: Glucose-Capillary: 126 mg/dL — ABNORMAL HIGH (ref 70–99)

## 2020-06-12 MED ORDER — CELECOXIB 200 MG PO CAPS: 200.0000 mg | ORAL_CAPSULE | Freq: Every day | ORAL | 2 refills | Status: DC

## 2020-06-12 NOTE — Assessment & Plan Note (Signed)
Patient states he was never called to schedule appointment for CT lung screening.  CT lung screening exam reordered today and scheduled.

## 2020-06-12 NOTE — Assessment & Plan Note (Signed)
Repeat hemoglobin A1c 6.4 today, same as last visit in September.  Well-controlled on current regimen of Metformin and insulin.  Plan: -Continue Metformin and insulin 75/25 15 units twice daily

## 2020-06-12 NOTE — Assessment & Plan Note (Signed)
Patient was recently seen in September for worsening fatigue.  At that time complete work-up was done which showed no acute cause of worsening fatigue.  Patient states that his fatigue has worsened over the past couple months to the point he is unable to complete his ADLs.  States he only gets 4 to 5 hours of sleep and then he wakes up due to chronic pain.  He has 3 young children at home that he takes care of.  He continues to get them ready for school, cooking cleaning however feels extremely drained by 7 PM typically.  Discussed with patient that his fatigue is likely multifactorial, ranging from the medications that he is on and his comorbidities as well as chronic pain.  Recommended at this time get patient's chronic pain under better control to help manage his fatigue.  He is also planning to follow-up with Dr. Toy Care, his psychiatrist for management of his bipolar disorder.  Patient reports that he is out of other medication options for his bipolar disorder however will discuss his symptoms with psychiatrist.  Plan: -Plan to get better control of chronic pain, start celecoxib 200 mg daily -Follow-up in 4 weeks if symptoms persist or worsen

## 2020-06-12 NOTE — Progress Notes (Signed)
   CC: Fatigue  HPI:  [name] is a 59 y.o. with a PMHx listed below presenting for follow up of his fatigue.   For details of today's visit and the status of his chronic medical issues please refer to the assessment and plan.   Past Medical History:  Diagnosis Date  . Beta-hemolytic group A streptococcal sepsis ([place])   . Bipolar I disorder (HCC) 1991   Dr. Toy Care  . Borderline hyperlipidemia    history of  . Childhood asthma   . COPD (chronic obstructive pulmonary disease) ([place])   . Diabetes mellitus, new onset ([place]) 12/22/2017  . Elevated glucose   . Fibromyalgia   . GERD (gastroesophageal reflux disease)   . HTN (hypertension)   . Low vitamin B12 level 06/07/2018  . Lower back pain   . Lumbar vertebral fracture (HCC) 01/29/2019   L1  . Paranoia ([place])   . Tobacco abuse   . Tobacco abuse   . Vitamin D deficiency    Review of Systems:    Review of Systems  Constitutional: Positive for malaise/fatigue. Negative for chills, fever and weight loss.  Respiratory: Negative for cough, sputum production and shortness of breath.   Cardiovascular: Negative for chest pain and leg swelling.  Gastrointestinal: Negative for abdominal pain, constipation, diarrhea, nausea and vomiting.  Genitourinary: Negative for dysuria, frequency, hematuria and urgency.  Musculoskeletal: Positive for back pain, joint pain and myalgias. Negative for falls.  Neurological: Positive for tingling. Negative for dizziness, weakness and headaches.  Psychiatric/Behavioral: Positive for memory loss. The patient has insomnia.     Physical Exam:  Vitals:   06/12/20 1346  BP: 126/74  Pulse: 99  Temp: 98.1 F (36.7 C)  TempSrc: Oral  SpO2: 96%  Weight: 265 lb 1.6 oz (120.2 kg)  Height: 6' 1"  (1.854 m)     Physical Exam Constitutional:      General: He is not in acute distress.    Appearance: Normal appearance. He is obese. He is not ill-appearing.  Eyes:     Extraocular Movements:  Extraocular movements intact.     Conjunctiva/sclera: Conjunctivae normal.  Cardiovascular:     Rate and Rhythm: Normal rate and regular rhythm.     Pulses: Normal pulses.     Heart sounds: Normal heart sounds. No murmur heard.  No friction rub. No gallop.   Pulmonary:     Effort: Pulmonary effort is normal. No respiratory distress.     Breath sounds: Normal breath sounds. No wheezing or rales.  Abdominal:     General: Abdomen is flat. Bowel sounds are normal. There is no distension.     Palpations: Abdomen is soft.     Tenderness: There is no abdominal tenderness. There is no guarding.  Musculoskeletal:        General: No swelling or tenderness.     Right lower leg: No edema.     Left lower leg: No edema.  Skin:    General: Skin is warm and dry.  Neurological:     Mental Status: He is alert and oriented to person, place, and time. Mental status is at baseline.  Psychiatric:        Mood and Affect: Mood normal.        Behavior: Behavior normal.      Assessment & Plan:   See Encounters Tab for problem based charting.  Patient discussed with Dr. Daryll Drown

## 2020-06-12 NOTE — Assessment & Plan Note (Addendum)
Patient has history of chronic back pain status post surgeries.  He states his pain on average is 5 out of 10 daily, 7/10 today.  He states he has had this pain for the past few years.  He often wakes up only after 4 to 5 hours of sleep due to his pain.  Currently takes Tylenol as needed.  He is also tried ibuprofen with little relief.  Discussed getting patient's chronic pain under control with likely help control his fatigue.  At this time discussed trying NSAIDs.  Prescribed Celebrex 200 mg daily with plans to increase up to 200 mg twice a day if there is no improvement on once daily dosing.  Discussed with patient if he does not respond to NSAIDs while try something stronger as needed.  Patient agreeable to this plan.  Plan: -Celecoxib 200 mg daily, increase up to 200 mg twice daily if not responsive to once daily dosing -Follow-up in 4 weeks if symptoms persist or worsen

## 2020-06-12 NOTE — Assessment & Plan Note (Signed)
Blood pressure 126/74 today.  Patient reports he has been taking his amlodipine losartan as prescribed.  States that sometimes when he checks it at home his systolic runs in the 010A.  Discussed continuing medications as prescribed at this time.  Plan: -Continue amlodipine 10 mg daily and losartan 50 mg daily

## 2020-06-12 NOTE — Patient Instructions (Addendum)
Jesus Martin,  It was a pleasure meeting you today. I have provided a list of everything we discussed today. Both your hypertension and diabetes are well controlled. Regarding your fatigue, I think this is multifactorial from the medications you are on, your pain and other medical conditions. The main thing we discussed today is getting your pain control. I have sent in a prescription for Celebrex 200 mg daily to your pharmacy. You can increase this to 200 mg twice a day if you do not have any pain control on 200 mg once daily. Please give me a call in 1-2 weeks to let me know how your pain control is doing.  Blood pressure: Your blood pressure looks great today. I want you to continue taking amlodipine and losartan.  Diabetes: We repeated your Hemoglobin A1c today. I want you to continue taking metformin and insulin.   Subclinical hyperthyroidism: We are going to repeat your thyroid labs today.   Thanks for allowing [place] to be a part of your care!

## 2020-06-13 LAB — T3: T3, Total: 108 ng/dL (ref 71–180)

## 2020-06-13 LAB — T4, FREE: Free T4: 1.27 ng/dL (ref 0.82–1.77)

## 2020-06-13 LAB — TSH: TSH: 0.521 u[IU]/mL (ref 0.450–4.500)

## 2020-06-16 NOTE — Progress Notes (Signed)
Internal Medicine Clinic Attending ° °Case discussed with Dr. Rehman  At the time of the visit.  We reviewed the resident’s history and exam and pertinent patient test results.  I agree with the assessment, diagnosis, and plan of care documented in the resident’s note.  ° °

## 2020-06-21 ENCOUNTER — Ambulatory Visit (HOSPITAL_BASED_OUTPATIENT_CLINIC_OR_DEPARTMENT_OTHER)
Admission: RE | Admit: 2020-06-21 | Discharge: 2020-06-21 | Disposition: A | Discharge status: Home / Self Care | Payer: Medicare Other | Source: Ambulatory Visit | Attending: Internal Medicine | Admitting: Internal Medicine

## 2020-06-21 ENCOUNTER — Other Ambulatory Visit: Payer: Self-pay

## 2020-06-21 DIAGNOSIS — R918 Other nonspecific abnormal finding of lung field: Secondary | ICD-10-CM

## 2020-06-21 DIAGNOSIS — Z720 Tobacco use: Secondary | ICD-10-CM

## 2020-06-21 DIAGNOSIS — F17210 Nicotine dependence, cigarettes, uncomplicated: Secondary | ICD-10-CM | POA: Insufficient documentation

## 2020-06-26 ENCOUNTER — Ambulatory Visit (HOSPITAL_COMMUNITY): Payer: Medicare Other

## 2020-06-27 ENCOUNTER — Other Ambulatory Visit: Payer: Self-pay | Admitting: Internal Medicine

## 2020-06-27 DIAGNOSIS — I10 Essential (primary) hypertension: Secondary | ICD-10-CM

## 2020-06-29 ENCOUNTER — Other Ambulatory Visit: Payer: Self-pay | Admitting: Student

## 2020-06-29 DIAGNOSIS — E119 Type 2 diabetes mellitus without complications: Secondary | ICD-10-CM

## 2020-07-05 ENCOUNTER — Other Ambulatory Visit: Payer: Self-pay | Admitting: Internal Medicine

## 2020-07-05 DIAGNOSIS — E119 Type 2 diabetes mellitus without complications: Secondary | ICD-10-CM

## 2020-07-09 ENCOUNTER — Other Ambulatory Visit: Payer: Self-pay

## 2020-07-09 ENCOUNTER — Encounter: Payer: Self-pay | Admitting: Internal Medicine

## 2020-07-09 ENCOUNTER — Ambulatory Visit (INDEPENDENT_AMBULATORY_CARE_PROVIDER_SITE_OTHER): Payer: Medicare Other | Admitting: Internal Medicine

## 2020-07-09 VITALS — BP 141/86 | HR 100 | Temp 98.0°F | Ht 72.0 in | Wt 269.1 lb

## 2020-07-09 DIAGNOSIS — J449 Chronic obstructive pulmonary disease, unspecified: Secondary | ICD-10-CM | POA: Diagnosis not present

## 2020-07-09 DIAGNOSIS — G8929 Other chronic pain: Secondary | ICD-10-CM | POA: Diagnosis not present

## 2020-07-09 DIAGNOSIS — R21 Rash and other nonspecific skin eruption: Secondary | ICD-10-CM | POA: Diagnosis not present

## 2020-07-09 NOTE — Patient Instructions (Signed)
[name],  It was a pleasure seeing you today. Today we discussed your chronic fatigue and pain, as well as your cough and skin rash. For your pain, I want you to continue taking Celebrex as prescribed, but trying to limit this to only on the days your pain is very severe.   For your cough continue supportive care and let me know if this gets worse.  For your rash, try switching back to your regular detergent and let me know if this does not help.   Thank you for allowing [place] to be a part of your care!

## 2020-07-09 NOTE — Progress Notes (Signed)
   CC: Pain  HPI:  Mr.Chibuikem W Nehme is a 59 y.o. with a PMHx listed below presenting for follow up of his pain. For details of today's visit and the status of his chronic medical issues please refer to the assessment and plan.   Past Medical History:  Diagnosis Date  . Beta-hemolytic group A streptococcal sepsis ([place])   . Bipolar I disorder (HCC) 1991   Dr. Toy Care  . Borderline hyperlipidemia    history of  . Childhood asthma   . COPD (chronic obstructive pulmonary disease) ([place])   . Diabetes mellitus, new onset ([place]) 12/22/2017  . Elevated glucose   . Fibromyalgia   . GERD (gastroesophageal reflux disease)   . HTN (hypertension)   . Low vitamin B12 level 06/07/2018  . Lower back pain   . Lumbar vertebral fracture (HCC) 01/29/2019   L1  . Paranoia ([place])   . Tobacco abuse   . Tobacco abuse   . Vitamin D deficiency    Review of Systems:   Review of Systems  Constitutional: Positive for malaise/fatigue. Negative for chills and fever.  Gastrointestinal: Negative for abdominal pain, constipation, diarrhea, nausea and vomiting.  Musculoskeletal: Positive for back pain, joint pain and myalgias. Negative for falls.     Physical Exam:  There were no vitals filed for this visit.  Physical Exam Vitals reviewed.  Constitutional:      Appearance: Normal appearance.  Cardiovascular:     Rate and Rhythm: Normal rate and regular rhythm.     Pulses: Normal pulses.     Heart sounds: Normal heart sounds. No murmur heard.  No friction rub. No gallop.   Pulmonary:     Effort: Pulmonary effort is normal. No respiratory distress.     Breath sounds: Normal breath sounds. No wheezing or rales.  Abdominal:     General: Abdomen is flat. Bowel sounds are normal. There is no distension.     Palpations: Abdomen is soft.     Tenderness: There is no abdominal tenderness.  Musculoskeletal:     Right lower leg: No edema.  Skin:    General: Skin is warm and dry.     Findings: Erythema  and rash present.     Comments: Facial rash with erythema spanning lower half of patients face, left greater than right  Neurological:     General: No focal deficit present.     Mental Status: He is alert and oriented to person, place, and time.  Psychiatric:        Mood and Affect: Mood normal.        Behavior: Behavior normal.        Thought Content: Thought content normal.        Judgment: Judgment normal.     Assessment & Plan:   See Encounters Tab for problem based charting.  Patient discussed with  Dr. Evette Doffing.

## 2020-07-10 DIAGNOSIS — R21 Rash and other nonspecific skin eruption: Secondary | ICD-10-CM | POA: Insufficient documentation

## 2020-07-10 NOTE — Assessment & Plan Note (Signed)
Patient reports a facial rash which has been going on for the past few weeks.  He noticed redness and a few bumps on the lower half of his face which have now progressed and are surrounding his left eye.  He denies any itching or burning.  Denies any vision changes.  No other rashes.  No changes in his medications or diet.  Patient's wife does report she recently changed their detergent.  Discussed with patient and his wife to stop using the new detergent and returning to previous detergent to see if this helps.  In the meantime continue to monitor.

## 2020-07-10 NOTE — Assessment & Plan Note (Signed)
Patient reports that he has been having a somewhat productive cough for the past 3 weeks.  He denies any fevers, shortness of breath, recent sick contacts, headaches, sinus pain.  He states that he does feel somewhat congested and has been using Alka-Seltzer for this with some help.  Discussed with the patient he can try also taking Mucinex to help loosen up the mucus.  This time there is no concern that this is a COPD exacerbation.  Patient told to let me know if this persists or if his symptoms worsen.  Plan: -Mucinex for congestion -Continue to monitor

## 2020-07-10 NOTE — Assessment & Plan Note (Addendum)
Patient presents today for follow-up evaluation of his chronic pain. He states the celecoxib has helped his pain some.  Patient reports that he has been taking 200 mg in the morning and sometimes a dose in the evening as well.  Discussed with patient the importance of taking this medication as needed when his pain is most severe.  He states without taking his pain medications he is unable to complete his ADLs.  Discussed with patient the risk of NSAID use daily, most significantly GI ulcers and risk of worsening his kidney function.  Patient expressed understanding.  There is concern that he may continue to use this medication daily, may need to reevaluate this at next visit.  Discussed with patient that he is very limited with options for pain management.  Also stressed the importance of understanding that his pain will likely not go away but the goal is to allow him to continue to function and complete his ADLs.  Patient expressed understanding and states he understands the risks of continuing to take NSAIDs.  We also discussed that the only other option at this point is using a stronger opiate medication.  Patient is not interested in this at this time.  He states his daughter is an addict and he likes to avoid having any controlled substances in the house.  We also discussed the possibility of using a medication called duloxetine for his chronic pain.  However given how many medications he is on for his bipolar disorder managed by Dr. Toy Care I would like to have her involvement and opinion in starting this medication if needed.  Plan: -Continue celecoxib 200 mg daily as needed for severe pain, max dose 100 mg daily. -Counseled patient on the importance of using NSAIDs on an as-needed basis -At follow-up visit may need to consider other options depending on how frequently patient is continuing to use NSAIDs -Check BMP at follow-up visit

## 2020-07-11 NOTE — Progress Notes (Signed)
Internal Medicine Clinic Attending ° °Case discussed with Dr. Rehman  At the time of the visit.  We reviewed the resident’s history and exam and pertinent patient test results.  I agree with the assessment, diagnosis, and plan of care documented in the resident’s note.  ° °

## 2020-07-15 ENCOUNTER — Telehealth: Payer: Self-pay

## 2020-07-15 ENCOUNTER — Other Ambulatory Visit: Payer: Self-pay

## 2020-07-15 NOTE — Telephone Encounter (Signed)
Pt requested a refill of traMADol (ULTRAM) 50 MG tablet sent to   [facility], [address] Phone:  [phone]  Fax:  [phone]

## 2020-07-15 NOTE — Telephone Encounter (Signed)
Tramadol RX sent on 06/06/20 w/ 3 refills. Shannon (EC) called back and instructed to request refill from pharmacy. SChaplin, RN,BSN

## 2020-07-15 NOTE — Telephone Encounter (Signed)
error 

## 2020-08-16 ENCOUNTER — Other Ambulatory Visit: Payer: Medicare Other | Admitting: Orthotics

## 2020-08-22 ENCOUNTER — Ambulatory Visit (INDEPENDENT_AMBULATORY_CARE_PROVIDER_SITE_OTHER): Payer: Medicare HMO | Admitting: Internal Medicine

## 2020-08-22 ENCOUNTER — Encounter: Payer: Self-pay | Admitting: Internal Medicine

## 2020-08-22 ENCOUNTER — Other Ambulatory Visit: Payer: Self-pay

## 2020-08-22 VITALS — BP 147/86 | HR 102 | Temp 97.5°F | Ht 72.0 in | Wt 264.2 lb

## 2020-08-22 DIAGNOSIS — I10 Essential (primary) hypertension: Secondary | ICD-10-CM | POA: Diagnosis not present

## 2020-08-22 DIAGNOSIS — E119 Type 2 diabetes mellitus without complications: Secondary | ICD-10-CM | POA: Diagnosis not present

## 2020-08-22 DIAGNOSIS — R5382 Chronic fatigue, unspecified: Secondary | ICD-10-CM | POA: Diagnosis not present

## 2020-08-22 DIAGNOSIS — G8929 Other chronic pain: Secondary | ICD-10-CM | POA: Diagnosis not present

## 2020-08-22 DIAGNOSIS — S32010G Wedge compression fracture of first lumbar vertebra, subsequent encounter for fracture with delayed healing: Secondary | ICD-10-CM

## 2020-08-22 MED ORDER — ACETAMINOPHEN 500 MG PO TABS: 1000.0000 mg | ORAL_TABLET | Freq: Two times a day (BID) | ORAL | 2 refills | Status: AC | PRN

## 2020-08-22 NOTE — Patient Instructions (Addendum)
Thank you, [name] for allowing [place] to provide your care today. Today we discussed chronic pain, diabetes, blood pressure.    I have ordered the following labs for you:   Lab Orders     BMP8+Anion Gap     Magnesium   Tests ordered today:  None  Referrals ordered today:    Referral Orders     Ambulatory referral to Ophthalmology   I would also recommend scheduling another appointment with your Neurosurgeon to see if you would benefit form steroid injections versus further procedures.   I have ordered the following medication/changed the following medications:   Stop the following medications:  Start the following medications: Meds ordered this encounter  Medications  . acetaminophen (TYLENOL) 500 MG tablet    Sig: Take 2 tablets (1,000 mg total) by mouth 2 (two) times daily as needed for moderate pain.    Dispense:  60 tablet    Refill:  2    Please Use topical Voltaren Gel and Lidocaine patches directly on you back daily Try using warm compresses to help with the pain Continue to stay mobile  Follow up: with your PCP in 2-3 months   Should you have any questions or concerns please call the internal medicine clinic at [phone].     Jesus Martin, D.O. [place]

## 2020-08-22 NOTE — Progress Notes (Signed)
CC: Back pain  HPI:  Jesus Martin is a 60 y.o. male with a past medical history stated below and presents today for back pain. Please see problem based assessment and plan for additional details.  Past Medical History:  Diagnosis Date  . Beta-hemolytic group A streptococcal sepsis ([place])   . Bipolar I disorder (HCC) 1991   Dr. Toy Care  . Borderline hyperlipidemia    history of  . Childhood asthma   . COPD (chronic obstructive pulmonary disease) ([place])   . Diabetes mellitus, new onset ([place]) 12/22/2017  . Elevated glucose   . Fibromyalgia   . GERD (gastroesophageal reflux disease)   . HTN (hypertension)   . Low vitamin B12 level 06/07/2018  . Lower back pain   . Lumbar vertebral fracture (HCC) 01/29/2019   L1  . Paranoia ([place])   . Tobacco abuse   . Tobacco abuse   . Vitamin D deficiency     Current Outpatient Medications on File Prior to Visit  Medication Sig Dispense Refill  . amLODipine (NORVASC) 10 MG tablet Take 1 tablet by mouth once daily 90 tablet 0  . ACCU-CHEK FASTCLIX LANCETS MISC Check blood sugar 3 times a day. 102 each 11  . alprazolam (XANAX) 2 MG tablet Take 2 mg by mouth at bedtime as needed for sleep or anxiety.     Marland Kitchen atorvastatin (LIPITOR) 40 MG tablet Take 1 tablet (40 mg total) by mouth daily at 6 PM. 90 tablet 3  . BD VEO INSULIN SYRINGE U/F 31G X 15/64" 0.3 ML MISC USE TO INJECT INSULIN TWICE DAILY 100 each 0  . blood glucose meter kit and supplies KIT Dispense based on patient and insurance preference. Use up to four times daily as directed. (FOR ICD-9 250.00, 250.01). 1 each 0  . Blood Glucose Monitoring Suppl (ACCU-CHEK GUIDE) w/Device KIT 1 each by Does not apply route 3 (three) times daily. 1 kit 1  . calcitonin, salmon, (MIACALCIN/FORTICAL) 200 UNIT/ACT nasal spray Place 1 spray into alternate nostrils daily. 3.7 mL 0  . Capsaicin 0.025 % PADS Apply 1 application topically 3 (three) times daily as needed. 20 each 0  . capsicum (ZOSTRIX) 0.075 %  topical cream Apply 1 application topically 3 (three) times daily as needed. 114 g 0  . celecoxib (CELEBREX) 200 MG capsule Take 1 capsule (200 mg total) by mouth daily. 30 capsule 2  . diclofenac sodium (VOLTAREN) 1 % GEL Apply 2 g topically 4 (four) times daily. 350 g 6  . diclofenac Sodium (VOLTAREN) 1 % GEL Apply 2 g topically 4 (four) times daily. Rub into affected area of foot 2 to 4 times daily 100 g 2  . diphenhydrAMINE (BENADRYL) 25 mg capsule Take 25 mg by mouth every 8 (eight) hours as needed for allergies.    Marland Kitchen EPINEPHrine (EPIPEN 2-PAK) 0.3 mg/0.3 mL IJ SOAJ injection Inject 0.3 mg into the muscle once as needed (for anaphylactic reaction to bee stings).    . Fluticasone-Umeclidin-Vilant (TRELEGY ELLIPTA) 100-62.5-25 MCG/INH AEPB INHALE 1 PUFF ONCE DAILY 60 each 2  . furosemide (LASIX) 40 MG tablet Take 1 tablet (40 mg total) by mouth daily for 7 days. 7 tablet 0  . glucose blood (ACCU-CHEK GUIDE) test strip Check blood sugar 3 times a day. 100 each 12  . insulin lispro protamine-lispro (HUMALOG MIX 75/25) (75-25) 100 UNIT/ML SUSP injection Inject 15 Units into the skin 2 (two) times daily with a meal. 30 mL 1  . ipratropium-albuterol (DUONEB) 0.5-2.5 (  3) MG/3ML SOLN Take 3 mLs by nebulization every 4 (four) hours as needed. 360 mL 1  . lamoTRIgine (LAMICTAL) 150 MG tablet Take 150 mg by mouth 2 (two) times daily.    Marland Kitchen lithium carbonate (ESKALITH) 450 MG CR tablet Take 450-675 mg by mouth See admin instructions. 675 mg by mouth in the morning and 450 mg at bedtime    . losartan (COZAAR) 25 MG tablet Take 2 tablets by mouth once daily 180 tablet 0  . metFORMIN (GLUCOPHAGE) 1000 MG tablet TAKE 1 TABLET BY MOUTH TWICE DAILY WITH A MEAL 180 tablet 0  . multivitamin (ONE-A-DAY MEN'S) TABS tablet Take 1 tablet by mouth daily.    Marland Kitchen omeprazole (PRILOSEC) 40 MG capsule Take 1 capsule by mouth once daily 90 capsule 0  . pregabalin (LYRICA) 150 MG capsule Take 1 capsule (150 mg total) by mouth 2  (two) times daily. 60 capsule 5  . QUEtiapine (SEROQUEL) 50 MG tablet Take 225 mg by mouth at bedtime.    . traMADol (ULTRAM) 50 MG tablet Take 1 tablet (50 mg total) by mouth 2 (two) times daily as needed. 60 tablet 3  . valACYclovir (VALTREX) 1000 MG tablet Take 1 tablet (1,000 mg total) by mouth 3 (three) times daily. 21 tablet 0  . VENTOLIN HFA 108 (90 Base) MCG/ACT inhaler Inhale 2 puffs into the lungs every 6 (six) hours as needed for wheezing or shortness of breath. 1 each 0   No current facility-administered medications on file prior to visit.    Family History  Adopted: Yes    Social History   Socioeconomic History  . Marital status: Married    Spouse name: Not on file  . Number of children: Not on file  . Years of education: Not on file  . Highest education level: Not on file  Occupational History  . Not on file  Tobacco Use  . Smoking status: Current Every Day Smoker    Packs/day: 0.50    Years: 45.00    Pack years: 22.50    Types: Cigarettes    Last attempt to quit: 07/10/2017    Years since quitting: 3.1  . Smokeless tobacco: Never Used  Vaping Use  . Vaping Use: Never used  Substance and Sexual Activity  . Alcohol use: Yes    Comment: Beer sometimes.  . Drug use: No  . Sexual activity: Not on file  Other Topics Concern  . Not on file  Social History Narrative   Lives with wife Jesus Martin (married 60 years) and son. Has 4 dogs and fish. Not employed. Woodworking for fun. HS graduate.       Quit smoking in 2008. 26 pack years. No recreational drug use. Drinks 1 glass of wine per month. Does not exercise.    Social Determinants of Health   Financial Resource Strain: Not on file  Food Insecurity: Not on file  Transportation Needs: Not on file  Physical Activity: Not on file  Stress: Not on file  Social Connections: Not on file  Intimate Partner Violence: Not on file    Review of Systems: ROS negative except for what is noted on the assessment  and plan.  Vitals:   08/22/20 0953 08/22/20 0957  BP: (!) 151/87 (!) 147/86  Pulse: (!) 102   Temp: (!) 97.5 F (36.4 C)   TempSrc: Oral   SpO2: 98%   Weight: 264 lb 3.2 oz (119.8 kg)   Height: 6' (1.829 m)  Physical Exam: Physical Exam Constitutional:      Appearance: Normal appearance.  HENT:     Head: Normocephalic and atraumatic.  Eyes:     Extraocular Movements: Extraocular movements intact.  Cardiovascular:     Rate and Rhythm: Normal rate.     Pulses: Normal pulses.     Heart sounds: Normal heart sounds.  Pulmonary:     Effort: Pulmonary effort is normal.     Breath sounds: Normal breath sounds.  Abdominal:     General: Bowel sounds are normal.     Palpations: Abdomen is soft.     Tenderness: There is no abdominal tenderness.  Musculoskeletal:        General: Normal range of motion.     Cervical back: Normal range of motion.     Right lower leg: No edema.     Left lower leg: No edema.  Skin:    General: Skin is warm and dry.  Neurological:     Mental Status: He is alert and oriented to person, place, and time. Mental status is at baseline.  Psychiatric:        Mood and Affect: Mood normal.      Assessment & Plan:   See Encounters Tab for problem based charting.  Patient discussed with Dr. Eulogio Ditch, D.O. [facility], PGY-2 Pager: [phone], Phone: [phone] Date 08/23/2020 Time 2:36 PM

## 2020-08-23 ENCOUNTER — Encounter: Payer: Self-pay | Admitting: Internal Medicine

## 2020-08-23 LAB — BMP8+ANION GAP
Anion Gap: 18 mmol/L (ref 10.0–18.0)
BUN/Creatinine Ratio: 13 (ref 9–20)
BUN: 13 mg/dL (ref 6–24)
CO2: 24 mmol/L (ref 20–29)
Calcium: 10.2 mg/dL (ref 8.7–10.2)
Chloride: 100 mmol/L (ref 96–106)
Creatinine, Ser: 0.99 mg/dL (ref 0.76–1.27)
GFR calc Af Amer: 97 mL/min/{1.73_m2} (ref >59)
GFR calc non Af Amer: 84 mL/min/{1.73_m2} (ref >59)
Glucose: 151 mg/dL — ABNORMAL HIGH (ref 65–99)
Potassium: 4.3 mmol/L (ref 3.5–5.2)
Sodium: 142 mmol/L (ref 134–144)

## 2020-08-23 LAB — MAGNESIUM: Magnesium: 1.7 mg/dL (ref 1.6–2.3)

## 2020-08-23 NOTE — Assessment & Plan Note (Signed)
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  Patient has been on Celebrex for the last 3 months with minimal improvement. Patient states that he is in considerable pain and believes this is limiting his functionality.  Patient's baseline functionality consists of being able to walk without stopping from the front of the hospital to our clinic.  He states that he does have to sit frequently at home in between doing chores due to back pain.  Patient's get up and go time is well within normal limits.    I counseled him on the importance of stopping long-term NSAID use due to nephrotoxic complications.  I spent the majority of my time in this appointment educating the patient regarding the nature of chronic back pain and reassuring him that he will have periods of improved pain combined with periods of worsening pain  I emphasized the importance of staying functional and moving as much as possible to improve his mobility.  I will prescribe him Tylenol 1000 mg twice daily and discussed staying active and using heating pads to improve his pain.  Patient was visibly aggravated that he was not being prescribed medication to help with his pain at this appointment.  I discussed referring him back to his neurosurgeon for possible of spinal injections to improve his pain.  Patient admits understanding and states he will call his neurosurgeon to schedule a follow-up appointment.

## 2020-08-23 NOTE — Assessment & Plan Note (Signed)
Patient presents for follow-up for his hypertension.  His blood pressure today is 147/86 on amlodipine 10 mg, losartan 25 mg, furosemide 40 mg.  He does not take his blood pressure at home.  His wife manages the majority of his prescriptions.  She has present the room but is unsure which medication he is taking.  Plan: -Patient will need titration of his antihypertensive medications as he is no longer at goal. -Asked the patient's wife to call us back with the correct list of medications.

## 2020-08-23 NOTE — Assessment & Plan Note (Signed)
Patient presents for follow-up for diabetes he is currently on metformin 1000 mg and lispro 15 units twice daily with meals.  Patient does check his blood sugars at home and states that he stays below 200.  His most recent A1c 2 months ago 6.4.  Plan: -I will refer him to an ophthalmologist for diabetic eye exam -Continue current diabetic management.

## 2020-08-27 NOTE — Progress Notes (Signed)
Internal Medicine Clinic Attending  Case discussed with Dr. Marianna Payment at the time of the visit.  We reviewed the resident's history and exam and pertinent patient test results.  I agree with the assessment, diagnosis, and plan of care documented in the resident's note.  Lenice Pressman, M.D., Ph.D.

## 2020-09-01 ENCOUNTER — Other Ambulatory Visit: Payer: Self-pay | Admitting: Student

## 2020-09-03 ENCOUNTER — Other Ambulatory Visit: Payer: Self-pay | Admitting: Internal Medicine

## 2020-09-03 DIAGNOSIS — E119 Type 2 diabetes mellitus without complications: Secondary | ICD-10-CM

## 2020-09-05 ENCOUNTER — Other Ambulatory Visit: Payer: Self-pay | Admitting: Internal Medicine

## 2020-09-05 DIAGNOSIS — I10 Essential (primary) hypertension: Secondary | ICD-10-CM

## 2020-09-06 ENCOUNTER — Other Ambulatory Visit: Payer: Self-pay | Admitting: Student

## 2020-09-06 DIAGNOSIS — I10 Essential (primary) hypertension: Secondary | ICD-10-CM

## 2020-09-06 NOTE — Telephone Encounter (Signed)
Amlodipine RX was denied yesterday, however, this RX was sent to Cardiology, not [facility].  Will submit to [facility] for refill.  Also, patient's wife states that there was a change in insurance and patient has gone back to [place] from [facility]  [employer] will not pay for the Insulin Lispro.  Wife states 2 years ago, patient had [employer] and was on a different insulin which they paid for.  Asking for Insulin to be switched out again. Will forward to red team. Laurence Compton, RN,BSN

## 2020-09-06 NOTE — Telephone Encounter (Signed)
Pt's spouse Larene Beach requesting a call back about the following medications:   amLODipine (NORVASC) 10 MG tablet  Per Larene Beach the [employer] will not pay for the medication below. Larene Beach states the pharmacy is requesting a different insulin to be called in.  insulin lispro protamine-lispro (HUMALOG MIX 75/25) (75-25) 100 UNIT/ML SUSP injection   [place], [place] - [address] (Ph: [phone])

## 2020-09-09 ENCOUNTER — Other Ambulatory Visit: Payer: Self-pay | Admitting: Internal Medicine

## 2020-09-09 DIAGNOSIS — I10 Essential (primary) hypertension: Secondary | ICD-10-CM

## 2020-09-09 MED ORDER — AMLODIPINE BESYLATE 10 MG PO TABS: 10.0000 mg | ORAL_TABLET | Freq: Every day | ORAL | 0 refills | Status: DC

## 2020-09-09 NOTE — Telephone Encounter (Signed)
Does he use pens or vials?

## 2020-09-09 NOTE — Telephone Encounter (Signed)
Which insulin was he on 2 years ago?

## 2020-09-09 NOTE — Telephone Encounter (Signed)
I suggest trying Novolog Flexpen. Pharmacy says their message shows preferred product is Novolog

## 2020-09-09 NOTE — Telephone Encounter (Signed)
I just looked through his chart form 2 years ago and he was on the same insulin that he is now. We will need to call the pharmacy and see which insulin he is able to get instead.

## 2020-09-12 NOTE — Telephone Encounter (Signed)
I don't think this was addressed.  Forwarding to red team, please see below. Thank you!

## 2020-09-13 ENCOUNTER — Other Ambulatory Visit: Payer: Self-pay | Admitting: Student

## 2020-09-13 MED ORDER — INSULIN ASPART 100 UNIT/ML ~~LOC~~ SOLN: SUBCUTANEOUS | 3 refills | Status: DC

## 2020-09-13 NOTE — Telephone Encounter (Signed)
Patient's wife called in stating patient is now completely out of insulin. States patient has never used a pen before. Requesting Novolog vials be sent today to  [facility] on [address]. Hubbard Hartshorn, BSN, RN-BC

## 2020-09-13 NOTE — Telephone Encounter (Signed)
Sent prescription for novolog 10 units TID

## 2020-09-20 ENCOUNTER — Other Ambulatory Visit: Payer: Self-pay | Admitting: Internal Medicine

## 2020-09-20 DIAGNOSIS — E119 Type 2 diabetes mellitus without complications: Secondary | ICD-10-CM

## 2020-10-02 ENCOUNTER — Other Ambulatory Visit: Payer: Self-pay | Admitting: Student

## 2020-10-02 MED ORDER — INSULIN ASPART 100 UNIT/ML ~~LOC~~ SOLN: SUBCUTANEOUS | 3 refills | Status: DC

## 2020-10-02 NOTE — Telephone Encounter (Signed)
Received TC from patient's wife, Larene Beach, who states she has received a letter from [employer] that they will not pay for the novolog.  She states the letter only states to contact her MD and does not list any alternatives.  Please see earlier messages below regarding change from Lispro to Novolog.  Forwarding to pharmacy team to reach out to patient's wife and help clarify. Thank you! SChaplin, RN,BSN

## 2020-10-02 NOTE — Telephone Encounter (Signed)
Resent prescription of novolog

## 2020-10-04 ENCOUNTER — Telehealth: Payer: Self-pay

## 2020-10-04 ENCOUNTER — Other Ambulatory Visit: Payer: Self-pay | Admitting: Internal Medicine

## 2020-10-04 DIAGNOSIS — E119 Type 2 diabetes mellitus without complications: Secondary | ICD-10-CM

## 2020-10-04 NOTE — Telephone Encounter (Signed)
Followed up on Novolog prescription at [facility] on [address]. Spoke to patients wife and let her know the prescription was filled and the price was $9.85 for a vial.

## 2020-10-09 ENCOUNTER — Other Ambulatory Visit: Payer: Self-pay | Admitting: Internal Medicine

## 2020-10-09 DIAGNOSIS — I10 Essential (primary) hypertension: Secondary | ICD-10-CM

## 2020-10-23 ENCOUNTER — Other Ambulatory Visit: Payer: Self-pay | Admitting: Internal Medicine

## 2020-10-23 DIAGNOSIS — E119 Type 2 diabetes mellitus without complications: Secondary | ICD-10-CM

## 2020-10-30 ENCOUNTER — Other Ambulatory Visit: Payer: Self-pay | Admitting: Internal Medicine

## 2020-11-01 ENCOUNTER — Other Ambulatory Visit: Payer: Self-pay | Admitting: Student

## 2020-11-01 DIAGNOSIS — E119 Type 2 diabetes mellitus without complications: Secondary | ICD-10-CM

## 2020-11-05 ENCOUNTER — Other Ambulatory Visit: Payer: Self-pay | Admitting: Internal Medicine

## 2020-11-05 DIAGNOSIS — G8929 Other chronic pain: Secondary | ICD-10-CM

## 2020-11-07 ENCOUNTER — Other Ambulatory Visit: Payer: Self-pay | Admitting: Student

## 2020-11-07 DIAGNOSIS — E119 Type 2 diabetes mellitus without complications: Secondary | ICD-10-CM

## 2020-11-18 ENCOUNTER — Ambulatory Visit (INDEPENDENT_AMBULATORY_CARE_PROVIDER_SITE_OTHER): Payer: Medicare HMO | Admitting: Internal Medicine

## 2020-11-18 ENCOUNTER — Other Ambulatory Visit: Payer: Self-pay

## 2020-11-18 VITALS — BP 116/92 | HR 105 | Temp 97.8°F | Ht 73.0 in | Wt 265.6 lb

## 2020-11-18 DIAGNOSIS — E119 Type 2 diabetes mellitus without complications: Secondary | ICD-10-CM | POA: Diagnosis not present

## 2020-11-18 DIAGNOSIS — R079 Chest pain, unspecified: Secondary | ICD-10-CM

## 2020-11-18 DIAGNOSIS — S32010G Wedge compression fracture of first lumbar vertebra, subsequent encounter for fracture with delayed healing: Secondary | ICD-10-CM

## 2020-11-18 LAB — POCT GLYCOSYLATED HEMOGLOBIN (HGB A1C): Hemoglobin A1C: 6.5 % — AB (ref 4.0–5.6)

## 2020-11-18 LAB — GLUCOSE, CAPILLARY: Glucose-Capillary: 148 mg/dL — ABNORMAL HIGH (ref 70–99)

## 2020-11-18 NOTE — Assessment & Plan Note (Signed)
Patient continues to have chronic pain of his lower back secondary to spinal compression fracture from several years ago.  He has tried 2 different forms of NSAIDs with minimal improvement.  He is still able to complete his ADLs which range from cooking help around the house and yard work.  However he states the pain limits his functionality with walking and how much she is able to do every day.  He feels very fatigued by the end of the day most likely due to the pain.  He is currently taking Tylenol 1000 mg about 4 times daily.  Discussed using NSAIDs and high doses of Tylenol with severe pain episodes only.  At this point due to failed trials of NSAIDs he is agreeable to seeing neurosurgery for possible spinal injections or further evaluation.  Plan: Recommend that he continue Tylenol 1000 mg twice daily only for severe pain Recommended staying active and exercising to strengthen his core Referral to neurosurgery for possible spinal steroid injection

## 2020-11-18 NOTE — Progress Notes (Signed)
   CC: Pain  HPI:  Jesus Martin is a 60 y.o. with a past medical history listed below presenting for pain. For details of today's visit and the status of his chronic medical issues please refer to the assessment and plan.   Past Medical History:  Diagnosis Date  . Beta-hemolytic group A streptococcal sepsis ([place])   . Bipolar I disorder (HCC) 1991   Dr. Toy Care  . Borderline hyperlipidemia    history of  . Childhood asthma   . COPD (chronic obstructive pulmonary disease) ([place])   . Diabetes mellitus, new onset ([place]) 12/22/2017  . Elevated glucose   . Fibromyalgia   . GERD (gastroesophageal reflux disease)   . HTN (hypertension)   . Low vitamin B12 level 06/07/2018  . Lower back pain   . Lumbar vertebral fracture (HCC) 01/29/2019   L1  . Paranoia ([place])   . Tobacco abuse   . Tobacco abuse   . Vitamin D deficiency    Review of Systems:   Review of Systems  Constitutional: Positive for malaise/fatigue. Negative for chills, diaphoresis and fever.  Respiratory: Negative for shortness of breath.   Cardiovascular: Positive for chest pain. Negative for palpitations and leg swelling.  Gastrointestinal: Negative for abdominal pain, nausea and vomiting.  Musculoskeletal: Positive for back pain, joint pain and myalgias.  Neurological: Negative for dizziness, weakness and headaches.     Physical Exam:  Vitals:   11/18/20 1331  BP: (!) 116/92  Pulse: (!) 105  Temp: 97.8 F (36.6 C)  TempSrc: Oral  SpO2: 96%  Weight: 265 lb 9.6 oz (120.5 kg)  Height: 6' 1"  (1.854 m)   Physical Exam Constitutional:      General: He is not in acute distress.    Appearance: Normal appearance. He is not ill-appearing.  Cardiovascular:     Rate and Rhythm: Normal rate and regular rhythm.     Pulses: Normal pulses.     Heart sounds: Normal heart sounds. No murmur heard. No friction rub. No gallop.   Pulmonary:     Effort: Pulmonary effort is normal. No respiratory distress.     Breath  sounds: Normal breath sounds. No wheezing or rales.  Abdominal:     General: Abdomen is flat. Bowel sounds are normal. There is no distension.     Palpations: Abdomen is soft.     Tenderness: There is no abdominal tenderness. There is no guarding.  Musculoskeletal:        General: No swelling or tenderness.     Right lower leg: No edema.     Left lower leg: No edema.  Skin:    General: Skin is warm and dry.  Neurological:     Mental Status: He is alert and oriented to person, place, and time.  Psychiatric:        Mood and Affect: Mood normal.        Behavior: Behavior normal.        Thought Content: Thought content normal.        Judgment: Judgment normal.     Assessment & Plan:   See Encounters Tab for problem based charting.  Patient discussed with Dr. Dareen Piano

## 2020-11-18 NOTE — Patient Instructions (Signed)
Thank you for allowing [place] to provide your care today. Today we discussed your diabetes, back pain and chest pain.  I have ordered labs for you. I will call if any are abnormal.    Today we made no changes to your medications.    For your back pain, I put in a referral to neurosurgery.   If your chest pain persists or worsens please give [place] a a call.  Should you have any questions or concerns please call the internal medicine clinic at [phone].

## 2020-11-18 NOTE — Assessment & Plan Note (Signed)
Patient reports atypical left-sided chest pain going on intermittently for the past 2 months.  He reports episodes only at rest which resolve within 30 seconds to 1 minute.  He describes the pain as sharp and nonradiating.  Denies any shortness of breath diaphoresis pain in his jaw or arms.  States that he recently started using a chainsaw outside.  Question of whether or not this exertion is contributing to some sort of musculoskeletal related injury or sprain.  Recommended that he continue to observe this pain and if it persists or worsens that this may need further work-up.

## 2020-11-18 NOTE — Assessment & Plan Note (Signed)
Hemoglobin A1c checked today, 6.5.  Stable from January, was around 6.4 then.  Recommended continuing metformin 1000 mg and lispro 10 units with meals.

## 2020-11-23 ENCOUNTER — Other Ambulatory Visit: Payer: Self-pay | Admitting: Internal Medicine

## 2020-11-25 NOTE — Progress Notes (Signed)
Internal Medicine Clinic Attending ° °Case discussed with Dr. Rehman  At the time of the visit.  We reviewed the resident’s history and exam and pertinent patient test results.  I agree with the assessment, diagnosis, and plan of care documented in the resident’s note.  ° °

## 2020-11-26 ENCOUNTER — Encounter: Payer: Self-pay | Admitting: *Deleted

## 2020-11-26 NOTE — Progress Notes (Unsigned)

## 2020-11-29 ENCOUNTER — Encounter: Payer: Self-pay | Admitting: Student

## 2020-11-29 NOTE — Progress Notes (Unsigned)
Things That May Be Affecting Your Health:  Alcohol  Hearing loss X Pain    Depression  Home Safety  Sexual Health   Diabetes  Lack of physical activity  Stress  X Difficulty with daily activities  Loneliness  Tiredness   Drug use X Medicines X Tobacco use   Falls  Motor Vehicle Safety  Weight   Food choices  Oral Health  Other    YOUR PERSONALIZED HEALTH PLAN : 1. Schedule your next subsequent Medicare Wellness visit in one year 2. Attend all of your regular appointments to address your medical issues 3. Complete the preventative screenings and services   Annual Wellness Visit   Medicare Covered Preventative Screenings and [place] Men and Women Who How Often Need? Date of Last Service Action  Abdominal Aortic Aneurysm Adults with AAA risk factors Once      Alcohol Misuse and Counseling All Adults Screening once a year if no alcohol misuse. Counseling up to 4 face to face sessions.     Bone Density Measurement  Adults at risk for osteoporosis Once every 2 yrs      Lipid Panel Z13.6 All adults without CV disease Once every 5 yrs Yes  Last lipid panel was 2 years ago, but was normal    Colorectal Cancer   Stool sample or  Colonoscopy All adults 51 and older   Once every year  Every 10 years        Depression All Adults Once a year Yes Today   Diabetes Screening Blood glucose, post glucose load, or GTT Z13.1  All adults at risk  Pre-diabetics  Once per year  Twice per year      Diabetes  Self-Management Training All adults Diabetics 10 hrs first year; 2 hours subsequent years. Requires Copay Yes A1C of 6.5 pretty well-controlled.   Glaucoma  Diabetics  Family history of glaucoma  African Americans 54 yrs +  Hispanic Americans 42 yrs + Annually - requires coppay      Hepatitis C Z72.89 or F19.20  High Risk for HCV  Born between 1945 and 1965  Annually  Once No     HIV Z11.4 All adults based on risk  Annually btw ages 80 & 48  regardless of risk  Annually > 65 yrs if at increased risk No     Lung Cancer Screening Asymptomatic adults aged 31-77 with 13 pack yr history and current smoker OR quit within the last 15 yrs Annually Must have counseling and shared decision making documentation before first screen      Medical Nutrition Therapy Adults with   Diabetes  Renal disease  Kidney transplant within past 3 yrs 3 hours first year; 2 hours subsequent years     Obesity and Counseling All adults Screening once a year Counseling if BMI 30 or higher  Today   Tobacco Use Counseling Adults who use tobacco  Up to 8 visits in one year     Vaccines Z23  Hepatitis B  Influenza   Pneumonia  Adults   Once  Once every flu season  Two different vaccines separated by one year     Next Annual Wellness Visit People with Medicare Every year  Today     Services & Screenings Women Who How Often Need  Date of Last Service Action  Mammogram  Z12.31 Women over 20 One baseline ages 12-39. Annually ager 40 yrs+      Pap tests All women Annually if high  risk. Every 2 yrs for normal risk women      Screening for cervical cancer with   Pap (Z01.419 nl or Z01.411abnl) &  HPV Z11.51 Women aged 53 to 45 Once every 5 yrs     Screening pelvic and breast exams All women Annually if high risk. Every 2 yrs for normal risk women     Sexually Transmitted Diseases  Chlamydia  Gonorrhea  Syphilis All at risk adults Annually for non pregnant females at increased risk         [place] Men Who How Ofter Need  Date of Last Service Action  Prostate Cancer - DRE & PSA Men over 50 Annually.  DRE might require a copay. No        Sexually Transmitted Diseases  Syphilis All at risk adults Annually for men at increased risk No      Health Maintenance List Health Maintenance  Topic Date Due  . FOOT EXAM  Never done  . OPHTHALMOLOGY EXAM  12/30/2019  . COVID-19 Vaccine (3 - Booster for Pfizer series)  10/14/2020  . INFLUENZA VACCINE  03/10/2021  . HEMOGLOBIN A1C  05/20/2021  . TETANUS/TDAP  06/23/2021  . COLONOSCOPY (Pts 45-77yr Insurance coverage will need to be confirmed)  08/12/2022  . PNEUMOCOCCAL POLYSACCHARIDE VACCINE AGE 68-64 HIGH RISK  Completed  . Hepatitis C Screening  Completed  . HIV Screening  Completed  . HPV VACCINES  Aged Out

## 2020-12-02 ENCOUNTER — Other Ambulatory Visit: Payer: Self-pay | Admitting: Internal Medicine

## 2020-12-02 DIAGNOSIS — G8929 Other chronic pain: Secondary | ICD-10-CM

## 2020-12-03 DIAGNOSIS — E119 Type 2 diabetes mellitus without complications: Secondary | ICD-10-CM | POA: Diagnosis not present

## 2020-12-03 LAB — HM DIABETES EYE EXAM

## 2020-12-06 ENCOUNTER — Other Ambulatory Visit: Payer: Self-pay | Admitting: Internal Medicine

## 2020-12-06 DIAGNOSIS — G8929 Other chronic pain: Secondary | ICD-10-CM

## 2020-12-23 ENCOUNTER — Other Ambulatory Visit: Payer: Self-pay | Admitting: Internal Medicine

## 2020-12-23 NOTE — Addendum Note (Signed)
Addended by: Asencion Noble on: 12/23/2020 07:02 PM   Modules accepted: Orders

## 2020-12-24 ENCOUNTER — Other Ambulatory Visit: Payer: Self-pay | Admitting: Student

## 2020-12-24 ENCOUNTER — Other Ambulatory Visit: Payer: Self-pay | Admitting: Internal Medicine

## 2020-12-24 DIAGNOSIS — I10 Essential (primary) hypertension: Secondary | ICD-10-CM

## 2021-01-03 ENCOUNTER — Other Ambulatory Visit: Payer: Self-pay | Admitting: Internal Medicine

## 2021-01-03 DIAGNOSIS — E119 Type 2 diabetes mellitus without complications: Secondary | ICD-10-CM

## 2021-01-07 ENCOUNTER — Telehealth: Payer: Self-pay

## 2021-01-07 NOTE — Telephone Encounter (Signed)
Received TC from spouse (EC) Larene Beach, who is requesting an appointment for patient to be seen for an evaluation d/t dizziness, fatigue, and confusion.  States symptoms started about 1 month ago and symptoms are intermittent.  She is requesting a late morning appt Appt given for 01/15/21 @ 0945 with Dr. Gilford Rile. SChaplin, RN,BSN

## 2021-01-12 ENCOUNTER — Encounter: Payer: Self-pay | Admitting: *Deleted

## 2021-01-14 ENCOUNTER — Encounter: Payer: Self-pay | Admitting: *Deleted

## 2021-01-15 ENCOUNTER — Other Ambulatory Visit: Payer: Self-pay

## 2021-01-15 ENCOUNTER — Ambulatory Visit (INDEPENDENT_AMBULATORY_CARE_PROVIDER_SITE_OTHER): Payer: Medicare HMO | Admitting: Internal Medicine

## 2021-01-15 ENCOUNTER — Encounter: Payer: Self-pay | Admitting: Internal Medicine

## 2021-01-15 ENCOUNTER — Other Ambulatory Visit: Payer: Self-pay | Admitting: Internal Medicine

## 2021-01-15 ENCOUNTER — Other Ambulatory Visit: Payer: Self-pay | Admitting: Student

## 2021-01-15 VITALS — BP 165/87 | HR 101 | Temp 97.7°F | Ht 72.0 in | Wt 264.9 lb

## 2021-01-15 DIAGNOSIS — E785 Hyperlipidemia, unspecified: Secondary | ICD-10-CM

## 2021-01-15 DIAGNOSIS — G8929 Other chronic pain: Secondary | ICD-10-CM

## 2021-01-15 DIAGNOSIS — E119 Type 2 diabetes mellitus without complications: Secondary | ICD-10-CM

## 2021-01-15 DIAGNOSIS — R5383 Other fatigue: Secondary | ICD-10-CM

## 2021-01-15 DIAGNOSIS — I10 Essential (primary) hypertension: Secondary | ICD-10-CM

## 2021-01-15 NOTE — Patient Instructions (Signed)
To Mr. Hershman,   It was a pleasure meeting you today! Today we talked about your fatigue, brain fog, and weakness.   Today we will obtain some blood work to rule out common causes for fatigue, such as electrolyte abnormalities, liver changes, kidney disease, low vitamin D, or having low amounts of blood. We will call you with your results, and if they are normal we will discuss our next steps. Have a good day, and we will have you come back in a month's time for follow up.  Maudie Mercury, MD

## 2021-01-15 NOTE — Progress Notes (Signed)
   CC: Fatigue  HPI:  [name] is a 60 y.o. M/F, with a PMH noted below, who presents to the clinic Fatigue. To see the management of their acute and chronic conditions, please see the A&P note under the Encounters tab.   Past Medical History:  Diagnosis Date   Beta-hemolytic group A streptococcal sepsis ([place])    Bipolar I disorder (HCC) 1991   Dr. Toy Care   Borderline hyperlipidemia    history of   Childhood asthma    COPD (chronic obstructive pulmonary disease) ([place])    Diabetes mellitus, new onset ([place]) 12/22/2017   Elevated glucose    Fibromyalgia    GERD (gastroesophageal reflux disease)    HTN (hypertension)    Low vitamin B12 level 06/07/2018   Lower back pain    Lumbar vertebral fracture (HCC) 01/29/2019   L1   Paranoia ([place])    Tobacco abuse    Tobacco abuse    Vitamin D deficiency    Review of Systems:   Review of Systems  Constitutional:  Positive for malaise/fatigue. Negative for chills, fever and weight loss.  Respiratory:  Negative for cough, hemoptysis, shortness of breath and wheezing.   Cardiovascular:  Negative for chest pain, palpitations, orthopnea, claudication and leg swelling.  Gastrointestinal:  Negative for abdominal pain, blood in stool, constipation, diarrhea, nausea and vomiting.  Skin:  Negative for itching and rash.  Neurological:  Negative for dizziness, tingling, tremors, sensory change, focal weakness, weakness and headaches.    Physical Exam:  Vitals:   01/15/21 1001  BP: (!) 165/87  Pulse: (!) 101  Temp: 97.7 F (36.5 C)  TempSrc: Oral  SpO2: 98%  Weight: 264 lb 14.4 oz (120.2 kg)  Height: 6' (1.829 m)   Physical Exam Constitutional:      General: He is not in acute distress.    Appearance: Normal appearance. He is not ill-appearing or toxic-appearing.  HENT:     Head: Normocephalic and atraumatic.  Cardiovascular:     Rate and Rhythm: Regular rhythm. Tachycardia present.     Pulses: Normal pulses.     Heart  sounds: Normal heart sounds. No murmur heard.   No friction rub. No gallop.  Pulmonary:     Effort: Pulmonary effort is normal. No respiratory distress.     Breath sounds: Normal breath sounds. No wheezing, rhonchi or rales.  Abdominal:     General: Abdomen is flat. Bowel sounds are normal.     Tenderness: There is no abdominal tenderness. There is no guarding.  Musculoskeletal:        General: No swelling or tenderness.     Right lower leg: No edema.     Left lower leg: No edema.  Skin:    General: Skin is warm.     Findings: No bruising, erythema or rash.  Neurological:     General: No focal deficit present.     Mental Status: He is alert and oriented to person, place, and time. Mental status is at baseline.     Cranial Nerves: No cranial nerve deficit.     Motor: No weakness.  Psychiatric:        Mood and Affect: Mood normal.        Behavior: Behavior normal.     Assessment & Plan:   See Encounters Tab for problem based charting.  Patient discussed with Dr. Jimmye Norman

## 2021-01-16 LAB — CMP14 + ANION GAP
ALT: 45 IU/L — ABNORMAL HIGH (ref 0–44)
AST: 28 IU/L (ref 0–40)
Albumin/Globulin Ratio: 1.6 (ref 1.2–2.2)
Albumin: 4.6 g/dL (ref 3.8–4.9)
Alkaline Phosphatase: 78 IU/L (ref 44–121)
Anion Gap: 18 mmol/L (ref 10.0–18.0)
BUN/Creatinine Ratio: 16 (ref 9–20)
BUN: 13 mg/dL (ref 6–24)
Bilirubin Total: 0.3 mg/dL (ref 0.0–1.2)
CO2: 21 mmol/L (ref 20–29)
Calcium: 10 mg/dL (ref 8.7–10.2)
Chloride: 102 mmol/L (ref 96–106)
Creatinine, Ser: 0.83 mg/dL (ref 0.76–1.27)
Globulin, Total: 2.9 g/dL (ref 1.5–4.5)
Glucose: 162 mg/dL — ABNORMAL HIGH (ref 65–99)
Potassium: 4.5 mmol/L (ref 3.5–5.2)
Sodium: 141 mmol/L (ref 134–144)
Total Protein: 7.5 g/dL (ref 6.0–8.5)
eGFR: 101 mL/min/{1.73_m2} (ref >59)

## 2021-01-16 LAB — CBC WITH DIFFERENTIAL/PLATELET
Basophils Absolute: 0.1 10*3/uL (ref 0.0–0.2)
Basos: 1 %
EOS (ABSOLUTE): 0.5 10*3/uL — ABNORMAL HIGH (ref 0.0–0.4)
Eos: 5 %
Hematocrit: 44.1 % (ref 37.5–51.0)
Hemoglobin: 14.9 g/dL (ref 13.0–17.7)
Immature Grans (Abs): 0 10*3/uL (ref 0.0–0.1)
Immature Granulocytes: 0 %
Lymphocytes Absolute: 3.2 10*3/uL — ABNORMAL HIGH (ref 0.7–3.1)
Lymphs: 33 %
MCH: 31.2 pg (ref 26.6–33.0)
MCHC: 33.8 g/dL (ref 31.5–35.7)
MCV: 93 fL (ref 79–97)
Monocytes Absolute: 1 10*3/uL — ABNORMAL HIGH (ref 0.1–0.9)
Monocytes: 10 %
Neutrophils Absolute: 4.8 10*3/uL (ref 1.4–7.0)
Neutrophils: 51 %
Platelets: 226 10*3/uL (ref 150–450)
RBC: 4.77 x10E6/uL (ref 4.14–5.80)
RDW: 12.1 % (ref 11.6–15.4)
WBC: 9.6 10*3/uL (ref 3.4–10.8)

## 2021-01-16 LAB — FOLATE: Folate: >20 ng/mL (ref >3.0)

## 2021-01-16 LAB — T4, FREE: Free T4: 1.47 ng/dL (ref 0.82–1.77)

## 2021-01-16 LAB — LIPID PANEL
Chol/HDL Ratio: 2.5 ratio (ref 0.0–5.0)
Cholesterol, Total: 127 mg/dL (ref 100–199)
HDL: 50 mg/dL (ref >39)
LDL Chol Calc (NIH): 58 mg/dL (ref 0–99)
Triglycerides: 101 mg/dL (ref 0–149)
VLDL Cholesterol Cal: 19 mg/dL (ref 5–40)

## 2021-01-16 LAB — TSH: TSH: 0.052 u[IU]/mL — ABNORMAL LOW (ref 0.450–4.500)

## 2021-01-16 LAB — VITAMIN B12: Vitamin B-12: 691 pg/mL (ref 232–1245)

## 2021-01-16 LAB — VITAMIN D 25 HYDROXY (VIT D DEFICIENCY, FRACTURES): Vit D, 25-Hydroxy: 48.8 ng/mL (ref 30.0–100.0)

## 2021-01-17 ENCOUNTER — Other Ambulatory Visit: Payer: Self-pay | Admitting: Internal Medicine

## 2021-01-17 DIAGNOSIS — E119 Type 2 diabetes mellitus without complications: Secondary | ICD-10-CM

## 2021-01-17 NOTE — Assessment & Plan Note (Addendum)
Patient presents to the clinic with complaints of fatigue associated with mental fog for the past 3-6 months. He is accompanied by his wife who provides additional detail.   His symptoms used to occur several times a week, but now appear to be an every day occurrence. Patient states that he will be going about his usual buisness when he will suddenly feel fatigued and will need to rest on the cough. During these episodes he will endorse feelings that his limbs are very heavy and difficult to lift off. His wife notes that during these episodes he knows who he is and where he is, but does not know what is going on around him. These episodes last 30 minutes. He has stopped taking his Xanax for the past 2 months because he thought it may contribute to his symptoms. He additionally endorses that he takes care of three young children that keep him very preoccupied.  A/P:  Patient presents to the clinic with fatigue. Upon chart review, it appears that this a chronic issue more than an acute issue. He has had extensive work ups in the past with negative results. His symptoms are likely multifactorial in etiology.   He is on several sedating medications such as Seroquel, benadryl, Lyrica, and Xanax, although his symptoms did not improve with xanax cessation. He is also on Lithium which has an extensive side effect profile including drowsiness, sedated states, confusion, disorientation, lethargy, and memory impairment. He's additionally on Lamictal which can also be sedating. He states he has been on these medications for years and has been stable on them. He has an appointment with his psychiatrist next month.   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 And he assists them in all their daily activities, which also drains his energy.   He has no symptoms that are indicating this could be cardiac related, but he does have a Hx of borderline HLD, HTN, and DM. His last echo was  last year, but was wnl. Given worsening fatigue could consider repeat ECHO.   He also has a Hx of COPD and is a current smoker. He has a Hx of pulmonary nodules and is undergoing yearly screening, with his last CT showing no change in his pulmonary nodule.   He additionally has a Hx of steatosis, will assess liver function as well.   Given Hx of HTN, BMI >35, male sex, and smoking Hx, OSA could be a considered.   Will repeat testing for fatigue, if unrevealing, consider sleep study or echo.  - Discuss medications at next Psychiatry visit - TSH - Free T4 - Vit D 25 - CBC - Vit B12 - Folate - CMP - Consider echo, sleep study if labs return wnl

## 2021-01-17 NOTE — Assessment & Plan Note (Signed)
Repeat Lipid Profile

## 2021-01-29 ENCOUNTER — Other Ambulatory Visit: Payer: Self-pay | Admitting: Student

## 2021-02-03 ENCOUNTER — Other Ambulatory Visit: Payer: Self-pay | Admitting: Internal Medicine

## 2021-02-03 DIAGNOSIS — E119 Type 2 diabetes mellitus without complications: Secondary | ICD-10-CM

## 2021-02-14 ENCOUNTER — Other Ambulatory Visit: Payer: Self-pay | Admitting: Internal Medicine

## 2021-02-14 DIAGNOSIS — G8929 Other chronic pain: Secondary | ICD-10-CM

## 2021-02-20 ENCOUNTER — Other Ambulatory Visit: Payer: Self-pay | Admitting: Student

## 2021-02-27 ENCOUNTER — Ambulatory Visit (INDEPENDENT_AMBULATORY_CARE_PROVIDER_SITE_OTHER): Payer: Medicare HMO | Admitting: Internal Medicine

## 2021-02-27 ENCOUNTER — Encounter: Payer: Self-pay | Admitting: Internal Medicine

## 2021-02-27 ENCOUNTER — Other Ambulatory Visit: Payer: Self-pay

## 2021-02-27 VITALS — BP 135/82 | HR 101 | Temp 98.4°F | Ht 72.0 in | Wt 260.6 lb

## 2021-02-27 DIAGNOSIS — J449 Chronic obstructive pulmonary disease, unspecified: Secondary | ICD-10-CM

## 2021-02-27 DIAGNOSIS — I10 Essential (primary) hypertension: Secondary | ICD-10-CM | POA: Diagnosis not present

## 2021-02-27 DIAGNOSIS — M9901 Segmental and somatic dysfunction of cervical region: Secondary | ICD-10-CM | POA: Diagnosis not present

## 2021-02-27 DIAGNOSIS — M5412 Radiculopathy, cervical region: Secondary | ICD-10-CM

## 2021-02-27 DIAGNOSIS — E119 Type 2 diabetes mellitus without complications: Secondary | ICD-10-CM | POA: Diagnosis not present

## 2021-02-27 DIAGNOSIS — E7800 Pure hypercholesterolemia, unspecified: Secondary | ICD-10-CM

## 2021-02-27 DIAGNOSIS — M47812 Spondylosis without myelopathy or radiculopathy, cervical region: Secondary | ICD-10-CM | POA: Insufficient documentation

## 2021-02-27 DIAGNOSIS — E1159 Type 2 diabetes mellitus with other circulatory complications: Secondary | ICD-10-CM | POA: Diagnosis not present

## 2021-02-27 DIAGNOSIS — M542 Cervicalgia: Secondary | ICD-10-CM

## 2021-02-27 LAB — POCT GLYCOSYLATED HEMOGLOBIN (HGB A1C): Hemoglobin A1C: 6.9 % — AB (ref 4.0–5.6)

## 2021-02-27 LAB — GLUCOSE, CAPILLARY: Glucose-Capillary: 149 mg/dL — ABNORMAL HIGH (ref 70–99)

## 2021-02-27 MED ORDER — VENTOLIN HFA 108 (90 BASE) MCG/ACT IN AERS: 2.0000 | INHALATION_SPRAY | Freq: Four times a day (QID) | RESPIRATORY_TRACT | 0 refills | Status: DC | PRN

## 2021-02-27 MED ORDER — INSULIN ASPART 100 UNIT/ML IJ SOLN: 10.0000 [IU] | Freq: Three times a day (TID) | INTRAMUSCULAR | Status: DC

## 2021-02-27 MED ORDER — "BD VEO INSULIN SYRINGE U/F 31G X 15/64"" 0.3 ML MISC": 0 refills | Status: DC

## 2021-02-27 MED ORDER — METFORMIN HCL 1000 MG PO TABS: 1000.0000 mg | ORAL_TABLET | Freq: Two times a day (BID) | ORAL | 0 refills | Status: DC

## 2021-02-27 MED ORDER — ACCU-CHEK FASTCLIX LANCETS MISC: 11 refills | Status: DC

## 2021-02-27 MED ORDER — ATORVASTATIN CALCIUM 40 MG PO TABS: 40.0000 mg | ORAL_TABLET | Freq: Every day | ORAL | 3 refills | Status: DC

## 2021-02-27 MED ORDER — LOSARTAN POTASSIUM 25 MG PO TABS: 50.0000 mg | ORAL_TABLET | Freq: Every day | ORAL | 0 refills | Status: DC

## 2021-02-27 MED ORDER — AMLODIPINE BESYLATE 10 MG PO TABS: 10.0000 mg | ORAL_TABLET | Freq: Every day | ORAL | 0 refills | Status: DC

## 2021-02-27 NOTE — Patient Instructions (Signed)
[name], it was a pleasure seeing you today!  Today we discussed: Neck pain- I placed an order for an x-ray for your neck.  After we get those results we will go from there for treatment.  Diabetes- your a1c was 6.9 today from 6.5.  We want that number to stay below 7.  For now we will keep your medications the same.  Try to limit sweet treats as much as you can.  We will recheck A1c in 3 months.  Fatigue episodes- We chatted about possible causes of increased fatigue.  If you change your mind about the sleep study please let us know.   I have ordered the following labs today:   Lab Orders  POC Hbg A1C    Tests ordered today:  Cervical x-ray  Referrals ordered today:   Referral Orders  No referral(s) requested today     I have ordered the following medication/changed the following medications:   Stop the following medications: Medications Discontinued During This Encounter  Medication Reason   diclofenac Sodium (VOLTAREN) 1 % GEL Duplicate     Start the following medications: No orders of the defined types were placed in this encounter.    Follow-up: 3 months   Please make sure to arrive 15 minutes prior to your next appointment. If you arrive late, you may be asked to reschedule.   We look forward to seeing you next time. Please call our clinic at [phone] if you have any questions or concerns. The best time to call is Monday-Friday from 9am-4pm, but there is someone available 24/7. If after hours or the weekend, call the main hospital number and ask for the Internal Medicine Resident On-Call. If you need medication refills, please notify your pharmacy one week in advance and they will send [place] a request.  Thank you for letting us take part in your care. Wishing you the best!  Thank you, Dr. Howie Ill

## 2021-02-27 NOTE — Assessment & Plan Note (Addendum)
A1c was increased to 6.9 from 6.5.  Patient states that he has increased his ice cream intake in the summer months.   -recheck A1c in 3 months -Continue on metformin 1000 mg and Novolog 10 units with meals. Insulin regimen changed 09/2020 due to pricing

## 2021-02-27 NOTE — Progress Notes (Signed)
   CC: neck pain and follow up of diabetes management  HPI:  [name] is a 60 y.o. with medical history as below presenting to [facility] for acute on chronic neck pain and f/u with DM management.  Please see problem-based list for further details, assessments, and plans.  Past Medical History:  Diagnosis Date   Beta-hemolytic group A streptococcal sepsis ([place])    Bipolar I disorder (HCC) 1991   Dr. Toy Care   Borderline hyperlipidemia    history of   Childhood asthma    COPD (chronic obstructive pulmonary disease) ([place])    Diabetes mellitus, new onset ([place]) 12/22/2017   Elevated glucose    Fibromyalgia    GERD (gastroesophageal reflux disease)    HTN (hypertension)    Low vitamin B12 level 06/07/2018   Lower back pain    Lumbar vertebral fracture (HCC) 01/29/2019   L1   Paranoia ([place])    Tobacco abuse    Tobacco abuse    Vitamin D deficiency    Review of Systems:  As per HPI  Physical Exam:  Vitals:   02/27/21 1312 02/27/21 1315  BP: (!) 140/92 135/82  Pulse: (!) 108 (!) 101  Temp: 98.4 F (36.9 C)   SpO2: 96% 97%  Weight: 260 lb 9.6 oz (118.2 kg)   Height: 6' (1.829 m)     General: well-developed, well-nourished, obese HENT: NCAT, erythematous rash around mouth Eyes:No scleral icterus, conjunctiva clear CV: Normal rate, nor murmurs Pulm: CTAB, normal pulmonary effort GI: no tenderness, normal bowel sounds MSK: decreased active range of motion of neck in flexion, extension and rotation to right. Skin: sunburn with skin peeling in arms and legs bilaterally Psych: normal mood and affect  Assessment & Plan:   See Encounters Tab for problem based charting.  Patient seen with Dr. Philipp Martin

## 2021-02-27 NOTE — Assessment & Plan Note (Signed)
BP was elevated on initial presentation. With repeat check decreased to 135/82.  Thinking this is most likely due to increased pain with movement from back and neck.

## 2021-02-28 ENCOUNTER — Ambulatory Visit: Payer: Medicare HMO | Admitting: Pharmacist

## 2021-03-05 ENCOUNTER — Other Ambulatory Visit: Payer: Self-pay | Admitting: Internal Medicine

## 2021-03-05 ENCOUNTER — Other Ambulatory Visit: Payer: Self-pay | Admitting: Student

## 2021-03-05 DIAGNOSIS — E119 Type 2 diabetes mellitus without complications: Secondary | ICD-10-CM

## 2021-03-06 ENCOUNTER — Ambulatory Visit (HOSPITAL_COMMUNITY)
Admission: RE | Admit: 2021-03-06 | Discharge: 2021-03-06 | Disposition: A | Discharge status: Home / Self Care | Payer: Medicare HMO | Source: Ambulatory Visit | Attending: Internal Medicine | Admitting: Internal Medicine

## 2021-03-06 ENCOUNTER — Other Ambulatory Visit: Payer: Self-pay

## 2021-03-06 DIAGNOSIS — M542 Cervicalgia: Secondary | ICD-10-CM | POA: Diagnosis not present

## 2021-03-10 ENCOUNTER — Other Ambulatory Visit: Payer: Self-pay | Admitting: Student

## 2021-03-10 ENCOUNTER — Telehealth: Payer: Self-pay | Admitting: Internal Medicine

## 2021-03-10 DIAGNOSIS — G8929 Other chronic pain: Secondary | ICD-10-CM

## 2021-03-10 NOTE — Addendum Note (Signed)
Addended by: Jodean Lima on: 03/10/2021 09:39 AM   Modules accepted: Orders

## 2021-03-10 NOTE — Progress Notes (Signed)
Internal Medicine Clinic Attending  I saw and evaluated the patient.  I personally confirmed the key portions of the history and exam documented by Dr. Howie Ill and I reviewed pertinent patient test results.  The assessment, diagnosis, and plan were formulated together and I agree with the documentation in the resident's note.   Given cervical neck Xray findings and relatively acute onset of symptoms, recommend proceeding with cervical MRI due to concern for impingement. I have placed this order. Will forward to the red team to call patient. With results and plan.

## 2021-03-10 NOTE — Telephone Encounter (Signed)
  Reason for call:   I placed an outgoing call to [name]'s wife at 11:42 AM regarding the patient's x-ray results. The call was successful. We discussed the patient's degenerative disc disease and foraminal narrowing on his right.  I counseled the patient's wife regarding treatment options including physical therapy, heat/cold compresses, pain management with daily Tylenol use, short courses of NSAIDs, and steroid injections.  I told her  to call us back if he would like to schedule follow-up appointment for additional management and resources.  Assessment/ Plan:  Continue Tylenol daily with a max dose of 3 to 4 g Use NSAIDs sparingly Counseled regarding the use of heat/cool compresses Counseled patient to make a follow-up appointment for additional resources as needed As always, pt was advised that if symptoms worsen or new symptoms arise, they should go to an urgent care facility or to to ER for further evaluation.  Signature: Lawerance Cruel, D.O.  Internal Medicine Resident, PGY-3 [facility]  Pager: [phone] 11:42 AM, 03/10/2021

## 2021-03-19 ENCOUNTER — Other Ambulatory Visit: Payer: Self-pay | Admitting: Student

## 2021-03-27 ENCOUNTER — Ambulatory Visit: Payer: Medicare HMO | Admitting: Pharmacist

## 2021-04-02 ENCOUNTER — Other Ambulatory Visit: Payer: Self-pay | Admitting: Student

## 2021-04-02 DIAGNOSIS — E119 Type 2 diabetes mellitus without complications: Secondary | ICD-10-CM

## 2021-04-05 ENCOUNTER — Other Ambulatory Visit: Payer: Self-pay | Admitting: Student

## 2021-04-08 ENCOUNTER — Other Ambulatory Visit: Payer: Self-pay | Admitting: Student

## 2021-04-09 ENCOUNTER — Encounter: Payer: Medicare HMO | Admitting: Internal Medicine

## 2021-04-09 NOTE — Progress Notes (Deleted)
Neck pain X-ray showed IMPRESSION: Lower cervical spondylosis, with right predominant neural foraminal narrowing from C4-5 through C6-7. Counseled on PT, heat/cold compresses, pain management with tylenol, short course of NSAIDs, and steroid injections. MRI scheduled for 04/11/21

## 2021-04-09 NOTE — Assessment & Plan Note (Signed)
Patient states that this pain started last month.  He states that he gets shooting pain up his neck when he moves his neck.  He does not recall an injury to his neck.  He has been taking using voltaren gel, tylenol and NSAIDs without relief. -Cervical neck xray ordered, will consider a short course of muscle relaxers after xray results.

## 2021-04-11 ENCOUNTER — Ambulatory Visit (HOSPITAL_COMMUNITY): Admission: RE | Admit: 2021-04-11 | Payer: Medicare HMO | Source: Ambulatory Visit

## 2021-04-11 ENCOUNTER — Other Ambulatory Visit: Payer: Self-pay | Admitting: Internal Medicine

## 2021-04-11 DIAGNOSIS — G8929 Other chronic pain: Secondary | ICD-10-CM

## 2021-04-15 ENCOUNTER — Other Ambulatory Visit: Payer: Self-pay | Admitting: Internal Medicine

## 2021-04-15 DIAGNOSIS — G8929 Other chronic pain: Secondary | ICD-10-CM

## 2021-04-21 ENCOUNTER — Encounter: Payer: Self-pay | Admitting: Internal Medicine

## 2021-04-21 ENCOUNTER — Other Ambulatory Visit: Payer: Self-pay

## 2021-04-21 ENCOUNTER — Ambulatory Visit (INDEPENDENT_AMBULATORY_CARE_PROVIDER_SITE_OTHER): Payer: Medicare HMO | Admitting: Internal Medicine

## 2021-04-21 VITALS — BP 145/94 | HR 91 | Temp 98.4°F | Resp 32 | Ht 72.0 in | Wt 267.9 lb

## 2021-04-21 DIAGNOSIS — Z23 Encounter for immunization: Secondary | ICD-10-CM

## 2021-04-21 DIAGNOSIS — M47812 Spondylosis without myelopathy or radiculopathy, cervical region: Secondary | ICD-10-CM | POA: Diagnosis not present

## 2021-04-21 DIAGNOSIS — F319 Bipolar disorder, unspecified: Secondary | ICD-10-CM | POA: Diagnosis not present

## 2021-04-21 MED ORDER — MELOXICAM 7.5 MG PO TABS: 7.5000 mg | ORAL_TABLET | Freq: Every day | ORAL | 0 refills | Status: DC

## 2021-04-21 MED ORDER — MELOXICAM 7.5 MG PO TABS: 7.5000 mg | ORAL_TABLET | Freq: Every day | ORAL | 0 refills | Status: AC | PRN

## 2021-04-21 NOTE — Patient Instructions (Addendum)
Thank you, Mr.Jamoni W Muzzy for allowing [place] to provide your care today. Today we discussed your neck and back pain.    Neck/back pain - Please call [facility] at [phone] to schedule your MRI. We have also sent in a prescription for Mobic, an NSAID (non-steroid anti-inflammatory drug) to help with the pain. Based on your MRI results, we may refer you to a specialist who can give you a spinal steroid injection.    I have ordered the following medication/changed the following medications:   Start the following medications: Meds ordered this encounter  Medications   DISCONTD: meloxicam (MOBIC) 7.5 MG tablet    Sig: Take 1 tablet (7.5 mg total) by mouth daily for 10 days.    Dispense:  10 tablet    Refill:  0   meloxicam (MOBIC) 7.5 MG tablet    Sig: Take 1 tablet (7.5 mg total) by mouth daily as needed for up to 10 days for pain.    Dispense:  10 tablet    Refill:  0    Follow up: 6 weeks  Should you have any questions or concerns please call the internal medicine clinic at [phone].    Maudie Mercury, Medical Student  Maudie Mercury, M.D.   [place]

## 2021-04-21 NOTE — Assessment & Plan Note (Addendum)
Patient presents with 3 months of persistent pain at cervical neck. X-ray from 7/31 revealed lower cervical spondylosis with right predominant foraminal narrowing. The pain is a constant pressure accompanied by a sharp, stabbing pain that starts at his neck and moves down his shoulder blades to his thoracic region, and is significantly impacting his quality of life. Physical exam is notable for reduced range of motion and positive Spurling maneuvers, suggestive of cervical radiculopathy. He denies red-flag symptoms including fever, lower extremity weakness, fecal incontinence, or new-onset or worsening urinary incontinence. He was unaware that an MRI was ordered on 8/1, so we will plan for him to call [facility] to schedule the MRI. We will also start a short course of NSAIDs to help alleviate the pain. We will follow-up on next steps, including potential specialist referral for glucocorticoid injection, after reviewing MRI results.   Plan:  -Patient will undergo MRI  -Start Mobic 7.5 mg daily for 10 days  -Follow-up in 4-6 weeks after MRI results have been reviewed

## 2021-04-21 NOTE — Progress Notes (Signed)
   HPI:  [name] with a history of chronic lower back pain, COPD, hypertension, GERD, and bipolar disorder who presents for follow-up of cervical spine pain.   He was seen on 7/21 for the same concern and states that the pain has persisted. X-ray from 7/31 revealed lower cervical spondylosis with right predominant foraminal narrowing.  He reports that the pain started in June and is a constant pressure accompanied by a sharp, stabbing pain that starts at his neck and moves down his shoulder blades to his thoracic region. He denies any trauma. He has been taking about 4028m of Tylenol daily for the pain which has provided some relief. Heat compress has not helped. He does not want PT, citing financial concerns. In the past, he has unwilling to receive steroid injections but now feels that the pain is bad enough to consider injections. He states his quality of life has significantly worsened. He reports constant pain that is worsened when he tries to move his neck. He denies lower extremity weakness, fecal incontinence, or new-onset or worsening urinary incontinence. He reports intermittent urinary incontinence that has been present for several years.  Physical Exam:  Vitals:   04/21/21 0958  BP: (!) 145/94  Pulse: 91  Resp: (!) 32  Temp: 98.4 F (36.9 C)  TempSrc: Oral  SpO2: 97%  Weight: 267 lb 14.4 oz (121.5 kg)  Height: 6' (1.829 m)   Physical Exam Vitals reviewed.  Musculoskeletal:     Cervical back: Neck supple. No edema, erythema or signs of trauma. Pain with movement present. No spinous process tenderness or muscular tenderness. Decreased range of motion.  Neurological:     Mental Status: He is alert and oriented to person, place, and time.     Sensory: Sensation is intact.     Motor: Motor function is intact.     Coordination: Coordination is intact.     Gait: Gait is intact.  Positive Spurling maneuver neck with upward shooting pain from  shoulders.     Assessment & Plan:   See Encounters Tab for problem based charting.  Patient discussed with Dr. MDaryll Drown

## 2021-04-23 NOTE — Progress Notes (Signed)
Internal Medicine Clinic Attending  Case discussed with Dr. Gilford Rile  At the time of the visit.  We reviewed the resident's history and exam and pertinent patient test results.  I agree with the assessment, diagnosis, and plan of care documented in the resident's note.

## 2021-04-28 ENCOUNTER — Other Ambulatory Visit: Payer: Self-pay | Admitting: Student

## 2021-04-29 ENCOUNTER — Other Ambulatory Visit: Payer: Self-pay | Admitting: Internal Medicine

## 2021-04-29 DIAGNOSIS — E119 Type 2 diabetes mellitus without complications: Secondary | ICD-10-CM

## 2021-05-01 ENCOUNTER — Other Ambulatory Visit: Payer: Self-pay | Admitting: Internal Medicine

## 2021-05-01 DIAGNOSIS — E119 Type 2 diabetes mellitus without complications: Secondary | ICD-10-CM

## 2021-05-01 MED ORDER — TRELEGY ELLIPTA 100-62.5-25 MCG/INH IN AEPB: INHALATION_SPRAY | RESPIRATORY_TRACT | 0 refills | Status: DC

## 2021-05-02 NOTE — Telephone Encounter (Signed)
Patient's wife called, Lyrica not at pharmacy. Lyrica did not transmit, it was in print mode.  I gave a VO for it:  Pregabalin (Lyrica) 167m; Take 1 capsule by mouth twice daily.  #60 with no refills Dr. CEverett Graffper your RX on 9/20  Thanks! Byard Carranza

## 2021-05-09 ENCOUNTER — Other Ambulatory Visit: Payer: Self-pay | Admitting: Student

## 2021-05-09 ENCOUNTER — Other Ambulatory Visit: Payer: Medicare HMO

## 2021-05-09 DIAGNOSIS — G8929 Other chronic pain: Secondary | ICD-10-CM

## 2021-05-11 ENCOUNTER — Other Ambulatory Visit: Payer: Self-pay | Admitting: Internal Medicine

## 2021-05-13 NOTE — Telephone Encounter (Signed)
NOVOLOG 100 UNIT/ML injection,  traMADol (ULTRAM) 50 MG tablet, refill request @ [facility] [address] [place] [address]Marland Kitchen

## 2021-05-14 ENCOUNTER — Other Ambulatory Visit: Payer: Self-pay

## 2021-05-14 DIAGNOSIS — G8929 Other chronic pain: Secondary | ICD-10-CM

## 2021-05-15 ENCOUNTER — Other Ambulatory Visit: Payer: Self-pay | Admitting: Internal Medicine

## 2021-05-15 DIAGNOSIS — E119 Type 2 diabetes mellitus without complications: Secondary | ICD-10-CM

## 2021-05-16 ENCOUNTER — Other Ambulatory Visit: Payer: Self-pay

## 2021-05-16 ENCOUNTER — Ambulatory Visit
Admission: RE | Admit: 2021-05-16 | Discharge: 2021-05-16 | Disposition: A | Discharge status: Home / Self Care | Payer: Medicare HMO | Source: Ambulatory Visit | Attending: Internal Medicine | Admitting: Internal Medicine

## 2021-05-16 DIAGNOSIS — M4802 Spinal stenosis, cervical region: Secondary | ICD-10-CM | POA: Diagnosis not present

## 2021-05-16 DIAGNOSIS — M5412 Radiculopathy, cervical region: Secondary | ICD-10-CM

## 2021-05-16 MED ORDER — TRAMADOL HCL 50 MG PO TABS: 50.0000 mg | ORAL_TABLET | Freq: Two times a day (BID) | ORAL | 0 refills | Status: DC | PRN

## 2021-05-20 ENCOUNTER — Other Ambulatory Visit: Payer: Self-pay | Admitting: Internal Medicine

## 2021-05-20 DIAGNOSIS — M542 Cervicalgia: Secondary | ICD-10-CM

## 2021-05-20 DIAGNOSIS — M47812 Spondylosis without myelopathy or radiculopathy, cervical region: Secondary | ICD-10-CM

## 2021-05-20 DIAGNOSIS — M5412 Radiculopathy, cervical region: Secondary | ICD-10-CM

## 2021-05-20 NOTE — Progress Notes (Signed)
Called and spoke with patient's wife about results of MRI.  MRI showed disc bulge at several levels of his neck and disc degeneration of C5-6 and C6-7. Patient is not interested in physical therapy at this time due to expense.  He has been taking 600 mg tylenol multiple times daily with some relief of pain.  He would be willing to have steroid injections at this time. Will refer patient to neurosurgery.

## 2021-05-28 ENCOUNTER — Other Ambulatory Visit: Payer: Self-pay | Admitting: Internal Medicine

## 2021-05-28 DIAGNOSIS — E119 Type 2 diabetes mellitus without complications: Secondary | ICD-10-CM

## 2021-05-31 ENCOUNTER — Other Ambulatory Visit: Payer: Self-pay | Admitting: Internal Medicine

## 2021-06-10 ENCOUNTER — Other Ambulatory Visit: Payer: Self-pay | Admitting: Student

## 2021-06-10 DIAGNOSIS — G8929 Other chronic pain: Secondary | ICD-10-CM

## 2021-06-11 ENCOUNTER — Other Ambulatory Visit: Payer: Self-pay | Admitting: Internal Medicine

## 2021-06-11 ENCOUNTER — Other Ambulatory Visit: Payer: Self-pay | Admitting: Student

## 2021-06-11 DIAGNOSIS — E7800 Pure hypercholesterolemia, unspecified: Secondary | ICD-10-CM

## 2021-06-11 NOTE — Telephone Encounter (Signed)
atorvastatin (LIPITOR) 40 MG tablet, REFILL REQUEST @ [facility], [place] [address]

## 2021-06-16 NOTE — Progress Notes (Deleted)
Has he been to ortho?  MRI showed disc bulge at several levels of neck and dis degeneration of C5-6 and C6-7. He is not interested in PT at this time.  Referred to neurosurgery for possibel steroid injections

## 2021-06-17 ENCOUNTER — Other Ambulatory Visit: Payer: Self-pay | Admitting: Student

## 2021-06-17 ENCOUNTER — Other Ambulatory Visit: Payer: Self-pay

## 2021-06-17 ENCOUNTER — Ambulatory Visit (INDEPENDENT_AMBULATORY_CARE_PROVIDER_SITE_OTHER): Payer: Medicare HMO | Admitting: Student

## 2021-06-17 ENCOUNTER — Encounter: Payer: Self-pay | Admitting: Internal Medicine

## 2021-06-17 VITALS — BP 155/92 | HR 89 | Temp 98.3°F

## 2021-06-17 DIAGNOSIS — E7800 Pure hypercholesterolemia, unspecified: Secondary | ICD-10-CM | POA: Diagnosis not present

## 2021-06-17 DIAGNOSIS — M47812 Spondylosis without myelopathy or radiculopathy, cervical region: Secondary | ICD-10-CM | POA: Diagnosis not present

## 2021-06-17 DIAGNOSIS — G8929 Other chronic pain: Secondary | ICD-10-CM | POA: Diagnosis not present

## 2021-06-17 MED ORDER — TRAMADOL HCL 50 MG PO TABS: 50.0000 mg | ORAL_TABLET | Freq: Two times a day (BID) | ORAL | 0 refills | Status: DC | PRN

## 2021-06-17 MED ORDER — ATORVASTATIN CALCIUM 40 MG PO TABS: 40.0000 mg | ORAL_TABLET | Freq: Every day | ORAL | 3 refills | Status: DC

## 2021-06-17 NOTE — Assessment & Plan Note (Addendum)
Assessment: Patient with history of chronic pain, current regimen of tramadol 50 mg BID as needed. Does not appear as though patient has had Urine tox screen done at our clinic, will perform today.   Of note, patient has been out of tramadol for the past 2 weeks. Patient states he has been taking increased amounts of his tramadol, at times 3 times a day. Discussed with patient and his wife that it will be important to follow scheduled regimen and if needing increasing doses to schedule an appointment to be evaluated for increasing pain.   PDMP reviewed and appropriate.   Plan: -refill tramadol 50 mg BID -U Tox pending  Addendum Utox without tramadol. Patient noted to be out of medication for two weeks prior to UDS. Will need repeat at follow up visit to assure adherence.

## 2021-06-17 NOTE — Assessment & Plan Note (Signed)
Assessment: MRI cervical spine with mild spinal narrowing. C4-C5 with small central disc protrusion with indeterminate spinal cord compression. Multiple levels of cervical foramina stenosis. Disc degeneration at C5-C6 and C6-C7  Patient continues to endorse neck pain. Planned for referral to neurosurgery however patient states he never received a phone call. Received letter from neurosurgery office stating they attempted to call but were unable to reach patient.   After discussion of risk and benefits of following up and possibly having steroid injections performed, patient has decided to call neurosurgery clinic back to schedule an appointment. Reminded them if they have difficulty rescheduling, to please call our clinic.   Plan: -Follow up with neurosurgery

## 2021-06-17 NOTE — Patient Instructions (Addendum)
Thank you, Mr.Marston W Scriven for allowing [place] to provide your care today. Today we discussed .  Neck Pain  Please call the neurosurgeons office back and schedule an appointment to see them. If you need a new referral please call our clinic.   I will be working with Dr. Howie Ill to get your medications refilled for three months at a time. We only refill pain medications for 1 month at a time.    I have ordered the following labs for you:  Lab Orders  No laboratory test(s) ordered today     Referrals ordered today:   Referral Orders  No referral(s) requested today     I have ordered the following medication/changed the following medications:   Stop the following medications: There are no discontinued medications.   Start the following medications: No orders of the defined types were placed in this encounter.    Follow up:  As needed    Should you have any questions or concerns please call the internal medicine clinic at [phone].    [place] Letters, D.O. [place]

## 2021-06-17 NOTE — Progress Notes (Signed)
CC: follow up neck pain  HPI:  [name] with a past medical history stated below and presents today to follow up on neck pain and cervical MRI results. Please see problem based assessment and plan for additional details.  Past Medical History:  Diagnosis Date   Beta-hemolytic group A streptococcal sepsis ([place])    Bipolar I disorder (HCC) 1991   Dr. Toy Care   Borderline hyperlipidemia    history of   Childhood asthma    COPD (chronic obstructive pulmonary disease) ([place])    Diabetes mellitus, new onset ([place]) 12/22/2017   Elevated glucose    Fibromyalgia    GERD (gastroesophageal reflux disease)    HTN (hypertension)    Low vitamin B12 level 06/07/2018   Lower back pain    Lumbar vertebral fracture (HCC) 01/29/2019   L1   Paranoia ([place])    Tobacco abuse    Tobacco abuse    Vitamin D deficiency     Current Outpatient Medications on File Prior to Visit  Medication Sig Dispense Refill   Accu-Chek FastClix Lancets MISC Check blood sugar 3 times a day. 102 each 11   alprazolam (XANAX) 2 MG tablet Take 2 mg by mouth at bedtime as needed for sleep or anxiety.      amLODipine (NORVASC) 10 MG tablet Take 1 tablet (10 mg total) by mouth daily. 90 tablet 0   atorvastatin (LIPITOR) 40 MG tablet Take 1 tablet (40 mg total) by mouth daily at 6 PM. 90 tablet 3   blood glucose meter kit and supplies KIT Dispense based on patient and insurance preference. Use up to four times daily as directed. (FOR ICD-9 250.00, 250.01). 1 each 0   Blood Glucose Monitoring Suppl (ACCU-CHEK GUIDE) w/Device KIT 1 each by Does not apply route 3 (three) times daily. 1 kit 1   Capsaicin 0.025 % PADS Apply 1 application topically 3 (three) times daily as needed. 20 each 0   capsicum (ZOSTRIX) 0.075 % topical cream Apply 1 application topically 3 (three) times daily as needed. 114 g 0   diclofenac sodium (VOLTAREN) 1 % GEL Apply 2 g topically 4 (four) times daily. 350 g 6    diphenhydrAMINE (BENADRYL) 25 mg capsule Take 25 mg by mouth every 8 (eight) hours as needed for allergies.     EPINEPHrine (EPIPEN 2-PAK) 0.3 mg/0.3 mL IJ SOAJ injection Inject 0.3 mg into the muscle once as needed (for anaphylactic reaction to bee stings).     glucose blood (ACCU-CHEK GUIDE) test strip Check blood sugar 3 times a day. 100 each 12   Insulin Syringe-Needle U-100 (BD VEO INSULIN SYRINGE U/F) 31G X 15/64" 0.3 ML MISC USE TWICE DAILY TO INJECT INSULIN 100 each 0   ipratropium-albuterol (DUONEB) 0.5-2.5 (3) MG/3ML SOLN Take 3 mLs by nebulization every 4 (four) hours as needed. 360 mL 1   lamoTRIgine (LAMICTAL) 150 MG tablet Take 150 mg by mouth 2 (two) times daily.     lithium carbonate (ESKALITH) 450 MG CR tablet Take 450-675 mg by mouth See admin instructions. 675 mg by mouth in the morning and 450 mg at bedtime     losartan (COZAAR) 25 MG tablet Take 2 tablets (50 mg total) by mouth daily. 180 tablet 0   metFORMIN (GLUCOPHAGE) 1000 MG tablet Take 1 tablet (1,000 mg total) by mouth 2 (two) times daily with a meal. 180 tablet 0   multivitamin (ONE-A-DAY MEN'S) TABS tablet Take 1 tablet by  mouth daily.     NOVOLOG 100 UNIT/ML injection INJECT 10 UNITS SUBCUTANEOUSLY THREE TIMES DAILY WITH MEALS 10 mL 0   omeprazole (PRILOSEC) 40 MG capsule Take 1 capsule by mouth once daily 90 capsule 0   pregabalin (LYRICA) 150 MG capsule Take 1 capsule by mouth twice daily 60 capsule 2   QUEtiapine (SEROQUEL) 50 MG tablet Take 225 mg by mouth at bedtime.     TRELEGY ELLIPTA 100-62.5-25 MCG/ACT AEPB INHALE 1 PUFF ONCE DAILY 60 each 3   VENTOLIN HFA 108 (90 Base) MCG/ACT inhaler Inhale 2 puffs into the lungs every 6 (six) hours as needed for wheezing or shortness of breath. 1 each 0   No current facility-administered medications on file prior to visit.    Family History  Adopted: Yes    Social History   Socioeconomic History   Marital status: Married    Spouse name: Not on file   Number of  children: Not on file   Years of education: Not on file   Highest education level: Not on file  Occupational History   Not on file  Tobacco Use   Smoking status: Every Day    Packs/day: 0.75    Years: 45.00    Pack years: 33.75    Types: Cigarettes   Smokeless tobacco: Never   Tobacco comments:    Smoking 15 cigs per day  Vaping Use   Vaping Use: Never used  Substance and Sexual Activity   Alcohol use: Yes    Comment: Beer sometimes.   Drug use: No   Sexual activity: Not on file  Other Topics Concern   Not on file  Social History Narrative   Lives with wife Emile Kyllo (married 55 years) and son. Has 4 dogs and fish. Not employed. Woodworking for fun. HS graduate.       Quit smoking in 2008. 26 pack years. No recreational drug use. Drinks 1 glass of wine per month. Does not exercise.    Social Determinants of Health   Financial Resource Strain: Not on file  Food Insecurity: Not on file  Transportation Needs: Not on file  Physical Activity: Not on file  Stress: Not on file  Social Connections: Not on file  Intimate Partner Violence: Not on file    Review of Systems: ROS negative except for what is noted on the assessment and plan.  Vitals:   06/17/21 1524  BP: (!) 155/92  Pulse: 89  Temp: 98.3 F (36.8 C)  TempSrc: Oral  SpO2: 98%   Physical Exam: Constitutional: well appearing, NAD HENT: normocephalic atraumatic Eyes: conjunctiva non-erythematous Neck: supple Cardiovascular: regular rate Pulmonary/Chest: normal work of breathing on room air MSK: normal bulk and tone Neurological: alert & oriented x 3 Skin: warm and dry Psych: normal mood and thought process   Assessment & Plan:   See Encounters Tab for problem based charting.  Patient discussed with Dr. Caffie Damme, D.O. [facility], PGY-2 Pager: [phone], Phone: [phone] Date 06/17/2021 Time 8:28 PM

## 2021-06-18 NOTE — Progress Notes (Signed)
Internal Medicine Clinic Attending  Case discussed with Dr. Katsadouros  At the time of the visit.  We reviewed the resident's history and exam and pertinent patient test results.  I agree with the assessment, diagnosis, and plan of care documented in the resident's note.  

## 2021-06-24 LAB — TOXASSURE SELECT,+ANTIDEPR,UR

## 2021-06-30 ENCOUNTER — Other Ambulatory Visit: Payer: Self-pay | Admitting: Student

## 2021-06-30 DIAGNOSIS — E119 Type 2 diabetes mellitus without complications: Secondary | ICD-10-CM

## 2021-07-14 ENCOUNTER — Other Ambulatory Visit: Payer: Self-pay | Admitting: Internal Medicine

## 2021-07-14 ENCOUNTER — Other Ambulatory Visit: Payer: Self-pay | Admitting: Student

## 2021-07-14 DIAGNOSIS — K219 Gastro-esophageal reflux disease without esophagitis: Secondary | ICD-10-CM

## 2021-07-14 DIAGNOSIS — G8929 Other chronic pain: Secondary | ICD-10-CM

## 2021-07-14 DIAGNOSIS — I10 Essential (primary) hypertension: Secondary | ICD-10-CM

## 2021-07-15 MED ORDER — OMEPRAZOLE 40 MG PO CPDR
40.0000 mg | DELAYED_RELEASE_CAPSULE | Freq: Every day | ORAL | 0 refills | Status: DC
Start: 2021-07-15 — End: 2021-12-30

## 2021-07-15 NOTE — Telephone Encounter (Signed)
PDMP reviewed and appropriate

## 2021-07-21 ENCOUNTER — Other Ambulatory Visit: Payer: Self-pay | Admitting: Student

## 2021-07-28 ENCOUNTER — Other Ambulatory Visit: Payer: Self-pay | Admitting: Internal Medicine

## 2021-07-28 DIAGNOSIS — E119 Type 2 diabetes mellitus without complications: Secondary | ICD-10-CM

## 2021-08-11 ENCOUNTER — Other Ambulatory Visit: Payer: Self-pay | Admitting: Internal Medicine

## 2021-08-11 DIAGNOSIS — G8929 Other chronic pain: Secondary | ICD-10-CM

## 2021-08-12 NOTE — Telephone Encounter (Signed)
Last refill 07/15/2021.  Last visit 06/17/2021.  ToxAssure 06/24/2021.

## 2021-08-13 ENCOUNTER — Telehealth: Payer: Self-pay

## 2021-08-13 NOTE — Telephone Encounter (Signed)
traMADol (ULTRAM) 50 MG tablet, refill request @ [facility], [place] - [address]

## 2021-08-13 NOTE — Telephone Encounter (Signed)
Rx sent today. Receipt confirmed by pharmacy (08/13/2021  3:38 PM EST)

## 2021-08-13 NOTE — Telephone Encounter (Signed)
PDMP reviewed, refill appropriate

## 2021-08-17 ENCOUNTER — Other Ambulatory Visit: Payer: Self-pay | Admitting: Internal Medicine

## 2021-08-17 DIAGNOSIS — E119 Type 2 diabetes mellitus without complications: Secondary | ICD-10-CM

## 2021-08-28 ENCOUNTER — Other Ambulatory Visit: Payer: Self-pay

## 2021-08-28 ENCOUNTER — Encounter: Payer: Self-pay | Admitting: Internal Medicine

## 2021-08-28 ENCOUNTER — Ambulatory Visit (INDEPENDENT_AMBULATORY_CARE_PROVIDER_SITE_OTHER): Payer: Medicare HMO | Admitting: Internal Medicine

## 2021-08-28 VITALS — BP 144/95 | HR 103 | Temp 98.5°F | Resp 28 | Ht 72.0 in | Wt 265.2 lb

## 2021-08-28 DIAGNOSIS — E119 Type 2 diabetes mellitus without complications: Secondary | ICD-10-CM

## 2021-08-28 DIAGNOSIS — J069 Acute upper respiratory infection, unspecified: Secondary | ICD-10-CM

## 2021-08-28 DIAGNOSIS — I10 Essential (primary) hypertension: Secondary | ICD-10-CM | POA: Diagnosis not present

## 2021-08-28 LAB — GLUCOSE, CAPILLARY: Glucose-Capillary: 160 mg/dL — ABNORMAL HIGH (ref 70–99)

## 2021-08-28 MED ORDER — LOSARTAN POTASSIUM 100 MG PO TABS: 100.0000 mg | ORAL_TABLET | Freq: Every day | ORAL | 11 refills | Status: DC

## 2021-08-28 MED ORDER — "BD VEO INSULIN SYRINGE U/F 31G X 15/64"" 0.3 ML MISC": 0 refills | Status: DC

## 2021-08-28 NOTE — Patient Instructions (Addendum)
To Mr. Chapel,   It was a pleasure seeing you again! Today, we discussed your diabetes, blood pressure, and cough.   For your diabetes, I will call you with the results of your A1c. I am going to refill your needles today.   For your blood pressure, I am going to increase your Losartan to the maximum dose of 100 mg daily. We will check your blood pressure in two weeks, and get blood work for your kidneys to fax to your psychiatrist.   For your cough, I am glad to see that there is no wheezing on physical examination. This is likely an upper respiratory viral illness. I would recommend Chloraseptic spray for throat irritation, guaifenesin to break up your mucus, and if you want an over the counter cold medication Coricidin.   We will follow back with you in 2 weeks to see your blood pressure readings! Have a good day and happy belated birthday,  Maudie Mercury, MD

## 2021-08-30 NOTE — Assessment & Plan Note (Signed)
Cough with sore throat for the past 5 days. Denies fevers, nausea, vomiting. Endorses that approximately 1/4 of the time his cough is productive for thin white mucous. Given his blood pressure, he has not tried any OTC medications. Lungs CTAB today, this is unlikely a COPD exacerbation. Discussed conservative measures and that Coricidin will not affect BP. If he begins to spike fevers or develop increased SHOB, he is to call the office or present to UC/ED - Continued conservative manage.

## 2021-08-30 NOTE — Progress Notes (Signed)
CC: Diabetes follow up, HTN, cough,   HPI:  Mr.Bayley W Speiser is a 61 y.o. person, with a PMH noted below, who presents to the clinic Diabetes follow up, cough, and Hypertension. To see the management of their acute and chronic conditions, please see the A&P note under the Encounters tab.   Past Medical History:  Diagnosis Date   Beta-hemolytic group A streptococcal sepsis ([place])    Bipolar I disorder (HCC) 1991   Dr. Toy Care   Borderline hyperlipidemia    history of   Childhood asthma    COPD (chronic obstructive pulmonary disease) ([place])    Diabetes mellitus, new onset ([place]) 12/22/2017   Elevated glucose    Fibromyalgia    GERD (gastroesophageal reflux disease)    HTN (hypertension)    Low vitamin B12 level 06/07/2018   Lower back pain    Lumbar vertebral fracture (HCC) 01/29/2019   L1   Paranoia ([place])    Tobacco abuse    Tobacco abuse    Vitamin D deficiency    Review of Systems:   Review of Systems  Constitutional:  Negative for chills, fever, malaise/fatigue and weight loss.  HENT:  Positive for sore throat. Negative for congestion, hearing loss, nosebleeds, sinus pain and tinnitus.   Eyes:  Negative for blurred vision and double vision.  Respiratory:  Positive for cough. Negative for hemoptysis, sputum production, shortness of breath and wheezing.   Cardiovascular:  Negative for chest pain and palpitations.  Gastrointestinal:  Negative for abdominal pain, constipation, diarrhea, nausea and vomiting.  Neurological:  Negative for dizziness, tingling and headaches.    Physical Exam:  Vitals:   08/28/21 1601 08/28/21 1608  BP: (!) 148/92 (!) 144/95  Pulse: (!) 101 (!) 103  Resp: (!) 28   Temp: 98.5 F (36.9 C)   TempSrc: Oral   SpO2: 96%   Weight: 265 lb 3.2 oz (120.3 kg)   Height: 6' (1.829 m)    Physical Exam Vitals and nursing note reviewed.  Constitutional:      General: He is not in acute distress.    Appearance: Normal appearance. He is not  ill-appearing or toxic-appearing.  HENT:     Head: Normocephalic and atraumatic.     Mouth/Throat:     Mouth: Mucous membranes are moist.     Pharynx: No oropharyngeal exudate or posterior oropharyngeal erythema.  Eyes:     General:        Right eye: No discharge.        Left eye: No discharge.     Conjunctiva/sclera: Conjunctivae normal.  Cardiovascular:     Rate and Rhythm: Normal rate and regular rhythm.     Pulses: Normal pulses.     Heart sounds: Normal heart sounds. No murmur heard.   No friction rub. No gallop.  Pulmonary:     Effort: Pulmonary effort is normal. No respiratory distress.     Breath sounds: Normal breath sounds. No stridor. No wheezing, rhonchi or rales.  Abdominal:     General: Bowel sounds are normal.     Palpations: Abdomen is soft.     Tenderness: There is no abdominal tenderness. There is no guarding.  Musculoskeletal:        General: No swelling.     Right lower leg: No edema.     Left lower leg: No edema.  Neurological:     Mental Status: He is alert and oriented to person, place, and time.  Psychiatric:  Mood and Affect: Mood normal.        Behavior: Behavior normal.     Assessment & Plan:   See Encounters Tab for problem based charting.  Patient discussed with Dr.  Cain Sieve

## 2021-08-30 NOTE — Assessment & Plan Note (Addendum)
Currently on metformin 1000 mg BID and Novolog 10U with meals. He has been compliant with his medications. Last A1c of 6.9. Will reevaluate his sugars today.  - A1c - Continue current regimen  Addendum:  Spoke with patient about results, no changes at this time. A1c of 6.2.

## 2021-08-30 NOTE — Assessment & Plan Note (Signed)
Vitals with BMI 08/28/2021 08/28/2021 06/17/2021  Height - 6' 0"  -  Weight - 265 lbs 3 oz -  BMI - 10.25 -  Systolic [phone]  Diastolic 95 92 92  Pulse 353 101 89   Patient presents today for reevaluation of his uncontrolled HTN. Currently on Losartan 50 mg and amlodipine 10 mg. Will increase his losartan today and follow up in two weeks.  - Losartan 100 mg Daily - Amlodipine 10 mg daily  - BMP next visit

## 2021-09-01 LAB — POCT GLYCOSYLATED HEMOGLOBIN (HGB A1C): Hemoglobin A1C: 6.2 % — AB (ref 4.0–5.6)

## 2021-09-01 NOTE — Progress Notes (Signed)
Internal Medicine Clinic Attending  Case discussed with Dr. Gilford Rile  At the time of the visit.  We reviewed the residents history and exam and pertinent patient test results.  I agree with the assessment, diagnosis, and plan of care documented in the residents note.

## 2021-09-08 ENCOUNTER — Other Ambulatory Visit: Payer: Self-pay | Admitting: Internal Medicine

## 2021-09-08 DIAGNOSIS — G8929 Other chronic pain: Secondary | ICD-10-CM

## 2021-09-09 NOTE — Telephone Encounter (Signed)
Reviewed PDMP, refill appropriate.

## 2021-09-11 ENCOUNTER — Other Ambulatory Visit: Payer: Self-pay | Admitting: Student

## 2021-09-11 DIAGNOSIS — G8929 Other chronic pain: Secondary | ICD-10-CM

## 2021-09-15 ENCOUNTER — Other Ambulatory Visit: Payer: Self-pay | Admitting: Internal Medicine

## 2021-09-15 DIAGNOSIS — E119 Type 2 diabetes mellitus without complications: Secondary | ICD-10-CM

## 2021-09-16 NOTE — Telephone Encounter (Signed)
PDMP reviewed.

## 2021-09-24 ENCOUNTER — Other Ambulatory Visit: Payer: Self-pay | Admitting: Internal Medicine

## 2021-09-24 DIAGNOSIS — J449 Chronic obstructive pulmonary disease, unspecified: Secondary | ICD-10-CM

## 2021-09-25 DIAGNOSIS — M7918 Myalgia, other site: Secondary | ICD-10-CM | POA: Diagnosis not present

## 2021-09-25 DIAGNOSIS — M5030 Other cervical disc degeneration, unspecified cervical region: Secondary | ICD-10-CM | POA: Diagnosis not present

## 2021-09-25 DIAGNOSIS — M4802 Spinal stenosis, cervical region: Secondary | ICD-10-CM | POA: Diagnosis not present

## 2021-09-25 DIAGNOSIS — M5459 Other low back pain: Secondary | ICD-10-CM | POA: Diagnosis not present

## 2021-09-25 DIAGNOSIS — M47812 Spondylosis without myelopathy or radiculopathy, cervical region: Secondary | ICD-10-CM | POA: Diagnosis not present

## 2021-09-25 DIAGNOSIS — M47816 Spondylosis without myelopathy or radiculopathy, lumbar region: Secondary | ICD-10-CM | POA: Diagnosis not present

## 2021-09-25 DIAGNOSIS — G8929 Other chronic pain: Secondary | ICD-10-CM | POA: Diagnosis not present

## 2021-09-26 ENCOUNTER — Encounter: Payer: Self-pay | Admitting: Student

## 2021-09-26 ENCOUNTER — Ambulatory Visit (INDEPENDENT_AMBULATORY_CARE_PROVIDER_SITE_OTHER): Payer: Medicare HMO | Admitting: Student

## 2021-09-26 VITALS — BP 137/79 | HR 99 | Temp 98.2°F | Ht 72.0 in | Wt 258.3 lb

## 2021-09-26 DIAGNOSIS — I10 Essential (primary) hypertension: Secondary | ICD-10-CM

## 2021-09-26 DIAGNOSIS — R918 Other nonspecific abnormal finding of lung field: Secondary | ICD-10-CM

## 2021-09-26 DIAGNOSIS — E119 Type 2 diabetes mellitus without complications: Secondary | ICD-10-CM

## 2021-09-26 DIAGNOSIS — E0590 Thyrotoxicosis, unspecified without thyrotoxic crisis or storm: Secondary | ICD-10-CM

## 2021-09-26 DIAGNOSIS — J449 Chronic obstructive pulmonary disease, unspecified: Secondary | ICD-10-CM

## 2021-09-26 DIAGNOSIS — F319 Bipolar disorder, unspecified: Secondary | ICD-10-CM | POA: Diagnosis not present

## 2021-09-26 DIAGNOSIS — M5412 Radiculopathy, cervical region: Secondary | ICD-10-CM | POA: Diagnosis not present

## 2021-09-26 DIAGNOSIS — Z720 Tobacco use: Secondary | ICD-10-CM | POA: Diagnosis not present

## 2021-09-26 DIAGNOSIS — M47812 Spondylosis without myelopathy or radiculopathy, cervical region: Secondary | ICD-10-CM

## 2021-09-26 NOTE — Assessment & Plan Note (Signed)
Patient reports he has been smoking tobacco since he was 61 years old. He smokes about 3/4th of a pack per day.  He continues to have ongoing chronic cough. Patient counseled on importance of smoking cessation in decreasing his risk of lung cancer and improving his symptoms.  States he is not currently ready to quit but will continue to work on it.  Patient is most overdue for lung cancer screening. -- Lung cancer low-dose CT scan

## 2021-09-26 NOTE — Assessment & Plan Note (Addendum)
Patient presents today for 4-week follow-up on his BP that his losartan dose was increased from 50 to 100 mg daily.  Patient's BP has improved to systolic in the 711A today.  He denies any headaches, blurred vision or dizziness.  Still endorse chronic neck and back pain.  We will get a BMP and keep patient on current regimen for now. Vitals:   09/26/21 0930  BP: 137/79   Plan: -- Continue losartan 100 mg daily -- Continue amlodipine 10 mg daily -- Follow-up CMP -- Follow-up with PCP in 3 months  Addendum: CMP showed normal kidney function and LFTs.  Mild hypercalcemia of 10.5, corrected to 9.9 with albumin of 4.8.

## 2021-09-26 NOTE — Assessment & Plan Note (Signed)
Patient reports ongoing neck and back pain. He saw the neurosurgeon, Dr. Francesco Runner, yesterday. He was prescribed a short course of Robaxin and Mobic. Patient requesting referral to PT today with plan to consider dry needling by NSG if physical therapy does not help.   Plan: -- PT referral today -- Continue to follow with neurosurgery -- Continue a short course of Robaxin and Mobic

## 2021-09-26 NOTE — Assessment & Plan Note (Addendum)
Likely secondary to long-term lithium use. Patient denies any fatigue or symptoms consistent with hyper or hypothyroidism. TSH level but free T4 was normal 8 months ago. -- Repeat TSH today  Addendum: Patient's TSH level significantly low at <0.005. Patient was called to discuss results. Per spouse, patient has been more anxious and fatigued lately. 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 Will have patient come in for lab work to check free T4 and T3. -- Future lab order for Free T4, T3

## 2021-09-26 NOTE — Progress Notes (Signed)
° °  CC: HTN follow up   HPI:  [name] is a 61 y.o. male with PMH as below who presents to clinic today accompanied by partner to follow-up on his blood pressure. Please see problem based charting for evaluation, assessment and plan.  Past Medical History:  Diagnosis Date   Beta-hemolytic group A streptococcal sepsis ([place])    Bipolar I disorder (HCC) 1991   Dr. Toy Care   Borderline hyperlipidemia    history of   Childhood asthma    COPD (chronic obstructive pulmonary disease) ([place])    Diabetes mellitus, new onset ([place]) 12/22/2017   Elevated glucose    Fibromyalgia    GERD (gastroesophageal reflux disease)    HTN (hypertension)    Low vitamin B12 level 06/07/2018   Lower back pain    Lumbar vertebral fracture (HCC) 01/29/2019   L1   Paranoia ([place])    Tobacco abuse    Tobacco abuse    Vitamin D deficiency     Review of Systems:  Constitutional: Negative for fever or chills Respiratory: Positive for cough. Negative for shortness of breath or wheezing MSK: Positive for chronic back and neck pain Neuro: Negative for headache, dizziness or weakness  Physical Exam: General: Pleasant, well-appearing elderly male. No acute distress. Cardiac: RRR. No murmurs, rubs or gallops. No LE edema Respiratory: Lungs CTAB.  Decreased air movement.  No wheezing, rhonchi or crackles. Abdominal: Soft, symmetric and non tender. Normal BS. Skin: Warm, dry and intact without rashes or lesions Extremities: Atraumatic. Full ROM. 2+ radial pulses. Neuro: A&O x 3. Moves all extremities. Normal sensation to gross touch. Psych: Appropriate mood and affect.  Vitals:   09/26/21 0930  BP: 137/79  Pulse: 99  Temp: 98.2 F (36.8 C)  TempSrc: Oral  SpO2: 97%  Weight: 258 lb 4.8 oz (117.2 kg)  Height: 6' (1.829 m)    Assessment & Plan:   See Encounters Tab for problem based charting.  Patient discussed with Dr. Michelle Nasuti, MD, MPH

## 2021-09-26 NOTE — Patient Instructions (Signed)
Thank you, [name] for allowing [place] to provide your care today. Today we discussed your blood pressure, chronic pain, ongoing cough and tobacco use.   For your blood pressure: Continue taking your medications as prescribed.  For your COPD: I am referring you to a pulmonologist.  I have also ordered a lung cancer screening CT scan.  I recommend that you work on quitting tobacco use to help decrease your risk of lung cancer and improve your cough.  We have medications available to help you quit when you are ready.   I have ordered the following labs for you:  Lab Orders         CMP14 + Anion Gap         TSH       I will call if any are abnormal. All of your labs can be accessed through "My Chart".  I have place a referrals to physical therapy and pulmonology  I have ordered the following tests: Low-dose CT scan lungs  My Chart Access: [email or web address]  Please follow-up in 3 months  Please make sure to arrive 15 minutes prior to your next appointment. If you arrive late, you may be asked to reschedule.    We look forward to seeing you next time. Please call our clinic at [phone] if you have any questions or concerns. The best time to call is Monday-Friday from 9am-4pm, but there is someone available 24/7. If after hours or the weekend, call the main hospital number and ask for the Internal Medicine Resident On-Call. If you need medication refills, please notify your pharmacy one week in advance and they will send [place] a request.   Thank you for letting us take part in your care. Wishing you the best!  Lacinda Axon, MD 09/26/2021, 10:01 AM IM Resident, PGY-2 Jesus Martin 41:10

## 2021-09-26 NOTE — Assessment & Plan Note (Signed)
Patient's diabetes is well controlled with A1c of 6.2%. He reports that he is taking the NovoLog 15 units twice a day instead of 10 units 3 times a day.  His blood sugars range from 110s in the morning to 160s during the day.  Discussed with patient about possibly going down on his NovoLog or switching to a longer acting insulin. Patient states that he has been on his current regimen for at least a yearall , he has been doing well and wants to continue for now.  -- Discussed weaning patient off insulin at next office visit --A1c in 3 months -- Continue metformin 1000 mg twice daily

## 2021-09-26 NOTE — Assessment & Plan Note (Signed)
Last low-dose CT scan 06/2020 showed triangular subpleural anterior right lower lobe solid pulmonary nodule measuring 5.2 mm in volume derived mean diameter.  Patient continues to smoke significant amount of tobacco. -- Repeat low-dose CT scan for lung cancer screening

## 2021-09-26 NOTE — Assessment & Plan Note (Addendum)
Patient reports his mood has been stable on lithium for more than 30 years. We will repeat CMP and TSH today to check liver and thyroid function. Prefers labs to be faxed to his psychiatrist. -- Continue on lithium, Lamictal and Seroquel -- Check CMP and TSH -- Consider checking lithium level at next OV

## 2021-09-26 NOTE — Assessment & Plan Note (Signed)
Patient reports ongoing cough. Cough is usually dry but occasionally with clear sputum. He continues to smoke 3/4 pack of cigarettes per day. Denies any fevers, chills, shortness of breath, headaches, sinus pain. He has tried Mucinex without significant relief. There is no wheezing or rhonchi on lung auscultation. Patient counseled on importance of smoking cessation to help improve symptoms. Symptoms are not consistent with COPD exacerbation, pneumonia or bacteria sinusitis. Patient advised to call clinic if his symptoms worsen.  -- Advised to stop smoking, continue to discuss this at his next office visit -- Refer to pulmonology to evaluate for possible progression of his COPD -- Continue Trelegy

## 2021-09-27 LAB — CMP14 + ANION GAP
ALT: 35 IU/L (ref 0–44)
AST: 27 IU/L (ref 0–40)
Albumin/Globulin Ratio: 2 (ref 1.2–2.2)
Albumin: 4.8 g/dL (ref 3.8–4.9)
Alkaline Phosphatase: 80 IU/L (ref 44–121)
Anion Gap: 16 mmol/L (ref 10.0–18.0)
BUN/Creatinine Ratio: 15 (ref 10–24)
BUN: 14 mg/dL (ref 8–27)
Bilirubin Total: 0.3 mg/dL (ref 0.0–1.2)
CO2: 23 mmol/L (ref 20–29)
Calcium: 10.5 mg/dL — ABNORMAL HIGH (ref 8.6–10.2)
Chloride: 102 mmol/L (ref 96–106)
Creatinine, Ser: 0.96 mg/dL (ref 0.76–1.27)
Globulin, Total: 2.4 g/dL (ref 1.5–4.5)
Glucose: 157 mg/dL — ABNORMAL HIGH (ref 70–99)
Potassium: 4.9 mmol/L (ref 3.5–5.2)
Sodium: 141 mmol/L (ref 134–144)
Total Protein: 7.2 g/dL (ref 6.0–8.5)
eGFR: 90 mL/min/{1.73_m2} (ref >59)

## 2021-09-27 LAB — TSH: TSH: <0.005 u[IU]/mL — ABNORMAL LOW (ref 0.450–4.500)

## 2021-09-29 NOTE — Addendum Note (Signed)
Addended byLinwood Dibbles on: 09/29/2021 06:24 PM   Modules accepted: Orders

## 2021-10-02 NOTE — Progress Notes (Signed)
Internal Medicine Clinic Attending  Case discussed with Dr. Coy Saunas  at the time of the visit.  We reviewed the residents history and exam and pertinent patient test results.  I agree with the assessment, diagnosis, and plan of care documented in the residents note.

## 2021-10-03 ENCOUNTER — Other Ambulatory Visit (INDEPENDENT_AMBULATORY_CARE_PROVIDER_SITE_OTHER): Payer: Medicare HMO

## 2021-10-03 DIAGNOSIS — E0590 Thyrotoxicosis, unspecified without thyrotoxic crisis or storm: Secondary | ICD-10-CM | POA: Diagnosis not present

## 2021-10-04 LAB — T4, FREE: Free T4: 1.29 ng/dL (ref 0.82–1.77)

## 2021-10-04 LAB — T3: T3, Total: 136 ng/dL (ref 71–180)

## 2021-10-08 ENCOUNTER — Other Ambulatory Visit: Payer: Self-pay | Admitting: Internal Medicine

## 2021-10-08 DIAGNOSIS — E119 Type 2 diabetes mellitus without complications: Secondary | ICD-10-CM

## 2021-10-08 DIAGNOSIS — G8929 Other chronic pain: Secondary | ICD-10-CM

## 2021-10-08 NOTE — Therapy (Signed)
OUTPATIENT PHYSICAL THERAPY CERVICAL EVALUATION  Patient Name: Jesus Martin MRN: [id number] DOB:March 20, 1961, 61 y.o., male Today's Date: 10/09/2021   PT End of Session - 10/09/21 0933     Visit Number 1    Number of Visits 16    Date for PT Re-Evaluation 12/04/21    Authorization Type [employer]    PT Start Time 0932    PT Stop Time 1020    PT Time Calculation (min) 48 min    Activity Tolerance Patient tolerated treatment well    Behavior During Therapy [facility] for tasks assessed/performed             Past Medical History:  Diagnosis Date   Beta-hemolytic group A streptococcal sepsis ([place])    Bipolar I disorder ([place]) 1991   Dr. Toy Care   Borderline hyperlipidemia    history of   Childhood asthma    COPD (chronic obstructive pulmonary disease) ([place])    Diabetes mellitus, new onset ([place]) 12/22/2017   Elevated glucose    Fibromyalgia    GERD (gastroesophageal reflux disease)    HTN (hypertension)    Low vitamin B12 level 06/07/2018   Lower back pain    Lumbar vertebral fracture ([place]) 01/29/2019   L1   Paranoia ([place])    Tobacco abuse    Tobacco abuse    Vitamin D deficiency    Past Surgical History:  Procedure Laterality Date   [place]   NOSE SURGERY     Patient Active Problem List   Diagnosis Date Noted   Cervical spondylosis 02/27/2021   Rash 07/10/2020   Subclinical hyperthyroidism 04/25/2020   Tobacco use 04/16/2020   Multiple pulmonary nodules 04/16/2020   Fatigue 04/16/2020   Chest pain 12/12/2019   Compression fracture of spine ([place]) 01/30/2019   Hyperlipidemia 09/26/2018   Foot pain, bilateral 09/26/2018   Diabetic neuropathy ([place]) 12/22/2017   Chronic pain 11/24/2017   Hepatic steatosis 11/24/2017   COPD (chronic obstructive pulmonary disease) ([place]) 11/24/2017   Diabetes ([place])    Bipolar 1 disorder ([place]) 04/17/2011   Hypertension 04/17/2011    PCP: Christiana Fuchs, DO  REFERRING PROVIDER: Axel Filler  REFERRING DIAG: [phone] (ICD-10-CM) - Cervical spondylosis M54.12 (ICD-10-CM) - Cervical radiculopathy   THERAPY DIAG:  Cervicalgia  Muscle weakness (generalized)  Chronic left shoulder pain  Chronic right shoulder pain  ONSET DATE: 1 year ago  SUBJECTIVE:  SUBJECTIVE STATEMENT: Patient presents with wife for PT evaluation for chronic neck and shoulder pain which is chronic  and > 1 yr duration.  He has been told to do PT for some time but states he couldn't afford it.  He presents with stiffness in his neck, difficulty with mobility tasks including UE use.  "When I am trying to relax I can't get it comfortable most of the time".  He has pain that travels down to his shoulder blade and low back, mostly on L side but also the Rt.  He denies sensory changes, weakness or radicular symptoms.  He does not sleep well due to pain .  He does not lift routinely due to low back injury.  His wife does not let him drive due to medication and side effects.  Neurosurgeon requesting dry needling per referral.    PERTINENT HISTORY:  cervical spondylosis, arthropathy of cervical facet joint, and axial and myofascial pain Lumbar fracture with kyphoplasty (about 3 yrs ago) Diabetes, HTN, COPD Bipolar disorder   Are you having pain? Yes NPRS scale: 6/10, sometimes it goes to 8/10 Pain location: post neck and shoulder, L upper trap to central  Pain orientation: Left, Bilateral, Posterior, and Upper  PAIN TYPE: aching and stabbing and pressure Pain description: constant and stabbing  Aggravating factors: turning his head  Relieving factors: limited relief with meds or any other measures   PRECAUTIONS: Other: none  WEIGHT BEARING RESTRICTIONS No  FALLS:  Has patient fallen in last 6 months? No,  Number of falls: 0  LIVING ENVIRONMENT: Lives with: lives with their family Lives in: House/apartment Stairs: No; Internal: 0 steps; ramp Has following equipment at home: Single point cane and Walker - 2 wheeled, does not use this  He and his wife have taken on 3 children ages 39, 38 and 18   OCCUPATION: on disability  PLOF: Independent  PATIENT GOALS : To feel better , avoid surgery and the spinal injections    OBJECTIVE:   DIAGNOSTIC FINDINGS:  Cervical spondylosis as outlined.   Levels of mild spinal canal narrowing. Most notably at C4-C5, a  small central disc protrusion contributes to mild spinal canal  narrowing, and may contact the ventral spinal cord.   Multilevel foraminal stenosis, as detailed and greatest on the right  at C2-C3 (moderate), on the right at C4-C5 (moderate/severe),  bilaterally at C5-C6 (severe right, moderate/severe left) and  bilaterally at C6-C7 (severe right, moderate left).   Disc degeneration is greatest at C5-C6 (moderate) and C6-C7  (moderate to moderately advanced). Associated mild degenerative  endplate edema at these levels.   Straightening of the expected cervical lordosis.    PATIENT SURVEYS:  FOTO 51%   COGNITION:  Overall cognitive status: Within functional limits for tasks assessed     SENSATION:  Light touch: Appears intact  Stereognosis: Appears intact  Hot/Cold: Appears intact  Proprioception: Appears intact  POSTURE:  Forward head, scapula abducted from midline and GH joint internal rotation  PALPATION: Pain along bilateral occipitals and extending laterally to upper traps  CERVICAL AROM/PROM  A/PROM A/PROM (deg) 09/23/2021  Flexion 25  Extension 30  Right lateral flexion 20  Left lateral flexion 20  Right rotation 40  Left rotation 40   (Blank rows = not tested)  UE AROM/PROM:  A/PROM Right 09/23/2021 Left 09/23/2021  Shoulder flexion AROM 110 approx.  AROM 110 approx.   Shoulder extension    Shoulder  abduction 100 approx.  100 approx   Shoulder  adduction    Shoulder extension    Shoulder internal rotation pain Pain   Shoulder external rotation pain Pain    (Blank rows = not tested)  UE MMT:  MMT Right 09/23/2021 Left 09/23/2021  Shoulder flexion 5/5 5/5  Shoulder extension    Shoulder abduction 4+/5 4+/5  Shoulder adduction    Shoulder extension    Shoulder internal rotation    Shoulder external rotation    Middle trapezius    Lower trapezius    Elbow flexion 5/5 5/5  Elbow extension 4/5 4/5  Wrist flexion    Wrist extension    Wrist ulnar deviation    Wrist radial deviation    Wrist pronation    Wrist supination    Grip strength 46 lb due to arthritis  75 lbs   (Blank rows = not tested)  CERVICAL SPECIAL TESTS:  NT     LUMBAR SPECIAL TESTS:  NT  FUNCTIONAL TESTS:  NT  GAIT: Distance walked: 150 Assistive device utilized: None Level of assistance: Modified independence Comments: not assessed    TODAY'S TREATMENT  PT eval, HEP and prognosis Brief manual therapy for reducing tension along posterior cervicals, light and gentle suboccipital release and PROM rotation, lateral sidebending    PATIENT EDUCATION:  Education details: PT/POC, HEP posture and Dry needling  Person educated: Patient and Spouse Education method: Explanation, Demonstration, and Handouts Education comprehension: verbalized understanding, verbal cues required, and needs further education   HOME EXERCISE PROGRAM: Access Code: [email or web address] URL: [email or web address] Date: 10/09/2021 Prepared by: Raeford Razor  Exercises Supine Cervical Retraction with Towel - 2-3 x daily - 7 x weekly - 2 sets - 10 reps - 5 hold Seated Scapular Retraction - 2-3 x daily - 7 x weekly - 2 sets - 10 reps - 5 hold Corner Pec Major Stretch - 2 x daily - 7 x weekly - 1 sets - 5 reps - 30 hold   ASSESSMENT:  CLINICAL IMPRESSION: Patient is a 61  y.o. male who was seen today for physical  therapy evaluation and treatment for neck pain. His symptoms are becoming more consistent and is experiencing increased stiffness in neck , pain radiating to mid and low back.  He is hopeful he can complete PT and avoid surgery and other invasive interventions.    OBJECTIVE IMPAIRMENTS decreased activity tolerance, decreased endurance, decreased mobility, difficulty walking, decreased ROM, decreased strength, hypomobility, increased fascial restrictions, impaired flexibility, impaired UE functional use, improper body mechanics, postural dysfunction, obesity, and pain.   ACTIVITY LIMITATIONS cleaning, community activity, driving, yard work, and yard work.   PERSONAL FACTORS Past/current experiences, Time since onset of injury/illness/exacerbation, and 3+ comorbidities: diabetes, bipolar and previous lumbar pain   are also affecting patient's functional outcome.    REHAB POTENTIAL: Good  CLINICAL DECISION MAKING: Evolving/moderate complexity  EVALUATION COMPLEXITY: Moderate   GOALS: Goals reviewed with patient? Yes  SHORT TERM GOALS:  STG Name Target Date Goal status  1 Pt will be I with HEP for cervical AROM, posture and UE strength  Baseline: unknown  11/06/2021 INITIAL  2 Pt will experience less pain at rest, typically 3/10 at rest with head support Baseline: 5/10  11/06/2021 INITIAL  3 Pt will be able to lift arm(s) to above shoulder height without increased neck pain  Baseline: 11/06/2021 INITIAL  4 Pt will understand FOTO and potential for positive outcome  Baseline: 11/06/2021 INITIAL  LONG TERM GOALS:   LTG Name Target Date Goal status  1 Pt will be able to show cervical AROM improved by 10-15 deg in each direction for general ease of movement and interaction with environment Baseline: 12/04/2021 INITIAL  2 Pt will be able to demo 5/5 strength in UE for maximal neck support  Baseline:  12/04/2021 INITIAL  3 Pt will be able to improve sleep quality and bed  mobility due to less pain,  50% better or more  Baseline extremely difficult, wakes due to pain frequently 12/04/2021 INITIAL  4 Pt will be able to score 57% or better on FOTO Baseline: 51% 12/04/2021 INITIAL  5 Pt will be able to use arms for overhead reaching without increasing neck pain for most household tasks.  Baseline: pain just above shoulder height 12/04/2021 INITIAL             PLAN: PT FREQUENCY: 2x/week  PT DURATION: 8 weeks  PLANNED INTERVENTIONS: Therapeutic exercises, Therapeutic activity, Neuromuscular re-education, Balance training, Gait training, Patient/Family education, Joint mobilization, Dry Needling, Electrical stimulation, Cryotherapy, Moist heat, and Manual therapy  PLAN FOR NEXT SESSION: check HEP, manual, begin posture and upper back strengthening with light to med bands, mhp?  Referring diagnosis?  P77.939 (ICD-10-CM) - Cervical spondylosis  M54.12 (ICD-10-CM) - Cervical radiculopathy   Treatment diagnosis? (if different than referring diagnosis) cervicalgia What was this (referring dx) caused by? []  Surgery []  Fall [x]  Ongoing issue [x]  Arthritis []  Other: ____________  Laterality: []  Rt []  Lt [x]  Both  Check all possible CPT codes:  *CHOOSE 10 OR LESS*    [x]  97110 (Therapeutic Exercise)  []  92507 (SLP Treatment)  [x]  97112 (Neuro Re-ed)   []  92526 (Swallowing Treatment)   []  97116 (Gait Training)   []  [id number] (Cognitive Training, 1st 15 minutes) [x]  97140 (Manual Therapy)   []  97130 (Cognitive Training, each add'l 15 minutes)  [x]  97530 (Therapeutic Activities)  []  Other, List CPT Code ____________    [x]  68864 (Self Care)       []  All codes above (97110 - 97535)  [x]  97012 (Mechanical Traction)  [x]  97014 (E-stim Unattended)  []  97032 (E-stim manual)  []  97033 (Ionto)  []  97035 (Ultrasound)  []  97760 (Orthotic Fit) [x]  97750 (Physical Performance Training) []  [id number] (Aquatic Therapy) []  97034 (Contrast Bath) []  [id number] (Paraffin) []  97597  (Wound Care 1st 20 sq cm) []  97598 (Wound Care each add'l 20 sq cm) []  97016 (Vasopneumatic Device) []  [id number] (Orthotic Training) []  [id number] (Prosthetic Training)    Raeford Razor, PT 10/09/21 10:57 AM Phone: [phone] Fax: [phone]  Esequiel Kleinfelter, PT 10/09/2021, 10:17 AM

## 2021-10-09 ENCOUNTER — Other Ambulatory Visit: Payer: Self-pay | Admitting: Internal Medicine

## 2021-10-09 ENCOUNTER — Ambulatory Visit
Payer: Medicare HMO | Attending: Student in an Organized Health Care Education/Training Program | Admitting: Physical Therapy

## 2021-10-09 ENCOUNTER — Other Ambulatory Visit: Payer: Self-pay

## 2021-10-09 ENCOUNTER — Encounter: Payer: Self-pay | Admitting: Physical Therapy

## 2021-10-09 DIAGNOSIS — M25511 Pain in right shoulder: Secondary | ICD-10-CM | POA: Diagnosis not present

## 2021-10-09 DIAGNOSIS — G8929 Other chronic pain: Secondary | ICD-10-CM | POA: Diagnosis not present

## 2021-10-09 DIAGNOSIS — M5412 Radiculopathy, cervical region: Secondary | ICD-10-CM | POA: Diagnosis not present

## 2021-10-09 DIAGNOSIS — M6281 Muscle weakness (generalized): Secondary | ICD-10-CM | POA: Insufficient documentation

## 2021-10-09 DIAGNOSIS — M47812 Spondylosis without myelopathy or radiculopathy, cervical region: Secondary | ICD-10-CM | POA: Insufficient documentation

## 2021-10-09 DIAGNOSIS — M542 Cervicalgia: Secondary | ICD-10-CM | POA: Diagnosis not present

## 2021-10-09 DIAGNOSIS — M25512 Pain in left shoulder: Secondary | ICD-10-CM

## 2021-10-11 ENCOUNTER — Other Ambulatory Visit: Payer: Self-pay | Admitting: Internal Medicine

## 2021-10-11 DIAGNOSIS — I10 Essential (primary) hypertension: Secondary | ICD-10-CM

## 2021-10-14 ENCOUNTER — Ambulatory Visit: Payer: Medicare HMO | Admitting: Physical Therapy

## 2021-10-16 ENCOUNTER — Ambulatory Visit: Payer: Medicare HMO | Admitting: Physical Therapy

## 2021-10-16 ENCOUNTER — Other Ambulatory Visit: Payer: Self-pay

## 2021-10-16 ENCOUNTER — Encounter: Payer: Self-pay | Admitting: Physical Therapy

## 2021-10-16 DIAGNOSIS — M6281 Muscle weakness (generalized): Secondary | ICD-10-CM | POA: Diagnosis not present

## 2021-10-16 DIAGNOSIS — M25512 Pain in left shoulder: Secondary | ICD-10-CM | POA: Diagnosis not present

## 2021-10-16 DIAGNOSIS — M5412 Radiculopathy, cervical region: Secondary | ICD-10-CM | POA: Diagnosis not present

## 2021-10-16 DIAGNOSIS — M25511 Pain in right shoulder: Secondary | ICD-10-CM | POA: Diagnosis not present

## 2021-10-16 DIAGNOSIS — M542 Cervicalgia: Secondary | ICD-10-CM

## 2021-10-16 DIAGNOSIS — G8929 Other chronic pain: Secondary | ICD-10-CM | POA: Diagnosis not present

## 2021-10-16 DIAGNOSIS — M47812 Spondylosis without myelopathy or radiculopathy, cervical region: Secondary | ICD-10-CM | POA: Diagnosis not present

## 2021-10-16 NOTE — Therapy (Signed)
OUTPATIENT PHYSICAL THERAPY TREATMENT NOTE   Patient Name: Jesus Martin MRN: [id number] DOB:August 18, 1960, 61 y.o., male Today's Date: 10/16/2021  PCP: Christiana Fuchs, DO REFERRING PROVIDER: Axel Filler   PT End of Session - 10/16/21 1104     Visit Number 2    Number of Visits 16    Date for PT Re-Evaluation 12/04/21    Authorization Type [employer]    PT Start Time 22    PT Stop Time 1143    PT Time Calculation (min) 43 min             Past Medical History:  Diagnosis Date   Beta-hemolytic group A streptococcal sepsis ([place])    Bipolar I disorder ([place]) 1991   Dr. Toy Care   Borderline hyperlipidemia    history of   Childhood asthma    COPD (chronic obstructive pulmonary disease) ([place])    Diabetes mellitus, new onset ([place]) 12/22/2017   Elevated glucose    Fibromyalgia    GERD (gastroesophageal reflux disease)    HTN (hypertension)    Low vitamin B12 level 06/07/2018   Lower back pain    Lumbar vertebral fracture ([place]) 01/29/2019   L1   Paranoia ([place])    Tobacco abuse    Tobacco abuse    Vitamin D deficiency    Past Surgical History:  Procedure Laterality Date   [place]   NOSE SURGERY     Patient Active Problem List   Diagnosis Date Noted   Cervical spondylosis 02/27/2021   Rash 07/10/2020   Subclinical hyperthyroidism 04/25/2020   Tobacco use 04/16/2020   Multiple pulmonary nodules 04/16/2020   Fatigue 04/16/2020   Chest pain 12/12/2019   Compression fracture of spine ([place]) 01/30/2019   Hyperlipidemia 09/26/2018   Foot pain, bilateral 09/26/2018   Diabetic neuropathy ([place]) 12/22/2017   Chronic pain 11/24/2017   Hepatic steatosis 11/24/2017   COPD (chronic obstructive pulmonary disease) ([place]) 11/24/2017   Diabetes ([place])    Bipolar 1 disorder ([place]) 04/17/2011   Hypertension 04/17/2011   PCP: Christiana Fuchs, DO   REFERRING PROVIDER: Axel Filler   REFERRING DIAG: [phone]  (ICD-10-CM) - Cervical spondylosis M54.12 (ICD-10-CM) - Cervical radiculopathy   THERAPY DIAG:  Cervicalgia  Muscle weakness (generalized)  Chronic left shoulder pain  Chronic right shoulder pain  PERTINENT HISTORY: cervical spondylosis, arthropathy of cervical facet joint, and axial and myofascial pain Lumbar fracture with kyphoplasty (about 3 yrs ago) Diabetes, HTN, COPD Bipolar disorder  PRECAUTIONS: None  SUBJECTIVE: I have been doing some of the exercises. I am about the same.   PAIN:  Are you having pain? Yes NPRS scale: 6/10,  Pain location: post neck and shoulder, L upper trap to central  Pain orientation: Left, Bilateral, Posterior, and Upper  PAIN TYPE: aching and stabbing and pressure Pain description: constant and stabbing  Aggravating factors: turning his head  Relieving factors: limited relief with meds or any other measures        OBJECTIVE:    DIAGNOSTIC FINDINGS:  Cervical spondylosis as outlined.   Levels of mild spinal canal narrowing. Most notably at C4-C5, a  small central disc protrusion contributes to mild spinal canal  narrowing, and may contact the ventral spinal cord.   Multilevel foraminal stenosis, as detailed and greatest on the right  at C2-C3 (moderate), on the right at C4-C5 (moderate/severe),  bilaterally at C5-C6 (severe right, moderate/severe left) and  bilaterally at C6-C7 (severe right, moderate left).   Disc degeneration is greatest at C5-C6 (moderate) and C6-C7  (moderate to moderately advanced). Associated mild degenerative  endplate edema at these levels.   Straightening of the expected cervical lordosis.     PATIENT SURVEYS:  FOTO 51%     COGNITION:          Overall cognitive status: Within functional limits for tasks assessed                        SENSATION:          Light touch: Appears intact          Stereognosis: Appears intact          Hot/Cold: Appears intact          Proprioception: Appears intact    POSTURE:  Forward head, scapula abducted from midline and GH joint internal rotation   PALPATION: Pain along bilateral occipitals and extending laterally to upper traps   CERVICAL AROM/PROM   A/PROM A/PROM (deg) 09/23/2021  Flexion 25  Extension 30  Right lateral flexion 20  Left lateral flexion 20  Right rotation 40  Left rotation 40   (Blank rows = not tested)   UE AROM/PROM:   A/PROM Right 09/23/2021 Left 09/23/2021  Shoulder flexion AROM 110 approx.  AROM 110 approx.   Shoulder extension      Shoulder abduction 100 approx.  100 approx   Shoulder adduction      Shoulder extension      Shoulder internal rotation pain Pain   Shoulder external rotation pain Pain    (Blank rows = not tested)   UE MMT:   MMT Right 09/23/2021 Left 09/23/2021  Shoulder flexion 5/5 5/5  Shoulder extension      Shoulder abduction 4+/5 4+/5  Shoulder adduction      Shoulder extension      Shoulder internal rotation      Shoulder external rotation      Middle trapezius      Lower trapezius      Elbow flexion 5/5 5/5  Elbow extension 4/5 4/5  Wrist flexion      Wrist extension      Wrist ulnar deviation      Wrist radial deviation      Wrist pronation      Wrist supination      Grip strength 46 lb due to arthritis  75 lbs   (Blank rows = not tested)   CERVICAL SPECIAL TESTS:  NT      LUMBAR SPECIAL TESTS:  NT   FUNCTIONAL TESTS:  NT   GAIT: Distance walked: 150 Assistive device utilized: None Level of assistance: Modified independence Comments: not assessed   [facility] Adult PT Treatment:                                                DATE: 10/16/21 Therapeutic Exercise: Review of HEP: scap squeeze x 10, supine chin tuck  x 10, pec stetch 15 sec x 3 in doorway, min cues  Upper trap stretch x 3 Levator stretch x3 Supine cane press up with pullover x 10 -cues to keep comfortable Manual Therapy: STW to bilat upper/ mid traps, passive cervical rotation and side bend, levator  stretch Trigger Point Dry-Needling  Treatment instructions: Expect mild to  moderate muscle soreness. S/S of pneumothorax if dry needled over a lung field, and to seek immediate medical attention should they occur. Patient verbalized understanding of these instructions and education.  Patient Consent Given: Yes Education handout provided: Yes Muscles treated: bilat upper trap Electrical stimulation performed: No Parameters: N/A Treatment response/outcome: Twitch response elicited and Palpable decrease in muscle tension    TODAY'S TREATMENT  PT eval, HEP and prognosis Brief manual therapy for reducing tension along posterior cervicals, light and gentle suboccipital release and PROM rotation, lateral sidebending      PATIENT EDUCATION:  Education details: PT/POC, HEP posture and Dry needling  Person educated: Patient and Spouse Education method: Explanation, Demonstration, and Handouts Education comprehension: verbalized understanding, verbal cues required, and needs further education     HOME EXERCISE PROGRAM: Access Code: [email or web address] URL: [email or web address] Date: 10/16/2021 Prepared by: Hessie Diener  Exercises Supine Cervical Retraction with Towel - 2-3 x daily - 7 x weekly - 2 sets - 10 reps - 5 hold Seated Scapular Retraction - 2-3 x daily - 7 x weekly - 2 sets - 10 reps - 5 hold Corner Pec Major Stretch - 2 x daily - 7 x weekly - 1 sets - 5 reps - 30 hold Seated Upper Trapezius Stretch - 1 x daily - 7 x weekly - 1 sets - 3 reps - 15 hold Seated Levator Scapulae Stretch - 1 x daily - 7 x weekly - 1 sets - 3 reps - 15 hold      ASSESSMENT:   CLINICAL IMPRESSION: Patient reports that he is about the same. He has performed HEP intermittently. Hilda Blades, DPT available to complete TPDN to bilat upper traps. Educated pt on stretches for upper traps and levator and he was given an updated HEP. Reviewed his initial HEP and he required min cues. At end of session  he reported left upper trap was increased pain.     OBJECTIVE IMPAIRMENTS decreased activity tolerance, decreased endurance, decreased mobility, difficulty walking, decreased ROM, decreased strength, hypomobility, increased fascial restrictions, impaired flexibility, impaired UE functional use, improper body mechanics, postural dysfunction, obesity, and pain.    ACTIVITY LIMITATIONS cleaning, community activity, driving, yard work, and yard work.    PERSONAL FACTORS Past/current experiences, Time since onset of injury/illness/exacerbation, and 3+ comorbidities: diabetes, bipolar and previous lumbar pain   are also affecting patient's functional outcome.      REHAB POTENTIAL: Good   CLINICAL DECISION MAKING: Evolving/moderate complexity   EVALUATION COMPLEXITY: Moderate     GOALS: Goals reviewed with patient? Yes   SHORT TERM GOALS:   STG Name Target Date Goal status  1 Pt will be I with HEP for cervical AROM, posture and UE strength  Baseline: unknown  11/06/2021 INITIAL  2 Pt will experience less pain at rest, typically 3/10 at rest with head support Baseline: 5/10  11/06/2021 INITIAL  3 Pt will be able to lift arm(s) to above shoulder height without increased neck pain  Baseline: 11/06/2021 INITIAL  4 Pt will understand FOTO and potential for positive outcome  Baseline: 11/06/2021 INITIAL                               LONG TERM GOALS:    LTG Name Target Date Goal status  1 Pt will be able to show cervical AROM improved by 10-15 deg in each direction for general ease of movement and interaction with environment Baseline: 12/04/2021 INITIAL  2 Pt will be able to demo 5/5 strength in UE for maximal neck support  Baseline:  12/04/2021 INITIAL  3 Pt will be able to improve sleep quality and bed mobility due to less pain,  50% better or more  Baseline extremely difficult, wakes due to pain frequently 12/04/2021 INITIAL  4 Pt will be able to score 57% or better on FOTO Baseline: 51%  12/04/2021 INITIAL  5 Pt will be able to use arms for overhead reaching without increasing neck pain for most household tasks.  Baseline: pain just above shoulder height 12/04/2021 INITIAL                      PLAN: PT FREQUENCY: 2x/week   PT DURATION: 8 weeks   PLANNED INTERVENTIONS: Therapeutic exercises, Therapeutic activity, Neuromuscular re-education, Balance training, Gait training, Patient/Family education, Joint mobilization, Dry Needling, Electrical stimulation, Cryotherapy, Moist heat, and Manual therapy   PLAN FOR NEXT SESSION: assess response to TPDN, check HEP, manual, begin posture and upper back strengthening with light to med bands, mhp?   Hessie Diener, PTA 10/16/21 12:14 PM Phone: [phone] Fax: [phone]

## 2021-10-17 ENCOUNTER — Ambulatory Visit: Payer: Medicare HMO | Admitting: Emergency Medicine

## 2021-10-17 ENCOUNTER — Encounter: Payer: Self-pay | Admitting: Emergency Medicine

## 2021-10-17 DIAGNOSIS — Z720 Tobacco use: Secondary | ICD-10-CM

## 2021-10-17 DIAGNOSIS — J449 Chronic obstructive pulmonary disease, unspecified: Secondary | ICD-10-CM | POA: Diagnosis not present

## 2021-10-17 NOTE — Assessment & Plan Note (Signed)
We will perform pulmonary function testing in next office visit. ?We will perform lab work today (alpha-1 antitrypsin level) ?Please continue Trelegy 1 inhalation once daily.  Rinse and gargle after using. ?Keep albuterol available to use 2 puffs up to every 4 hours if needed for shortness of breath, chest tightness, wheezing.  ?Continue to work on walking, exercise, conditioning.  This will help her functional capacity. ?Follow with Dr. Lamonte Sakai next available with full pulmonary function testing on the same day.  ?

## 2021-10-17 NOTE — Assessment & Plan Note (Signed)
You need to cut down your cigarettes.  Ultimate goal will be to stop altogether.  Try to get down to 10 cigarettes daily by our next visit. ?Get your lung cancer screening CT scan this month as planned ?

## 2021-10-17 NOTE — Progress Notes (Signed)
Subjective:    Patient ID: Jesus Martin, male    DOB: 20-Oct-1960, 61 y.o.   MRN: [id number]  HPI 61 year old man, active smoker (55 pack years, currently 0.75 pk/day), childhood asthma with COPD.  PMH also significant for diabetes, hypertension, bipolar disorder on lithium, GERD, fibromyalgia, hyperthyroidism. He is referred today for his COPD, some progressive symptoms.  Has been managed on Trelegy. He reports that he has been experiencing increased exertional SOB over the last 4-5 months - walks his dog 2 miles, has had more SOB w this. Can do some woodworking, handyman tasks but more limited. Has had some increased cough, usually non-productive. Can hear wheeze, often at night when supine. He snores, loud, witnessed apneas - does not want a sleep eval. Has gained wt over the last 4 yrs. Using albuterol - about once a day for dyspnea.   Lung cancer screening CT in 06/21/2020 reviewed by me shows some mild centrilobular emphysema and diffuse bronchial wall thickening.  A an anterior right lower lobe triangular subpleural pulmonary nodule 5.2 mm.  RADS 2 study.  Pulmonary function testing 03/03/2017 reviewed by me, poor quality study due to patient's understanding of the instructions, shows spirometry consistent with mixed obstruction and restriction.  Flow volume loop with blunting of the inspiratory and expiratory curves could suggest fixed obstruction.  Lung volumes were not completed   Review of Systems As per HPI  Past Medical History:  Diagnosis Date   Beta-hemolytic group A streptococcal sepsis ([place])    Bipolar I disorder (HCC) 1991   Dr. Toy Care   Borderline hyperlipidemia    history of   Childhood asthma    COPD (chronic obstructive pulmonary disease) ([place])    Diabetes mellitus, new onset ([place]) 12/22/2017   Elevated glucose    Fibromyalgia    GERD (gastroesophageal reflux disease)    HTN (hypertension)    Low vitamin B12 level 06/07/2018   Lower back pain    Lumbar  vertebral fracture ([place]) 01/29/2019   L1   Paranoia ([place])    Tobacco abuse    Tobacco abuse    Vitamin D deficiency      Family History  Adopted: Yes     Social History   Socioeconomic History   Marital status: Married    Spouse name: Not on file   Number of children: Not on file   Years of education: Not on file   Highest education level: Not on file  Occupational History   Not on file  Tobacco Use   Smoking status: Every Day    Packs/day: 0.75    Years: 45.00    Pack years: 33.75    Types: Cigarettes   Smokeless tobacco: Never   Tobacco comments:    3/4 pack smoked daily [facility] 10/17/21  Vaping Use   Vaping Use: Never used  Substance and Sexual Activity   Alcohol use: Yes    Comment: Beer sometimes.   Drug use: No   Sexual activity: Not on file  Other Topics Concern   Not on file  Social History Narrative   Lives with wife Jarl Sellitto (married 76 years) and son. Has 4 dogs and fish. Not employed. Woodworking for fun. HS graduate.       Quit smoking in 2008. 26 pack years. No recreational drug use. Drinks 1 glass of wine per month. Does not exercise.    Social Determinants of Health   Financial Resource Strain: Not on file  Food Insecurity: Not on file  Transportation Needs: Not on file  Physical Activity: Not on file  Stress: Not on file  Social Connections: Not on file  Intimate Partner Violence: Not on file    Woodworking Has lived in [place], [place] Occupational asbestos exposure.  No TB exposure  Allergies  Allergen Reactions   Yellow Jacket Venom [Bee Venom] Anaphylaxis and Other (See Comments)    And the patient passes out   5-Alpha Reductase Inhibitors     Pt unsure why this is listed   Aspirin Rash     Outpatient Medications Prior to Visit  Medication Sig Dispense Refill   Accu-Chek FastClix Lancets MISC Check blood sugar 3 times a day. 102 each 11   alprazolam (XANAX) 2 MG tablet Take 2 mg by mouth at bedtime as needed for sleep or anxiety.       amLODipine (NORVASC) 10 MG tablet Take 1 tablet by mouth once daily 90 tablet 2   atorvastatin (LIPITOR) 40 MG tablet Take 1 tablet (40 mg total) by mouth daily at 6 PM. 90 tablet 3   blood glucose meter kit and supplies KIT Dispense based on patient and insurance preference. Use up to four times daily as directed. (FOR ICD-9 250.00, 250.01). 1 each 0   Blood Glucose Monitoring Suppl (ACCU-CHEK GUIDE) w/Device KIT 1 each by Does not apply route 3 (three) times daily. 1 kit 1   Capsaicin 0.025 % PADS Apply 1 application topically 3 (three) times daily as needed. 20 each 0   capsicum (ZOSTRIX) 0.075 % topical cream Apply 1 application topically 3 (three) times daily as needed. 114 g 0   diclofenac sodium (VOLTAREN) 1 % GEL Apply 2 g topically 4 (four) times daily. 350 g 6   diphenhydrAMINE (BENADRYL) 25 mg capsule Take 25 mg by mouth every 8 (eight) hours as needed for allergies.     EPINEPHrine 0.3 mg/0.3 mL IJ SOAJ injection Inject 0.3 mg into the muscle once as needed (for anaphylactic reaction to bee stings).     glucose blood (ACCU-CHEK GUIDE) test strip Check blood sugar 3 times a day. 100 each 12   Insulin Syringe-Needle U-100 (BD VEO INSULIN SYRINGE U/F) 31G X 15/64" 0.3 ML MISC USE TWICE DAILY TO INJECT INSULIN 100 each 0   lamoTRIgine (LAMICTAL) 150 MG tablet Take 150 mg by mouth 2 (two) times daily.     lithium carbonate (ESKALITH) 450 MG CR tablet Take 450-675 mg by mouth See admin instructions. 675 mg by mouth in the morning and 450 mg at bedtime     losartan (COZAAR) 100 MG tablet Take 1 tablet (100 mg total) by mouth daily. 30 tablet 11   metFORMIN (GLUCOPHAGE) 1000 MG tablet TAKE 1 TABLET BY MOUTH TWICE DAILY WITH A MEAL 180 tablet 2   methocarbamol (ROBAXIN) 500 MG tablet Take by mouth.     multivitamin (ONE-A-DAY MEN'S) TABS tablet Take 1 tablet by mouth daily.     NOVOLOG 100 UNIT/ML injection INJECT 10 UNITS SUBCUTANEOUSLY THREE TIMES DAILY WITH MEALS 10 mL 2   omeprazole  (PRILOSEC) 40 MG capsule Take 1 capsule (40 mg total) by mouth daily. 90 capsule 0   PFIZER COVID-19 VAC BIVALENT injection      pregabalin (LYRICA) 150 MG capsule Take 1 capsule by mouth twice daily 60 capsule 0   QUEtiapine (SEROQUEL) 50 MG tablet Take 225 mg by mouth at bedtime.     traMADol (ULTRAM) 50 MG tablet Take 1 tablet by mouth twice daily as needed 60  tablet 0   TRELEGY ELLIPTA 100-62.5-25 MCG/ACT AEPB INHALE 1 PUFF ONCE DAILY 60 each 0   VENTOLIN HFA 108 (90 Base) MCG/ACT inhaler INHALE 2 PUFFS BY MOUTH EVERY 6 HOURS AS NEEDED FOR WHEEZING OR SHORTNESS OF BREATH 18 g 3   ipratropium-albuterol (DUONEB) 0.5-2.5 (3) MG/3ML SOLN Take 3 mLs by nebulization every 4 (four) hours as needed. (Patient not taking: Reported on 10/17/2021) 360 mL 1   meloxicam (MOBIC) 15 MG tablet Take by mouth. (Patient not taking: Reported on 10/17/2021)     No facility-administered medications prior to visit.        Objective:   Physical Exam Vitals:   10/17/21 0934  BP: 134/76  Pulse: 97  Temp: 97.8 F (36.6 C)  TempSrc: Oral  SpO2: 97%  Weight: 264 lb 9.6 oz (120 kg)  Height: _0  (1.854 m)    Gen: [place], obese man, in no distress,  normal affect  ENT: No lesions,  mouth clear,  oropharynx clear, no postnasal drip  Neck: No JVD, no stridor  Lungs: No use of accessory muscles, no crackles or wheezing on normal respiration, soft B wheeze on forced expiration  Cardiovascular: RRR, heart sounds normal, no murmur or gallops, no peripheral edema  Musculoskeletal: No deformities, no cyanosis or clubbing  Neuro: alert, awake, non focal  Skin: Warm, no lesions or rash      Assessment & Plan:    COPD (chronic obstructive pulmonary disease) (HCC) We will perform pulmonary function testing in next office visit. We will perform lab work today (alpha-1 antitrypsin level) Please continue Trelegy 1 inhalation once daily.  Rinse and gargle after using. Keep albuterol available to use 2  puffs up to every 4 hours if needed for shortness of breath, chest tightness, wheezing.  Continue to work on walking, exercise, conditioning.  This will help her functional capacity. Follow with Dr. Lamonte Sakai next available with full pulmonary function testing on the same day.   Tobacco use You need to cut down your cigarettes.  Ultimate goal will be to stop altogether.  Try to get down to 10 cigarettes daily by our next visit. Get your lung cancer screening CT scan this month as planned   Baltazar Apo, MD, PhD 10/17/2021, 10:01 AM [facility] [phone] or if no answer before 7:00PM call [phone] For any issues after 7:00PM please call eLink [phone]

## 2021-10-17 NOTE — Patient Instructions (Signed)
We will perform pulmonary function testing in next office visit. ?We will perform lab work today (alpha-1 antitrypsin level) ?Please continue Trelegy 1 inhalation once daily.  Rinse and gargle after using. ?Keep albuterol available to use 2 puffs up to every 4 hours if needed for shortness of breath, chest tightness, wheezing.  ?Continue to work on walking, exercise, conditioning.  This will help her functional capacity. ?You need to cut down your cigarettes.  Ultimate goal will be to stop altogether.  Try to get down to 10 cigarettes daily by our next visit. ?Get your lung cancer screening CT scan this month as planned ?Follow with Dr. Lamonte Sakai next available with full pulmonary function testing on the same day.  ? ?

## 2021-10-17 NOTE — Addendum Note (Signed)
Addended by: Gavin Potters R on: 10/17/2021 10:38 AM ? ? Modules accepted: Orders ? ?

## 2021-10-19 ENCOUNTER — Other Ambulatory Visit: Payer: Self-pay | Admitting: Internal Medicine

## 2021-10-19 DIAGNOSIS — E119 Type 2 diabetes mellitus without complications: Secondary | ICD-10-CM

## 2021-10-21 ENCOUNTER — Ambulatory Visit: Payer: Medicare HMO | Admitting: Physical Therapy

## 2021-10-21 ENCOUNTER — Other Ambulatory Visit: Payer: Self-pay

## 2021-10-21 ENCOUNTER — Encounter: Payer: Self-pay | Admitting: Physical Therapy

## 2021-10-21 DIAGNOSIS — M542 Cervicalgia: Secondary | ICD-10-CM | POA: Diagnosis not present

## 2021-10-21 DIAGNOSIS — G8929 Other chronic pain: Secondary | ICD-10-CM | POA: Diagnosis not present

## 2021-10-21 DIAGNOSIS — M25512 Pain in left shoulder: Secondary | ICD-10-CM | POA: Diagnosis not present

## 2021-10-21 DIAGNOSIS — M5412 Radiculopathy, cervical region: Secondary | ICD-10-CM | POA: Diagnosis not present

## 2021-10-21 DIAGNOSIS — M6281 Muscle weakness (generalized): Secondary | ICD-10-CM | POA: Diagnosis not present

## 2021-10-21 DIAGNOSIS — M25511 Pain in right shoulder: Secondary | ICD-10-CM | POA: Diagnosis not present

## 2021-10-21 DIAGNOSIS — M47812 Spondylosis without myelopathy or radiculopathy, cervical region: Secondary | ICD-10-CM | POA: Diagnosis not present

## 2021-10-21 NOTE — Therapy (Signed)
?OUTPATIENT PHYSICAL THERAPY TREATMENT NOTE ? ? ?Patient Name: Jesus Martin ?MRN: [id number] ?DOB:12-30-1960, 61 y.o., male ?Today's Date: 10/21/2021 ? ?PCP: Christiana Fuchs, DO ?REFERRING PROVIDER: Christiana Fuchs, DO ? ? PT End of Session - 10/21/21 1025   ? ? Visit Number 3   ? Number of Visits 16   ? Date for PT Re-Evaluation 12/04/21   ? Authorization Type [employer]   ? PT Start Time 1020   ? PT Stop Time 1100   ? PT Time Calculation (min) 40 min   ? Activity Tolerance Patient tolerated treatment well   ? Behavior During Therapy [facility] for tasks assessed/performed   ? ?  ?  ? ?  ? ? ? ?Past Medical History:  ?Diagnosis Date  ? Beta-hemolytic group A streptococcal sepsis ([place])   ? Bipolar I disorder ([place]) 1991  ? Dr. Toy Care  ? Borderline hyperlipidemia   ? history of  ? Childhood asthma   ? COPD (chronic obstructive pulmonary disease) ([place])   ? Diabetes mellitus, new onset ([place]) 12/22/2017  ? Elevated glucose   ? Fibromyalgia   ? GERD (gastroesophageal reflux disease)   ? HTN (hypertension)   ? Low vitamin B12 level 06/07/2018  ? Lower back pain   ? Lumbar vertebral fracture ([place]) 01/29/2019  ? L1  ? Paranoia ([place])   ? Tobacco abuse   ? Tobacco abuse   ? Vitamin D deficiency   ? ?Past Surgical History:  ?Procedure Laterality Date  ? APPENDECTOMY    ? 1975  ? BACK SURGERY    ? 1998  ? NOSE SURGERY    ? ?Patient Active Problem List  ? Diagnosis Date Noted  ? Cervical spondylosis 02/27/2021  ? Rash 07/10/2020  ? Subclinical hyperthyroidism 04/25/2020  ? Tobacco use 04/16/2020  ? Multiple pulmonary nodules 04/16/2020  ? Fatigue 04/16/2020  ? Chest pain 12/12/2019  ? Compression fracture of spine ([place]) 01/30/2019  ? Hyperlipidemia 09/26/2018  ? Foot pain, bilateral 09/26/2018  ? Diabetic neuropathy ([place]) 12/22/2017  ? Chronic pain 11/24/2017  ? Hepatic steatosis 11/24/2017  ? COPD (chronic obstructive pulmonary disease) ([place]) 11/24/2017  ? Diabetes ([place])   ? Bipolar 1 disorder ([place]) 04/17/2011  ? Hypertension  04/17/2011  ? ?PCP: Christiana Fuchs, DO ?  ?REFERRING PROVIDER: Axel Filler,* ?  ?REFERRING DIAG: [phone] (ICD-10-CM) - Cervical spondylosis M54.12 (ICD-10-CM) - Cervical radiculopathy  ? ?THERAPY DIAG:  ?Cervicalgia ? ?Muscle weakness (generalized) ? ?Chronic left shoulder pain ? ?Chronic right shoulder pain ? ?PERTINENT HISTORY: cervical spondylosis, arthropathy of cervical facet joint, and axial and myofascial pain ?Lumbar fracture with kyphoplasty (about 3 yrs ago) ?Diabetes, HTN, COPD ?Bipolar disorder ? ?PRECAUTIONS: None ? ?SUBJECTIVE:  ?The exercises make my back hurt.  The neck feels like some pressure is off of it.  I was very sore the day after the needling and I might do it one more time.   ? ?  ? ?PAIN:  ?Are you having pain? Yes ?NPRS scale: 6/10,  ?Pain location: post neck and shoulder, L upper trap to central . 7/10 in low back  ?Pain orientation: Left, Bilateral, Posterior, and Upper  ?PAIN TYPE: aching and stabbing and pressure ?Pain description: constant and stabbing  ?Aggravating factors: turning his head  ?Relieving factors: limited relief with meds or any ?other measures  ?  ? ? ? ? ?OBJECTIVE:  ?  ?DIAGNOSTIC FINDINGS:  ?Cervical spondylosis as outlined.  ? ?Levels of mild spinal canal narrowing. Most notably at C4-C5,  a  ?small central disc protrusion contributes to mild spinal canal  ?narrowing, and may contact the ventral spinal cord.  ? ?Multilevel foraminal stenosis, as detailed and greatest on the right  ?at C2-C3 (moderate), on the right at C4-C5 (moderate/severe),  ?bilaterally at C5-C6 (severe right, moderate/severe left) and  ?bilaterally at C6-C7 (severe right, moderate left).  ? ?Disc degeneration is greatest at C5-C6 (moderate) and C6-C7  ?(moderate to moderately advanced). Associated mild degenerative  ?endplate edema at these levels.  ? ?Straightening of the expected cervical lordosis.   ?  ?PATIENT SURVEYS:  ?FOTO 51% ?  ?  ?COGNITION: ?         Overall cognitive  status: Within functional limits for tasks assessed              ?          ?SENSATION: ?         Light touch: Appears intact ?         Stereognosis: Appears intact ?         Hot/Cold: Appears intact ?         Proprioception: Appears intact ?  ?POSTURE:  ?Forward head, scapula abducted from midline and GH joint internal rotation ?  ?PALPATION: ?Pain along bilateral occipitals and extending laterally to upper traps ?  ?CERVICAL AROM/PROM ?  ?A/PROM A/PROM (deg) ?09/23/2021  ?Flexion 25  ?Extension 30  ?Right lateral flexion 20  ?Left lateral flexion 20  ?Right rotation 40  ?Left rotation 40  ? (Blank rows = not tested) ?  ?UE AROM/PROM: ?  ?A/PROM Right ?09/23/2021 Left ?09/23/2021  ?Shoulder flexion AROM 110 approx.  AROM 110 approx.   ?Shoulder extension      ?Shoulder abduction 100 approx.  100 approx   ?Shoulder adduction      ?Shoulder extension      ?Shoulder internal rotation pain Pain   ?Shoulder external rotation pain Pain   ? (Blank rows = not tested) ?  ?UE MMT: ?  ?MMT Right ?09/23/2021 Left ?09/23/2021  ?Shoulder flexion 5/5 5/5  ?Shoulder extension      ?Shoulder abduction 4+/5 4+/5  ?Shoulder adduction      ?Shoulder extension      ?Shoulder internal rotation      ?Shoulder external rotation      ?Middle trapezius      ?Lower trapezius      ?Elbow flexion 5/5 5/5  ?Elbow extension 4/5 4/5  ?Wrist flexion      ?Wrist extension      ?Wrist ulnar deviation      ?Wrist radial deviation      ?Wrist pronation      ?Wrist supination      ?Grip strength 46 lb due to arthritis  75 lbs  ? (Blank rows = not tested) ?  ?CERVICAL SPECIAL TESTS:  ?NT    ?  ?LUMBAR SPECIAL TESTS:  ?NT ?  ?FUNCTIONAL TESTS:  ?NT ?  ?GAIT: ?Distance walked: 150 ?Assistive device utilized: None ?Level of assistance: Modified independence ?Comments: not assessed ?  ? ? ?[facility] Adult PT Treatment:                                                DATE: 10/21/21 ?Therapeutic Exercise: ?NuStep L5 UE and LE for warm up, subjective  ?Standing scap  squeeze and row  gree band x 15 each , hold 5 sec  ?L stretch at counter top for pain relief of low back  ?Shoulder extension x 15  ?Standing Cane overhead lift for shoulder mobility and trunk ext. X 15 ?Supine lower trunk rotation with head turns x 10 ?Supine chin tuck into ball x 10  ?Horizontal pull green TB x 10 x 2  ?ER unattached green TB x 10 x 2  ?Diagonal pull green band x 10  ? ? ?[facility] Adult PT Treatment:                                                DATE: 10/16/21 ?Therapeutic Exercise: ?Review of HEP: scap squeeze x 10, supine chin tuck  x 10, pec stetch 15 sec x 3 in doorway, min cues  ?Upper trap stretch x 3 ?Levator stretch x3 ?Supine cane press up with pullover x 10 -cues to keep comfortable ?Manual Therapy: ?STW to bilat upper/ mid traps, passive cervical rotation and side bend, levator stretch ?Trigger Point Dry-Needling  ?Treatment instructions: Expect mild to moderate muscle soreness. S/S of pneumothorax if dry needled over a lung field, and to seek immediate medical attention should they occur. Patient verbalized understanding of these instructions and education. ? ?Patient Consent Given: Yes ?Education handout provided: Yes ?Muscles treated: bilat upper trap ?Electrical stimulation performed: No ?Parameters: N/A ?Treatment response/outcome: Twitch response elicited and Palpable decrease in muscle tension  ?  ?TODAY'S TREATMENT  ?PT eval, HEP and prognosis ?Brief manual therapy for reducing tension along posterior cervicals, light and gentle suboccipital release and PROM rotation, lateral sidebending  ?  ?  ?PATIENT EDUCATION:  ?Education details: PT/POC, HEP posture and Dry needling  ?Person educated: Patient and Spouse ?Education method: Explanation, Demonstration, and Handouts ?Education comprehension: verbalized understanding, verbal cues required, and needs further education ?  ?  ?HOME EXERCISE PROGRAM: ?Access Code: [email or web address] ?URL: [email or web address] ?Date:  10/16/2021 ?Prepared by: Hessie Diener ? ?Exercises ?Supine Cervical Retraction with Towel - 2-3 x daily - 7 x weekly - 2 sets - 10 reps - 5 hold ?Seated Scapular Retraction - 2-3 x daily - 7 x weekly - 2 sets - 10 reps - 5 hold ?C

## 2021-10-23 ENCOUNTER — Other Ambulatory Visit: Payer: Self-pay

## 2021-10-23 ENCOUNTER — Encounter: Payer: Self-pay | Admitting: Physical Therapy

## 2021-10-23 ENCOUNTER — Ambulatory Visit: Payer: Medicare HMO | Admitting: Physical Therapy

## 2021-10-23 DIAGNOSIS — M47812 Spondylosis without myelopathy or radiculopathy, cervical region: Secondary | ICD-10-CM | POA: Diagnosis not present

## 2021-10-23 DIAGNOSIS — M542 Cervicalgia: Secondary | ICD-10-CM | POA: Diagnosis not present

## 2021-10-23 DIAGNOSIS — M25511 Pain in right shoulder: Secondary | ICD-10-CM | POA: Diagnosis not present

## 2021-10-23 DIAGNOSIS — M25512 Pain in left shoulder: Secondary | ICD-10-CM | POA: Diagnosis not present

## 2021-10-23 DIAGNOSIS — M6281 Muscle weakness (generalized): Secondary | ICD-10-CM | POA: Diagnosis not present

## 2021-10-23 DIAGNOSIS — G8929 Other chronic pain: Secondary | ICD-10-CM | POA: Diagnosis not present

## 2021-10-23 DIAGNOSIS — M5412 Radiculopathy, cervical region: Secondary | ICD-10-CM | POA: Diagnosis not present

## 2021-10-23 NOTE — Therapy (Signed)
?OUTPATIENT PHYSICAL THERAPY TREATMENT NOTE ? ? ?Patient Name: Jesus Martin ?MRN: [id number] ?DOB:08-Jul-1961, 61 y.o., male ?Today's Date: 10/23/2021 ? ?PCP: Christiana Fuchs, DO ?REFERRING PROVIDER: Axel Filler,* ? ? PT End of Session - 10/23/21 0930   ? ? Visit Number 4   ? Number of Visits 16   ? Date for PT Re-Evaluation 12/04/21   ? Authorization Type [employer]   ? PT Start Time 0930   ? PT Stop Time 1014   ? PT Time Calculation (min) 44 min   ? ?  ?  ? ?  ? ? ? ?Past Medical History:  ?Diagnosis Date  ? Beta-hemolytic group A streptococcal sepsis ([place])   ? Bipolar I disorder ([place]) 1991  ? Dr. Toy Care  ? Borderline hyperlipidemia   ? history of  ? Childhood asthma   ? COPD (chronic obstructive pulmonary disease) ([place])   ? Diabetes mellitus, new onset ([place]) 12/22/2017  ? Elevated glucose   ? Fibromyalgia   ? GERD (gastroesophageal reflux disease)   ? HTN (hypertension)   ? Low vitamin B12 level 06/07/2018  ? Lower back pain   ? Lumbar vertebral fracture ([place]) 01/29/2019  ? L1  ? Paranoia ([place])   ? Tobacco abuse   ? Tobacco abuse   ? Vitamin D deficiency   ? ?Past Surgical History:  ?Procedure Laterality Date  ? APPENDECTOMY    ? 1975  ? BACK SURGERY    ? 1998  ? NOSE SURGERY    ? ?Patient Active Problem List  ? Diagnosis Date Noted  ? Cervical spondylosis 02/27/2021  ? Rash 07/10/2020  ? Subclinical hyperthyroidism 04/25/2020  ? Tobacco use 04/16/2020  ? Multiple pulmonary nodules 04/16/2020  ? Fatigue 04/16/2020  ? Chest pain 12/12/2019  ? Compression fracture of spine ([place]) 01/30/2019  ? Hyperlipidemia 09/26/2018  ? Foot pain, bilateral 09/26/2018  ? Diabetic neuropathy ([place]) 12/22/2017  ? Chronic pain 11/24/2017  ? Hepatic steatosis 11/24/2017  ? COPD (chronic obstructive pulmonary disease) ([place]) 11/24/2017  ? Diabetes ([place])   ? Bipolar 1 disorder ([place]) 04/17/2011  ? Hypertension 04/17/2011  ? ?PCP: Christiana Fuchs, DO ?  ?REFERRING PROVIDER: Axel Filler,* ?  ?REFERRING DIAG: [phone]  (ICD-10-CM) - Cervical spondylosis M54.12 (ICD-10-CM) - Cervical radiculopathy  ? ?THERAPY DIAG:  ?Cervicalgia ? ?Muscle weakness (generalized) ? ?Chronic left shoulder pain ? ?Chronic right shoulder pain ? ?PERTINENT HISTORY: cervical spondylosis, arthropathy of cervical facet joint, and axial and myofascial pain ?Lumbar fracture with kyphoplasty (about 3 yrs ago) ?Diabetes, HTN, COPD ?Bipolar disorder ? ?PRECAUTIONS: None ? ?SUBJECTIVE:  ?The upper traps are looser now. I still have pain in my mid back and shoulder blades.  ? ?  ? ?PAIN:  ?Are you having pain? Yes ?NPRS scale: 5/10,  ?Pain location: mid back to periscap  ?Pain orientation: Left, Bilateral, Posterior, and Upper  ?PAIN TYPE: aching and stabbing and pressure ?Pain description: constant and stabbing  ?Aggravating factors: turning his head  ?Relieving factors: limited relief with meds or any ?other measures  ?  ? ? ? ? ?OBJECTIVE:  ?  ?DIAGNOSTIC FINDINGS:  ?Cervical spondylosis as outlined.  ? ?Levels of mild spinal canal narrowing. Most notably at C4-C5, a  ?small central disc protrusion contributes to mild spinal canal  ?narrowing, and may contact the ventral spinal cord.  ? ?Multilevel foraminal stenosis, as detailed and greatest on the right  ?at C2-C3 (moderate), on the right at C4-C5 (moderate/severe),  ?bilaterally at C5-C6 (severe right,  moderate/severe left) and  ?bilaterally at C6-C7 (severe right, moderate left).  ? ?Disc degeneration is greatest at C5-C6 (moderate) and C6-C7  ?(moderate to moderately advanced). Associated mild degenerative  ?endplate edema at these levels.  ? ?Straightening of the expected cervical lordosis.   ?  ?PATIENT SURVEYS:  ?FOTO 51% ?  ?  ?COGNITION: ?         Overall cognitive status: Within functional limits for tasks assessed              ?          ?SENSATION: ?         Light touch: Appears intact ?         Stereognosis: Appears intact ?         Hot/Cold: Appears intact ?         Proprioception: Appears  intact ?  ?POSTURE:  ?Forward head, scapula abducted from midline and GH joint internal rotation ?  ?PALPATION: ?Pain along bilateral occipitals and extending laterally to upper traps ?  ?CERVICAL AROM/PROM ?  ?A/PROM A/PROM (deg) ?09/23/2021 AROM ?10/23/21  ?Flexion 25   ?Extension 30   ?Right lateral flexion 20 32  ?Left lateral flexion 20 32  ?Right rotation 40   ?Left rotation 40   ? (Blank rows = not tested) ?  ?UE AROM/PROM: ?  ?A/PROM Right ?09/23/2021 Left ?09/23/2021  ?Shoulder flexion AROM 110 approx.  AROM 110 approx.   ?Shoulder extension      ?Shoulder abduction 100 approx.  100 approx   ?Shoulder adduction      ?Shoulder extension      ?Shoulder internal rotation pain Pain   ?Shoulder external rotation pain Pain   ? (Blank rows = not tested) ?  ?UE MMT: ?  ?MMT Right ?09/23/2021 Left ?09/23/2021  ?Shoulder flexion 5/5 5/5  ?Shoulder extension      ?Shoulder abduction 4+/5 4+/5  ?Shoulder adduction      ?Shoulder extension      ?Shoulder internal rotation      ?Shoulder external rotation      ?Middle trapezius      ?Lower trapezius      ?Elbow flexion 5/5 5/5  ?Elbow extension 4/5 4/5  ?Wrist flexion      ?Wrist extension      ?Wrist ulnar deviation      ?Wrist radial deviation      ?Wrist pronation      ?Wrist supination      ?Grip strength 46 lb due to arthritis  75 lbs  ? (Blank rows = not tested) ?  ?CERVICAL SPECIAL TESTS:  ?NT    ?  ?LUMBAR SPECIAL TESTS:  ?NT ?  ?FUNCTIONAL TESTS:  ?NT ?  ?GAIT: ?Distance walked: 150 ?Assistive device utilized: None ?Level of assistance: Modified independence ?Comments: not assessed ?  ? ? ?[facility] Adult PT Treatment:                                                DATE: 10/23/21 ?Therapeutic Exercise: ?Thoracic extension in chair with pec stretch 5 sec x 10 ?Open books modified to hand on chest x 5 each  ?Supine Protraction AROM bilateral x 15 ?Supine Horizontal pull green TB x 15 ?Supine ER unattached green TB x 10 x 2  ?Supine Diagonal pull green band alternating  x 10   ?  Seated scap squeezes x 15 ?Shoulder rolls x 10 ?Standing green band row and extensions x 20 ?Standing shoulder flexion x 10 bilat AROM ?Standing shoulder abduction x 10 AROM bilat ?Upper trap stretches ?Levator stretches ?SELF CARE: instructed in use of theracane vs tennis ball for self Trigger point release. Pt preferred tennis ball ? ?[facility] Adult PT Treatment:                                                DATE: 10/21/21 ?Therapeutic Exercise: ?NuStep L5 UE and LE for warm up, subjective  ?Standing scap squeeze and row gree band x 15 each , hold 5 sec  ?L stretch at counter top for pain relief of low back  ?Shoulder extension x 15  ?Standing Cane overhead lift for shoulder mobility and trunk ext. X 15 ?Supine lower trunk rotation with head turns x 10 ?Supine chin tuck into ball x 10  ?Horizontal pull green TB x 10 x 2  ?ER unattached green TB x 10 x 2  ?Diagonal pull green band x 10  ? ? ?[facility] Adult PT Treatment:                                                DATE: 10/16/21 ?Therapeutic Exercise: ?Review of HEP: scap squeeze x 10, supine chin tuck  x 10, pec stetch 15 sec x 3 in doorway, min cues  ?Upper trap stretch x 3 ?Levator stretch x3 ?Supine cane press up with pullover x 10 -cues to keep comfortable ?Manual Therapy: ?STW to bilat upper/ mid traps, passive cervical rotation and side bend, levator stretch ?Trigger Point Dry-Needling  ?Treatment instructions: Expect mild to moderate muscle soreness. S/S of pneumothorax if dry needled over a lung field, and to seek immediate medical attention should they occur. Patient verbalized understanding of these instructions and education. ? ?Patient Consent Given: Yes ?Education handout provided: Yes ?Muscles treated: bilat upper trap ?Electrical stimulation performed: No ?Parameters: N/A ?Treatment response/outcome: Twitch response elicited and Palpable decrease in muscle tension  ?  ?TODAY'S TREATMENT  ?PT eval, HEP and prognosis ?Brief manual therapy for reducing  tension along posterior cervicals, light and gentle suboccipital release and PROM rotation, lateral sidebending  ?  ?  ?PATIENT EDUCATION:  ?Education details: PT/POC, HEP posture and Dry needling  ?Person educate

## 2021-10-24 ENCOUNTER — Ambulatory Visit
Admission: RE | Admit: 2021-10-24 | Discharge: 2021-10-24 | Disposition: A | Discharge status: Home / Self Care | Payer: Medicare HMO | Source: Ambulatory Visit | Attending: Student in an Organized Health Care Education/Training Program | Admitting: Student in an Organized Health Care Education/Training Program

## 2021-10-24 DIAGNOSIS — Z720 Tobacco use: Secondary | ICD-10-CM

## 2021-10-24 DIAGNOSIS — F17210 Nicotine dependence, cigarettes, uncomplicated: Secondary | ICD-10-CM | POA: Diagnosis not present

## 2021-10-24 DIAGNOSIS — J449 Chronic obstructive pulmonary disease, unspecified: Secondary | ICD-10-CM

## 2021-10-27 NOTE — Therapy (Signed)
?OUTPATIENT PHYSICAL THERAPY TREATMENT NOTE ? ? ?Patient Name: MARKEVIUS TROMBETTA ?MRN: [id number] ?DOB:[birth year], 61 y.o., male ?Today's Date: 10/28/2021 ? ?PCP: Christiana Fuchs, DO ?REFERRING PROVIDER: Christiana Fuchs, DO ? ? PT End of Session - 10/28/21 [phone]   ? ? Visit Number 5   ? Number of Visits 16   ? Date for PT Re-Evaluation 12/04/21   ? Authorization Type [employer]   ? PT Start Time [phone]   ? PT Stop Time 1017   ? PT Time Calculation (min) 43 min   ? Activity Tolerance Patient tolerated treatment well;Patient limited by pain   ? Behavior During Therapy [facility] for tasks assessed/performed   ? ?  ?  ? ?  ? ? ? ? ?Past Medical History:  ?Diagnosis Date  ? Beta-hemolytic group A streptococcal sepsis ([place])   ? Bipolar I disorder ([place]) 1991  ? Dr. Toy Care  ? Borderline hyperlipidemia   ? history of  ? Childhood asthma   ? COPD (chronic obstructive pulmonary disease) ([place])   ? Diabetes mellitus, new onset ([place]) 12/22/2017  ? Elevated glucose   ? Fibromyalgia   ? GERD (gastroesophageal reflux disease)   ? HTN (hypertension)   ? Low vitamin B12 level 06/07/2018  ? Lower back pain   ? Lumbar vertebral fracture ([place]) 01/29/2019  ? L1  ? Paranoia ([place])   ? Tobacco abuse   ? Tobacco abuse   ? Vitamin D deficiency   ? ?Past Surgical History:  ?Procedure Laterality Date  ? APPENDECTOMY    ? 1975  ? BACK SURGERY    ? 1998  ? NOSE SURGERY    ? ?Patient Active Problem List  ? Diagnosis Date Noted  ? Cervical spondylosis 02/27/2021  ? Rash 07/10/2020  ? Subclinical hyperthyroidism 04/25/2020  ? Tobacco use 04/16/2020  ? Multiple pulmonary nodules 04/16/2020  ? Fatigue 04/16/2020  ? Chest pain 12/12/2019  ? Compression fracture of spine ([place]) 01/30/2019  ? Hyperlipidemia 09/26/2018  ? Foot pain, bilateral 09/26/2018  ? Diabetic neuropathy ([place]) 12/22/2017  ? Chronic pain 11/24/2017  ? Hepatic steatosis 11/24/2017  ? COPD (chronic obstructive pulmonary disease) ([place]) 11/24/2017  ? Diabetes ([place])   ? Bipolar 1 disorder ([place])  04/17/2011  ? Hypertension 04/17/2011  ? ?PCP: Christiana Fuchs, DO ?  ?REFERRING PROVIDER: Axel Filler,* ?  ?REFERRING DIAG: [phone] (ICD-10-CM) - Cervical spondylosis M54.12 (ICD-10-CM) - Cervical radiculopathy  ? ?THERAPY DIAG:  ?Cervicalgia ? ?Muscle weakness (generalized) ? ?Chronic left shoulder pain ? ?Chronic right shoulder pain ? ?PERTINENT HISTORY: cervical spondylosis, arthropathy of cervical facet joint, and axial and myofascial pain ?Lumbar fracture with kyphoplasty (about 3 yrs ago) ?Diabetes, HTN, COPD ?Bipolar disorder ? ?PRECAUTIONS: None ? ?SUBJECTIVE:  ?I am not that much better.  He has been doing the exercises, they don't "do me any harm" they just don't make me worse. Pt with low back pain today as well. The tennis ball really helped.  ? ?PAIN:  ?Are you having pain? Yes ?NPRS scale: 5/10,  ?Pain location: mid back to periscap  ?Pain orientation: Left, Bilateral, Posterior, and Upper  ?PAIN TYPE: aching and stabbing and pressure ?Pain description: constant and stabbing  ?Aggravating factors: turning his head  ?Relieving factors: limited relief with meds or any ?other measures  ?  ? ? ? ? ?OBJECTIVE:  ?  ?DIAGNOSTIC FINDINGS:  ?Cervical spondylosis as outlined.  ? ?Levels of mild spinal canal narrowing. Most notably at C4-C5, a  ?small central disc protrusion contributes  to mild spinal canal  ?narrowing, and may contact the ventral spinal cord.  ? ?Multilevel foraminal stenosis, as detailed and greatest on the right  ?at C2-C3 (moderate), on the right at C4-C5 (moderate/severe),  ?bilaterally at C5-C6 (severe right, moderate/severe left) and  ?bilaterally at C6-C7 (severe right, moderate left).  ? ?Disc degeneration is greatest at C5-C6 (moderate) and C6-C7  ?(moderate to moderately advanced). Associated mild degenerative  ?endplate edema at these levels.  ? ?Straightening of the expected cervical lordosis.   ?  ?PATIENT SURVEYS:  ?FOTO 51% ?  ?  ?COGNITION: ?         Overall cognitive  status: Within functional limits for tasks assessed              ?          ?SENSATION: ?         Light touch: Appears intact ?         Stereognosis: Appears intact ?         Hot/Cold: Appears intact ?         Proprioception: Appears intact ?  ?POSTURE:  ?Forward head, scapula abducted from midline and GH joint internal rotation ?  ?PALPATION: ?Pain along bilateral occipitals and extending laterally to upper traps ?  ?CERVICAL AROM/PROM ?  ?A/PROM A/PROM (deg) ?09/23/2021 AROM ?10/28/21  ?Flexion 25 30  ?Extension 30 10  ?Right lateral flexion 20 32  ?Left lateral flexion 20 32  ?Right rotation 40   ?Left rotation 40   ? (Blank rows = not tested) ?  ?UE AROM/PROM: ?  ?A/PROM Right ?09/23/2021 Left ?09/23/2021 10/28/21 10/28/21  ?Shoulder flexion AROM 110 approx.  AROM 110 approx.  145 145  ?Shoulder extension        ?Shoulder abduction 100 approx.  100 approx     ?Shoulder adduction        ?Shoulder extension        ?Shoulder internal rotation pain Pain     ?Shoulder external rotation pain Pain     ? (Blank rows = not tested) ?  ?UE MMT: ?  ?MMT Right ?09/23/2021 Left ?09/23/2021  ?Shoulder flexion 5/5 5/5  ?Shoulder extension      ?Shoulder abduction 4+/5 4+/5  ?Shoulder adduction      ?Shoulder extension      ?Shoulder internal rotation      ?Shoulder external rotation      ?Middle trapezius      ?Lower trapezius      ?Elbow flexion 5/5 5/5  ?Elbow extension 4/5 4/5  ?Wrist flexion      ?Wrist extension      ?Wrist ulnar deviation      ?Wrist radial deviation      ?Wrist pronation      ?Wrist supination      ?Grip strength 46 lb due to arthritis  75 lbs  ? (Blank rows = not tested) ?  ?CERVICAL SPECIAL TESTS:  ?NT    ?  ?LUMBAR SPECIAL TESTS:  ?NT ?  ?FUNCTIONAL TESTS:  ?NT ?  ?GAIT: ?Distance walked: 150 ?Assistive device utilized: None ?Level of assistance: Modified independence ?Comments: not assessed ?  ?[facility] Adult PT Treatment:                                                DATE: 10/28/21 ?Therapeutic  Exercise: ?  NuStep L6 UE and LE for 5 min  ?Scapular ROM (rolls)  ?Standing cane flexion, extension , IR AAROM ?AROM measured shoulder 140 deg pain in upper traps ?AROM cervical flex/ext ?Seated upper trap stretch and extension used towel to anchor shoulder and support neck  ?SNAG rotation to Rt neck towel x 3 each side ?Double arm ball press x 10  ?Ball bridges x 10  ?Ball trunk rotation x 10  ?Overhead lift red looped band x  10  ?Knee to chest x 1 min each LE  ?Figure 4 knee press and pull x 5 each  ? ? ?[facility] Adult PT Treatment:                                                DATE: 10/23/21 ?Therapeutic Exercise: ?Thoracic extension in chair with pec stretch 5 sec x 10 ?Open books modified to hand on chest x 5 each  ?Supine Protraction AROM bilateral x 15 ?Supine Horizontal pull green TB x 15 ?Supine ER unattached green TB x 10 x 2  ?Supine Diagonal pull green band alternating  x 10  ?Seated scap squeezes x 15 ?Shoulder rolls x 10 ?Standing green band row and extensions x 20 ?Standing shoulder flexion x 10 bilat AROM ?Standing shoulder abduction x 10 AROM bilat ?Upper trap stretches ?Levator stretches ?SELF CARE: instructed in use of theracane vs tennis ball for self Trigger point release. Pt preferred tennis ball ? ?[facility] Adult PT Treatment:                                                DATE: 10/21/21 ?Therapeutic Exercise: ?NuStep L5 UE and LE for warm up, subjective  ?Standing scap squeeze and row gree band x 15 each , hold 5 sec  ?L stretch at counter top for pain relief of low back  ?Shoulder extension x 15  ?Standing Cane overhead lift for shoulder mobility and trunk ext. X 15 ?Supine lower trunk rotation with head turns x 10 ?Supine chin tuck into ball x 10  ?Horizontal pull green TB x 10 x 2  ?ER unattached green TB x 10 x 2  ?Diagonal pull green band x 10  ? ? ?[facility] Adult PT Treatment:                                                DATE: 10/16/21 ?Therapeutic Exercise: ?Review of HEP: scap squeeze x 10, supine  chin tuck  x 10, pec stetch 15 sec x 3 in doorway, min cues  ?Upper trap stretch x 3 ?Levator stretch x3 ?Supine cane press up with pullover x 10 -cues to keep comfortable ?Manual Therapy: ?STW to bilat upper/ mid traps, p

## 2021-10-28 ENCOUNTER — Ambulatory Visit: Payer: Medicare HMO | Admitting: Physical Therapy

## 2021-10-28 ENCOUNTER — Encounter: Payer: Self-pay | Admitting: Physical Therapy

## 2021-10-28 ENCOUNTER — Other Ambulatory Visit: Payer: Self-pay

## 2021-10-28 DIAGNOSIS — M47812 Spondylosis without myelopathy or radiculopathy, cervical region: Secondary | ICD-10-CM | POA: Diagnosis not present

## 2021-10-28 DIAGNOSIS — M542 Cervicalgia: Secondary | ICD-10-CM

## 2021-10-28 DIAGNOSIS — M25512 Pain in left shoulder: Secondary | ICD-10-CM | POA: Diagnosis not present

## 2021-10-28 DIAGNOSIS — G8929 Other chronic pain: Secondary | ICD-10-CM | POA: Diagnosis not present

## 2021-10-28 DIAGNOSIS — M5412 Radiculopathy, cervical region: Secondary | ICD-10-CM | POA: Diagnosis not present

## 2021-10-28 DIAGNOSIS — M6281 Muscle weakness (generalized): Secondary | ICD-10-CM

## 2021-10-28 DIAGNOSIS — M25511 Pain in right shoulder: Secondary | ICD-10-CM | POA: Diagnosis not present

## 2021-10-28 LAB — ALPHA-1 ANTITRYPSIN PHENOTYPE: A-1 Antitrypsin, Ser: 146 mg/dL (ref 83–199)

## 2021-10-30 ENCOUNTER — Other Ambulatory Visit: Payer: Self-pay | Admitting: Internal Medicine

## 2021-10-30 NOTE — Therapy (Signed)
?OUTPATIENT PHYSICAL THERAPY TREATMENT NOTE ? ? ?Patient Name: Jesus Martin ?MRN: [id number] ?DOB:06-20-1961, 61 y.o., male ?Today's Date: 10/31/2021 ? ?PCP: Christiana Fuchs, DO ?REFERRING PROVIDER: Axel Filler,* ? ? PT End of Session - 10/31/21 1018   ? ? Visit Number 6   ? Number of Visits 16   ? Date for PT Re-Evaluation 12/04/21   ? Authorization Type [employer]   ? PT Start Time 1016   ? PT Stop Time 1100   ? PT Time Calculation (min) 44 min   ? Activity Tolerance Patient tolerated treatment well;Patient limited by pain   ? Behavior During Therapy [facility] for tasks assessed/performed   ? ?  ?  ? ?  ? ? ? ? ? ?Past Medical History:  ?Diagnosis Date  ? Beta-hemolytic group A streptococcal sepsis ([place])   ? Bipolar I disorder ([place]) 1991  ? Dr. Toy Care  ? Borderline hyperlipidemia   ? history of  ? Childhood asthma   ? COPD (chronic obstructive pulmonary disease) ([place])   ? Diabetes mellitus, new onset ([place]) 12/22/2017  ? Elevated glucose   ? Fibromyalgia   ? GERD (gastroesophageal reflux disease)   ? HTN (hypertension)   ? Low vitamin B12 level 06/07/2018  ? Lower back pain   ? Lumbar vertebral fracture ([place]) 01/29/2019  ? L1  ? Paranoia ([place])   ? Tobacco abuse   ? Tobacco abuse   ? Vitamin D deficiency   ? ?Past Surgical History:  ?Procedure Laterality Date  ? APPENDECTOMY    ? 1975  ? BACK SURGERY    ? 1998  ? NOSE SURGERY    ? ?Patient Active Problem List  ? Diagnosis Date Noted  ? Cervical spondylosis 02/27/2021  ? Rash 07/10/2020  ? Subclinical hyperthyroidism 04/25/2020  ? Tobacco use 04/16/2020  ? Multiple pulmonary nodules 04/16/2020  ? Fatigue 04/16/2020  ? Chest pain 12/12/2019  ? Compression fracture of spine ([place]) 01/30/2019  ? Hyperlipidemia 09/26/2018  ? Foot pain, bilateral 09/26/2018  ? Diabetic neuropathy ([place]) 12/22/2017  ? Chronic pain 11/24/2017  ? Hepatic steatosis 11/24/2017  ? COPD (chronic obstructive pulmonary disease) ([place]) 11/24/2017  ? Diabetes ([place])   ? Bipolar 1 disorder ([place])  04/17/2011  ? Hypertension 04/17/2011  ? ?PCP: Christiana Fuchs, DO ?  ?REFERRING PROVIDER: Axel Filler,* ?  ?REFERRING DIAG: [phone] (ICD-10-CM) - Cervical spondylosis M54.12 (ICD-10-CM) - Cervical radiculopathy  ? ?THERAPY DIAG:  ?Cervicalgia ? ?Muscle weakness (generalized) ? ?Chronic left shoulder pain ? ?Chronic right shoulder pain ? ?PERTINENT HISTORY: cervical spondylosis, arthropathy of cervical facet joint, and axial and myofascial pain ?Lumbar fracture with kyphoplasty (about 3 yrs ago) ?Diabetes, HTN, COPD ?Bipolar disorder ? ?PRECAUTIONS: None ? ?SUBJECTIVE:  ?Pt reports he feels better the day after PT, but the relief does not last. ? ?PAIN:  ?Are you having pain? Yes ?NPRS scale: 5/10,  ?Pain location: mid back to periscap  ?Pain orientation: Left, Bilateral, Posterior, and Upper  ?PAIN TYPE: aching and stabbing and pressure ?Pain description: constant and stabbing  ?Aggravating factors: turning his head  ?Relieving factors: limited relief with meds or any ?other measures  ? ? ?OBJECTIVE:  ?  ?DIAGNOSTIC FINDINGS:  ?Cervical spondylosis as outlined.  ? ?Levels of mild spinal canal narrowing. Most notably at C4-C5, a  ?small central disc protrusion contributes to mild spinal canal  ?narrowing, and may contact the ventral spinal cord.  ? ?Multilevel foraminal stenosis, as detailed and greatest on the right  ?at  C2-C3 (moderate), on the right at C4-C5 (moderate/severe),  ?bilaterally at C5-C6 (severe right, moderate/severe left) and  ?bilaterally at C6-C7 (severe right, moderate left).  ? ?Disc degeneration is greatest at C5-C6 (moderate) and C6-C7  ?(moderate to moderately advanced). Associated mild degenerative  ?endplate edema at these levels.  ? ?Straightening of the expected cervical lordosis.   ?  ?PATIENT SURVEYS:  ?FOTO 51% ?  ?  ?COGNITION: ?         Overall cognitive status: Within functional limits for tasks assessed              ?          ?SENSATION: ?         Light touch: Appears  intact ?         Stereognosis: Appears intact ?         Hot/Cold: Appears intact ?         Proprioception: Appears intact ?  ?POSTURE:  ?Forward head, scapula abducted from midline and GH joint internal rotation ?  ?PALPATION: ?Pain along bilateral occipitals and extending laterally to upper traps ?  ?CERVICAL AROM/PROM ?  ?A/PROM A/PROM (deg) ?09/23/2021 AROM ?10/28/21  ?Flexion 25 30  ?Extension 30 10  ?Right lateral flexion 20 32  ?Left lateral flexion 20 32  ?Right rotation 40   ?Left rotation 40   ? (Blank rows = not tested) ?  ?UE AROM/PROM: ?  ?A/PROM Right ?09/23/2021 Left ?09/23/2021 10/28/21 10/28/21  ?Shoulder flexion AROM 110 approx.  AROM 110 approx.  145 145  ?Shoulder extension        ?Shoulder abduction 100 approx.  100 approx     ?Shoulder adduction        ?Shoulder extension        ?Shoulder internal rotation pain Pain     ?Shoulder external rotation pain Pain     ? (Blank rows = not tested) ?  ?UE MMT: ?  ?MMT Right ?09/23/2021 Left ?09/23/2021  ?Shoulder flexion 5/5 5/5  ?Shoulder extension      ?Shoulder abduction 4+/5 4+/5  ?Shoulder adduction      ?Shoulder extension      ?Shoulder internal rotation      ?Shoulder external rotation      ?Middle trapezius      ?Lower trapezius      ?Elbow flexion 5/5 5/5  ?Elbow extension 4/5 4/5  ?Wrist flexion      ?Wrist extension      ?Wrist ulnar deviation      ?Wrist radial deviation      ?Wrist pronation      ?Wrist supination      ?Grip strength 46 lb due to arthritis  75 lbs  ? (Blank rows = not tested) ?  ?CERVICAL SPECIAL TESTS:  ?NT    ?  ?LUMBAR SPECIAL TESTS:  ?NT ?  ?FUNCTIONAL TESTS:  ?NT ?  ?GAIT: ?Distance walked: 150 ?Assistive device utilized: None ?Level of assistance: Modified independence ?Comments: not assessed ? ?[facility] Adult PT Treatment:                                                DATE: 10/31/21 ?Therapeutic Exercise: ?Supine cervical retraction x10 5" ?Sitting cervical retractions x10 5" ?Sitting rotation 10x 5" c over pressure L and R   ?Shoulder hor. Abd 2x10 GTB ?Shoulder ER  2x10 L and R GTB ? ?Manual Therapy: ?STM and DTM to the cervical paraspinals, upper traps and levator ?Manual cervical traction 15" on, 10" off ? ?Trigger Point Dry Needling Treatment: ?Pre-treatment instruction: Patient instructed on dry needling rationale, procedures, and possible side effects including pain during treatment (achy,cramping feeling), bruising, drop of blood, lightheadedness, nausea, sweating. ?Patient Consent Given: Yes ?Education handout provided: Previously provided ?Muscles treated: Bilat upper trap, C4-C6 multifidi and spinalis cervicis  ?Needle size and number: .30x66m x 2  ?Electrical stimulation performed: No ?Parameters: N/A ?Treatment response/outcome: Twitch response elicited and Palpable decrease in muscle tension ?Post-treatment instructions: Patient instructed to expect possible mild to moderate muscle soreness later today and/or tomorrow. Patient instructed in methods to reduce muscle soreness and to continue prescribed HEP. If patient was dry needled over the lung field, patient was instructed on signs and symptoms of pneumothorax and, however unlikely, to see immediate medical attention should they occur. Patient was also educated on signs and symptoms of infection and to seek medical attention should they occur. Patient verbalized understanding of these instructions and education. ? ? ?  [facility]                                                [facility]: [facility] ?Therapeutic Exercise: ?NuStep L6 UE and LE for 5 min  ?Scapular ROM (rolls)  ?Standing cane flexion, extension , IR AAROM ?AROM measured shoulder 140 deg pain in upper traps ?AROM cervical flex/ext ?Seated upper trap stretch and extension used towel to anchor shoulder and support neck  ?SNAG rotation to Rt neck towel x 3 each side ?Double arm ball press x 10  ?Ball bridges x 10  ?Ball trunk rotation x 10  ?Overhead lift red looped band x  10  ?Knee to chest x 1 min each  LE  ?Figure 4 knee press and pull x 5 each  ? ? ?[facility]                                                DATE: 10/23/21 ?Therapeutic Exercise: ?Thoracic extension in chair with pec stretch 5

## 2021-10-31 ENCOUNTER — Other Ambulatory Visit: Payer: Self-pay

## 2021-10-31 ENCOUNTER — Ambulatory Visit: Payer: Medicare HMO | Admitting: Physical Therapy

## 2021-10-31 ENCOUNTER — Ambulatory Visit: Payer: Medicare HMO

## 2021-10-31 DIAGNOSIS — M6281 Muscle weakness (generalized): Secondary | ICD-10-CM

## 2021-10-31 DIAGNOSIS — M25511 Pain in right shoulder: Secondary | ICD-10-CM | POA: Diagnosis not present

## 2021-10-31 DIAGNOSIS — M5412 Radiculopathy, cervical region: Secondary | ICD-10-CM | POA: Diagnosis not present

## 2021-10-31 DIAGNOSIS — M542 Cervicalgia: Secondary | ICD-10-CM

## 2021-10-31 DIAGNOSIS — M25512 Pain in left shoulder: Secondary | ICD-10-CM | POA: Diagnosis not present

## 2021-10-31 DIAGNOSIS — G8929 Other chronic pain: Secondary | ICD-10-CM

## 2021-10-31 DIAGNOSIS — M47812 Spondylosis without myelopathy or radiculopathy, cervical region: Secondary | ICD-10-CM | POA: Diagnosis not present

## 2021-11-03 ENCOUNTER — Other Ambulatory Visit: Payer: Self-pay | Admitting: Internal Medicine

## 2021-11-03 DIAGNOSIS — G8929 Other chronic pain: Secondary | ICD-10-CM

## 2021-11-03 DIAGNOSIS — M47812 Spondylosis without myelopathy or radiculopathy, cervical region: Secondary | ICD-10-CM | POA: Diagnosis not present

## 2021-11-04 ENCOUNTER — Ambulatory Visit: Payer: Medicare HMO | Admitting: Physical Therapy

## 2021-11-04 ENCOUNTER — Telehealth: Payer: Self-pay | Admitting: Physical Therapy

## 2021-11-04 NOTE — Therapy (Deleted)
?OUTPATIENT PHYSICAL THERAPY TREATMENT NOTE ? ? ?Patient Name: Jesus Martin ?MRN: [id number] ?DOB:[birth year], 61 y.o., male ?Today's Date: 11/04/2021 ? ?PCP: Christiana Fuchs, DO ?REFERRING PROVIDER: MastersJoellen Jersey, DO ? ? ? ? ? ? ? ?Past Medical History:  ?Diagnosis Date  ? Beta-hemolytic group A streptococcal sepsis ([place])   ? Bipolar I disorder ([place]) 1991  ? Dr. Toy Care  ? Borderline hyperlipidemia   ? history of  ? Childhood asthma   ? COPD (chronic obstructive pulmonary disease) ([place])   ? Diabetes mellitus, new onset ([place]) 12/22/2017  ? Elevated glucose   ? Fibromyalgia   ? GERD (gastroesophageal reflux disease)   ? HTN (hypertension)   ? Low vitamin B12 level 06/07/2018  ? Lower back pain   ? Lumbar vertebral fracture ([place]) 01/29/2019  ? L1  ? Paranoia ([place])   ? Tobacco abuse   ? Tobacco abuse   ? Vitamin D deficiency   ? ?Past Surgical History:  ?Procedure Laterality Date  ? APPENDECTOMY    ? 1975  ? BACK SURGERY    ? 1998  ? NOSE SURGERY    ? ?Patient Active Problem List  ? Diagnosis Date Noted  ? Cervical spondylosis 02/27/2021  ? Rash 07/10/2020  ? Subclinical hyperthyroidism 04/25/2020  ? Tobacco use 04/16/2020  ? Multiple pulmonary nodules 04/16/2020  ? Fatigue 04/16/2020  ? Chest pain 12/12/2019  ? Compression fracture of spine ([place]) 01/30/2019  ? Hyperlipidemia 09/26/2018  ? Foot pain, bilateral 09/26/2018  ? Diabetic neuropathy ([place]) 12/22/2017  ? Chronic pain 11/24/2017  ? Hepatic steatosis 11/24/2017  ? COPD (chronic obstructive pulmonary disease) ([place]) 11/24/2017  ? Diabetes ([place])   ? Bipolar 1 disorder ([place]) 04/17/2011  ? Hypertension 04/17/2011  ? ?PCP: Christiana Fuchs, DO ?  ?REFERRING PROVIDER: Axel Filler,* ?  ?REFERRING DIAG: [phone] (ICD-10-CM) - Cervical spondylosis M54.12 (ICD-10-CM) - Cervical radiculopathy  ? ?THERAPY DIAG:  ?No diagnosis found. ? ?PERTINENT HISTORY: cervical spondylosis, arthropathy of cervical facet joint, and axial and myofascial pain ?Lumbar fracture with  kyphoplasty (about 3 yrs ago) ?Diabetes, HTN, COPD ?Bipolar disorder ? ?PRECAUTIONS: None ? ?SUBJECTIVE:  ?Pt reports he feels better the day after PT, but the relief does not last. ? ?*** ? ?PAIN:  ?Are you having pain? Yes ?NPRS scale: 5/10,  ?Pain location: mid back to periscap  ?Pain orientation: Left, Bilateral, Posterior, and Upper  ?PAIN TYPE: aching and stabbing and pressure ?Pain description: constant and stabbing  ?Aggravating factors: turning his head  ?Relieving factors: limited relief with meds or any ?other measures  ? ? ?OBJECTIVE:  ?  ?DIAGNOSTIC FINDINGS:  ?Cervical spondylosis as outlined.  ? ?Levels of mild spinal canal narrowing. Most notably at C4-C5, a  ?small central disc protrusion contributes to mild spinal canal  ?narrowing, and may contact the ventral spinal cord.  ? ?Multilevel foraminal stenosis, as detailed and greatest on the right  ?at C2-C3 (moderate), on the right at C4-C5 (moderate/severe),  ?bilaterally at C5-C6 (severe right, moderate/severe left) and  ?bilaterally at C6-C7 (severe right, moderate left).  ? ?Disc degeneration is greatest at C5-C6 (moderate) and C6-C7  ?(moderate to moderately advanced). Associated mild degenerative  ?endplate edema at these levels.  ? ?Straightening of the expected cervical lordosis.   ?  ?PATIENT SURVEYS:  ?FOTO 51% ?  ?  ?COGNITION: ?         Overall cognitive status: Within functional limits for tasks assessed              ?          ?  SENSATION: ?         Light touch: Appears intact ?         Stereognosis: Appears intact ?         Hot/Cold: Appears intact ?         Proprioception: Appears intact ?  ?POSTURE:  ?Forward head, scapula abducted from midline and GH joint internal rotation ?  ?PALPATION: ?Pain along bilateral occipitals and extending laterally to upper traps ?  ?CERVICAL AROM/PROM ?  ?A/PROM A/PROM (deg) ?09/23/2021 AROM ?10/28/21  ?Flexion 25 30  ?Extension 30 10  ?Right lateral flexion 20 32  ?Left lateral flexion 20 32  ?Right  rotation 40   ?Left rotation 40   ? (Blank rows = not tested) ?  ?UE AROM/PROM: ?  ?A/PROM Right ?09/23/2021 Left ?09/23/2021 10/28/21 10/28/21  ?Shoulder flexion AROM 110 approx.  AROM 110 approx.  145 145  ?Shoulder extension        ?Shoulder abduction 100 approx.  100 approx     ?Shoulder adduction        ?Shoulder extension        ?Shoulder internal rotation pain Pain     ?Shoulder external rotation pain Pain     ? (Blank rows = not tested) ?  ?UE MMT: ?  ?MMT Right ?09/23/2021 Left ?09/23/2021  ?Shoulder flexion 5/5 5/5  ?Shoulder extension      ?Shoulder abduction 4+/5 4+/5  ?Shoulder adduction      ?Shoulder extension      ?Shoulder internal rotation      ?Shoulder external rotation      ?Middle trapezius      ?Lower trapezius      ?Elbow flexion 5/5 5/5  ?Elbow extension 4/5 4/5  ?Wrist flexion      ?Wrist extension      ?Wrist ulnar deviation      ?Wrist radial deviation      ?Wrist pronation      ?Wrist supination      ?Grip strength 46 lb due to arthritis  75 lbs  ? (Blank rows = not tested) ?  ?CERVICAL SPECIAL TESTS:  ?NT    ?  ?LUMBAR SPECIAL TESTS:  ?NT ?  ?FUNCTIONAL TESTS:  ?NT ?  ?GAIT: ?Distance walked: 150 ?Assistive device utilized: None ?Level of assistance: Modified independence ?Comments: not assessed ? ?[facility] Adult PT Treatment:                                                DATE: 11/04/21 ?Therapeutic Exercise: ?*** ?Manual Therapy: ?*** ?Neuromuscular re-ed: ?*** ?Therapeutic Activity: ?*** ?Modalities: ?*** ?Self Care: ?***  ? ?[facility] Adult PT Treatment:                                                DATE: 10/31/21 ?Therapeutic Exercise: ?Supine cervical retraction x10 5" ?Sitting cervical retractions x10 5" ?Sitting rotation 10x 5" c over pressure L and R  ?Shoulder hor. Abd 2x10 GTB ?Shoulder ER 2x10 L and R GTB ? ?Manual Therapy: ?STM and DTM to the cervical paraspinals, upper traps and levator ?Manual cervical traction 15" on, 10" off ? ?Trigger Point Dry Needling Treatment: ?Pre-treatment  instruction: Patient instructed on dry needling rationale, procedures,  and possible side effects including pain during treatment (achy,cramping feeling), bruising, drop of blood, lightheadedness, nausea, sweating. ?Patient Consent Given: Yes ?Education handout provided: Previously provided ?Muscles treated: Bilat upper trap, C4-C6 multifidi and spinalis cervicis  ?Needle size and number: .30x87m x 2  ?Electrical stimulation performed: No ?Parameters: N/A ?Treatment response/outcome: Twitch response elicited and Palpable decrease in muscle tension ?Post-treatment instructions: Patient instructed to expect possible mild to moderate muscle soreness later today and/or tomorrow. Patient instructed in methods to reduce muscle soreness and to continue prescribed HEP. If patient was dry needled over the lung field, patient was instructed on signs and symptoms of pneumothorax and, however unlikely, to see immediate medical attention should they occur. Patient was also educated on signs and symptoms of infection and to seek medical attention should they occur. Patient verbalized understanding of these instructions and education. ? ? ?  [facility]                                                [facility]: [facility] ?Therapeutic Exercise: ?NuStep L6 UE and LE for 5 min  ?Scapular ROM (rolls)  ?Standing cane flexion, extension , IR AAROM ?AROM measured shoulder 140 deg pain in upper traps ?AROM cervical flex/ext ?Seated upper trap stretch and extension used towel to anchor shoulder and support neck  ?SNAG rotation to Rt neck towel x 3 each side ?Double arm ball press x 10  ?Ball bridges x 10  ?Ball trunk rotation x 10  ?Overhead lift red looped band x  10  ?Knee to chest x 1 min each LE  ?Figure 4 knee press and pull x 5 each  ? ? ?[facility]                                                [facility]: 10/23/21 ?Therapeutic Exercise: ?Thoracic extension in chair with pec stretch 5 sec x 10 ?Open books modified to  hand on chest x 5 each  ?Supine Protraction AROM bilateral x 15 ?Supine Horizontal pull green TB x 15 ?Supine ER unattached green TB x 10 x 2  ?Supine Diagonal pull green band alternating  x 10  ?Seated scap squeezes

## 2021-11-04 NOTE — Telephone Encounter (Signed)
Called patient regarding this AM missed appt.  He received an injection yesterday and was asked to take it easy today.  His spouse forgot to call and cancel the appt.   ?Rescheduled appt for tomorrow , 10:15 AM with Hessie Diener, PTA  ? ?Raeford Razor, PT ?11/04/21 9:58 AM ?Phone: [phone] ?Fax: [phone]  ?

## 2021-11-05 ENCOUNTER — Encounter: Payer: Self-pay | Admitting: Physical Therapy

## 2021-11-05 ENCOUNTER — Other Ambulatory Visit: Payer: Self-pay

## 2021-11-05 ENCOUNTER — Ambulatory Visit: Payer: Medicare HMO | Admitting: Physical Therapy

## 2021-11-05 DIAGNOSIS — M542 Cervicalgia: Secondary | ICD-10-CM

## 2021-11-05 DIAGNOSIS — M6281 Muscle weakness (generalized): Secondary | ICD-10-CM

## 2021-11-05 DIAGNOSIS — M47812 Spondylosis without myelopathy or radiculopathy, cervical region: Secondary | ICD-10-CM | POA: Diagnosis not present

## 2021-11-05 DIAGNOSIS — M25511 Pain in right shoulder: Secondary | ICD-10-CM | POA: Diagnosis not present

## 2021-11-05 DIAGNOSIS — M5412 Radiculopathy, cervical region: Secondary | ICD-10-CM | POA: Diagnosis not present

## 2021-11-05 DIAGNOSIS — G8929 Other chronic pain: Secondary | ICD-10-CM

## 2021-11-05 DIAGNOSIS — M25512 Pain in left shoulder: Secondary | ICD-10-CM | POA: Diagnosis not present

## 2021-11-05 NOTE — Therapy (Signed)
?OUTPATIENT PHYSICAL THERAPY TREATMENT NOTE ? ? ?Patient Name: Jesus Martin ?MRN: [id number] ?DOB:[birth year], 61 y.o., male ?Today's Date: 11/05/2021 ? ?PCP: Christiana Fuchs, DO ?REFERRING PROVIDER: Axel Filler,* ? ? PT End of Session - 11/05/21 1019   ? ? Visit Number 7   ? Number of Visits 16   ? Date for PT Re-Evaluation 12/04/21   ? Authorization Type [employer]   ? PT Start Time 1016   ? PT Stop Time 1058   ? PT Time Calculation (min) 42 min   ? ?  ?  ? ?  ? ? ? ? ? ?Past Medical History:  ?Diagnosis Date  ? Beta-hemolytic group A streptococcal sepsis ([place])   ? Bipolar I disorder ([place]) 1991  ? Dr. Toy Care  ? Borderline hyperlipidemia   ? history of  ? Childhood asthma   ? COPD (chronic obstructive pulmonary disease) ([place])   ? Diabetes mellitus, new onset ([place]) 12/22/2017  ? Elevated glucose   ? Fibromyalgia   ? GERD (gastroesophageal reflux disease)   ? HTN (hypertension)   ? Low vitamin B12 level 06/07/2018  ? Lower back pain   ? Lumbar vertebral fracture ([place]) 01/29/2019  ? L1  ? Paranoia ([place])   ? Tobacco abuse   ? Tobacco abuse   ? Vitamin D deficiency   ? ?Past Surgical History:  ?Procedure Laterality Date  ? APPENDECTOMY    ? 1975  ? BACK SURGERY    ? 1998  ? NOSE SURGERY    ? ?Patient Active Problem List  ? Diagnosis Date Noted  ? Cervical spondylosis 02/27/2021  ? Rash 07/10/2020  ? Subclinical hyperthyroidism 04/25/2020  ? Tobacco use 04/16/2020  ? Multiple pulmonary nodules 04/16/2020  ? Fatigue 04/16/2020  ? Chest pain 12/12/2019  ? Compression fracture of spine ([place]) 01/30/2019  ? Hyperlipidemia 09/26/2018  ? Foot pain, bilateral 09/26/2018  ? Diabetic neuropathy ([place]) 12/22/2017  ? Chronic pain 11/24/2017  ? Hepatic steatosis 11/24/2017  ? COPD (chronic obstructive pulmonary disease) ([place]) 11/24/2017  ? Diabetes ([place])   ? Bipolar 1 disorder ([place]) 04/17/2011  ? Hypertension 04/17/2011  ? ?PCP: Christiana Fuchs, DO ?  ?REFERRING PROVIDER: Axel Filler,* ?  ?REFERRING DIAG:  [phone] (ICD-10-CM) - Cervical spondylosis M54.12 (ICD-10-CM) - Cervical radiculopathy  ? ?THERAPY DIAG:  ?Cervicalgia ? ?Chronic left shoulder pain ? ?Chronic right shoulder pain ? ?Muscle weakness (generalized) ? ?PERTINENT HISTORY: cervical spondylosis, arthropathy of cervical facet joint, and axial and myofascial pain ?Lumbar fracture with kyphoplasty (about 3 yrs ago) ?Diabetes, HTN, COPD ?Bipolar disorder ? ?PRECAUTIONS: None ? ?SUBJECTIVE:  ?Injections helped for 5 hours at 50-60% relief. Pain has returned in full force.  ? ?PAIN:  ?Are you having pain? Yes ?NPRS scale: 5/10,  ?Pain location: mid back to periscap  ?Pain orientation: Left, Bilateral, Posterior, and Upper  ?PAIN TYPE: aching and stabbing and pressure ?Pain description: constant and stabbing  ?Aggravating factors: turning his head  ?Relieving factors: limited relief with meds or any ?other measures  ? ? ?OBJECTIVE:  ?  ?DIAGNOSTIC FINDINGS:  ?Cervical spondylosis as outlined.  ? ?Levels of mild spinal canal narrowing. Most notably at C4-C5, a  ?small central disc protrusion contributes to mild spinal canal  ?narrowing, and may contact the ventral spinal cord.  ? ?Multilevel foraminal stenosis, as detailed and greatest on the right  ?at C2-C3 (moderate), on the right at C4-C5 (moderate/severe),  ?bilaterally at C5-C6 (severe right, moderate/severe left) and  ?bilaterally at C6-C7 (severe  right, moderate left).  ? ?Disc degeneration is greatest at C5-C6 (moderate) and C6-C7  ?(moderate to moderately advanced). Associated mild degenerative  ?endplate edema at these levels.  ? ?Straightening of the expected cervical lordosis.   ?  ?PATIENT SURVEYS:  ?FOTO 51% ?  ?  ?COGNITION: ?         Overall cognitive status: Within functional limits for tasks assessed              ?          ?SENSATION: ?         Light touch: Appears intact ?         Stereognosis: Appears intact ?         Hot/Cold: Appears intact ?         Proprioception: Appears intact ?   ?POSTURE:  ?Forward head, scapula abducted from midline and GH joint internal rotation ?  ?PALPATION: ?Pain along bilateral occipitals and extending laterally to upper traps ?  ?CERVICAL AROM/PROM ?  ?A/PROM A/PROM (deg) ?09/23/2021 AROM ?10/28/21  ?Flexion 25 30  ?Extension 30 10  ?Right lateral flexion 20 32  ?Left lateral flexion 20 32  ?Right rotation 40   ?Left rotation 40   ? (Blank rows = not tested) ?  ?UE AROM/PROM: ?  ?A/PROM Right ?09/23/2021 Left ?09/23/2021 10/28/21 10/28/21  ?Shoulder flexion AROM 110 approx.  AROM 110 approx.  145 145  ?Shoulder extension        ?Shoulder abduction 100 approx.  100 approx     ?Shoulder adduction        ?Shoulder extension        ?Shoulder internal rotation pain Pain     ?Shoulder external rotation pain Pain     ? (Blank rows = not tested) ?  ?UE MMT: ?  ?MMT Right ?09/23/2021 Left ?09/23/2021  ?Shoulder flexion 5/5 5/5  ?Shoulder extension      ?Shoulder abduction 4+/5 4+/5  ?Shoulder adduction      ?Shoulder extension      ?Shoulder internal rotation      ?Shoulder external rotation      ?Middle trapezius      ?Lower trapezius      ?Elbow flexion 5/5 5/5  ?Elbow extension 4/5 4/5  ?Wrist flexion      ?Wrist extension      ?Wrist ulnar deviation      ?Wrist radial deviation      ?Wrist pronation      ?Wrist supination      ?Grip strength 46 lb due to arthritis  75 lbs  ? (Blank rows = not tested) ?  ?CERVICAL SPECIAL TESTS:  ?NT    ?  ?LUMBAR SPECIAL TESTS:  ?NT ?  ?FUNCTIONAL TESTS:  ?NT ?  ?GAIT: ?Distance walked: 150 ?Assistive device utilized: None ?Level of assistance: Modified independence ?Comments: not assessed ? ?[facility] Adult PT Treatment:                                                DATE: 11/05/21 ?Therapeutic Exercise: ?NuStep L6 UE and LE for 5 min  ?Scap rows and ext green x 20 each ?Lower trap activation with Ys facing wall ?Pec stretch in doorway  ?Scaption bilateral 10 x 2  ?Pulleys for forward flexion -cues for  ?Supine Bridge x 10 ?Bridge with dowel  pullover-  cues for breathing.  ? ? ?[facility] Adult PT Treatment:                                                DATE: 10/31/21 ?Therapeutic Exercise: ?Supine cervical retraction x10 5" ?Sitting cervical retractions x10 5" ?Sitting rotation 10x 5" c over pressure L and R  ?Shoulder hor. Abd 2x10 GTB ?Shoulder ER 2x10 L and R GTB ? ?Manual Therapy: ?STM and DTM to the cervical paraspinals, upper traps and levator ?Manual cervical traction 15" on, 10" off ? ?Trigger Point Dry Needling Treatment: ?Pre-treatment instruction: Patient instructed on dry needling rationale, procedures, and possible side effects including pain during treatment (achy,cramping feeling), bruising, drop of blood, lightheadedness, nausea, sweating. ?Patient Consent Given: Yes ?Education handout provided: Previously provided ?Muscles treated: Bilat upper trap, C4-C6 multifidi and spinalis cervicis  ?Needle size and number: .30x21m x 2  ?Electrical stimulation performed: No ?Parameters: N/A ?Treatment response/outcome: Twitch response elicited and Palpable decrease in muscle tension ?Post-treatment instructions: Patient instructed to expect possible mild to moderate muscle soreness later today and/or tomorrow. Patient instructed in methods to reduce muscle soreness and to continue prescribed HEP. If patient was dry needled over the lung field, patient was instructed on signs and symptoms of pneumothorax and, however unlikely, to see immediate medical attention should they occur. Patient was also educated on signs and symptoms of infection and to seek medical attention should they occur. Patient verbalized understanding of these instructions and education. ? ? ?  [facility]                                                DATE: 10/28/21 ?Therapeutic Exercise: ?NuStep L6 UE and LE for 5 min  ?Scapular ROM (rolls)  ?Standing cane flexion, extension , IR AAROM ?AROM measured shoulder 140 deg pain in upper traps ?AROM cervical flex/ext ?Seated  upper trap stretch and extension used towel to anchor shoulder and support neck  ?SNAG rotation to Rt neck towel x 3 each side ?Double arm ball press x 10  ?Ball bridges x 10  ?Ball trunk rotation x 10  ?Overhead lift red

## 2021-11-06 ENCOUNTER — Ambulatory Visit: Payer: Medicare HMO

## 2021-11-08 ENCOUNTER — Other Ambulatory Visit: Payer: Self-pay | Admitting: Internal Medicine

## 2021-11-10 ENCOUNTER — Other Ambulatory Visit: Payer: Self-pay | Admitting: Internal Medicine

## 2021-11-10 DIAGNOSIS — E119 Type 2 diabetes mellitus without complications: Secondary | ICD-10-CM

## 2021-11-12 DIAGNOSIS — H25013 Cortical age-related cataract, bilateral: Secondary | ICD-10-CM | POA: Diagnosis not present

## 2021-11-12 DIAGNOSIS — E119 Type 2 diabetes mellitus without complications: Secondary | ICD-10-CM | POA: Diagnosis not present

## 2021-11-12 LAB — HM DIABETES EYE EXAM

## 2021-11-17 DIAGNOSIS — Z794 Long term (current) use of insulin: Secondary | ICD-10-CM | POA: Diagnosis not present

## 2021-11-17 DIAGNOSIS — H04123 Dry eye syndrome of bilateral lacrimal glands: Secondary | ICD-10-CM | POA: Diagnosis not present

## 2021-11-17 DIAGNOSIS — E119 Type 2 diabetes mellitus without complications: Secondary | ICD-10-CM | POA: Diagnosis not present

## 2021-11-17 DIAGNOSIS — H2511 Age-related nuclear cataract, right eye: Secondary | ICD-10-CM | POA: Diagnosis not present

## 2021-11-17 DIAGNOSIS — H2513 Age-related nuclear cataract, bilateral: Secondary | ICD-10-CM | POA: Diagnosis not present

## 2021-11-19 ENCOUNTER — Ambulatory Visit: Payer: Medicare HMO | Admitting: Physical Therapy

## 2021-11-20 ENCOUNTER — Encounter: Payer: Self-pay | Admitting: Physical Therapy

## 2021-11-20 ENCOUNTER — Ambulatory Visit
Payer: Medicare HMO | Attending: Student in an Organized Health Care Education/Training Program | Admitting: Physical Therapy

## 2021-11-20 DIAGNOSIS — M542 Cervicalgia: Secondary | ICD-10-CM

## 2021-11-20 DIAGNOSIS — G8929 Other chronic pain: Secondary | ICD-10-CM

## 2021-11-20 DIAGNOSIS — M6281 Muscle weakness (generalized): Secondary | ICD-10-CM

## 2021-11-20 DIAGNOSIS — M25511 Pain in right shoulder: Secondary | ICD-10-CM | POA: Insufficient documentation

## 2021-11-20 DIAGNOSIS — M25512 Pain in left shoulder: Secondary | ICD-10-CM | POA: Diagnosis not present

## 2021-11-20 NOTE — Therapy (Addendum)
?OUTPATIENT PHYSICAL THERAPY TREATMENT NOTE ? ? ?Patient Name: Jesus Martin ?MRN: [id number] ?DOB:12-Oct-1960, 61 y.o., male ?Today's Date: 11/20/2021 ? ?PCP: Christiana Fuchs, DO ?REFERRING PROVIDER: Christiana Fuchs, DO ? ? PT End of Session - 11/20/21 [phone]   ? ? Visit Number 8   ? Number of Visits 16   ? Date for PT Re-Evaluation 12/04/21   ? Authorization Type [employer]   ? Authorization - Visit Number 8   ? Authorization - Number of Visits 12   ? PT Start Time 0845   ? PT Stop Time 0930   ? PT Time Calculation (min) 45 min   ? ?  ?  ? ?  ? ? ? ? ? ?Past Medical History:  ?Diagnosis Date  ? Beta-hemolytic group A streptococcal sepsis ([place])   ? Bipolar I disorder ([place]) 1991  ? Dr. Toy Care  ? Borderline hyperlipidemia   ? history of  ? Childhood asthma   ? COPD (chronic obstructive pulmonary disease) ([place])   ? Diabetes mellitus, new onset ([place]) 12/22/2017  ? Elevated glucose   ? Fibromyalgia   ? GERD (gastroesophageal reflux disease)   ? HTN (hypertension)   ? Low vitamin B12 level 06/07/2018  ? Lower back pain   ? Lumbar vertebral fracture ([place]) 01/29/2019  ? L1  ? Paranoia ([place])   ? Tobacco abuse   ? Tobacco abuse   ? Vitamin D deficiency   ? ?Past Surgical History:  ?Procedure Laterality Date  ? APPENDECTOMY    ? 1975  ? BACK SURGERY    ? 1998  ? NOSE SURGERY    ? ?Patient Active Problem List  ? Diagnosis Date Noted  ? Cervical spondylosis 02/27/2021  ? Rash 07/10/2020  ? Subclinical hyperthyroidism 04/25/2020  ? Tobacco use 04/16/2020  ? Multiple pulmonary nodules 04/16/2020  ? Fatigue 04/16/2020  ? Chest pain 12/12/2019  ? Compression fracture of spine ([place]) 01/30/2019  ? Hyperlipidemia 09/26/2018  ? Foot pain, bilateral 09/26/2018  ? Diabetic neuropathy ([place]) 12/22/2017  ? Chronic pain 11/24/2017  ? Hepatic steatosis 11/24/2017  ? COPD (chronic obstructive pulmonary disease) ([place]) 11/24/2017  ? Diabetes ([place])   ? Bipolar 1 disorder ([place]) 04/17/2011  ? Hypertension 04/17/2011  ? ?PCP: Christiana Fuchs, DO ?   ?REFERRING PROVIDER: Axel Filler,* ?  ?REFERRING DIAG: [phone] (ICD-10-CM) - Cervical spondylosis M54.12 (ICD-10-CM) - Cervical radiculopathy  ? ?THERAPY DIAG:  ?Cervicalgia ? ?Chronic right shoulder pain ? ?Chronic left shoulder pain ? ?Muscle weakness (generalized) ? ?PERTINENT HISTORY: cervical spondylosis, arthropathy of cervical facet joint, and axial and myofascial pain ?Lumbar fracture with kyphoplasty (about 3 yrs ago) ?Diabetes, HTN, COPD ?Bipolar disorder ? ?PRECAUTIONS: None ? ?SUBJECTIVE:  ?Neck is still better but the mid back /shoulder blades are hurting the most. I can tell I have not been here for the last 2 weeks because I have started getting more tight.  ? ?PAIN:  ?Are you having pain? Yes ?NPRS scale: 5/10,  ?Pain location: mid back to periscap  ?Pain orientation: Left, Bilateral, Posterior, and Upper  ?PAIN TYPE: aching and stabbing and pressure ?Pain description: constant and stabbing  ?Aggravating factors: turning his head  ?Relieving factors: limited relief with meds or any ?other measures  ? ? ?OBJECTIVE:  ?  ?DIAGNOSTIC FINDINGS:  ?Cervical spondylosis as outlined.  ? ?Levels of mild spinal canal narrowing. Most notably at C4-C5, a  ?small central disc protrusion contributes to mild spinal canal  ?narrowing, and may contact the ventral spinal  cord.  ? ?Multilevel foraminal stenosis, as detailed and greatest on the right  ?at C2-C3 (moderate), on the right at C4-C5 (moderate/severe),  ?bilaterally at C5-C6 (severe right, moderate/severe left) and  ?bilaterally at C6-C7 (severe right, moderate left).  ? ?Disc degeneration is greatest at C5-C6 (moderate) and C6-C7  ?(moderate to moderately advanced). Associated mild degenerative  ?endplate edema at these levels.  ? ?Straightening of the expected cervical lordosis.   ?  ?PATIENT SURVEYS:  ?FOTO 51% ?  ?  ?COGNITION: ?         Overall cognitive status: Within functional limits for tasks assessed              ?          ?SENSATION: ?          Light touch: Appears intact ?         Stereognosis: Appears intact ?         Hot/Cold: Appears intact ?         Proprioception: Appears intact ?  ?POSTURE:  ?Forward head, scapula abducted from midline and GH joint internal rotation ?  ?PALPATION: ?Pain along bilateral occipitals and extending laterally to upper traps ?  ?CERVICAL AROM/PROM ?  ?A/PROM A/PROM (deg) ?09/23/2021 AROM ?10/28/21  ?Flexion 25 30  ?Extension 30 10  ?Right lateral flexion 20 32  ?Left lateral flexion 20 32  ?Right rotation 40   ?Left rotation 40   ? (Blank rows = not tested) ?  ?UE AROM/PROM: ?  ?A/PROM Right ?09/23/2021 Left ?09/23/2021 10/28/21 10/28/21  ?Shoulder flexion AROM 110 approx.  AROM 110 approx.  145 145  ?Shoulder extension        ?Shoulder abduction 100 approx.  100 approx     ?Shoulder adduction        ?Shoulder extension        ?Shoulder internal rotation pain Pain     ?Shoulder external rotation pain Pain     ? (Blank rows = not tested) ?  ?UE MMT: ?  ?MMT Right ?09/23/2021 Left ?09/23/2021  ?Shoulder flexion 5/5 5/5  ?Shoulder extension      ?Shoulder abduction 4+/5 4+/5  ?Shoulder adduction      ?Shoulder extension      ?Shoulder internal rotation      ?Shoulder external rotation      ?Middle trapezius      ?Lower trapezius      ?Elbow flexion 5/5 5/5  ?Elbow extension 4/5 4/5  ?Wrist flexion      ?Wrist extension      ?Wrist ulnar deviation      ?Wrist radial deviation      ?Wrist pronation      ?Wrist supination      ?Grip strength 46 lb due to arthritis  75 lbs  ? (Blank rows = not tested) ?  ?CERVICAL SPECIAL TESTS:  ?NT    ?  ?LUMBAR SPECIAL TESTS:  ?NT ?  ?FUNCTIONAL TESTS:  ?NT ?  ?GAIT: ?Distance walked: 150 ?Assistive device utilized: None ?Level of assistance: Modified independence ?Comments: not assessed ? ?[facility] Adult PT Treatment:                                                DATE: 11/20/21 ?Therapeutic Exercise: ?NuStep L6 UE and LE for 5 min  ?Scap rows and ext blue  x 20 each ?Lower trap activation with Ys  facing wall ?Pec stretch in doorway  ?Supine horizontal abduction green x 20  ?Supine bilat shoulder ER x 20  ?Supine green band diagonals x 15 each ?3# protraction bilateral supine x 15  ?LTR x 10  ?Supine Bridge x 10 ? ? ?[facility] Adult PT Treatment:                                                DATE: 11/05/21 ?Therapeutic Exercise: ?NuStep L6 UE and LE for 5 min  ?Scap rows and ext green x 20 each ?Lower trap activation with Ys facing wall ?Pec stretch in doorway  ?Scaption bilateral 10 x 2  ?Pulleys for forward flexion -cues for  ?Supine Bridge x 10 ?Bridge with dowel pullover- cues for breathing.  ? ? ?[facility] Adult PT Treatment:                                                DATE: 10/31/21 ?Therapeutic Exercise: ?Supine cervical retraction x10 5" ?Sitting cervical retractions x10 5" ?Sitting rotation 10x 5" c over pressure L and R  ?Shoulder hor. Abd 2x10 GTB ?Shoulder ER 2x10 L and R GTB ? ?Manual Therapy: ?STM and DTM to the cervical paraspinals, upper traps and levator ?Manual cervical traction 15" on, 10" off ? ?Trigger Point Dry Needling Treatment: ?Pre-treatment instruction: Patient instructed on dry needling rationale, procedures, and possible side effects including pain during treatment (achy,cramping feeling), bruising, drop of blood, lightheadedness, nausea, sweating. ?Patient Consent Given: Yes ?Education handout provided: Previously provided ?Muscles treated: Bilat upper trap, C4-C6 multifidi and spinalis cervicis  ?Needle size and number: .30x71m x 2  ?Electrical stimulation performed: No ?Parameters: N/A ?Treatment response/outcome: Twitch response elicited and Palpable decrease in muscle tension ?Post-treatment instructions: Patient instructed to expect possible mild to moderate muscle soreness later today and/or tomorrow. Patient instructed in methods to reduce muscle soreness and to continue prescribed HEP. If patient was dry needled over the lung field, patient was instructed on signs and symptoms  of pneumothorax and, however unlikely, to see immediate medical attention should they occur. Patient was also educated on signs and symptoms of infection and to seek medical attention should they occur. P

## 2021-11-25 ENCOUNTER — Encounter: Payer: Self-pay | Admitting: Physical Therapy

## 2021-11-25 ENCOUNTER — Ambulatory Visit: Payer: Medicare HMO | Admitting: Physical Therapy

## 2021-11-25 DIAGNOSIS — M542 Cervicalgia: Secondary | ICD-10-CM | POA: Diagnosis not present

## 2021-11-25 DIAGNOSIS — M25512 Pain in left shoulder: Secondary | ICD-10-CM | POA: Diagnosis not present

## 2021-11-25 DIAGNOSIS — M6281 Muscle weakness (generalized): Secondary | ICD-10-CM

## 2021-11-25 DIAGNOSIS — M25511 Pain in right shoulder: Secondary | ICD-10-CM | POA: Diagnosis not present

## 2021-11-25 DIAGNOSIS — G8929 Other chronic pain: Secondary | ICD-10-CM | POA: Diagnosis not present

## 2021-11-25 NOTE — Therapy (Signed)
?OUTPATIENT PHYSICAL THERAPY TREATMENT NOTE ? ? ?Patient Name: RUEBEN KASSIM ?MRN: [id number] ?DOB:08/26/1960, 61 y.o., male ?Today's Date: 11/25/2021 ? ?PCP: Christiana Fuchs, DO ?REFERRING PROVIDER: Christiana Fuchs, DO ? ? PT End of Session - 11/25/21 1019   ? ? Visit Number 9   ? Number of Visits 16   ? Date for PT Re-Evaluation 12/04/21   ? Authorization Type [employer]   ? Authorization - Visit Number 9   ? Authorization - Number of Visits 12   ? PT Start Time 1015   ? PT Stop Time 1100   ? PT Time Calculation (min) 45 min   ? ?  ?  ? ?  ? ? ? ? ? ? ?Past Medical History:  ?Diagnosis Date  ? Beta-hemolytic group A streptococcal sepsis ([place])   ? Bipolar I disorder ([place]) 1991  ? Dr. Toy Care  ? Borderline hyperlipidemia   ? history of  ? Childhood asthma   ? COPD (chronic obstructive pulmonary disease) ([place])   ? Diabetes mellitus, new onset ([place]) 12/22/2017  ? Elevated glucose   ? Fibromyalgia   ? GERD (gastroesophageal reflux disease)   ? HTN (hypertension)   ? Low vitamin B12 level 06/07/2018  ? Lower back pain   ? Lumbar vertebral fracture ([place]) 01/29/2019  ? L1  ? Paranoia ([place])   ? Tobacco abuse   ? Tobacco abuse   ? Vitamin D deficiency   ? ?Past Surgical History:  ?Procedure Laterality Date  ? APPENDECTOMY    ? 1975  ? BACK SURGERY    ? 1998  ? NOSE SURGERY    ? ?Patient Active Problem List  ? Diagnosis Date Noted  ? Cervical spondylosis 02/27/2021  ? Rash 07/10/2020  ? Subclinical hyperthyroidism 04/25/2020  ? Tobacco use 04/16/2020  ? Multiple pulmonary nodules 04/16/2020  ? Fatigue 04/16/2020  ? Chest pain 12/12/2019  ? Compression fracture of spine ([place]) 01/30/2019  ? Hyperlipidemia 09/26/2018  ? Foot pain, bilateral 09/26/2018  ? Diabetic neuropathy ([place]) 12/22/2017  ? Chronic pain 11/24/2017  ? Hepatic steatosis 11/24/2017  ? COPD (chronic obstructive pulmonary disease) ([place]) 11/24/2017  ? Diabetes ([place])   ? Bipolar 1 disorder ([place]) 04/17/2011  ? Hypertension 04/17/2011  ? ?PCP: Christiana Fuchs, DO ?   ?REFERRING PROVIDER: Axel Filler,* ?  ?REFERRING DIAG: [phone] (ICD-10-CM) - Cervical spondylosis M54.12 (ICD-10-CM) - Cervical radiculopathy  ? ?THERAPY DIAG:  ?Cervicalgia ? ?Chronic right shoulder pain ? ?Chronic left shoulder pain ? ?Muscle weakness (generalized) ? ?PERTINENT HISTORY: cervical spondylosis, arthropathy of cervical facet joint, and axial and myofascial pain ?Lumbar fracture with kyphoplasty (about 3 yrs ago) ?Diabetes, HTN, COPD ?Bipolar disorder ? ?PRECAUTIONS: None ? ?SUBJECTIVE:  ?Pain is the normal 5-6/10 along the upper back and shoulder blades. No neck pain. I can tell over the last couple of weeks that the exercises do help ease the pain because the pain got worse when I wasn't doing the exercises.  ? ?PAIN:  ?Are you having pain? Yes ?NPRS scale: 5-6/10,  ?Pain location: mid back to periscap  ?Pain orientation: Left, Bilateral, Posterior, and Upper  ?PAIN TYPE: aching and stabbing and pressure ?Pain description: constant and stabbing  ?Aggravating factors: turning his head  ?Relieving factors: limited relief with meds or any ?other measures  ? ? ?OBJECTIVE:  ?  ?DIAGNOSTIC FINDINGS:  ?Cervical spondylosis as outlined.  ? ?Levels of mild spinal canal narrowing. Most notably at C4-C5, a  ?small central disc protrusion contributes to mild  spinal canal  ?narrowing, and may contact the ventral spinal cord.  ? ?Multilevel foraminal stenosis, as detailed and greatest on the right  ?at C2-C3 (moderate), on the right at C4-C5 (moderate/severe),  ?bilaterally at C5-C6 (severe right, moderate/severe left) and  ?bilaterally at C6-C7 (severe right, moderate left).  ? ?Disc degeneration is greatest at C5-C6 (moderate) and C6-C7  ?(moderate to moderately advanced). Associated mild degenerative  ?endplate edema at these levels.  ? ?Straightening of the expected cervical lordosis.   ?  ?PATIENT SURVEYS:  ?FOTO 42% status: 50%  11/25/21 ?  ?  ?COGNITION: ?         Overall cognitive status: Within  functional limits for tasks assessed              ?          ?SENSATION: ?         Light touch: Appears intact ?         Stereognosis: Appears intact ?         Hot/Cold: Appears intact ?         Proprioception: Appears intact ?  ?POSTURE:  ?Forward head, scapula abducted from midline and GH joint internal rotation ?  ?PALPATION: ?Pain along bilateral occipitals and extending laterally to upper traps ?  ?CERVICAL AROM/PROM ?  ?A/PROM A/PROM (deg) ?09/23/2021 AROM ?10/28/21 11/25/21 ?AROM  ?Flexion 25 30   ?Extension 30 10   ?Right lateral flexion 20 32   ?Left lateral flexion 20 32   ?Right rotation 40  50  ?Left rotation 40  60  ? (Blank rows = not tested) ?  ?UE AROM/PROM: ?  ?A/PROM Right ?09/23/2021 Left ?09/23/2021 10/28/21 10/28/21  ?Shoulder flexion AROM 110 approx.  AROM 110 approx.  145 145  ?Shoulder extension        ?Shoulder abduction 100 approx.  100 approx     ?Shoulder adduction        ?Shoulder extension        ?Shoulder internal rotation pain Pain     ?Shoulder external rotation pain Pain     ? (Blank rows = not tested) ?  ?UE MMT: ?  ?MMT Right ?09/23/2021 Left ?09/23/2021  ?Shoulder flexion 5/5 5/5  ?Shoulder extension      ?Shoulder abduction 4+/5 4+/5  ?Shoulder adduction      ?Shoulder extension      ?Shoulder internal rotation      ?Shoulder external rotation      ?Middle trapezius      ?Lower trapezius      ?Elbow flexion 5/5 5/5  ?Elbow extension 4/5 4/5  ?Wrist flexion      ?Wrist extension      ?Wrist ulnar deviation      ?Wrist radial deviation      ?Wrist pronation      ?Wrist supination      ?Grip strength 46 lb due to arthritis  75 lbs  ? (Blank rows = not tested) ?  ?CERVICAL SPECIAL TESTS:  ?NT    ?  ?LUMBAR SPECIAL TESTS:  ?NT ?  ?FUNCTIONAL TESTS:  ?NT ?  ?GAIT: ?Distance walked: 150 ?Assistive device utilized: None ?Level of assistance: Modified independence ?Comments: not assessed ? ?[facility] Adult PT Treatment:  DATE: 11/25/21 ?Therapeutic  Exercise: ?NuStep L6 UE and LE for 6 min  ?Scap rows and ext Black band x 20 each ?Standing horizontal abduction with anchored resistance red band  ?Standing blue band shoulder ER x 20  ?Pec stretch in doorway  ?Supine green band diagonals x 15 each ?3# protraction bilateral supine x 15  ?Standing Protract/retract- hands on wall x 10  ?LTR x 10  ?Supine Bridge x 10 ? ?[facility] Adult PT Treatment:                                                DATE: 11/20/21 ?Therapeutic Exercise: ?NuStep L6 UE and LE for 5 min  ?Scap rows and ext blue x 20 each ?Lower trap activation with Ys facing wall ?Pec stretch in doorway  ?Supine horizontal abduction green x 20  ?Supine bilat shoulder ER x 20  ?Supine green band diagonals x 15 each ?3# protraction bilateral supine x 15  ?LTR x 10  ?Supine Bridge x 10 ? ? ?[facility] Adult PT Treatment:                                                DATE: 11/05/21 ?Therapeutic Exercise: ?NuStep L6 UE and LE for 5 min  ?Scap rows and ext green x 20 each ?Lower trap activation with Ys facing wall ?Pec stretch in doorway  ?Scaption bilateral 10 x 2  ?Pulleys for forward flexion -cues for  ?Supine Bridge x 10 ?Bridge with dowel pullover- cues for breathing.  ? ? ?[facility] Adult PT Treatment:                                                DATE: 10/31/21 ?Therapeutic Exercise: ?Supine cervical retraction x10 5" ?Sitting cervical retractions x10 5" ?Sitting rotation 10x 5" c over pressure L and R  ?Shoulder hor. Abd 2x10 GTB ?Shoulder ER 2x10 L and R GTB ? ?Manual Therapy: ?STM and DTM to the cervical paraspinals, upper traps and levator ?Manual cervical traction 15" on, 10" off ? ?Trigger Point Dry Needling Treatment: ?Pre-treatment instruction: Patient instructed on dry needling rationale, procedures, and possible side effects including pain during treatment (achy,cramping feeling), bruising, drop of blood, lightheadedness, nausea, sweating. ?Patient Consent Given: Yes ?Education handout provided: Previously  provided ?Muscles treated: Bilat upper trap, C4-C6 multifidi and spinalis cervicis  ?Needle size and number: .30x63m x 2  ?Electrical stimulation performed: No ?Parameters: N/A ?Treatment response/outcome: Twitch r

## 2021-11-27 ENCOUNTER — Ambulatory Visit: Payer: Medicare HMO | Admitting: Physical Therapy

## 2021-11-27 ENCOUNTER — Encounter: Payer: Self-pay | Admitting: Physical Therapy

## 2021-11-27 DIAGNOSIS — G8929 Other chronic pain: Secondary | ICD-10-CM | POA: Diagnosis not present

## 2021-11-27 DIAGNOSIS — M25511 Pain in right shoulder: Secondary | ICD-10-CM | POA: Diagnosis not present

## 2021-11-27 DIAGNOSIS — M6281 Muscle weakness (generalized): Secondary | ICD-10-CM

## 2021-11-27 DIAGNOSIS — M542 Cervicalgia: Secondary | ICD-10-CM | POA: Diagnosis not present

## 2021-11-27 DIAGNOSIS — M25512 Pain in left shoulder: Secondary | ICD-10-CM | POA: Diagnosis not present

## 2021-11-27 NOTE — Therapy (Addendum)
?OUTPATIENT PHYSICAL THERAPY TREATMENT NOTE ? ? ?Patient Name: Jesus Martin ?MRN: [id number] ?DOB:01/08/1961, 61 y.o., male ?Today's Date: 11/27/2021 ? ?PCP: Christiana Fuchs, DO ?REFERRING PROVIDER: Axel Filler,* ? ? PT End of Session - 11/27/21 1106   ? ? Visit Number 10   ? Number of Visits 16   ? Date for PT Re-Evaluation 12/04/21   ? Authorization Type [employer]   ? Authorization - Visit Number 10   ? Authorization - Number of Visits 12   ? PT Start Time 1102   ? PT Stop Time 1145   ? PT Time Calculation (min) 43 min   ? ?  ?  ? ?  ? ?Progress Note ?Reporting Period 10/09/21 to 11/27/21 ? ?See note below for Objective Data and Assessment of Progress/Goals.  ? ?  ? ? ? ? ?Past Medical History:  ?Diagnosis Date  ? Beta-hemolytic group A streptococcal sepsis ([place])   ? Bipolar I disorder ([place]) 1991  ? Dr. Toy Care  ? Borderline hyperlipidemia   ? history of  ? Childhood asthma   ? COPD (chronic obstructive pulmonary disease) ([place])   ? Diabetes mellitus, new onset ([place]) 12/22/2017  ? Elevated glucose   ? Fibromyalgia   ? GERD (gastroesophageal reflux disease)   ? HTN (hypertension)   ? Low vitamin B12 level 06/07/2018  ? Lower back pain   ? Lumbar vertebral fracture ([place]) 01/29/2019  ? L1  ? Paranoia ([place])   ? Tobacco abuse   ? Tobacco abuse   ? Vitamin D deficiency   ? ?Past Surgical History:  ?Procedure Laterality Date  ? APPENDECTOMY    ? 1975  ? BACK SURGERY    ? 1998  ? NOSE SURGERY    ? ?Patient Active Problem List  ? Diagnosis Date Noted  ? Cervical spondylosis 02/27/2021  ? Rash 07/10/2020  ? Subclinical hyperthyroidism 04/25/2020  ? Tobacco use 04/16/2020  ? Multiple pulmonary nodules 04/16/2020  ? Fatigue 04/16/2020  ? Chest pain 12/12/2019  ? Compression fracture of spine ([place]) 01/30/2019  ? Hyperlipidemia 09/26/2018  ? Foot pain, bilateral 09/26/2018  ? Diabetic neuropathy ([place]) 12/22/2017  ? Chronic pain 11/24/2017  ? Hepatic steatosis 11/24/2017  ? COPD (chronic obstructive pulmonary disease)  ([place]) 11/24/2017  ? Diabetes ([place])   ? Bipolar 1 disorder ([place]) 04/17/2011  ? Hypertension 04/17/2011  ? ?PCP: Christiana Fuchs, DO ?  ?REFERRING PROVIDER: Axel Filler,* ?  ?REFERRING DIAG: [phone] (ICD-10-CM) - Cervical spondylosis M54.12 (ICD-10-CM) - Cervical radiculopathy  ? ?THERAPY DIAG:  ?Cervicalgia ? ?Chronic left shoulder pain ? ?Chronic right shoulder pain ? ?Muscle weakness (generalized) ? ?PERTINENT HISTORY: cervical spondylosis, arthropathy of cervical facet joint, and axial and myofascial pain ?Lumbar fracture with kyphoplasty (about 3 yrs ago) ?Diabetes, HTN, COPD ?Bipolar disorder ? ?PRECAUTIONS: None ? ?SUBJECTIVE:  ?Pain is a 4/10 upper back and shoulder blades. That is the lowest pain I have had Recently.  ? ?PAIN:  ?Are you having pain? Yes ?NPRS scale: 4/10,  ?Pain location: mid back to periscap  ?Pain orientation: Left, Bilateral, Posterior, and Upper  ?PAIN TYPE: aching and stabbing and pressure ?Pain description: constant and stabbing  ?Aggravating factors: turning his head  ?Relieving factors: limited relief with meds or any ?other measures  ? ? ?OBJECTIVE:  ?  ?DIAGNOSTIC FINDINGS:  ?Cervical spondylosis as outlined.  ? ?Levels of mild spinal canal narrowing. Most notably at C4-C5, a  ?small central disc protrusion contributes to mild spinal canal  ?narrowing,  and may contact the ventral spinal cord.  ? ?Multilevel foraminal stenosis, as detailed and greatest on the right  ?at C2-C3 (moderate), on the right at C4-C5 (moderate/severe),  ?bilaterally at C5-C6 (severe right, moderate/severe left) and  ?bilaterally at C6-C7 (severe right, moderate left).  ? ?Disc degeneration is greatest at C5-C6 (moderate) and C6-C7  ?(moderate to moderately advanced). Associated mild degenerative  ?endplate edema at these levels.  ? ?Straightening of the expected cervical lordosis.   ?  ?PATIENT SURVEYS:  ?FOTO 42% status: 50%  11/25/21 ?  ?  ?COGNITION: ?         Overall cognitive status: Within  functional limits for tasks assessed              ?          ?SENSATION: ?         Light touch: Appears intact ?         Stereognosis: Appears intact ?         Hot/Cold: Appears intact ?         Proprioception: Appears intact ?  ?POSTURE:  ?Forward head, scapula abducted from midline and GH joint internal rotation ?  ?PALPATION: ?Pain along bilateral occipitals and extending laterally to upper traps ?  ?CERVICAL AROM/PROM ?  ?A/PROM A/PROM (deg) ?09/23/2021 AROM ?10/28/21 11/25/21 ?AROM  ?Flexion 25 30   ?Extension 30 10   ?Right lateral flexion 20 32   ?Left lateral flexion 20 32   ?Right rotation 40  50  ?Left rotation 40  60  ? (Blank rows = not tested) ?  ?UE AROM/PROM: ?  ?A/PROM Right ?09/23/2021 Left ?09/23/2021 10/28/21 10/28/21 11/27/21 11/27/21  ?Shoulder flexion AROM 110 approx.  AROM 110 approx.  145 145    ?Shoulder extension          ?Shoulder abduction 100 approx.  100 approx    125 125  ?Shoulder adduction          ?Shoulder extension          ?Shoulder internal rotation pain Pain       ?Shoulder external rotation pain Pain       ? (Blank rows = not tested) ?  ?UE MMT: ?  ?MMT Right ?09/23/2021 Left ?09/23/2021 Right ?11/27/21 Left ?11/27/21  ?Shoulder flexion 5/5 5/5    ?Shoulder extension        ?Shoulder abduction 4+/5 4+/5 4+/5 4+/5  ?Shoulder adduction        ?Shoulder extension        ?Shoulder internal rotation     5/5 5/5  ?Shoulder external rotation     4+/5 4+/5  ?Middle trapezius        ?Lower trapezius        ?Elbow flexion 5/5 5/5    ?Elbow extension 4/5 4/5 5/5 4+/5  ?Wrist flexion        ?Wrist extension        ?Wrist ulnar deviation        ?Wrist radial deviation        ?Wrist pronation        ?Wrist supination        ?Grip strength 46 lb due to arthritis  75 lbs    ? (Blank rows = not tested) ?  ?CERVICAL SPECIAL TESTS:  ?NT    ?  ?LUMBAR SPECIAL TESTS:  ?NT ?  ?FUNCTIONAL TESTS:  ?NT ?  ?GAIT: ?Distance walked: 150 ?Assistive device utilized: None ?Level of assistance: Modified  independence ?Comments: not assessed ? ?[facility] Adult PT Treatment:                                                DATE: 11/27/21 ?Therapeutic Exercise: ?NuStep L6 UE and LE for 6 min  ?Scap rows and ext Black band x 20 each ?Standing horizontal abduction with anchored resistance red band  ?Standing blue band shoulder ER x 20  ?Cabinet reaching alternating 3#  ?Standing reach throughs for trunk rotation ?Standing Protract/retract- hands on wall x 10  ?Wall push up PLUS x10 ?Supine blue band diagonals x 15 each ?LTR x 10  ? ? ?[facility] Adult PT Treatment:                                                DATE: 11/25/21 ?Therapeutic Exercise: ?NuStep L6 UE and LE for 6 min  ?Scap rows and ext Black band x 20 each ?Standing horizontal abduction with anchored resistance red band  ?Standing blue band shoulder ER x 20  ?Pec stretch in doorway  ?Supine green band diagonals x 15 each ?3# protraction bilateral supine x 15  ?Standing Protract/retract- hands on wall x 10  ?LTR x 10  ?Supine Bridge x 10 ? ?[facility] Adult PT Treatment:                                                DATE: 11/20/21 ?Therapeutic Exercise: ?NuStep L6 UE and LE for 5 min  ?Scap rows and ext blue x 20 each ?Lower trap activation with Ys facing wall ?Pec stretch in doorway  ?Supine horizontal abduction green x 20  ?Supine bilat shoulder ER x 20  ?Supine green band diagonals x 15 each ?3# protraction bilateral supine x 15  ?LTR x 10  ?Supine Bridge x 10 ? ? ?[facility] Adult PT Treatment:                                                DATE: 11/05/21 ?Therapeutic Exercise: ?NuStep L6 UE and LE for 5 min  ?Scap rows and ext green x 20 each ?Lower trap activation with Ys facing wall ?Pec stretch in doorway  ?Scaption bilateral 10 x 2  ?Pulleys for forward flexion -cues for  ?Supine Bridge x 10 ?Bridge with dowel pullover- cues for breathing.  ? ? ?[facility] Adult PT Treatment:                                                DATE: 10/31/21 ?Therapeutic Exercise: ?Supine cervical  retraction x10 5" ?Sitting cervical retractions x10 5" ?Sitting rotation 10x 5" c over pressure L and R  ?Shoulder hor. Abd 2x10 GTB ?Shoulder ER 2x10 L and R GTB ? ?Manual Therapy: ?STM and DTM to the cervical paraspinals, upp

## 2021-12-01 ENCOUNTER — Encounter: Payer: Self-pay | Admitting: Physical Therapy

## 2021-12-01 ENCOUNTER — Ambulatory Visit: Payer: Medicare HMO | Admitting: Physical Therapy

## 2021-12-01 ENCOUNTER — Other Ambulatory Visit: Payer: Self-pay | Admitting: Internal Medicine

## 2021-12-01 DIAGNOSIS — M25511 Pain in right shoulder: Secondary | ICD-10-CM | POA: Diagnosis not present

## 2021-12-01 DIAGNOSIS — G8929 Other chronic pain: Secondary | ICD-10-CM

## 2021-12-01 DIAGNOSIS — M542 Cervicalgia: Secondary | ICD-10-CM | POA: Diagnosis not present

## 2021-12-01 DIAGNOSIS — M6281 Muscle weakness (generalized): Secondary | ICD-10-CM | POA: Diagnosis not present

## 2021-12-01 DIAGNOSIS — M25512 Pain in left shoulder: Secondary | ICD-10-CM | POA: Diagnosis not present

## 2021-12-01 NOTE — Therapy (Addendum)
OUTPATIENT PHYSICAL THERAPY TREATMENT NOTE DISCHARGE   Patient Name: Jesus Martin MRN: [id number] DOB:11/27/1960, 61 y.o., male Today's Date: 12/01/2021  PCP: Christiana Fuchs, DO REFERRING PROVIDER: Christiana Fuchs, DO   PT End of Session - 12/01/21 0935     Visit Number 11    Number of Visits 16    Date for PT Re-Evaluation 12/04/21    Authorization Type [employer]    Authorization - Visit Number 11    Authorization - Number of Visits 12    PT Start Time 0933    PT Stop Time 6283    PT Time Calculation (min) 39 min                    Past Medical History:  Diagnosis Date   Beta-hemolytic group A streptococcal sepsis ([place])    Bipolar I disorder ([place]) 1991   Dr. [facility]   Borderline hyperlipidemia    history of   Childhood asthma    COPD (chronic obstructive pulmonary disease) ([place])    Diabetes mellitus, new onset ([place]) 12/22/2017   Elevated glucose    Fibromyalgia    GERD (gastroesophageal reflux disease)    HTN (hypertension)    Low vitamin B12 level 06/07/2018   Lower back pain    Lumbar vertebral fracture ([place]) 01/29/2019   L1   Paranoia ([place])    Tobacco abuse    Tobacco abuse    Vitamin D deficiency    Past Surgical History:  Procedure Laterality Date   S[place]er   NOSE SURGERY     Patient Active Problem List   Diagnosis Date Noted   Cervical spondylosis 02/27/2021   Rash 07/10/2020   Subclinical hyperthyroidism 04/25/2020   Tobacco use 04/16/2020   Multiple pulmonary nodules 04/16/2020   Fatigue 04/16/2020   Chest pain 12/12/2019   Compression fracture of spine ([place]) 01/30/2019   Hyperlipidemia 09/26/2018   Foot pain, bilateral 09/26/2018   Diabetic neuropathy ([place]) 12/22/2017   Chronic pain 11/24/2017   Hepatic steatosis 11/24/2017   COPD (chronic obstructive pulmonary disease) ([place]) 11/24/2017   Diabetes ([place])    Bipolar 1 disorder ([place]) 04/17/2011   Hypertension 04/17/2011   PCP:  Christiana Fuchs, DO   REFERRING PROVIDER: Axel Filler   REFERRING DIAG: [phone] (ICD-10-CM) - Cervical spondylosis M54.12 (ICD-10-CM) - Cervical radiculopathy   THERAPY DIAG:  Cervicalgia  Chronic left shoulder pain  Chronic right shoulder pain  Muscle weakness (generalized)  PERTINENT HISTORY: cervical spondylosis, arthropathy of cervical facet joint, and axial and myofascial pain Lumbar fracture with kyphoplasty (about 3 yrs ago) Diabetes, HTN, COPD Bipolar disorder  PRECAUTIONS: None  SUBJECTIVE:  Pain is a 4/10 upper back and shoulder blades. I am doing pretty good. I was sore over the weekend.   PAIN:  Are you having pain? Yes NPRS scale: 4/10,  Pain location: mid back to periscap  Pain orientation: Left, Bilateral, Posterior, and Upper  PAIN TYPE: aching and stabbing and pressure Pain description: constant and stabbing  Aggravating factors: turning his head  Relieving factors: limited relief with meds or any other measures    OBJECTIVE:    DIAGNOSTIC FINDINGS:  Cervical spondylosis as outlined.   Levels of mild spinal canal narrowing. Most notably at C4-C5, a  small central disc protrusion contributes to mild spinal canal  narrowing, and may contact the ventral spinal cord.   Multilevel foraminal stenosis, as detailed and  greatest on the right  at C2-C3 (moderate), on the right at C4-C5 (moderate/severe),  bilaterally at C5-C6 (severe right, moderate/severe left) and  bilaterally at C6-C7 (severe right, moderate left).   Disc degeneration is greatest at C5-C6 (moderate) and C6-C7  (moderate to moderately advanced). Associated mild degenerative  endplate edema at these levels.   Straightening of the expected cervical lordosis.     PATIENT SURVEYS:  FOTO 42% status: 50%  11/25/21     COGNITION:          Overall cognitive status: Within functional limits for tasks assessed                        SENSATION:          Light touch: Appears  intact          Stereognosis: Appears intact          Hot/Cold: Appears intact          Proprioception: Appears intact   POSTURE:  Forward head, scapula abducted from midline and GH joint internal rotation   PALPATION: Pain along bilateral occipitals and extending laterally to upper traps   CERVICAL AROM/PROM   A/PROM A/PROM (deg) 09/23/2021 AROM 10/28/21 11/25/21 AROM  Flexion 25 30   Extension 30 10   Right lateral flexion 20 32   Left lateral flexion 20 32   Right rotation 40  50  Left rotation 40  60   (Blank rows = not tested)   UE AROM/PROM:   A/PROM Right 09/23/2021 Left 09/23/2021 10/28/21 10/28/21 11/27/21 11/27/21  Shoulder flexion AROM 110 approx.  AROM 110 approx.  145 145    Shoulder extension          Shoulder abduction 100 approx.  100 approx    125 125  Shoulder adduction          Shoulder extension          Shoulder internal rotation pain Pain       Shoulder external rotation pain Pain        (Blank rows = not tested)   UE MMT:   MMT Right 09/23/2021 Left 09/23/2021 Right 11/27/21 Left 11/27/21  Shoulder flexion 5/5 5/5    Shoulder extension        Shoulder abduction 4+/5 4+/5 4+/5 4+/5  Shoulder adduction        Shoulder extension        Shoulder internal rotation     5/5 5/5  Shoulder external rotation     4+/5 4+/5  Middle trapezius        Lower trapezius        Elbow flexion 5/5 5/5    Elbow extension 4/5 4/5 5/5 4+/5  Wrist flexion        Wrist extension        Wrist ulnar deviation        Wrist radial deviation        Wrist pronation        Wrist supination        Grip strength 46 lb due to arthritis  75 lbs     (Blank rows = not tested)   CERVICAL SPECIAL TESTS:  NT      LUMBAR SPECIAL TESTS:  NT   FUNCTIONAL TESTS:  NT   GAIT: Distance walked: 150 Assistive device utilized: None Level of assistance: Modified independence Comments: not assessed  [facility] Adult PT Treatment:  DATE:  12/01/21 Therapeutic Exercise: UBE posterior x 4 min L3 Scap rows and ext Black band x 20 each Standing horizontal abduction with anchored resistance blue band x 20 Standing blue band shoulder ER x 20  Cabinet reaching alternating 4# x20 Standing reach throughs for trunk rotation-leaning on counter top Incline Protract/retract- hands on counter x 10  Counter  push up PLUS x10  Open books x 10 each  Thoracic extension in chair- hands behind head  [facility] Adult PT Treatment:                                                DATE: 11/27/21 Therapeutic Exercise: NuStep L6 UE and LE for 6 min  Scap rows and ext Black band x 20 each Standing horizontal abduction with anchored resistance red band  Standing blue band shoulder ER x 20  Cabinet reaching alternating 3#  Standing reach throughs for trunk rotation Standing Protract/retract- hands on wall x 10  Wall push up PLUS x10 Supine blue band diagonals x 15 each LTR x 10  Thoracic ext in chair- hands behind head  [facility] Adult PT Treatment:                                                DATE: 11/25/21 Therapeutic Exercise: NuStep L6 UE and LE for 6 min  Scap rows and ext Black band x 20 each Standing horizontal abduction with anchored resistance red band  Standing blue band shoulder ER x 20  Pec stretch in doorway  Supine green band diagonals x 15 each 3# protraction bilateral supine x 15  Standing Protract/retract- hands on wall x 10  LTR x 10  Supine Bridge x 10  [facility] Adult PT Treatment:                                                DATE: 11/20/21 Therapeutic Exercise: NuStep L6 UE and LE for 5 min  Scap rows and ext blue x 20 each Lower trap activation with Ys facing wall Pec stretch in doorway  Supine horizontal abduction green x 20  Supine bilat shoulder ER x 20  Supine green band diagonals x 15 each 3# protraction bilateral supine x 15  LTR x 10  Supine Bridge x 10          PATIENT EDUCATION:  Education details:  PT/POC, HEP posture and Dry needling  Person educated: Patient and Spouse Education method: Explanation, Demonstration, and Handouts Education comprehension: verbalized understanding, verbal cues required, and needs further education     HOME EXERCISE PROGRAM:  Access Code: [email or web address] URL: [email or web address] Date: 10/28/2021 Prepared by: Raeford Razor  Exercises Supine Cervical Retraction with Towel - 2-3 x daily - 7 x weekly - 2 sets - 10 reps - 5 hold Seated Scapular Retraction - 2-3 x daily - 7 x weekly - 2 sets - 10 reps - 5 hold Corner Pec Major Stretch - 2 x daily - 7 x weekly - 1 sets - 5 reps - 30 hold Seated Upper Trapezius Stretch - 1 x daily - 7 x weekly -  1 sets - 3 reps - 15 hold Seated Levator Scapulae Stretch - 1 x daily - 7 x weekly - 1 sets - 3 reps - 15 hold Supine Lower Trunk Rotation - 2 x daily - 7 x weekly - 2 sets - 10 reps - 10 hold Supine Shoulder Horizontal Abduction with Resistance - 2 x daily - 7 x weekly - 2 sets - 10 reps - 5 hold Supine Shoulder External Rotation with Resistance - 2 x daily - 7 x weekly - 2 sets - 10 reps - 5 hold       ASSESSMENT:   CLINICAL IMPRESSION: Pt reports continued compliance with HEP and rates his pain lower today at 4/10. He reports soreness over the weekend from over doing his HEP. Pain located along thoracic spine. He reported no increased pain after last session which began over head lifting and wall pushup.  Continued with core/scap strength and trunk stretching. Continued closed chain UE and overhead lifting with good tolerance.       OBJECTIVE IMPAIRMENTS decreased activity tolerance, decreased endurance, decreased mobility, difficulty walking, decreased ROM, decreased strength, hypomobility, increased fascial restrictions, impaired flexibility, impaired UE functional use, improper body mechanics, postural dysfunction, obesity, and pain.    ACTIVITY LIMITATIONS cleaning, community activity, driving,  yard work, and yard work.    PERSONAL FACTORS Past/current experiences, Time since onset of injury/illness/exacerbation, and 3+ comorbidities: diabetes, bipolar and previous lumbar pain   are also affecting patient's functional outcome.      REHAB POTENTIAL: Good   CLINICAL DECISION MAKING: Evolving/moderate complexity   EVALUATION COMPLEXITY: Moderate     GOALS: Goals reviewed with patient? Yes   SHORT TERM GOALS:   STG Name Target Date Goal status  1 Pt will be I with HEP for cervical AROM, posture and UE strength  Baseline: up to date  11/06/2021 met  2 Pt will experience less pain at rest, typically 3/10 at rest with head support Baseline: 4-5/10  Status: 4/10 at the least 11/06/2021 Ongoing  3 Pt will be able to lift arm(s) to above shoulder height without increased neck pain  Baseline: improved ROM with upper trap pain  Status 11/27/21: no increase pain with shoulder flexion AROM 11/06/2021 met  4 Pt will understand FOTO and potential for positive outcome  Baseline: 42% Status : educated on FOTO  11/06/2021 met                                LONG TERM GOALS:    LTG Name Target Date Goal status  1 Pt will be able to show cervical AROM improved by 10-15 deg in each direction for general ease of movement and interaction with environment Baseline: improving  Status: 11/25/21 rotation improved 10-20 degrees  12/04/2021 Partially Met 11/25/21  2 Pt will be able to demo 5/5 strength in UE for maximal neck support  Baseline: 4+/5 Status: 11/27/21: 4+/5-5/5 bilat UE 12/04/2021 Partially met 11/27/21  3 Pt will be able to improve sleep quality and bed mobility due to less pain,  50% better or more  Baseline extremely difficult, wakes due to pain frequently Status: 11/27/21: Somewhat improved  25% 12/04/2021 Partial met   4 Pt will be able to score 57% or better on FOTO Baseline: 42% Status: imrpved to 50% 11/25/21 12/04/2021 Partial met    5 Pt will be able to use arms for overhead  reaching without increasing neck pain for  most household tasks.  Baseline: pain just above shoulder height Status: able to reach overhead with less pain 11/25/21-mid back 12/04/2021 Partial met                       PLAN: PT FREQUENCY: 2x/week   PT DURATION: 8 weeks   PLANNED INTERVENTIONS: Therapeutic exercises, Therapeutic activity, Neuromuscular re-education, Balance training, Gait training, Patient/Family education, Joint mobilization, Dry Needling, Electrical stimulation, Cryotherapy, Moist heat, and Manual therapy   PLAN FOR NEXT SESSION:  CHECK GOALS Continue posture and upper back strengthening with light to med bands, mhp? TPDN-periscap, thoracic extension and rotation as tolerated. Include some low back , hip for general .    Hessie Diener, PTA 12/01/21 10:05 AM Phone: [phone] Fax: [phone]   PHYSICAL THERAPY DISCHARGE SUMMARY  Visits from Start of Care: 11  Current functional level related to goals / functional outcomes: See above, unknown    Remaining deficits: See above, unknown    Education / Equipment: HEP, posture    Patient agrees to discharge. Patient goals were partially met. Patient is being discharged due to not returning since the last visit.  Raeford Razor, PT 02/11/22 8:28 AM Phone: [phone] Fax: [phone]

## 2021-12-02 ENCOUNTER — Other Ambulatory Visit: Payer: Self-pay | Admitting: Internal Medicine

## 2021-12-02 DIAGNOSIS — E119 Type 2 diabetes mellitus without complications: Secondary | ICD-10-CM

## 2021-12-03 ENCOUNTER — Telehealth: Payer: Self-pay | Admitting: Physical Therapy

## 2021-12-03 ENCOUNTER — Ambulatory Visit: Payer: Medicare HMO | Admitting: Physical Therapy

## 2021-12-03 NOTE — Telephone Encounter (Signed)
Called patient regarding his missed appt this AM.  Was unable to leave a voicemail to reschedule.   ?Raeford Razor, PT ?12/03/21 9:57 AM ?Phone: [phone] ?Fax: [phone]  ?

## 2021-12-03 NOTE — Therapy (Deleted)
OUTPATIENT PHYSICAL THERAPY TREATMENT NOTE   Patient Name: Jesus Martin MRN: [id number] DOB:[birth year], 61 y.o., male Today's Date: 12/03/2021  PCP: Christiana Fuchs, DO REFERRING PROVIDER: Axel Filler            Past Medical History:  Diagnosis Date   Beta-hemolytic group A streptococcal sepsis ([place])    Bipolar I disorder ([place]) 1991   Dr. Toy Care   Borderline hyperlipidemia    history of   Childhood asthma    COPD (chronic obstructive pulmonary disease) ([place])    Diabetes mellitus, new onset ([place]) 12/22/2017   Elevated glucose    Fibromyalgia    GERD (gastroesophageal reflux disease)    HTN (hypertension)    Low vitamin B12 level 06/07/2018   Lower back pain    Lumbar vertebral fracture ([place]) 01/29/2019   L1   Paranoia ([place])    Tobacco abuse    Tobacco abuse    Vitamin D deficiency    Past Surgical History:  Procedure Laterality Date   [place]   NOSE SURGERY     Patient Active Problem List   Diagnosis Date Noted   Cervical spondylosis 02/27/2021   Rash 07/10/2020   Subclinical hyperthyroidism 04/25/2020   Tobacco use 04/16/2020   Multiple pulmonary nodules 04/16/2020   Fatigue 04/16/2020   Chest pain 12/12/2019   Compression fracture of spine ([place]) 01/30/2019   Hyperlipidemia 09/26/2018   Foot pain, bilateral 09/26/2018   Diabetic neuropathy ([place]) 12/22/2017   Chronic pain 11/24/2017   Hepatic steatosis 11/24/2017   COPD (chronic obstructive pulmonary disease) ([place]) 11/24/2017   Diabetes ([place])    Bipolar 1 disorder ([place]) 04/17/2011   Hypertension 04/17/2011   PCP: Christiana Fuchs, DO   REFERRING PROVIDER: Axel Filler   REFERRING DIAG: [phone] (ICD-10-CM) - Cervical spondylosis M54.12 (ICD-10-CM) - Cervical radiculopathy   THERAPY DIAG:  No diagnosis found.  PERTINENT HISTORY: cervical spondylosis, arthropathy of cervical facet joint, and axial and myofascial pain Lumbar  fracture with kyphoplasty (about 3 yrs ago) Diabetes, HTN, COPD Bipolar disorder  PRECAUTIONS: None  SUBJECTIVE:  Pain is a 4/10 upper back and shoulder blades. I am doing pretty good. I was sore over the weekend.   PAIN:  Are you having pain? Yes NPRS scale: 4/10,  Pain location: mid back to periscap  Pain orientation: Left, Bilateral, Posterior, and Upper  PAIN TYPE: aching and stabbing and pressure Pain description: constant and stabbing  Aggravating factors: turning his head  Relieving factors: limited relief with meds or any other measures    OBJECTIVE:    DIAGNOSTIC FINDINGS:  Cervical spondylosis as outlined.   Levels of mild spinal canal narrowing. Most notably at C4-C5, a  small central disc protrusion contributes to mild spinal canal  narrowing, and may contact the ventral spinal cord.   Multilevel foraminal stenosis, as detailed and greatest on the right  at C2-C3 (moderate), on the right at C4-C5 (moderate/severe),  bilaterally at C5-C6 (severe right, moderate/severe left) and  bilaterally at C6-C7 (severe right, moderate left).   Disc degeneration is greatest at C5-C6 (moderate) and C6-C7  (moderate to moderately advanced). Associated mild degenerative  endplate edema at these levels.   Straightening of the expected cervical lordosis.     PATIENT SURVEYS:  FOTO 42% status: 50%  11/25/21     COGNITION:          Overall cognitive status: Within functional limits for tasks  assessed                        SENSATION:          Light touch: Appears intact          Stereognosis: Appears intact          Hot/Cold: Appears intact          Proprioception: Appears intact   POSTURE:  Forward head, scapula abducted from midline and GH joint internal rotation   PALPATION: Pain along bilateral occipitals and extending laterally to upper traps   CERVICAL AROM/PROM   A/PROM A/PROM (deg) 09/23/2021 AROM 10/28/21 11/25/21 AROM  Flexion 25 30   Extension 30 10    Right lateral flexion 20 32   Left lateral flexion 20 32   Right rotation 40  50  Left rotation 40  60   (Blank rows = not tested)   UE AROM/PROM:   A/PROM Right 09/23/2021 Left 09/23/2021 10/28/21 10/28/21 11/27/21 11/27/21  Shoulder flexion AROM 110 approx.  AROM 110 approx.  145 145    Shoulder extension          Shoulder abduction 100 approx.  100 approx    125 125  Shoulder adduction          Shoulder extension          Shoulder internal rotation pain Pain       Shoulder external rotation pain Pain        (Blank rows = not tested)   UE MMT:   MMT Right 09/23/2021 Left 09/23/2021 Right 11/27/21 Left 11/27/21  Shoulder flexion 5/5 5/5    Shoulder extension        Shoulder abduction 4+/5 4+/5 4+/5 4+/5  Shoulder adduction        Shoulder extension        Shoulder internal rotation     5/5 5/5  Shoulder external rotation     4+/5 4+/5  Middle trapezius        Lower trapezius        Elbow flexion 5/5 5/5    Elbow extension 4/5 4/5 5/5 4+/5  Wrist flexion        Wrist extension        Wrist ulnar deviation        Wrist radial deviation        Wrist pronation        Wrist supination        Grip strength 46 lb due to arthritis  75 lbs     (Blank rows = not tested)   CERVICAL SPECIAL TESTS:  NT      LUMBAR SPECIAL TESTS:  NT   FUNCTIONAL TESTS:  NT   GAIT: Distance walked: 150 Assistive device utilized: None Level of assistance: Modified independence Comments: not assessed  [facility] Adult PT Treatment:                                                DATE: 12/01/21 Therapeutic Exercise: UBE posterior x 4 min L3 Scap rows and ext Black band x 20 each Standing horizontal abduction with anchored resistance blue band x 20 Standing blue band shoulder ER x 20  Cabinet reaching alternating 4# x20 Standing reach throughs for trunk rotation-leaning on counter top Incline Protract/retract- hands on counter x 10  Counter  push up PLUS x10  Open books x 10 each  Thoracic  extension in chair- hands behind head  [facility] Adult PT Treatment:                                                DATE: 11/27/21 Therapeutic Exercise: NuStep L6 UE and LE for 6 min  Scap rows and ext Black band x 20 each Standing horizontal abduction with anchored resistance red band  Standing blue band shoulder ER x 20  Cabinet reaching alternating 3#  Standing reach throughs for trunk rotation Standing Protract/retract- hands on wall x 10  Wall push up PLUS x10 Supine blue band diagonals x 15 each LTR x 10  Thoracic ext in chair- hands behind head  [facility] Adult PT Treatment:                                                DATE: 11/25/21 Therapeutic Exercise: NuStep L6 UE and LE for 6 min  Scap rows and ext Black band x 20 each Standing horizontal abduction with anchored resistance red band  Standing blue band shoulder ER x 20  Pec stretch in doorway  Supine green band diagonals x 15 each 3# protraction bilateral supine x 15  Standing Protract/retract- hands on wall x 10  LTR x 10  Supine Bridge x 10  [facility] Adult PT Treatment:                                                DATE: 11/20/21 Therapeutic Exercise: NuStep L6 UE and LE for 5 min  Scap rows and ext blue x 20 each Lower trap activation with Ys facing wall Pec stretch in doorway  Supine horizontal abduction green x 20  Supine bilat shoulder ER x 20  Supine green band diagonals x 15 each 3# protraction bilateral supine x 15  LTR x 10  Supine Bridge x 10          PATIENT EDUCATION:  Education details: PT/POC, HEP posture and Dry needling  Person educated: Patient and Spouse Education method: Explanation, Demonstration, and Handouts Education comprehension: verbalized understanding, verbal cues required, and needs further education     HOME EXERCISE PROGRAM:  Access Code: [email or web address] URL: [email or web address] Date: 10/28/2021 Prepared by: Raeford Razor  Exercises Supine Cervical Retraction with  Towel - 2-3 x daily - 7 x weekly - 2 sets - 10 reps - 5 hold Seated Scapular Retraction - 2-3 x daily - 7 x weekly - 2 sets - 10 reps - 5 hold Corner Pec Major Stretch - 2 x daily - 7 x weekly - 1 sets - 5 reps - 30 hold Seated Upper Trapezius Stretch - 1 x daily - 7 x weekly - 1 sets - 3 reps - 15 hold Seated Levator Scapulae Stretch - 1 x daily - 7 x weekly - 1 sets - 3 reps - 15 hold Supine Lower Trunk Rotation - 2 x daily - 7 x weekly - 2 sets - 10 reps - 10 hold Supine Shoulder Horizontal Abduction with Resistance - 2  x daily - 7 x weekly - 2 sets - 10 reps - 5 hold Supine Shoulder External Rotation with Resistance - 2 x daily - 7 x weekly - 2 sets - 10 reps - 5 hold       ASSESSMENT:   CLINICAL IMPRESSION: Pt reports continued compliance with HEP and rates his pain lower today at 4/10. He reports soreness over the weekend from over doing his HEP. Pain located along thoracic spine. He reported no increased pain after last session which began over head lifting and wall pushup.  Continued with core/scap strength and trunk stretching. Continued closed chain UE and overhead lifting with good tolerance.       OBJECTIVE IMPAIRMENTS decreased activity tolerance, decreased endurance, decreased mobility, difficulty walking, decreased ROM, decreased strength, hypomobility, increased fascial restrictions, impaired flexibility, impaired UE functional use, improper body mechanics, postural dysfunction, obesity, and pain.    ACTIVITY LIMITATIONS cleaning, community activity, driving, yard work, and yard work.    PERSONAL FACTORS Past/current experiences, Time since onset of injury/illness/exacerbation, and 3+ comorbidities: diabetes, bipolar and previous lumbar pain   are also affecting patient's functional outcome.      REHAB POTENTIAL: Good   CLINICAL DECISION MAKING: Evolving/moderate complexity   EVALUATION COMPLEXITY: Moderate     GOALS: Goals reviewed with patient? Yes   SHORT TERM  GOALS:   STG Name Target Date Goal status  1 Pt will be I with HEP for cervical AROM, posture and UE strength  Baseline: up to date  11/06/2021 met  2 Pt will experience less pain at rest, typically 3/10 at rest with head support Baseline: 4-5/10  Status: 4/10 at the least 11/06/2021 Ongoing  3 Pt will be able to lift arm(s) to above shoulder height without increased neck pain  Baseline: improved ROM with upper trap pain  Status 11/27/21: no increase pain with shoulder flexion AROM 11/06/2021 met  4 Pt will understand FOTO and potential for positive outcome  Baseline: 42% Status : educated on FOTO  11/06/2021 met                                LONG TERM GOALS:    LTG Name Target Date Goal status  1 Pt will be able to show cervical AROM improved by 10-15 deg in each direction for general ease of movement and interaction with environment Baseline: improving  Status: 11/25/21 rotation improved 10-20 degrees  12/04/2021 Partially Met 11/25/21  2 Pt will be able to demo 5/5 strength in UE for maximal neck support  Baseline: 4+/5 Status: 11/27/21: 4+/5-5/5 bilat UE 12/04/2021 Partially met 11/27/21  3 Pt will be able to improve sleep quality and bed mobility due to less pain,  50% better or more  Baseline extremely difficult, wakes due to pain frequently Status: 11/27/21: Somewhat improved  25% 12/04/2021 Ongoing  4 Pt will be able to score 57% or better on FOTO Baseline: 42% Status: imrpved to 50% 11/25/21 12/04/2021 Ongoing   5 Pt will be able to use arms for overhead reaching without increasing neck pain for most household tasks.  Baseline: pain just above shoulder height Status: able to reach overhead with less pain 11/25/21-mid back 12/04/2021 Ongoing                       PLAN: PT FREQUENCY: 2x/week   PT DURATION: 8 weeks   PLANNED INTERVENTIONS: Therapeutic exercises, Therapeutic activity, Neuromuscular  re-education, Balance training, Gait training, Patient/Family education, Joint  mobilization, Dry Needling, Electrical stimulation, Cryotherapy, Moist heat, and Manual therapy   PLAN FOR NEXT SESSION:  CHECK GOALS Continue posture and upper back strengthening with light to med bands, mhp? TPDN-periscap, thoracic extension and rotation as tolerated. Include some low back , hip for general .    Hessie Diener, PTA 12/03/21 8:00 AM Phone: [phone] Fax: [phone]

## 2021-12-07 ENCOUNTER — Other Ambulatory Visit: Payer: Self-pay | Admitting: Internal Medicine

## 2021-12-07 DIAGNOSIS — E119 Type 2 diabetes mellitus without complications: Secondary | ICD-10-CM

## 2021-12-09 ENCOUNTER — Ambulatory Visit (INDEPENDENT_AMBULATORY_CARE_PROVIDER_SITE_OTHER): Payer: Medicare HMO | Admitting: Emergency Medicine

## 2021-12-09 ENCOUNTER — Encounter: Payer: Self-pay | Admitting: Emergency Medicine

## 2021-12-09 ENCOUNTER — Ambulatory Visit: Payer: Medicare HMO | Admitting: Emergency Medicine

## 2021-12-09 DIAGNOSIS — J449 Chronic obstructive pulmonary disease, unspecified: Secondary | ICD-10-CM

## 2021-12-09 DIAGNOSIS — Z720 Tobacco use: Secondary | ICD-10-CM | POA: Diagnosis not present

## 2021-12-09 DIAGNOSIS — J301 Allergic rhinitis due to pollen: Secondary | ICD-10-CM

## 2021-12-09 DIAGNOSIS — R918 Other nonspecific abnormal finding of lung field: Secondary | ICD-10-CM | POA: Diagnosis not present

## 2021-12-09 DIAGNOSIS — J309 Allergic rhinitis, unspecified: Secondary | ICD-10-CM | POA: Insufficient documentation

## 2021-12-09 LAB — PULMONARY FUNCTION TEST
DL/VA % pred: 107 %
DL/VA: 4.51 ml/min/mmHg/L
DLCO cor % pred: 105 %
DLCO cor: 30.91 ml/min/mmHg
DLCO unc % pred: 105 %
DLCO unc: 30.91 ml/min/mmHg
FEF 25-75 Post: 2.68 L/sec
FEF 25-75 Pre: 2.74 L/sec
FEF2575-%Change-Post: -2 %
FEF2575-%Pred-Post: 84 %
FEF2575-%Pred-Pre: 86 %
FEV1-%Change-Post: 0 %
FEV1-%Pred-Post: 74 %
FEV1-%Pred-Pre: 74 %
FEV1-Post: 2.88 L
FEV1-Pre: 2.89 L
FEV1FVC-%Change-Post: -2 %
FEV1FVC-%Pred-Pre: 105 %
FEV6-%Change-Post: 1 %
FEV6-%Pred-Post: 75 %
FEV6-%Pred-Pre: 74 %
FEV6-Post: 3.69 L
FEV6-Pre: 3.63 L
FEV6FVC-%Change-Post: 0 %
FEV6FVC-%Pred-Post: 104 %
FEV6FVC-%Pred-Pre: 104 %
FVC-%Change-Post: 1 %
FVC-%Pred-Post: 72 %
FVC-%Pred-Pre: 70 %
FVC-Post: 3.7 L
FVC-Pre: 3.63 L
Post FEV1/FVC ratio: 78 %
Post FEV6/FVC ratio: 100 %
Pre FEV1/FVC ratio: 80 %
Pre FEV6/FVC Ratio: 100 %
RV % pred: 155 %
RV: 3.66 L
TLC % pred: 106 %
TLC: 7.85 L

## 2021-12-09 NOTE — Patient Instructions (Signed)
?  Attempted full PFT. Completed Spirometry and DLCO.  ?  ? ?

## 2021-12-09 NOTE — Assessment & Plan Note (Signed)
Discussed cessation today.  He knows that he needs to cut down.  Is going to work on this.  Lung cancer screening CT as above. ?

## 2021-12-09 NOTE — Progress Notes (Signed)
? ?Subjective:  ? ? Patient ID: Jesus Martin, male    DOB: 1961/07/06, 61 y.o.   MRN: [id number] ? ?HPI ?61 year old man, active smoker (75 pack years, currently 0.75 pk/day), childhood asthma with COPD.  PMH also significant for diabetes, hypertension, bipolar disorder on lithium, GERD, fibromyalgia, hyperthyroidism. ?He is referred today for his COPD, some progressive symptoms.  Has been managed on Trelegy. ?He reports that he has been experiencing increased exertional SOB over the last 4-5 months - walks his dog 2 miles, has had more SOB w this. Can do some woodworking, handyman tasks but more limited. Has had some increased cough, usually non-productive. Can hear wheeze, often at night when supine. He snores, loud, witnessed apneas - does not want a sleep eval. Has gained wt over the last 4 yrs. Using albuterol - about once a day for dyspnea.  ? ?Lung cancer screening CT in 06/21/2020 reviewed by me shows some mild centrilobular emphysema and diffuse bronchial wall thickening.  A an anterior right lower lobe triangular subpleural pulmonary nodule 5.2 mm.  RADS 2 study. ? ?Pulmonary function testing 03/03/2017 reviewed by me, poor quality study due to patient's understanding of the instructions, shows spirometry consistent with mixed obstruction and restriction.  Flow volume loop with blunting of the inspiratory and expiratory curves could suggest fixed obstruction.  Lung volumes were not completed ? ? ? ?ROV 12/09/21 --follow-up visit 61 year old gentleman, active smoker with a history of COPD and childhood asthma.  Also with GERD.  I saw him for progressive COPD symptoms, exertional dyspnea, cough that usually nonproductive.  He has emphysematous changes on CT chest. ?I continued him on Trelegy, arrange for pulmonary function testing as below.  Also lung cancer screening CT chest as below.  ?Alpha-1 antitrypsin 10/17/2021, MM, 146 ?He still has limitations on his breathing, some decreased functional capacity.  More nasal drainage - clear. More dry cough also.  ? ?Lung cancer screening CT 10/24/2021 reviewed by me showed patchy groundglass nodules posterior medial right lower lobe, left lower lobe, largest 15 mm.  RADS 2 study, recommended follow-up in 1 year ? ?Pulmonary function testing performed today, reviewed by me shows mixed obstruction and restriction with a moderately decreased FEV1 2.89 L (74% predicted), no bronchodilator response.  Lung volumes are hyperinflated.  Diffusion capacity normal. ? ? ?Review of Systems ?As per HPI ? ?Past Medical History:  ?Diagnosis Date  ? Beta-hemolytic group A streptococcal sepsis ([place])   ? Bipolar I disorder ([place]) 1991  ? Dr. Toy Care  ? Borderline hyperlipidemia   ? history of  ? Childhood asthma   ? COPD (chronic obstructive pulmonary disease) ([place])   ? Diabetes mellitus, new onset ([place]) 12/22/2017  ? Elevated glucose   ? Fibromyalgia   ? GERD (gastroesophageal reflux disease)   ? HTN (hypertension)   ? Low vitamin B12 level 06/07/2018  ? Lower back pain   ? Lumbar vertebral fracture ([place]) 01/29/2019  ? L1  ? Paranoia ([place])   ? Tobacco abuse   ? Tobacco abuse   ? Vitamin D deficiency   ?  ? ?Family History  ?Adopted: Yes  ?  ? ?Social History  ? ?Socioeconomic History  ? Marital status: Married  ?  Spouse name: Not on file  ? Number of children: Not on file  ? Years of education: Not on file  ? Highest education level: Not on file  ?Occupational History  ? Not on file  ?Tobacco Use  ? Smoking status: Every Day  ?  Packs/day: 0.75  ?  Years: 45.00  ?  Pack years: 33.75  ?  Types: Cigarettes  ? Smokeless tobacco: Never  ? Tobacco comments:  ?  3/4 pack smoked daily ARJ 12/09/21  ?Vaping Use  ? Vaping Use: Never used  ?Substance and Sexual Activity  ? Alcohol use: Yes  ?  Comment: Beer sometimes.  ? Drug use: No  ? Sexual activity: Not on file  ?Other Topics Concern  ? Not on file  ?Social History Narrative  ? Lives with wife Arshdeep Bolger (married 28 years) and son. Has 4 dogs and  fish. Not employed. Woodworking for fun. HS graduate.   ?   ? Quit smoking in 2008. 26 pack years. No recreational drug use. Drinks 1 glass of wine per month. Does not exercise.   ? ?Social Determinants of Health  ? ?Financial Resource Strain: Not on file  ?Food Insecurity: Not on file  ?Transportation Needs: Not on file  ?Physical Activity: Not on file  ?Stress: Not on file  ?Social Connections: Not on file  ?Intimate Partner Violence: Not on file  ?  ?Woodworking ?Has lived in [place], [place] ?Occupational asbestos exposure.  ?No TB exposure ? ?Allergies  ?Allergen Reactions  ? Yellow Jacket Venom [Bee Venom] Anaphylaxis and Other (See Comments)  ?  And the patient passes out  ? 5-Alpha Reductase Inhibitors   ?  Pt unsure why this is listed  ? Aspirin Rash  ?  ? ?Outpatient Medications Prior to Visit  ?Medication Sig Dispense Refill  ? Accu-Chek FastClix Lancets MISC Check blood sugar 3 times a day. 102 each 11  ? alprazolam (XANAX) 2 MG tablet Take 2 mg by mouth at bedtime as needed for sleep or anxiety.     ? amLODipine (NORVASC) 10 MG tablet Take 1 tablet by mouth once daily 90 tablet 2  ? atorvastatin (LIPITOR) 40 MG tablet Take 1 tablet (40 mg total) by mouth daily at 6 PM. 90 tablet 3  ? BD VEO INSULIN SYRINGE U/F 31G X 15/64" 0.3 ML MISC USE TWICE DAILY TO INJECT INSULIN 100 each 0  ? blood glucose meter kit and supplies KIT Dispense based on patient and insurance preference. Use up to four times daily as directed. (FOR ICD-9 250.00, 250.01). 1 each 0  ? Blood Glucose Monitoring Suppl (ACCU-CHEK GUIDE) w/Device KIT 1 each by Does not apply route 3 (three) times daily. 1 kit 1  ? Capsaicin 0.025 % PADS Apply 1 application topically 3 (three) times daily as needed. 20 each 0  ? capsicum (ZOSTRIX) 0.075 % topical cream Apply 1 application topically 3 (three) times daily as needed. 114 g 0  ? diclofenac sodium (VOLTAREN) 1 % GEL Apply 2 g topically 4 (four) times daily. 350 g 6  ? diphenhydrAMINE (BENADRYL) 25 mg  capsule Take 25 mg by mouth every 8 (eight) hours as needed for allergies.    ? EPINEPHrine 0.3 mg/0.3 mL IJ SOAJ injection Inject 0.3 mg into the muscle once as needed (for anaphylactic reaction to bee stings).    ? glucose blood (ACCU-CHEK GUIDE) test strip Check blood sugar 3 times a day. 100 each 12  ? ipratropium-albuterol (DUONEB) 0.5-2.5 (3) MG/3ML SOLN Take 3 mLs by nebulization every 4 (four) hours as needed. 360 mL 1  ? lamoTRIgine (LAMICTAL) 150 MG tablet Take 150 mg by mouth 2 (two) times daily.    ? lithium carbonate (ESKALITH) 450 MG CR tablet Take 450-675 mg by mouth See admin  instructions. 675 mg by mouth in the morning and 450 mg at bedtime    ? losartan (COZAAR) 100 MG tablet Take 1 tablet (100 mg total) by mouth daily. 30 tablet 11  ? metFORMIN (GLUCOPHAGE) 1000 MG tablet TAKE 1 TABLET BY MOUTH TWICE DAILY WITH A MEAL 180 tablet 2  ? methocarbamol (ROBAXIN) 500 MG tablet Take by mouth.    ? multivitamin (ONE-A-DAY MEN'S) TABS tablet Take 1 tablet by mouth daily.    ? NOVOLOG 100 UNIT/ML injection Inject 15 Units into the skin 2 (two) times daily with a meal. 15 mL 0  ? omeprazole (PRILOSEC) 40 MG capsule Take 1 capsule (40 mg total) by mouth daily. 90 capsule 0  ? PFIZER COVID-19 VAC BIVALENT injection     ? pregabalin (LYRICA) 150 MG capsule Take 1 capsule by mouth twice daily 60 capsule 0  ? QUEtiapine (SEROQUEL) 50 MG tablet Take 225 mg by mouth at bedtime.    ? traMADol (ULTRAM) 50 MG tablet Take 1 tablet by mouth twice daily as needed 60 tablet 0  ? TRELEGY ELLIPTA 100-62.5-25 MCG/ACT AEPB INHALE 1 PUFF ONCE DAILY 60 each 3  ? VENTOLIN HFA 108 (90 Base) MCG/ACT inhaler INHALE 2 PUFFS BY MOUTH EVERY 6 HOURS AS NEEDED FOR WHEEZING OR SHORTNESS OF BREATH 18 g 3  ? ?No facility-administered medications prior to visit.  ? ? ? ?   ?Objective:  ? Physical Exam ?Vitals:  ? 12/09/21 1114  ?BP: 136/78  ?Pulse: 92  ?Temp: (!) 97.5 ?F (36.4 ?C)  ?TempSrc: Oral  ?SpO2: 94%  ?Weight: 265 lb 12.8 oz (120.6  kg)  ?Height: 6' (1.829 m)  ? ? ?Gen: [place], obese man, in no distress,  normal affect ? ?ENT: No lesions,  mouth clear,  oropharynx clear, no postnasal drip ? ?Neck: No JVD, no stridor ? ?Lungs: No use of a

## 2021-12-09 NOTE — Assessment & Plan Note (Signed)
We reviewed your pulmonary function testing today. ?Please continue Trelegy 1 once daily.  Rinse and gargle after using. ?Keep albuterol available to use 2 puffs up to every 4 hours if needed for shortness of breath, chest tightness, wheezing.  ?Follow with Dr. Lamonte Sakai in 12 months or sooner if you have any problems.  ?

## 2021-12-09 NOTE — Assessment & Plan Note (Signed)
Has been more active with associated increased cough.  He is going to restart his Zyrtec release during the allergy season.  Agree with this plan. ?

## 2021-12-09 NOTE — Patient Instructions (Addendum)
We reviewed your pulmonary function testing today. ?Please continue Trelegy 1 once daily.  Rinse and gargle after using. ?Keep albuterol available to use 2 puffs up to every 4 hours if needed for shortness of breath, chest tightness, wheezing.  ?Work hard to continue to decrease your cigarettes.  Ultimate goal will be to stop altogether. ?We reviewed your lung cancer screening CT scan.  This scan was reassuring.  You need a repeat scan in 1 year (April 2024) ?Restart your Zyrtec once daily during the spring and fall allergy seasons. ?Follow with Dr. Lamonte Sakai in 12 months or sooner if you have any problems.  ? ?

## 2021-12-09 NOTE — Progress Notes (Signed)
Attempted full PFT. Completed Spirometry and DLCO. ?

## 2021-12-09 NOTE — Assessment & Plan Note (Signed)
Following now with lung cancer screening CT. current scan reassuring, RADS 2.  Plan to repeat in 1 year. ?

## 2021-12-16 DIAGNOSIS — R0601 Orthopnea: Secondary | ICD-10-CM | POA: Diagnosis not present

## 2021-12-16 DIAGNOSIS — H25013 Cortical age-related cataract, bilateral: Secondary | ICD-10-CM | POA: Diagnosis not present

## 2021-12-16 DIAGNOSIS — Z91030 Bee allergy status: Secondary | ICD-10-CM | POA: Diagnosis not present

## 2021-12-16 DIAGNOSIS — E1136 Type 2 diabetes mellitus with diabetic cataract: Secondary | ICD-10-CM | POA: Diagnosis not present

## 2021-12-16 DIAGNOSIS — K219 Gastro-esophageal reflux disease without esophagitis: Secondary | ICD-10-CM | POA: Diagnosis not present

## 2021-12-16 DIAGNOSIS — E669 Obesity, unspecified: Secondary | ICD-10-CM | POA: Diagnosis not present

## 2021-12-16 DIAGNOSIS — H2511 Age-related nuclear cataract, right eye: Secondary | ICD-10-CM | POA: Diagnosis not present

## 2021-12-16 DIAGNOSIS — I10 Essential (primary) hypertension: Secondary | ICD-10-CM | POA: Diagnosis not present

## 2021-12-16 DIAGNOSIS — Z6834 Body mass index (BMI) 34.0-34.9, adult: Secondary | ICD-10-CM | POA: Diagnosis not present

## 2021-12-16 DIAGNOSIS — Z79899 Other long term (current) drug therapy: Secondary | ICD-10-CM | POA: Diagnosis not present

## 2021-12-16 DIAGNOSIS — F17210 Nicotine dependence, cigarettes, uncomplicated: Secondary | ICD-10-CM | POA: Diagnosis not present

## 2021-12-16 DIAGNOSIS — J449 Chronic obstructive pulmonary disease, unspecified: Secondary | ICD-10-CM | POA: Diagnosis not present

## 2021-12-17 DIAGNOSIS — H2512 Age-related nuclear cataract, left eye: Secondary | ICD-10-CM | POA: Diagnosis not present

## 2021-12-23 ENCOUNTER — Encounter: Payer: Self-pay | Admitting: Dietician

## 2021-12-29 ENCOUNTER — Other Ambulatory Visit: Payer: Self-pay | Admitting: Student

## 2021-12-29 ENCOUNTER — Other Ambulatory Visit: Payer: Self-pay | Admitting: Internal Medicine

## 2021-12-29 DIAGNOSIS — K219 Gastro-esophageal reflux disease without esophagitis: Secondary | ICD-10-CM

## 2021-12-29 DIAGNOSIS — G8929 Other chronic pain: Secondary | ICD-10-CM

## 2021-12-30 DIAGNOSIS — F17200 Nicotine dependence, unspecified, uncomplicated: Secondary | ICD-10-CM | POA: Diagnosis not present

## 2021-12-30 DIAGNOSIS — H2512 Age-related nuclear cataract, left eye: Secondary | ICD-10-CM | POA: Diagnosis not present

## 2021-12-30 DIAGNOSIS — E119 Type 2 diabetes mellitus without complications: Secondary | ICD-10-CM | POA: Diagnosis not present

## 2021-12-30 DIAGNOSIS — K219 Gastro-esophageal reflux disease without esophagitis: Secondary | ICD-10-CM | POA: Diagnosis not present

## 2021-12-30 DIAGNOSIS — Z79899 Other long term (current) drug therapy: Secondary | ICD-10-CM | POA: Diagnosis not present

## 2021-12-30 DIAGNOSIS — E669 Obesity, unspecified: Secondary | ICD-10-CM | POA: Diagnosis not present

## 2021-12-30 DIAGNOSIS — H25042 Posterior subcapsular polar age-related cataract, left eye: Secondary | ICD-10-CM | POA: Diagnosis not present

## 2021-12-30 DIAGNOSIS — E1136 Type 2 diabetes mellitus with diabetic cataract: Secondary | ICD-10-CM | POA: Diagnosis not present

## 2021-12-30 DIAGNOSIS — Z794 Long term (current) use of insulin: Secondary | ICD-10-CM | POA: Diagnosis not present

## 2021-12-30 DIAGNOSIS — Z6836 Body mass index (BMI) 36.0-36.9, adult: Secondary | ICD-10-CM | POA: Diagnosis not present

## 2021-12-30 DIAGNOSIS — I10 Essential (primary) hypertension: Secondary | ICD-10-CM | POA: Diagnosis not present

## 2021-12-30 DIAGNOSIS — J449 Chronic obstructive pulmonary disease, unspecified: Secondary | ICD-10-CM | POA: Diagnosis not present

## 2022-01-02 ENCOUNTER — Other Ambulatory Visit: Payer: Self-pay | Admitting: Internal Medicine

## 2022-01-02 DIAGNOSIS — E119 Type 2 diabetes mellitus without complications: Secondary | ICD-10-CM

## 2022-01-14 ENCOUNTER — Other Ambulatory Visit: Payer: Self-pay | Admitting: Internal Medicine

## 2022-01-14 DIAGNOSIS — E119 Type 2 diabetes mellitus without complications: Secondary | ICD-10-CM

## 2022-01-14 NOTE — Telephone Encounter (Signed)
glucose blood (ACCU-CHEK GUIDE) test strip, REFILL REQUEST @ [facility], [place] [address]

## 2022-01-14 NOTE — Telephone Encounter (Signed)
Next appt scheduled 6/12 with Dr Humphrey Rolls.

## 2022-01-19 ENCOUNTER — Encounter: Payer: Self-pay | Admitting: Internal Medicine

## 2022-01-19 ENCOUNTER — Ambulatory Visit (INDEPENDENT_AMBULATORY_CARE_PROVIDER_SITE_OTHER): Payer: Medicare HMO | Admitting: Internal Medicine

## 2022-01-19 VITALS — BP 148/84 | HR 116 | Temp 98.4°F | Wt 259.1 lb

## 2022-01-19 DIAGNOSIS — F17210 Nicotine dependence, cigarettes, uncomplicated: Secondary | ICD-10-CM | POA: Diagnosis not present

## 2022-01-19 DIAGNOSIS — I10 Essential (primary) hypertension: Secondary | ICD-10-CM | POA: Diagnosis not present

## 2022-01-19 DIAGNOSIS — E785 Hyperlipidemia, unspecified: Secondary | ICD-10-CM

## 2022-01-19 DIAGNOSIS — Z794 Long term (current) use of insulin: Secondary | ICD-10-CM

## 2022-01-19 DIAGNOSIS — F319 Bipolar disorder, unspecified: Secondary | ICD-10-CM | POA: Diagnosis not present

## 2022-01-19 DIAGNOSIS — E010 Iodine-deficiency related diffuse (endemic) goiter: Secondary | ICD-10-CM | POA: Diagnosis not present

## 2022-01-19 DIAGNOSIS — E0590 Thyrotoxicosis, unspecified without thyrotoxic crisis or storm: Secondary | ICD-10-CM

## 2022-01-19 DIAGNOSIS — E119 Type 2 diabetes mellitus without complications: Secondary | ICD-10-CM

## 2022-01-19 LAB — POCT GLYCOSYLATED HEMOGLOBIN (HGB A1C): Hemoglobin A1C: 6.6 % — AB (ref 4.0–5.6)

## 2022-01-19 LAB — GLUCOSE, CAPILLARY: Glucose-Capillary: 123 mg/dL — ABNORMAL HIGH (ref 70–99)

## 2022-01-19 MED ORDER — LOSARTAN POTASSIUM-HCTZ 100-25 MG PO TABS
1.0000 | ORAL_TABLET | Freq: Every day | ORAL | 0 refills | Status: DC
Start: 2022-01-19 — End: 2022-04-14

## 2022-01-19 MED ORDER — SYNJARDY 5-1000 MG PO TABS: 1.0000 | ORAL_TABLET | Freq: Two times a day (BID) | ORAL | 0 refills | Status: DC

## 2022-01-19 NOTE — Patient Instructions (Addendum)
[name], it was a pleasure seeing you today! You endorsed feeling well today. Below are some of the things we talked about this visit. We look forward to seeing you in the follow up appointment!  Today we discussed: You came in for a blood pressure follow-up and a diabetes follow-up today.  Your blood pressure was elevated so we are starting a new medication.  Please stop taking the losartan and start taking the losartan HCTZ combination pill.  I would like to see you in 2 weeks to repeat lab work. For your diabetes we are stopping the insulin and starting a new medication.  Please stop taking metformin and start taking Synjardy which is a combination of metformin and another diabetes medication called SGLT 2 inhibitor. Your heart rate was elevated so we will repeat testing on your thyroid to ensure that is working normally.  Once I get the results for the thyroid and other lab work, I will give you a call and let you know the next steps.  It was a pleasure meeting you!   I have ordered the following labs today:  Lab Orders         Glucose, capillary         TSH         T4, Free         T3         Lipid Profile         POC Hbg A1C       Referrals ordered today:   Referral Orders  No referral(s) requested today     I have ordered the following medication/changed the following medications:   Stop the following medications: Medications Discontinued During This Encounter  Medication Reason   metFORMIN (GLUCOPHAGE) 1000 MG tablet Change in therapy   losartan (COZAAR) 100 MG tablet Change in therapy   NOVOLOG 100 UNIT/ML injection Change in therapy     Start the following medications: Meds ordered this encounter  Medications   losartan-hydrochlorothiazide (HYZAAR) 100-25 MG tablet    Sig: Take 1 tablet by mouth daily.    Dispense:  90 tablet    Refill:  0   Empagliflozin-metFORMIN HCl (SYNJARDY) 12-998 MG TABS    Sig: Take 1 tablet by mouth 2 (two) times daily.     Dispense:  90 tablet    Refill:  0     Follow-up: 2 week follow up with Dr. Humphrey Rolls  Please make sure to arrive 15 minutes prior to your next appointment. If you arrive late, you may be asked to reschedule.   We look forward to seeing you next time. Please call our clinic at [phone] if you have any questions or concerns. The best time to call is Monday-Friday from 9am-4pm, but there is someone available 24/7. If after hours or the weekend, call the main hospital number and ask for the Internal Medicine Resident On-Call. If you need medication refills, please notify your pharmacy one week in advance and they will send [place] a request.  Thank you for letting us take part in your care. Wishing you the best!  Thank you, Idamae Schuller, MD

## 2022-01-19 NOTE — Progress Notes (Unsigned)
CC: follow up  HPI:  Mr.Braxxton W Gienger is a 61 y.o. with medical history as below presenting to [facility] for follow up.   Please see problem-based list for further details, assessments, and plans.  Past Medical History:  Diagnosis Date   Beta-hemolytic group A streptococcal sepsis ([place])    Bipolar I disorder (HCC) 1991   Dr. Toy Care   Borderline hyperlipidemia    history of   Childhood asthma    COPD (chronic obstructive pulmonary disease) ([place])    Diabetes mellitus, new onset ([place]) 12/22/2017   Elevated glucose    Fibromyalgia    GERD (gastroesophageal reflux disease)    HTN (hypertension)    Low vitamin B12 level 06/07/2018   Lower back pain    Lumbar vertebral fracture (HCC) 01/29/2019   L1   Paranoia (HCC)    Tobacco abuse    Tobacco abuse    Vitamin D deficiency     Current Outpatient Medications (Endocrine & Metabolic):    metFORMIN (GLUCOPHAGE) 1000 MG tablet, TAKE 1 TABLET BY MOUTH TWICE DAILY WITH A MEAL   NOVOLOG 100 UNIT/ML injection, INJECT 15 UNITS SUBCUTANEOUSLY TWICE DAILY WITH A MEAL  Current Outpatient Medications (Cardiovascular):    amLODipine (NORVASC) 10 MG tablet, Take 1 tablet by mouth once daily   atorvastatin (LIPITOR) 40 MG tablet, Take 1 tablet (40 mg total) by mouth daily at 6 PM.   EPINEPHrine 0.3 mg/0.3 mL IJ SOAJ injection, Inject 0.3 mg into the muscle once as needed (for anaphylactic reaction to bee stings).   losartan (COZAAR) 100 MG tablet, Take 1 tablet (100 mg total) by mouth daily.  Current Outpatient Medications (Respiratory):    diphenhydrAMINE (BENADRYL) 25 mg capsule, Take 25 mg by mouth every 8 (eight) hours as needed for allergies.   ipratropium-albuterol (DUONEB) 0.5-2.5 (3) MG/3ML SOLN, Take 3 mLs by nebulization every 4 (four) hours as needed.   TRELEGY ELLIPTA 100-62.5-25 MCG/ACT AEPB, INHALE 1 PUFF ONCE DAILY   VENTOLIN HFA 108 (90 Base) MCG/ACT inhaler, INHALE 2 PUFFS BY MOUTH EVERY 6 HOURS AS NEEDED FOR WHEEZING OR  SHORTNESS OF BREATH  Current Outpatient Medications (Analgesics):    traMADol (ULTRAM) 50 MG tablet, Take 1 tablet by mouth twice daily as needed   Current Outpatient Medications (Other):    Accu-Chek FastClix Lancets MISC, Check blood sugar 3 times a day.   alprazolam (XANAX) 2 MG tablet, Take 2 mg by mouth at bedtime as needed for sleep or anxiety.    BD VEO INSULIN SYRINGE U/F 31G X 15/64" 0.3 ML MISC, USE TWICE DAILY TO INJECT INSULIN   blood glucose meter kit and supplies KIT, Dispense based on patient and insurance preference. Use up to four times daily as directed. (FOR ICD-9 250.00, 250.01).   Blood Glucose Monitoring Suppl (ACCU-CHEK GUIDE) w/Device KIT, 1 each by Does not apply route 3 (three) times daily.   Capsaicin 0.025 % PADS, Apply 1 application topically 3 (three) times daily as needed.   capsicum (ZOSTRIX) 0.075 % topical cream, Apply 1 application topically 3 (three) times daily as needed.   diclofenac sodium (VOLTAREN) 1 % GEL, Apply 2 g topically 4 (four) times daily.   glucose blood (ACCU-CHEK GUIDE) test strip, USE 1  THREE TIMES DAILY TO CHECK BLOOD SUGAR   lamoTRIgine (LAMICTAL) 150 MG tablet, Take 150 mg by mouth 2 (two) times daily.   lithium carbonate (ESKALITH) 450 MG CR tablet, Take 450-675 mg by mouth See admin instructions. 675 mg by mouth  in the morning and 450 mg at bedtime   methocarbamol (ROBAXIN) 500 MG tablet, Take by mouth.   multivitamin (ONE-A-DAY MEN'S) TABS tablet, Take 1 tablet by mouth daily.   omeprazole (PRILOSEC) 40 MG capsule, Take 1 capsule by mouth once daily   PFIZER COVID-19 VAC BIVALENT injection,    pregabalin (LYRICA) 150 MG capsule, Take 1 capsule by mouth twice daily   QUEtiapine (SEROQUEL) 50 MG tablet, Take 225 mg by mouth at bedtime.  Review of Systems:  Review of system negative unless stated in the problem list or HPI.    Physical Exam:  Vitals:   01/19/22 0936  BP: (!) 148/84  Pulse: (!) 116  Temp: 98.4 F (36.9 C)   TempSrc: Oral  SpO2: 99%  Weight: 259 lb 1.6 oz (117.5 kg)    Physical Exam General: NAD HENT: NCAT, thyromegaly noted. No TTP of thyroid cartilage.  Lungs: CTAB, no wheeze, rhonchi or rales.  Cardiovascular: tachycardic rate, sinus rhythm, no r/m/g, 2+ pulses in all extremities. No LE edema Abdomen: No TTP, normal bowel sounds MSK: No asymmetry or muscle atrophy.  Skin: no lesions noted on exposed skin Neuro: Alert and oriented x4. CN grossly intact Psych: Normal mood and normal affect   Assessment & Plan:   See Encounters Tab for problem based charting.  Patient discussed with Dr. Oren Binet, MD

## 2022-01-19 NOTE — Progress Notes (Unsigned)
CC: follow up  HPI:  Jesus Martin is a 61 y.o. with medical history as below presenting to [facility] for ***  Please see problem-based list for further details, assessments, and plans.  Past Medical History:  Diagnosis Date   Beta-hemolytic group A streptococcal sepsis ([place])    Bipolar I disorder (HCC) 1991   Dr. Toy Care   Borderline hyperlipidemia    history of   Childhood asthma    COPD (chronic obstructive pulmonary disease) ([place])    Diabetes mellitus, new onset ([place]) 12/22/2017   Elevated glucose    Fibromyalgia    GERD (gastroesophageal reflux disease)    HTN (hypertension)    Low vitamin B12 level 06/07/2018   Lower back pain    Lumbar vertebral fracture (HCC) 01/29/2019   L1   Paranoia (HCC)    Tobacco abuse    Tobacco abuse    Vitamin D deficiency     Current Outpatient Medications (Endocrine & Metabolic):    metFORMIN (GLUCOPHAGE) 1000 MG tablet, TAKE 1 TABLET BY MOUTH TWICE DAILY WITH A MEAL   NOVOLOG 100 UNIT/ML injection, INJECT 15 UNITS SUBCUTANEOUSLY TWICE DAILY WITH A MEAL  Current Outpatient Medications (Cardiovascular):    amLODipine (NORVASC) 10 MG tablet, Take 1 tablet by mouth once daily   atorvastatin (LIPITOR) 40 MG tablet, Take 1 tablet (40 mg total) by mouth daily at 6 PM.   EPINEPHrine 0.3 mg/0.3 mL IJ SOAJ injection, Inject 0.3 mg into the muscle once as needed (for anaphylactic reaction to bee stings).   losartan (COZAAR) 100 MG tablet, Take 1 tablet (100 mg total) by mouth daily.  Current Outpatient Medications (Respiratory):    diphenhydrAMINE (BENADRYL) 25 mg capsule, Take 25 mg by mouth every 8 (eight) hours as needed for allergies.   ipratropium-albuterol (DUONEB) 0.5-2.5 (3) MG/3ML SOLN, Take 3 mLs by nebulization every 4 (four) hours as needed.   TRELEGY ELLIPTA 100-62.5-25 MCG/ACT AEPB, INHALE 1 PUFF ONCE DAILY   VENTOLIN HFA 108 (90 Base) MCG/ACT inhaler, INHALE 2 PUFFS BY MOUTH EVERY 6 HOURS AS NEEDED FOR WHEEZING OR SHORTNESS OF  BREATH  Current Outpatient Medications (Analgesics):    traMADol (ULTRAM) 50 MG tablet, Take 1 tablet by mouth twice daily as needed   Current Outpatient Medications (Other):    Accu-Chek FastClix Lancets MISC, Check blood sugar 3 times a day.   alprazolam (XANAX) 2 MG tablet, Take 2 mg by mouth at bedtime as needed for sleep or anxiety.    BD VEO INSULIN SYRINGE U/F 31G X 15/64" 0.3 ML MISC, USE TWICE DAILY TO INJECT INSULIN   blood glucose meter kit and supplies KIT, Dispense based on patient and insurance preference. Use up to four times daily as directed. (FOR ICD-9 250.00, 250.01).   Blood Glucose Monitoring Suppl (ACCU-CHEK GUIDE) w/Device KIT, 1 each by Does not apply route 3 (three) times daily.   Capsaicin 0.025 % PADS, Apply 1 application topically 3 (three) times daily as needed.   capsicum (ZOSTRIX) 0.075 % topical cream, Apply 1 application topically 3 (three) times daily as needed.   diclofenac sodium (VOLTAREN) 1 % GEL, Apply 2 g topically 4 (four) times daily.   glucose blood (ACCU-CHEK GUIDE) test strip, USE 1  THREE TIMES DAILY TO CHECK BLOOD SUGAR   lamoTRIgine (LAMICTAL) 150 MG tablet, Take 150 mg by mouth 2 (two) times daily.   lithium carbonate (ESKALITH) 450 MG CR tablet, Take 450-675 mg by mouth See admin instructions. 675 mg by mouth in the  morning and 450 mg at bedtime   methocarbamol (ROBAXIN) 500 MG tablet, Take by mouth.   multivitamin (ONE-A-DAY MEN'S) TABS tablet, Take 1 tablet by mouth daily.   omeprazole (PRILOSEC) 40 MG capsule, Take 1 capsule by mouth once daily   PFIZER COVID-19 VAC BIVALENT injection,    pregabalin (LYRICA) 150 MG capsule, Take 1 capsule by mouth twice daily   QUEtiapine (SEROQUEL) 50 MG tablet, Take 225 mg by mouth at bedtime.  Review of Systems:  Review of system negative unless stated in the problem list or HPI.    Physical Exam:  There were no vitals filed for this visit.  Physical Exam General: NAD HENT: NCAT Lungs: CTAB, no  wheeze, rhonchi or rales.  Cardiovascular: Normal heart sounds, no r/m/g, 2+ pulses in all extremities. No LE edema Abdomen: No TTP, normal bowel sounds MSK: No asymmetry or muscle atrophy.  Skin: no lesions noted on exposed skin Neuro: Alert and oriented x4. CN grossly intact Psych: Normal mood and normal affect   Assessment & Plan:   See Encounters Tab for problem based charting.  Patient {GC/GE:[id number]::"discussed with","seen with"} Dr. {NAMES:[id number]::"Butcher","Guilloud","Hoffman","Mullen","Narendra","Raines","Vincent"} Idamae Schuller, MD   Assessment/Plan BP at goal/not at goal (< 130/80). BP in clinic today ***, HR ***. Reported home readings with SBP ***, and Dia ***. Does not monitor BP level at home; educated on the importance of it. Home medications include amlodipine 10 mg and losartan 100 mg. Reports good poor medication compliance. Tolerating medication without adverse effects. Denies headaches, vision changes, lightheadedness, chest pain, SHOB, leg swelling or changes in speech. Exam benign and vitals otherwise wnl. Last creatinine was 0.96 in 09/2021. Counseled on the importance of daily exercise, low salt diet, and weight loss. Continue *** Start *** BP log and bring to next visit  Continue lifestyle changes F/u on BMP *** (2-3 weeks after starting HCTZ, lasix or ACE/ARB and 6 mo-1 year on chronic therapy.)   COPD Smokes 3/4 ppd since age of 63. On trelegy inhaler, abluterol and duonebs as needed.   Assessment/Plan Patient's DMII is controlled/non-controlled. A1c *** today, *** *** ago. CBG *** today. Home monitoring with fasting glucose ranging ***. Current regimen includes Metformin 1000 mg qd, and insulin novolog 15 units BID. Reports good/poor medication adherence. Improving on current regimen/ Diabetes not at goal on current regimen, likely 2/2 poor medication compliance, diet, and poor follow up ***. Tolerating medication without adverse effects. Counseled on the  benefits of daily exercise, limiting processed foods and high sugar foods, and weight loss. No hypoglycemic episodes. Denies polydipsia, polyuria, weight loss, lethargy, blurry vision, and changes in sensation. No wounds or ulcer of bilateral feet. Benign physical exam and vitals wnl.   Continue *** CTM CBG's and bring glucometer to next visit  Continue lifestyle modifications Foot exam ***    Lipid Panel Assessment/Plan Patient has HLD with goal of primary/secondary prevention. Last lipid panel on 01/2021 showed Chol 127, LDL 58 . Reports compliance to lipitor 40 mg qd without adverse effects. Counseled on the importance of continued lifestyle modifications, including: weight loss, daily exercise, and healthy diet with limited processed and fatty foods.  Continue {GKLipitor:26792} Lipid panel this visit.  Encouraged to continue lifestyle modifications   Bipolar I disorder Stable on lithium, Lamictal and Seroquel.  -Continue these meds

## 2022-01-20 LAB — LIPID PANEL
Chol/HDL Ratio: 2.8 ratio (ref 0.0–5.0)
Cholesterol, Total: 88 mg/dL — ABNORMAL LOW (ref 100–199)
HDL: 31 mg/dL — ABNORMAL LOW (ref >39)
LDL Chol Calc (NIH): 22 mg/dL (ref 0–99)
Triglycerides: 225 mg/dL — ABNORMAL HIGH (ref 0–149)
VLDL Cholesterol Cal: 35 mg/dL (ref 5–40)

## 2022-01-20 LAB — T4, FREE: Free T4: 1.21 ng/dL (ref 0.82–1.77)

## 2022-01-20 LAB — TSH: TSH: 0.006 u[IU]/mL — ABNORMAL LOW (ref 0.450–4.500)

## 2022-01-20 LAB — T3: T3, Total: 103 ng/dL (ref 71–180)

## 2022-01-21 NOTE — Assessment & Plan Note (Addendum)
Patient has HLD with goal of primary prevention. Last lipid panel on 01/2021 showed Chol 127, LDL 58 . Reports compliance to lipitor 40 mg qd without adverse effects. Counseled on the importance of continued lifestyle modifications, including: weight loss, daily exercise, and healthy diet with limited processed and fatty foods.  -Continue lipitor 40 mg daily.  -Lipid panel this visit.  -Encouraged to continue lifestyle modifications   Addendum: Lipid panel shows total cholesterol 88 and LDL 22.

## 2022-01-21 NOTE — Assessment & Plan Note (Signed)
Assessment/Plan Patient's DMII is controlled/non-controlled. A1c 6.6 today, 6.2 5 months ago. CBG 123 today. Home monitoring with glucose ranging 120s-150s after eating. Current regimen includes Metformin 1000 mg BID, and insulin novolog 15 units BID. Reports poor insulin adherence. Used it 10 times in the last month. Improving on current regimen. Tolerating medication without adverse effects. Counseled on the benefits of daily exercise, limiting processed foods and high sugar foods, and weight loss. No hypoglycemic episodes. Denies polydipsia, polyuria, weight loss, lethargy, blurry vision, and changes in sensation. No wounds or ulcer of bilateral feet. Benign physical exam and vitals wnl.   -Discontinue insulin and metformin. Start Synjardy 12-998 mg BID. -CTM CBG's and bring glucometer to next visit  -Continue lifestyle modifications

## 2022-01-21 NOTE — Assessment & Plan Note (Signed)
Assessment/Plan BP at not at goal (< 130/80). BP in clinic today 148/84, HR 116. Reported home readings with SBP 140-160s, and Dia 90-100. Monitors BP at home. Home medications include amlodipine 10 mg and losartan 100 mg. Reports good medication compliance. Tolerating medication without adverse effects. Denies headaches, vision changes, lightheadedness, chest pain, SHOB, leg swelling or changes in speech. States he has been having increasing fatigue and palpitations. His elevated BP, palpitations may be secondary to hyperthyroidism. We will perform further thyroid testing. Last creatinine was 0.96 in 09/2021. Counseled on the importance of daily exercise, low salt diet, and weight loss. -Continue amlodipine 10 mg daily and change losartan to hyzaar 100-25 mg -BP log and bring to next visit  -Continue lifestyle changes -F/u on TSH, FT4 and Total T3  Addendum: TSH is 0.006, T3 is 103, and FT4 is 1.21

## 2022-01-21 NOTE — Progress Notes (Signed)
Internal Medicine Clinic Attending  Case discussed with Dr. Humphrey Rolls  At the time of the visit.  We reviewed the resident's history and exam and pertinent patient test results.  I agree with the assessment, diagnosis, and plan of care documented in the resident's note.  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 Will need to continue to monitor closely

## 2022-01-21 NOTE — Assessment & Plan Note (Addendum)
Patient has Bipolar I disorder at 61 years of age. He is stable on lithium, Lamictal and Seroquel. He follows with psychiatry. If he has new hyperthyroidism, then we will work up further with radioactive iodine uptake scan. -Continue current meds -Follow up on thyroid studies  Addendum: TSH is 0.006, T3 is 103, and FT4 is 1.21

## 2022-01-22 NOTE — Addendum Note (Signed)
Addended by: Idamae Schuller on: 01/22/2022 09:11 AM   Modules accepted: Orders

## 2022-01-26 ENCOUNTER — Other Ambulatory Visit: Payer: Self-pay | Admitting: Internal Medicine

## 2022-01-26 DIAGNOSIS — G8929 Other chronic pain: Secondary | ICD-10-CM

## 2022-01-26 NOTE — Telephone Encounter (Signed)
Next appt scheduled with Dr Humphrey Rolls 6/27.

## 2022-02-03 ENCOUNTER — Encounter: Payer: Self-pay | Admitting: Internal Medicine

## 2022-02-03 ENCOUNTER — Ambulatory Visit (INDEPENDENT_AMBULATORY_CARE_PROVIDER_SITE_OTHER): Payer: Medicare HMO | Admitting: Internal Medicine

## 2022-02-03 VITALS — BP 119/68 | HR 99 | Temp 97.9°F | Wt 258.3 lb

## 2022-02-03 VITALS — BP 119/68 | HR 99 | Wt 258.4 lb

## 2022-02-03 DIAGNOSIS — I10 Essential (primary) hypertension: Secondary | ICD-10-CM | POA: Diagnosis not present

## 2022-02-03 DIAGNOSIS — F17210 Nicotine dependence, cigarettes, uncomplicated: Secondary | ICD-10-CM | POA: Diagnosis not present

## 2022-02-03 DIAGNOSIS — E119 Type 2 diabetes mellitus without complications: Secondary | ICD-10-CM | POA: Diagnosis not present

## 2022-02-03 DIAGNOSIS — Z794 Long term (current) use of insulin: Secondary | ICD-10-CM

## 2022-02-03 DIAGNOSIS — R4189 Other symptoms and signs involving cognitive functions and awareness: Secondary | ICD-10-CM

## 2022-02-03 DIAGNOSIS — Z0000 Encounter for general adult medical examination without abnormal findings: Secondary | ICD-10-CM

## 2022-02-03 DIAGNOSIS — R413 Other amnesia: Secondary | ICD-10-CM

## 2022-02-03 DIAGNOSIS — E559 Vitamin D deficiency, unspecified: Secondary | ICD-10-CM | POA: Diagnosis not present

## 2022-02-03 DIAGNOSIS — E0590 Thyrotoxicosis, unspecified without thyrotoxic crisis or storm: Secondary | ICD-10-CM

## 2022-02-03 DIAGNOSIS — Z23 Encounter for immunization: Secondary | ICD-10-CM | POA: Diagnosis not present

## 2022-02-03 DIAGNOSIS — Z79899 Other long term (current) drug therapy: Secondary | ICD-10-CM | POA: Diagnosis not present

## 2022-02-03 NOTE — Progress Notes (Signed)
 CC: DMII/HTN follow up  HPI:  [name] is a 61 y.o. with medical history as below [facility] for DMII/HTN follow up.   Please see problem-based list for further details, assessments, and plans.  Past Medical History:  Diagnosis Date   Beta-hemolytic group A streptococcal sepsis (HCC)    Bipolar I disorder (HCC) 1991   Dr. Kaur   Borderline hyperlipidemia    history of   Childhood asthma    COPD (chronic obstructive pulmonary disease) (HCC)    Diabetes mellitus, new onset (HCC) 12/22/2017   Elevated glucose    Fibromyalgia    GERD (gastroesophageal reflux disease)    HTN (hypertension)    Low vitamin B12 level 06/07/2018   Lower back pain    Lumbar vertebral fracture (HCC) 01/29/2019   L1   Paranoia (HCC)    Tobacco abuse    Tobacco abuse    Vitamin D deficiency     Current Outpatient Medications (Endocrine & Metabolic):    Empagliflozin-metFORMIN HCl (SYNJARDY) 12-998 MG TABS, Take 1 tablet by mouth 2 (two) times daily.  Current Outpatient Medications (Cardiovascular):    amLODipine (NORVASC) 10 MG tablet, Take 1 tablet by mouth once daily   atorvastatin (LIPITOR) 40 MG tablet, Take 1 tablet (40 mg total) by mouth daily at 6 PM.   EPINEPHrine 0.3 mg/0.3 mL IJ SOAJ injection, Inject 0.3 mg into the muscle once as needed (for anaphylactic reaction to bee stings).   losartan-hydrochlorothiazide (HYZAAR) 100-25 MG tablet, Take 1 tablet by mouth daily.  Current Outpatient Medications (Respiratory):    diphenhydrAMINE (BENADRYL) 25 mg capsule, Take 25 mg by mouth every 8 (eight) hours as needed for allergies.   ipratropium-albuterol (DUONEB) 0.5-2.5 (3) MG/3ML SOLN, Take 3 mLs by nebulization every 4 (four) hours as needed.   TRELEGY ELLIPTA 100-62.5-25 MCG/ACT AEPB, INHALE 1 PUFF ONCE DAILY   VENTOLIN HFA 108 (90 Base) MCG/ACT inhaler, INHALE 2 PUFFS BY MOUTH EVERY 6 HOURS AS NEEDED FOR WHEEZING OR SHORTNESS OF BREATH  Current Outpatient Medications  (Analgesics):    traMADol (ULTRAM) 50 MG tablet, Take 1 tablet by mouth twice daily as needed   Current Outpatient Medications (Other):    Accu-Chek FastClix Lancets MISC, Check blood sugar 3 times a day.   alprazolam (XANAX) 2 MG tablet, Take 2 mg by mouth at bedtime as needed for sleep or anxiety.    BD VEO INSULIN SYRINGE U/F 31G X 15/64" 0.3 ML MISC, USE TWICE DAILY TO INJECT INSULIN   blood glucose meter kit and supplies KIT, Dispense based on patient and insurance preference. Use up to four times daily as directed. (FOR ICD-9 250.00, 250.01).   Blood Glucose Monitoring Suppl (ACCU-CHEK GUIDE) w/Device KIT, 1 each by Does not apply route 3 (three) times daily.   Capsaicin 0.025 % PADS, Apply 1 application topically 3 (three) times daily as needed.   capsicum (ZOSTRIX) 0.075 % topical cream, Apply 1 application topically 3 (three) times daily as needed.   diclofenac sodium (VOLTAREN) 1 % GEL, Apply 2 g topically 4 (four) times daily.   glucose blood (ACCU-CHEK GUIDE) test strip, USE 1  THREE TIMES DAILY TO CHECK BLOOD SUGAR   lamoTRIgine (LAMICTAL) 150 MG tablet, Take 150 mg by mouth 2 (two) times daily.   lithium carbonate (ESKALITH) 450 MG CR tablet, Take 450-675 mg by mouth See admin instructions. 675 mg by mouth in the morning and 450 mg at bedtime   methocarbamol (ROBAXIN) 500 MG tablet, Take   by mouth.   multivitamin (ONE-A-DAY MEN'S) TABS tablet, Take 1 tablet by mouth daily.   omeprazole (PRILOSEC) 40 MG capsule, Take 1 capsule by mouth once daily   PFIZER COVID-19 VAC BIVALENT injection,    pregabalin (LYRICA) 150 MG capsule, Take 1 capsule by mouth twice daily   QUEtiapine (SEROQUEL) 50 MG tablet, Take 225 mg by mouth at bedtime.  Review of Systems:  Review of system negative unless stated in the problem list or HPI.    Physical Exam:  Vitals:   02/03/22 0956  BP: 119/68  Pulse: 99  Temp: 97.9 F (36.6 C)  TempSrc: Oral  SpO2: 97%  Weight: 258 lb 4.8 oz (117.2 kg)     Physical Exam General: NAD HENT: NCAT, thyromegaly noted Lungs: CTAB, no wheeze, rhonchi or rales.  Cardiovascular: Normal heart sounds, no r/m/g, 2+ pulses in all extremities. 1+ LE edema Abdomen: No TTP, normal bowel sounds MSK: No asymmetry or muscle atrophy.  Skin: no lesions noted on exposed skin Neuro: Alert and oriented x4. CN grossly intact Psych: Normal mood and normal affect   Assessment & Plan:   See Encounters Tab for problem based charting.  Patient discussed with Dr. Mullen  , MD   

## 2022-02-03 NOTE — Progress Notes (Addendum)
 Subjective:   Jesus Martin is a 61 y.o. male who presents for an Initial Medicare Annual Wellness Visit..I connected with  Jesus Martin on 02/03/22 face to face and verified that I am speaking with the correct person using two identifiers.     Review of Systems    Defer to pcp       Objective:    Today's Vitals   02/03/22 0956  BP: 119/68  Pulse: 99  SpO2: 97%  Weight: 258 lb 6.1 oz (117.2 kg)   Body mass index is 35.04 kg/m.     02/03/2022   11:36 AM 02/03/2022   10:23 AM 01/19/2022    9:38 AM 10/09/2021    9:39 AM 09/26/2021   11:09 AM 08/28/2021    4:00 PM 06/17/2021    3:29 PM  Advanced Directives  Does Patient Have a Medical Advance Directive? No No No No No No No  Would patient like information on creating a medical advance directive? No - Patient declined No - Patient declined No - Patient declined No - Patient declined No - Patient declined No - Patient declined No - Patient declined    Current Medications (verified) Outpatient Encounter Medications as of 02/03/2022  Medication Sig   Accu-Chek FastClix Lancets MISC Check blood sugar 3 times a day.   alprazolam (XANAX) 2 MG tablet Take 2 mg by mouth at bedtime as needed for sleep or anxiety.    amLODipine (NORVASC) 10 MG tablet Take 1 tablet by mouth once daily   atorvastatin (LIPITOR) 40 MG tablet Take 1 tablet (40 mg total) by mouth daily at 6 PM.   BD VEO INSULIN SYRINGE U/F 31G X 15/64" 0.3 ML MISC USE TWICE DAILY TO INJECT INSULIN   blood glucose meter kit and supplies KIT Dispense based on patient and insurance preference. Use up to four times daily as directed. (FOR ICD-9 250.00, 250.01).   Blood Glucose Monitoring Suppl (ACCU-CHEK GUIDE) w/Device KIT 1 each by Does not apply route 3 (three) times daily.   Capsaicin 0.025 % PADS Apply 1 application topically 3 (three) times daily as needed.   capsicum (ZOSTRIX) 0.075 % topical cream Apply 1 application topically 3 (three) times daily as needed.    diclofenac sodium (VOLTAREN) 1 % GEL Apply 2 g topically 4 (four) times daily.   diphenhydrAMINE (BENADRYL) 25 mg capsule Take 25 mg by mouth every 8 (eight) hours as needed for allergies.   Empagliflozin-metFORMIN HCl (SYNJARDY) 12-998 MG TABS Take 1 tablet by mouth 2 (two) times daily.   EPINEPHrine 0.3 mg/0.3 mL IJ SOAJ injection Inject 0.3 mg into the muscle once as needed (for anaphylactic reaction to bee stings).   glucose blood (ACCU-CHEK GUIDE) test strip USE 1  THREE TIMES DAILY TO CHECK BLOOD SUGAR   ipratropium-albuterol (DUONEB) 0.5-2.5 (3) MG/3ML SOLN Take 3 mLs by nebulization every 4 (four) hours as needed.   lamoTRIgine (LAMICTAL) 150 MG tablet Take 150 mg by mouth 2 (two) times daily.   lithium carbonate (ESKALITH) 450 MG CR tablet Take 450-675 mg by mouth See admin instructions. 675 mg by mouth in the morning and 450 mg at bedtime   losartan-hydrochlorothiazide (HYZAAR) 100-25 MG tablet Take 1 tablet by mouth daily.   methocarbamol (ROBAXIN) 500 MG tablet Take by mouth.   multivitamin (ONE-A-DAY MEN'S) TABS tablet Take 1 tablet by mouth daily.   omeprazole (PRILOSEC) 40 MG capsule Take 1 capsule by mouth once daily   PFIZER COVID-19 VAC BIVALENT injection      pregabalin (LYRICA) 150 MG capsule Take 1 capsule by mouth twice daily   QUEtiapine (SEROQUEL) 50 MG tablet Take 225 mg by mouth at bedtime.   traMADol (ULTRAM) 50 MG tablet Take 1 tablet by mouth twice daily as needed   TRELEGY ELLIPTA 100-62.5-25 MCG/ACT AEPB INHALE 1 PUFF ONCE DAILY   VENTOLIN HFA 108 (90 Base) MCG/ACT inhaler INHALE 2 PUFFS BY MOUTH EVERY 6 HOURS AS NEEDED FOR WHEEZING OR SHORTNESS OF BREATH   No facility-administered encounter medications on file as of 02/03/2022.    Allergies (verified) Yellow jacket venom [bee venom], 5-alpha reductase inhibitors, and Aspirin   History: Past Medical History:  Diagnosis Date   Beta-hemolytic group A streptococcal sepsis (HCC)    Bipolar I disorder (HCC) 1991    Dr. Kaur   Borderline hyperlipidemia    history of   Childhood asthma    COPD (chronic obstructive pulmonary disease) (HCC)    Diabetes mellitus, new onset (HCC) 12/22/2017   Elevated glucose    Fibromyalgia    GERD (gastroesophageal reflux disease)    HTN (hypertension)    Low vitamin B12 level 06/07/2018   Lower back pain    Lumbar vertebral fracture (HCC) 01/29/2019   L1   Paranoia (HCC)    Tobacco abuse    Tobacco abuse    Vitamin D deficiency    Past Surgical History:  Procedure Laterality Date   APPENDECTOMY     1975   BACK SURGERY     1998   NOSE SURGERY     Family History  Adopted: Yes   Social History   Socioeconomic History   Marital status: Married    Spouse name: Not on file   Number of children: Not on file   Years of education: Not on file   Highest education level: Not on file  Occupational History   Not on file  Tobacco Use   Smoking status: Every Day    Packs/day: 0.75    Years: 45.00    Total pack years: 33.75    Types: Cigarettes   Smokeless tobacco: Never   Tobacco comments:    3/4 pack smoked daily ARJ 12/09/21  Vaping Use   Vaping Use: Never used  Substance and Sexual Activity   Alcohol use: Yes    Comment: Beer sometimes.   Drug use: No   Sexual activity: Not on file  Other Topics Concern   Not on file  Social History Narrative   Lives with wife Jesus Martin (married 28 years) and son. Has 4 dogs and fish. Not employed. Woodworking for fun. HS graduate.       Quit smoking in 2008. 26 pack years. No recreational drug use. Drinks 1 glass of wine per month. Does not exercise.    Social Determinants of Health   Financial Resource Strain: Low Risk  (02/03/2022)   Overall Financial Resource Strain (CARDIA)    Difficulty of Paying Living Expenses: Not hard at all  Food Insecurity: No Food Insecurity (02/03/2022)   Hunger Vital Sign    Worried About Running Out of Food in the Last Year: Never true    Ran Out of Food in the Last Year:  Never true  Transportation Needs: No Transportation Needs (02/03/2022)   PRAPARE - Transportation    Lack of Transportation (Medical): No    Lack of Transportation (Non-Medical): No  Physical Activity: Inactive (02/03/2022)   Exercise Vital Sign    Days of Exercise per Week: 0 days      Minutes of Exercise per Session: 0 min  Stress: Stress Concern Present (02/03/2022)   [place]    Feeling of Stress : To some extent  Social Connections: Moderately Isolated (02/03/2022)   Social Connection and Isolation Panel [NHANES]    Frequency of Communication with Friends and Family: Never    Frequency of Social Gatherings with Friends and Family: Once a week    Attends Religious Services: 1 to 4 times per year    Active Member of [employer] or Organizations: No    Attends Archivist Meetings: Never    Marital Status: Married    Tobacco Counseling Ready to quit: No Counseling given: Yes Tobacco comments: 3/4 pack smoked daily ARJ 12/09/21   Clinical Intake:  Pre-visit preparation completed: Yes        Nutritional Status: BMI > 30  Obese Nutritional Risks: None Diabetes: No  How often do you need to have someone help you when you read instructions, pamphlets, or other written materials from your doctor or pharmacy?: 2 - Rarely  Diabetic?NO  Interpreter Needed?: No  Information entered by :: [email or web address]   Activities of Daily Living    02/03/2022   10:25 AM 09/26/2021   11:06 AM  In your present state of health, do you have any difficulty performing the following activities:  Hearing? 0 0  Vision? 0 1  Difficulty concentrating or making decisions? 0 0  Walking or climbing stairs? 0 1  Dressing or bathing? 0 0  Doing errands, shopping? 0 1    Patient Care Team: Masters, Joellen Jersey, DO as PCP - General Aura Dials, MD as Referring Physician (Family Medicine)  Indicate any recent Medical Services you may  have received from other than [facility] providers in the past year (date may be approximate).     Assessment:   This is a routine wellness examination for Jesus Martin.  Hearing/Vision screen No results found.  Dietary issues and exercise activities discussed:     Goals Addressed   None   Depression Screen    02/03/2022   11:33 AM 02/03/2022   11:31 AM 02/03/2022   10:38 AM 09/26/2021   11:07 AM 08/28/2021    5:05 PM 06/17/2021    5:01 PM 06/17/2021    4:58 PM  PHQ 2/9 Scores  PHQ - 2 Score _0 PHQ- 9 Score _1 Exception Documentation     Patient refusal      Fall Risk    02/03/2022   11:34 AM 02/03/2022   10:25 AM 01/19/2022    9:33 AM 09/26/2021   11:06 AM 08/28/2021    3:59 PM  Fall Risk   Falls in the past year? 0 0 0 0 0  Number falls in past yr: 0 0 0 0 0  Injury with Fall? 0 0 0 0 0  Risk for fall due to : No Fall Risks No Fall Risks No Fall Risks  No Fall Risks  Follow up _2 ;Falls prevention discussed    FALL RISK PREVENTION PERTAINING TO THE HOME:  Any stairs in or around the home? No  If so, are there any without handrails? No  Home free of loose throw rugs in walkways, pet beds, electrical cords, etc? No  Adequate lighting in your home to reduce risk of falls? Yes  ASSISTIVE DEVICES UTILIZED TO PREVENT FALLS:  Life alert? No  Use of a cane, walker or w/c? No  Grab bars in the bathroom? No  Shower chair or bench in shower? No  Elevated toilet seat or a handicapped toilet? No   TIMED UP AND GO:  Was the test performed? No .  Length of time to ambulate 10 feet: 0 sec.   Gait steady and fast without use of assistive device  Cognitive Function:        02/03/2022   11:36 AM  6CIT Screen  What Year? 0 points  What month? 0 points  What time? 0 points  Count back from 20 0 points  Months in reverse 0  points  Repeat phrase 0 points  Total Score 0 points    Immunizations Immunization History  Administered Date(s) Administered   Influenza Split 06/24/2011   Influenza,inj,Quad PF,6+ Mos 03/28/2018, 05/08/2019, 06/12/2020, 04/21/2021   PFIZER(Purple Top)SARS-COV-2 Vaccination 03/22/2020, 04/16/2020   Pneumococcal Polysaccharide-23 09/23/2017   Tdap 06/24/2011, 02/03/2022    TDAP status: Completed at today's visit  Flu Vaccine status: Up to date  Pneumococcal vaccine status: Up to date  Covid-19 vaccine status: Completed vaccines  Qualifies for Shingles Vaccine? No   Zostavax completed No   Shingrix Completed?: No.    Education has been provided regarding the importance of this vaccine. Patient has been advised to call insurance company to determine out of pocket expense if they have not yet received this vaccine. Advised may also receive vaccine at local pharmacy or Health Dept. Verbalized acceptance and understanding.  Screening Tests Health Maintenance  Topic Date Due   Zoster Vaccines- Shingrix (1 of 2) Never done   COVID-19 Vaccine (3 - Pfizer series) 06/11/2020   INFLUENZA VACCINE  03/10/2022   FOOT EXAM  04/21/2022   HEMOGLOBIN A1C  07/21/2022   COLONOSCOPY (Pts 45-49yrs Insurance coverage will need to be confirmed)  08/12/2022   OPHTHALMOLOGY EXAM  11/13/2022   TETANUS/TDAP  02/04/2032   Hepatitis C Screening  Completed   HIV Screening  Completed   HPV VACCINES  Aged Out    Health Maintenance  Health Maintenance Due  Topic Date Due   Zoster Vaccines- Shingrix (1 of 2) Never done   COVID-19 Vaccine (3 - Pfizer series) 06/11/2020    Colorectal cancer screening: Type of screening: Colonoscopy. Completed 2014. Repeat every 10 years  Lung Cancer Screening: (Low Dose CT Chest recommended if Age 55-80 years, 30 pack-year currently smoking OR have quit w/in 15years.) does not qualify. Completed on 10/24/2021  Lung Cancer Screening Referral: n/a  Additional  Screening:  Hepatitis C Screening: does not qualify; Completed 09/26/2017  Vision Screening: Recommended annual ophthalmology exams for early detection of glaucoma and other disorders of the eye. Is the patient up to date with their annual eye exam?  Yes  Who is the provider or what is the name of the office in which the patient attends annual eye exams? [facility] ([address]) If pt is not established with a provider, would they like to be referred to a provider to establish care?  N/A .   Dental Screening: Recommended annual dental exams for proper oral hygiene  Community Resource Referral / Chronic Care Management: CRR required this visit?  No   CCM required this visit?  No      Plan:     I have personally reviewed and noted the following in the patient's chart:   Medical and social history Use of   alcohol, tobacco or illicit drugs  Current medications and supplements including opioid prescriptions. Patient is not currently taking opioid prescriptions. Functional ability and status Nutritional status Physical activity Advanced directives List of other physicians Hospitalizations, surgeries, and ER visits in previous 12 months Vitals Screenings to include cognitive, depression, and falls Referrals and appointments  In addition, I have reviewed and discussed with patient certain preventive protocols, quality metrics, and best practice recommendations. A written personalized care plan for preventive services as well as general preventive health recommendations were provided to patient.     ,  [place], CMA   02/03/2022   Nurse Notes: face to face 25   Jesus Martin , Thank you for taking time to come for your Medicare Wellness Visit. I appreciate your ongoing commitment to your health goals. Please review the following plan we discussed and let me know if I can assist you in the future.   These are the goals we discussed:  Goals   None     This is a  list of the screening recommended for you and due dates:  Health Maintenance  Topic Date Due   Zoster (Shingles) Vaccine (1 of 2) Never done   COVID-19 Vaccine (3 - Pfizer series) 06/11/2020   Flu Shot  03/10/2022   Complete foot exam   04/21/2022   Hemoglobin A1C  07/21/2022   Colon Cancer Screening  08/12/2022   Eye exam for diabetics  11/13/2022   Tetanus Vaccine  02/04/2032   Hepatitis C Screening: USPSTF Recommendation to screen - Ages 18-79 yo.  Completed   HIV Screening  Completed   HPV Vaccine  Aged Out      Jesus Martin , Thank you for taking time to come for your Medicare Wellness Visit. I appreciate your ongoing commitment to your health goals. Please review the following plan we discussed and let me know if I can assist you in the future.   These are the goals we discussed:  Goals   None     This is a list of the screening recommended for you and due dates:  Health Maintenance  Topic Date Due   Zoster (Shingles) Vaccine (1 of 2) Never done   COVID-19 Vaccine (3 - Pfizer series) 06/11/2020   Flu Shot  03/10/2022   Complete foot exam   04/21/2022   Hemoglobin A1C  07/21/2022   Colon Cancer Screening  08/12/2022   Eye exam for diabetics  11/13/2022   Tetanus Vaccine  02/04/2032   Hepatitis C Screening: USPSTF Recommendation to screen - Ages 18-79 yo.  Completed   HIV Screening  Completed   HPV Vaccine  Aged Out         

## 2022-02-04 LAB — BMP8+ANION GAP
Anion Gap: 17 mmol/L (ref 10.0–18.0)
BUN/Creatinine Ratio: 11 (ref 10–24)
BUN: 14 mg/dL (ref 8–27)
CO2: 22 mmol/L (ref 20–29)
Calcium: 10.3 mg/dL — ABNORMAL HIGH (ref 8.6–10.2)
Chloride: 98 mmol/L (ref 96–106)
Creatinine, Ser: 1.25 mg/dL (ref 0.76–1.27)
Glucose: 121 mg/dL — ABNORMAL HIGH (ref 70–99)
Potassium: 4.4 mmol/L (ref 3.5–5.2)
Sodium: 137 mmol/L (ref 134–144)
eGFR: 66 mL/min/{1.73_m2} (ref >59)

## 2022-02-04 LAB — VITAMIN D 25 HYDROXY (VIT D DEFICIENCY, FRACTURES): Vit D, 25-Hydroxy: 47.8 ng/mL (ref 30.0–100.0)

## 2022-02-04 LAB — LITHIUM LEVEL: Lithium Lvl: 0.8 mmol/L (ref 0.5–1.2)

## 2022-02-04 LAB — VITAMIN B12: Vitamin B-12: 569 pg/mL (ref 232–1245)

## 2022-02-05 DIAGNOSIS — R413 Other amnesia: Secondary | ICD-10-CM | POA: Insufficient documentation

## 2022-02-05 NOTE — Assessment & Plan Note (Addendum)
Patient's laboratory data is consistent with subclinical hyperthyroidism. Lithium level was checked and is in therapeutic range. Thyroid scan still pending. We will pursue this given thyromegaly appreciated on exam.  We will continue lithium at correct dose.  -Follow up on NM thyroid scan.

## 2022-02-05 NOTE — Assessment & Plan Note (Signed)
Only taking Synjardy 12-998 mg BID for his diabetes. His highest reading is 139 with lowest in 109.  A1c 6.6 this month. We will continue current regimen and follow up in 3 months with an A1c and make adjustment if needed.

## 2022-02-05 NOTE — Assessment & Plan Note (Signed)
Patient has symptoms of cognitive decline. DDx include medication side effect vs alzheimer's vs parkinson's vs reversible causes including Vit B12 and Vit D deficiency vs hypothyroidism vs neurosyphilis vs lyme disease. We will obtain Vitamin B12 and Vit D levels and have patient follow up in a couple weeks for testing including MoCA and MMSE. He may benefit from neurology referral.  -Vitamin B12 and D ordered, both within normal limits.  -Follow up for a dedicated visit in 2 weeks for further workup.

## 2022-02-05 NOTE — Assessment & Plan Note (Signed)
BP at goal. BP in clinic today 116/68, HR 99. Reported home readings with SBP 120-130s, and Dia 70s. Occasional elevated reading in 160s. Home medications include Amlodipine 10 mg, Hyzaar 100-25 mg. Reports good medication compliance. Tolerating medication without adverse effects. Denies headaches, vision changes, lightheadedness, chest pain, SHOB, leg swelling or changes in speech. Exam benign and vitals otherwise wnl. Last creatinine was 09.6 in 09/2021. Counseled on the importance of daily exercise, low salt diet, and weight loss. -Continue current meds -BP log and bring to next visit  -Continue lifestyle changes -F/u on BMP today, BMP shows normal renal fxn. His hypercalcemia is slightly better at 10.3 today. He is taking a multivitamin which can be contributing along with the thiazide and will request patient hold the multivitamin for the time being.

## 2022-02-10 NOTE — Progress Notes (Signed)
Internal Medicine Clinic Attending  Case discussed with Dr. Humphrey Rolls  at the time of the visit.  We reviewed the resident's history and exam and pertinent patient test results.  I agree with the assessment, diagnosis, and plan of care documented in the resident's note.

## 2022-02-11 ENCOUNTER — Other Ambulatory Visit: Payer: Self-pay | Admitting: Internal Medicine

## 2022-02-11 DIAGNOSIS — E119 Type 2 diabetes mellitus without complications: Secondary | ICD-10-CM

## 2022-02-11 NOTE — Telephone Encounter (Signed)
Next appt scheduled 7/11 with PCP.

## 2022-02-17 ENCOUNTER — Other Ambulatory Visit: Payer: Self-pay

## 2022-02-17 ENCOUNTER — Encounter: Payer: Self-pay | Admitting: Internal Medicine

## 2022-02-17 ENCOUNTER — Ambulatory Visit (INDEPENDENT_AMBULATORY_CARE_PROVIDER_SITE_OTHER): Payer: Medicare HMO | Admitting: Internal Medicine

## 2022-02-17 VITALS — BP 137/80 | HR 97 | Temp 97.5°F | Ht 72.0 in | Wt 254.3 lb

## 2022-02-17 DIAGNOSIS — R413 Other amnesia: Secondary | ICD-10-CM

## 2022-02-17 DIAGNOSIS — Z7984 Long term (current) use of oral hypoglycemic drugs: Secondary | ICD-10-CM | POA: Diagnosis not present

## 2022-02-17 DIAGNOSIS — I10 Essential (primary) hypertension: Secondary | ICD-10-CM | POA: Diagnosis not present

## 2022-02-17 DIAGNOSIS — E1142 Type 2 diabetes mellitus with diabetic polyneuropathy: Secondary | ICD-10-CM

## 2022-02-17 DIAGNOSIS — F17210 Nicotine dependence, cigarettes, uncomplicated: Secondary | ICD-10-CM

## 2022-02-17 MED ORDER — CARVEDILOL 12.5 MG PO TABS: 12.5000 mg | ORAL_TABLET | Freq: Two times a day (BID) | ORAL | 2 refills | Status: DC

## 2022-02-17 NOTE — Patient Instructions (Signed)
Thank you, Mr.Mikhai W Seltzer for allowing [place] to provide your care today. Today we discussed:  Blood pressure- continue taking amlodipine and Hyzaar. I have added a medication that will affect heart rate and blood pressure called carvedilol. Take this in the morning and at night. Follow-up in one month. Good job on checking blood pressure and home please continue and bring log back in.    I have ordered the following labs for you:  Lab Orders         ToxAssure Select,+Antidepr,UR         BMP8+Anion Gap       Referrals ordered today:   Referral Orders  No referral(s) requested today     I have ordered the following medication/changed the following medications:   Stop the following medications: There are no discontinued medications.   Start the following medications: Meds ordered this encounter  Medications   carvedilol (COREG) 12.5 MG tablet    Sig: Take 1 tablet (12.5 mg total) by mouth 2 (two) times daily with a meal.    Dispense:  60 tablet    Refill:  2     Follow up:  1 month     We look forward to seeing you next time. Please call our clinic at [phone] if you have any questions or concerns. The best time to call is Monday-Friday from 9am-4pm, but there is someone available 24/7. If after hours or the weekend, call the main hospital number and ask for the Internal Medicine Resident On-Call. If you need medication refills, please notify your pharmacy one week in advance and they will send [place] a request.   Thank you for trusting me with your care. Wishing you the best!   Christiana Fuchs, [place]

## 2022-02-17 NOTE — Progress Notes (Addendum)
Subjective:  CC: blood pressure  HPI:  Jesus Martin is a 61 y.o. male with a past medical history stated below and presents today for follow-up on hypertension. Please see problem based assessment and plan for additional details.  Past Medical History:  Diagnosis Date   Beta-hemolytic group A streptococcal sepsis ([place])    Bipolar I disorder (HCC) 1991   Dr. Toy Care   Borderline hyperlipidemia    history of   Childhood asthma    COPD (chronic obstructive pulmonary disease) ([place])    Diabetes mellitus, new onset ([place]) 12/22/2017   Elevated glucose    Fibromyalgia    GERD (gastroesophageal reflux disease)    HTN (hypertension)    Low vitamin B12 level 06/07/2018   Lower back pain    Lumbar vertebral fracture (HCC) 01/29/2019   L1   Paranoia ([place])    Tobacco abuse    Tobacco abuse    Vitamin D deficiency     Current Outpatient Medications on File Prior to Visit  Medication Sig Dispense Refill   Accu-Chek FastClix Lancets MISC Check blood sugar 3 times a day. 102 each 11   alprazolam (XANAX) 2 MG tablet Take 2 mg by mouth at bedtime as needed for sleep or anxiety.      amLODipine (NORVASC) 10 MG tablet Take 1 tablet by mouth once daily 90 tablet 2   atorvastatin (LIPITOR) 40 MG tablet Take 1 tablet (40 mg total) by mouth daily at 6 PM. 90 tablet 3   BD VEO INSULIN SYRINGE U/F 31G X 15/64" 0.3 ML MISC USE TWICE DAILY TO INJECT INSULIN 100 each 0   blood glucose meter kit and supplies KIT Dispense based on patient and insurance preference. Use up to four times daily as directed. (FOR ICD-9 250.00, 250.01). 1 each 0   Blood Glucose Monitoring Suppl (ACCU-CHEK GUIDE) w/Device KIT 1 each by Does not apply route 3 (three) times daily. 1 kit 1   Capsaicin 0.025 % PADS Apply 1 application topically 3 (three) times daily as needed. 20 each 0   capsicum (ZOSTRIX) 0.075 % topical cream Apply 1 application topically 3 (three) times daily as needed. 114 g 0   diclofenac sodium  (VOLTAREN) 1 % GEL Apply 2 g topically 4 (four) times daily. 350 g 6   diphenhydrAMINE (BENADRYL) 25 mg capsule Take 25 mg by mouth every 8 (eight) hours as needed for allergies.     Empagliflozin-metFORMIN HCl (SYNJARDY) 12-998 MG TABS Take 1 tablet by mouth 2 (two) times daily. 90 tablet 0   EPINEPHrine 0.3 mg/0.3 mL IJ SOAJ injection Inject 0.3 mg into the muscle once as needed (for anaphylactic reaction to bee stings).     glucose blood (ACCU-CHEK GUIDE) test strip USE 1  THREE TIMES DAILY TO CHECK BLOOD SUGAR 100 each 9   ipratropium-albuterol (DUONEB) 0.5-2.5 (3) MG/3ML SOLN Take 3 mLs by nebulization every 4 (four) hours as needed. 360 mL 1   lamoTRIgine (LAMICTAL) 150 MG tablet Take 150 mg by mouth 2 (two) times daily.     lithium carbonate (ESKALITH) 450 MG CR tablet Take 450-675 mg by mouth See admin instructions. 675 mg by mouth in the morning and 450 mg at bedtime     losartan-hydrochlorothiazide (HYZAAR) 100-25 MG tablet Take 1 tablet by mouth daily. 90 tablet 0   methocarbamol (ROBAXIN) 500 MG tablet Take by mouth.     multivitamin (ONE-A-DAY MEN'S) TABS tablet Take 1 tablet by mouth daily.  omeprazole (PRILOSEC) 40 MG capsule Take 1 capsule by mouth once daily 90 capsule 2   PFIZER COVID-19 VAC BIVALENT injection      pregabalin (LYRICA) 150 MG capsule Take 1 capsule by mouth twice daily 60 capsule 0   QUEtiapine (SEROQUEL) 50 MG tablet Take 225 mg by mouth at bedtime.     traMADol (ULTRAM) 50 MG tablet Take 1 tablet by mouth twice daily as needed 60 tablet 0   TRELEGY ELLIPTA 100-62.5-25 MCG/ACT AEPB INHALE 1 PUFF ONCE DAILY 60 each 3   VENTOLIN HFA 108 (90 Base) MCG/ACT inhaler INHALE 2 PUFFS BY MOUTH EVERY 6 HOURS AS NEEDED FOR WHEEZING OR SHORTNESS OF BREATH 18 g 3   No current facility-administered medications on file prior to visit.    Family History  Adopted: Yes    Social History   Socioeconomic History   Marital status: Married    Spouse name: Not on file    Number of children: Not on file   Years of education: Not on file   Highest education level: Not on file  Occupational History   Not on file  Tobacco Use   Smoking status: Every Day    Packs/day: 0.75    Years: 45.00    Total pack years: 33.75    Types: Cigarettes   Smokeless tobacco: Never   Tobacco comments:    3/4 pack smoked daily ARJ 12/09/21  Vaping Use   Vaping Use: Never used  Substance and Sexual Activity   Alcohol use: Yes    Comment: Beer sometimes.   Drug use: No   Sexual activity: Not on file  Other Topics Concern   Not on file  Social History Narrative   Lives with wife Dymond Spreen (married 8 years) and son. Has 4 dogs and fish. Not employed. Woodworking for fun. HS graduate.       Quit smoking in 2008. 26 pack years. No recreational drug use. Drinks 1 glass of wine per month. Does not exercise.    Social Determinants of Health   Financial Resource Strain: Low Risk  (02/03/2022)   Overall Financial Resource Strain (CARDIA)    Difficulty of Paying Living Expenses: Not hard at all  Food Insecurity: No Food Insecurity (02/03/2022)   Hunger Vital Sign    Worried About Running Out of Food in the Last Year: Never true    Ran Out of Food in the Last Year: Never true  Transportation Needs: No Transportation Needs (02/03/2022)   PRAPARE - Hydrologist (Medical): No    Lack of Transportation (Non-Medical): No  Physical Activity: Inactive (02/03/2022)   Exercise Vital Sign    Days of Exercise per Week: 0 days    Minutes of Exercise per Session: 0 min  Stress: Stress Concern Present (02/03/2022)   [place]    Feeling of Stress : To some extent  Social Connections: Moderately Isolated (02/03/2022)   Social Connection and Isolation Panel [NHANES]    Frequency of Communication with Friends and Family: Never    Frequency of Social Gatherings with Friends and Family: Once a  week    Attends Religious Services: 1 to 4 times per year    Active Member of [employer] or Organizations: No    Attends Archivist Meetings: Never    Marital Status: Married  Human resources officer Violence: Not At Risk (02/03/2022)   Humiliation, Afraid, Rape, and Kick questionnaire  Fear of Current or Ex-Partner: No    Emotionally Abused: No    Physically Abused: No    Sexually Abused: No    Review of Systems: ROS negative except for what is noted on the assessment and plan.  Objective:   Vitals:   02/17/22 1030  BP: 137/80  Pulse: 97  Temp: (!) 97.5 F (36.4 C)  TempSrc: Oral  SpO2: 97%  Weight: 254 lb 4.8 oz (115.3 kg)  Height: 6' (1.829 m)    Physical Exam: Constitutional: well-appearing, in no acute distress Cardiovascular: regular rate and rhythm, no m/r/g Pulmonary/Chest: normal work of breathing on room air, lungs clear to auscultation bilaterally MSK: normal bulk and tone Skin: warm and dry Psych: normal mood and affect     Assessment & Plan:  Memory loss Patient requests deferring talking about memory loss until next visit as they have to pick up their kids from school.  Hypertension Mr. Mccambridge presents for follow-up on hypertension.  He has been checking his blood pressure at home and states that blood pressures range from 615P to 794F systolic.  He has been taking amlodipine 10 and losartan-HCTZ 100-25 mg each morning.  He denies blurry vision, chest pain, shortness of breath.  He has noted increased urinary frequency since starting Hyzaar, but states that he is okay with changing symptoms.  His blood pressure in clinic is at 137/80 with heart rate of 97. A/P: Blood pressure remains elevated despite addition of agents 2 weeks ago.  He states that he does have snoring, his wife reports seeing him to the operating, and morning headaches.  Stop bang score elevated at 7 or high risk.  He states that he has no interest in having a sleep study done as he cannot  tolerate sleeping with the mask on his face.  We will add a fourth agent today for blood pressure control.  If pressures remain uncontrolled could consider further evaluation for other causes for secondary hypertension.  Could also consider switching losartan to olmesartan. -Continue amlodipine 10 mg and losartan-HCTZ 100-25 mg -Start carvedilol 12.5 mg twice daily -Recheck BMP with addition of HCTZ 2 weeks ago  ADDENDUM 7/14: BMP showed K wnl. Calcium remains mildly elevated at 10.5. prior albumin at 4.8. Corrected calcium at 9.9.  Diabetic neuropathy [facility]) Patient takes Lyrica for peripheral neuropathy. - Repeat tox assure   Patient discussed with Dr. Angelia Mould   Kniyah Khun, D.O. [facility]  PGY-2 Pager: [phone]  Phone: [phone] Date 02/20/2022  Time 9:31 AM

## 2022-02-18 LAB — BMP8+ANION GAP
Anion Gap: 19 mmol/L — ABNORMAL HIGH (ref 10.0–18.0)
BUN/Creatinine Ratio: 16 (ref 10–24)
BUN: 18 mg/dL (ref 8–27)
CO2: 21 mmol/L (ref 20–29)
Calcium: 10.5 mg/dL — ABNORMAL HIGH (ref 8.6–10.2)
Chloride: 99 mmol/L (ref 96–106)
Creatinine, Ser: 1.16 mg/dL (ref 0.76–1.27)
Glucose: 125 mg/dL — ABNORMAL HIGH (ref 70–99)
Potassium: 4.2 mmol/L (ref 3.5–5.2)
Sodium: 139 mmol/L (ref 134–144)
eGFR: 72 mL/min/{1.73_m2} (ref >59)

## 2022-02-18 NOTE — Assessment & Plan Note (Signed)
Patient requests deferring talking about memory loss until next visit as they have to pick up their kids from school.

## 2022-02-18 NOTE — Assessment & Plan Note (Signed)
Patient takes Lyrica for peripheral neuropathy. - Repeat tox assure

## 2022-02-18 NOTE — Assessment & Plan Note (Addendum)
Jesus Martin presents for follow-up on hypertension.  He has been checking his blood pressure at home and states that blood pressures range from 998P to 382N systolic.  He has been taking amlodipine 10 and losartan-HCTZ 100-25 mg each morning.  He denies blurry vision, chest pain, shortness of breath.  He has noted increased urinary frequency since starting Hyzaar, but states that he is okay with changing symptoms.  His blood pressure in clinic is at 137/80 with heart rate of 97. A/P: Blood pressure remains elevated despite addition of agents 2 weeks ago.  He states that he does have snoring, his wife reports seeing him to the operating, and morning headaches.  Stop bang score elevated at 7 or high risk.  He states that he has no interest in having a sleep study done as he cannot tolerate sleeping with the mask on his face.  We will add a fourth agent today for blood pressure control.  If pressures remain uncontrolled could consider further evaluation for other causes for secondary hypertension.  Could also consider switching losartan to olmesartan. -Continue amlodipine 10 mg and losartan-HCTZ 100-25 mg -Start carvedilol 12.5 mg twice daily -Recheck BMP with addition of HCTZ 2 weeks ago  ADDENDUM 7/14: BMP showed K wnl. Calcium remains mildly elevated at 10.5. prior albumin at 4.8. Corrected calcium at 9.9.

## 2022-02-21 LAB — TOXASSURE SELECT,+ANTIDEPR,UR

## 2022-02-22 ENCOUNTER — Other Ambulatory Visit: Payer: Self-pay | Admitting: Internal Medicine

## 2022-02-23 ENCOUNTER — Other Ambulatory Visit: Payer: Self-pay | Admitting: Internal Medicine

## 2022-02-23 DIAGNOSIS — G8929 Other chronic pain: Secondary | ICD-10-CM

## 2022-02-25 NOTE — Progress Notes (Signed)
Internal Medicine Clinic Attending  Case discussed with the resident at the time of the visit.  We reviewed the resident's history and exam and pertinent patient test results.  I agree with the assessment, diagnosis, and plan of care documented in the resident's note.  

## 2022-02-25 NOTE — Progress Notes (Signed)
Internal Medicine Clinic Attending  Case and documentation reviewed.  I reviewed the AWV findings.  I agree with the assessment, diagnosis, and plan of care documented in the AWV note.

## 2022-02-27 ENCOUNTER — Other Ambulatory Visit: Payer: Self-pay | Admitting: Internal Medicine

## 2022-02-27 DIAGNOSIS — E119 Type 2 diabetes mellitus without complications: Secondary | ICD-10-CM

## 2022-03-10 ENCOUNTER — Ambulatory Visit (HOSPITAL_COMMUNITY): Payer: Medicare HMO | Attending: Internal Medicine

## 2022-03-10 ENCOUNTER — Ambulatory Visit (HOSPITAL_COMMUNITY): Payer: Medicare HMO

## 2022-03-10 ENCOUNTER — Encounter (HOSPITAL_COMMUNITY): Payer: Medicare HMO

## 2022-03-11 ENCOUNTER — Other Ambulatory Visit (HOSPITAL_COMMUNITY): Payer: Medicare HMO

## 2022-03-11 ENCOUNTER — Other Ambulatory Visit: Payer: Self-pay | Admitting: Internal Medicine

## 2022-03-11 ENCOUNTER — Encounter (HOSPITAL_COMMUNITY): Payer: Medicare HMO | Attending: Internal Medicine

## 2022-03-11 DIAGNOSIS — E119 Type 2 diabetes mellitus without complications: Secondary | ICD-10-CM

## 2022-03-20 ENCOUNTER — Other Ambulatory Visit: Payer: Self-pay | Admitting: Internal Medicine

## 2022-03-20 DIAGNOSIS — G8929 Other chronic pain: Secondary | ICD-10-CM

## 2022-04-09 ENCOUNTER — Other Ambulatory Visit: Payer: Self-pay | Admitting: Internal Medicine

## 2022-04-09 DIAGNOSIS — E119 Type 2 diabetes mellitus without complications: Secondary | ICD-10-CM

## 2022-04-12 ENCOUNTER — Other Ambulatory Visit: Payer: Self-pay | Admitting: Internal Medicine

## 2022-04-12 DIAGNOSIS — I10 Essential (primary) hypertension: Secondary | ICD-10-CM

## 2022-04-14 NOTE — Telephone Encounter (Signed)
Please have patient follow-up for blood pressure

## 2022-04-19 ENCOUNTER — Other Ambulatory Visit: Payer: Self-pay | Admitting: Internal Medicine

## 2022-04-19 DIAGNOSIS — G8929 Other chronic pain: Secondary | ICD-10-CM

## 2022-04-20 NOTE — Telephone Encounter (Signed)
Next appt scheduled 9/19 with PCP.

## 2022-04-20 NOTE — Telephone Encounter (Signed)
PDMP reviewed and appropriate

## 2022-04-28 ENCOUNTER — Encounter: Payer: Medicare HMO | Admitting: Internal Medicine

## 2022-04-28 ENCOUNTER — Encounter: Payer: Self-pay | Admitting: Internal Medicine

## 2022-05-11 ENCOUNTER — Other Ambulatory Visit: Payer: Self-pay | Admitting: Internal Medicine

## 2022-05-11 DIAGNOSIS — E119 Type 2 diabetes mellitus without complications: Secondary | ICD-10-CM

## 2022-05-11 DIAGNOSIS — E1142 Type 2 diabetes mellitus with diabetic polyneuropathy: Secondary | ICD-10-CM

## 2022-05-18 ENCOUNTER — Other Ambulatory Visit: Payer: Self-pay | Admitting: Internal Medicine

## 2022-05-18 DIAGNOSIS — G8929 Other chronic pain: Secondary | ICD-10-CM

## 2022-05-18 NOTE — Telephone Encounter (Signed)
PDMP reviewed and appropriate

## 2022-05-27 ENCOUNTER — Encounter: Payer: Medicare HMO | Admitting: Student

## 2022-06-09 ENCOUNTER — Other Ambulatory Visit: Payer: Self-pay | Admitting: Family Medicine

## 2022-06-09 ENCOUNTER — Encounter: Payer: Self-pay | Admitting: Internal Medicine

## 2022-06-09 ENCOUNTER — Ambulatory Visit (INDEPENDENT_AMBULATORY_CARE_PROVIDER_SITE_OTHER): Payer: Medicare HMO | Admitting: Internal Medicine

## 2022-06-09 ENCOUNTER — Other Ambulatory Visit: Payer: Self-pay

## 2022-06-09 ENCOUNTER — Other Ambulatory Visit: Payer: Self-pay | Admitting: Internal Medicine

## 2022-06-09 VITALS — BP 129/79 | HR 80 | Temp 97.5°F | Resp 28 | Ht 72.0 in | Wt 240.5 lb

## 2022-06-09 DIAGNOSIS — Z794 Long term (current) use of insulin: Secondary | ICD-10-CM

## 2022-06-09 DIAGNOSIS — M1990 Unspecified osteoarthritis, unspecified site: Secondary | ICD-10-CM | POA: Diagnosis not present

## 2022-06-09 DIAGNOSIS — I10 Essential (primary) hypertension: Secondary | ICD-10-CM | POA: Diagnosis not present

## 2022-06-09 DIAGNOSIS — M79672 Pain in left foot: Secondary | ICD-10-CM

## 2022-06-09 DIAGNOSIS — G8929 Other chronic pain: Secondary | ICD-10-CM

## 2022-06-09 DIAGNOSIS — E1142 Type 2 diabetes mellitus with diabetic polyneuropathy: Secondary | ICD-10-CM

## 2022-06-09 DIAGNOSIS — R350 Frequency of micturition: Secondary | ICD-10-CM

## 2022-06-09 DIAGNOSIS — M79671 Pain in right foot: Secondary | ICD-10-CM

## 2022-06-09 DIAGNOSIS — E119 Type 2 diabetes mellitus without complications: Secondary | ICD-10-CM

## 2022-06-09 DIAGNOSIS — M47812 Spondylosis without myelopathy or radiculopathy, cervical region: Secondary | ICD-10-CM | POA: Diagnosis not present

## 2022-06-09 LAB — POCT GLYCOSYLATED HEMOGLOBIN (HGB A1C): Hemoglobin A1C: 6.9 % — AB (ref 4.0–5.6)

## 2022-06-09 LAB — GLUCOSE, CAPILLARY: Glucose-Capillary: 168 mg/dL — ABNORMAL HIGH (ref 70–99)

## 2022-06-09 MED ORDER — TAMSULOSIN HCL 0.4 MG PO CAPS: 0.4000 mg | ORAL_CAPSULE | Freq: Every day | ORAL | 11 refills | Status: DC

## 2022-06-09 NOTE — Telephone Encounter (Signed)
PDMP reviewed and appropriate

## 2022-06-09 NOTE — Patient Instructions (Addendum)
Thank you, [name] for allowing [place] to provide your care today.    Diabetes: Continue taking synjardy. Your A1c was elevated to 6.9.  Pain in feet: I am referring you to podiatry to see if they can do steroid injections for your feel. The x-ray in 2021 was concerning for arthritis. If this does not help with pain than we may need to start some stronger pain medications. I would like to repeat xray and I will call with results.  Prostate: Please take tamsulosin 1 time daily. I think that your urine symptoms are related to prostate.  Blood pressure: Your blood pressure is well-controlled today.  I have ordered the following labs for you:  Lab Orders         Microalbumin / Creatinine Urine Ratio         ToxAssure Select,+Antidepr,UR         Glucose, capillary         POC Hbg A1C      Referrals ordered today:   Referral Orders  No referral(s) requested today      I have ordered the following medication/changed the following medications:   Stop the following medications: There are no discontinued medications.   Start the following medications: Meds ordered this encounter  Medications   tamsulosin (FLOMAX) 0.4 MG CAPS capsule    Sig: Take 1 capsule (0.4 mg total) by mouth daily.    Dispense:  30 capsule    Refill:  11     Follow up:  4 weeks    We look forward to seeing you next time. Please call our clinic at [phone] if you have any questions or concerns. The best time to call is Monday-Friday from 9am-4pm, but there is someone available 24/7. If after hours or the weekend, call the main hospital number and ask for the Internal Medicine Resident On-Call. If you need medication refills, please notify your pharmacy one week in advance and they will send [place] a request.   Thank you for trusting me with your care. Wishing you the best!   Jesus Martin, [place]

## 2022-06-10 ENCOUNTER — Encounter: Payer: Self-pay | Admitting: Internal Medicine

## 2022-06-10 DIAGNOSIS — R350 Frequency of micturition: Secondary | ICD-10-CM | POA: Insufficient documentation

## 2022-06-10 NOTE — Assessment & Plan Note (Signed)
She continues to have bilateral foot pain with standing, walking, and now sitting.  He has previously followed with podiatry and was fitted for orthotics which he wears at all times when walking.  He was treated for plantar fasciitis with injections with improvement in pain.  Now pain is present to metatarsals and feet bilaterally.  He describes the pain as a dull aching that worsens with weather changes. He did get x-rays in 2021 when he went to podiatry.  They appear to have joint narrowing of metatarsals right greater than left.  On exam he has focal tenderness over metatarsals of the feet bilaterally, no bruising, or edema noted.  Sensation is equal and feet bilaterally.  He does not have any calluses or ulcerations. His quality of life is greatly affected by the level of pain that he has. He no longer hikes and often has to skip activities with his children due to pain. Assessment: Symptoms are concerning for arthritis.  Plan: I would like to get xrays of feet bilaterally. If this demonstrates arthritis then would refer to ortho for joint injection.  He has been dealing with symptoms for several years and is frustrated. We discussed getting xrays and trying steroid injections. He is at that point that he would like to take stronger pain medications.

## 2022-06-10 NOTE — Assessment & Plan Note (Signed)
Assessment: Differentials include BPH.  The duration of symptoms is not consistent with UTI as he has had symptoms for several months.  Plan: Trial of tamsulosin 0.4 mg daily.  If no improvement could consider twice daily dosing.

## 2022-06-10 NOTE — Progress Notes (Signed)
Subjective:  CC: medication refill and diabetes management  HPI:  [name] is a 61 y.o. male with a past medical history stated below and presents today for follow-up on refill of lyrica and diabetes management. He also continues to have severe pain in his feet bilaterally. He has previously followed with podiatry for plantar fascciits and had inserts made as well as injections to heel with some improvement in pain. For the last several years he has had dull aching pain in middle of foot on the top. He has tried voltaren gel with no improvement in pain.   He is also having increased urinary frequency for the past several months.  He is having to go to the bathroom 2-3 times overnight and frequently throughout the day.  He denies difficulty with emptying bladder.  He denies having to strain to go to the bathroom.  Denies dysuria.  Please see problem based assessment and plan for additional details.  Past Medical History:  Diagnosis Date   Bipolar I disorder (HCC) 1991   Dr. Toy Care   Borderline hyperlipidemia    history of   COPD (chronic obstructive pulmonary disease) ([place])    Diabetes mellitus, new onset ([place]) 12/22/2017   Fibromyalgia    GERD (gastroesophageal reflux disease)    HTN (hypertension)    Lower back pain    Lumbar vertebral fracture (HCC) 01/29/2019   L1   Paranoia ([place])    Tobacco abuse    Vitamin D deficiency     Current Outpatient Medications on File Prior to Visit  Medication Sig Dispense Refill   Accu-Chek FastClix Lancets MISC Check blood sugar 3 times a day. 102 each 11   alprazolam (XANAX) 2 MG tablet Take 2 mg by mouth at bedtime as needed for sleep or anxiety.      amLODipine (NORVASC) 10 MG tablet Take 1 tablet by mouth once daily 90 tablet 2   atorvastatin (LIPITOR) 40 MG tablet Take 1 tablet (40 mg total) by mouth daily at 6 PM. 90 tablet 3   BD VEO INSULIN SYRINGE U/F 31G X 15/64" 0.3 ML MISC USE TWICE DAILY TO INJECT INSULIN 100 each 0    blood glucose meter kit and supplies KIT Dispense based on patient and insurance preference. Use up to four times daily as directed. (FOR ICD-9 250.00, 250.01). 1 each 0   Blood Glucose Monitoring Suppl (ACCU-CHEK GUIDE) w/Device KIT 1 each by Does not apply route 3 (three) times daily. 1 kit 1   Capsaicin 0.025 % PADS Apply 1 application topically 3 (three) times daily as needed. 20 each 0   capsicum (ZOSTRIX) 0.075 % topical cream Apply 1 application topically 3 (three) times daily as needed. 114 g 0   carvedilol (COREG) 12.5 MG tablet Take 1 tablet (12.5 mg total) by mouth 2 (two) times daily with a meal. 60 tablet 2   diclofenac sodium (VOLTAREN) 1 % GEL Apply 2 g topically 4 (four) times daily. 350 g 6   diphenhydrAMINE (BENADRYL) 25 mg capsule Take 25 mg by mouth every 8 (eight) hours as needed for allergies.     EPINEPHrine 0.3 mg/0.3 mL IJ SOAJ injection Inject 0.3 mg into the muscle once as needed (for anaphylactic reaction to bee stings).     glucose blood (ACCU-CHEK GUIDE) test strip USE 1  THREE TIMES DAILY TO CHECK BLOOD SUGAR 100 each 9   ipratropium-albuterol (DUONEB) 0.5-2.5 (3) MG/3ML SOLN Take 3 mLs by nebulization every 4 (four) hours  as needed. 360 mL 1   lamoTRIgine (LAMICTAL) 150 MG tablet Take 150 mg by mouth 2 (two) times daily.     lithium carbonate (ESKALITH) 450 MG CR tablet Take 450-675 mg by mouth See admin instructions. 675 mg by mouth in the morning and 450 mg at bedtime     losartan-hydrochlorothiazide (HYZAAR) 100-25 MG tablet Take 1 tablet by mouth once daily 90 tablet 3   methocarbamol (ROBAXIN) 500 MG tablet Take by mouth.     multivitamin (ONE-A-DAY MEN'S) TABS tablet Take 1 tablet by mouth daily.     omeprazole (PRILOSEC) 40 MG capsule Take 1 capsule by mouth once daily 90 capsule 2   PFIZER COVID-19 VAC BIVALENT injection      QUEtiapine (SEROQUEL) 50 MG tablet Take 225 mg by mouth at bedtime.     SYNJARDY 12-998 MG TABS Take 1 tablet by mouth twice daily  90 tablet 3   TRELEGY ELLIPTA 100-62.5-25 MCG/ACT AEPB INHALE 1 PUFF ONCE DAILY 60 each 3   VENTOLIN HFA 108 (90 Base) MCG/ACT inhaler INHALE 2 PUFFS BY MOUTH EVERY 6 HOURS AS NEEDED FOR WHEEZING OR SHORTNESS OF BREATH 18 g 3   No current facility-administered medications on file prior to visit.    Family History  Adopted: Yes    Social History   Socioeconomic History   Marital status: Married    Spouse name: Not on file   Number of children: Not on file   Years of education: Not on file   Highest education level: Not on file  Occupational History   Not on file  Tobacco Use   Smoking status: Every Day    Packs/day: 0.75    Years: 45.00    Total pack years: 33.75    Types: Cigarettes   Smokeless tobacco: Never   Tobacco comments:    3/4 pack smoked daily ARJ 12/09/21  Vaping Use   Vaping Use: Never used  Substance and Sexual Activity   Alcohol use: Yes    Comment: Beer sometimes.   Drug use: No   Sexual activity: Not on file  Other Topics Concern   Not on file  Social History Narrative   Lives with wife Eliyas Suddreth (married 48 years) and son. Has 4 dogs and fish. Not employed. Woodworking for fun. HS graduate.       Quit smoking in 2008. 26 pack years. No recreational drug use. Drinks 1 glass of wine per month. Does not exercise.    Social Determinants of Health   Financial Resource Strain: Low Risk  (02/03/2022)   Overall Financial Resource Strain (CARDIA)    Difficulty of Paying Living Expenses: Not hard at all  Food Insecurity: No Food Insecurity (02/03/2022)   Hunger Vital Sign    Worried About Running Out of Food in the Last Year: Never true    Ran Out of Food in the Last Year: Never true  Transportation Needs: No Transportation Needs (02/03/2022)   PRAPARE - Hydrologist (Medical): No    Lack of Transportation (Non-Medical): No  Physical Activity: Inactive (02/03/2022)   Exercise Vital Sign    Days of Exercise per Week: 0 days     Minutes of Exercise per Session: 0 min  Stress: Stress Concern Present (02/03/2022)   [place]    Feeling of Stress : To some extent  Social Connections: Moderately Isolated (02/03/2022)   Social Connection and Isolation Panel [NHANES]  Frequency of Communication with Friends and Family: Never    Frequency of Social Gatherings with Friends and Family: Once a week    Attends Religious Services: 1 to 4 times per year    Active Member of [employer] or Organizations: No    Attends Archivist Meetings: Never    Marital Status: Married  Human resources officer Violence: Not At Risk (02/03/2022)   Humiliation, Afraid, Rape, and Kick questionnaire    Fear of Current or Ex-Partner: No    Emotionally Abused: No    Physically Abused: No    Sexually Abused: No    Review of Systems: ROS negative except for what is noted on the assessment and plan.  Objective:   Vitals:   06/09/22 1002  BP: 129/79  Pulse: 80  Resp: (!) 28  Temp: (!) 97.5 F (36.4 C)  TempSrc: Oral  SpO2: 97%  Weight: 240 lb 8 oz (109.1 kg)  Height: 6' (1.829 m)    Physical Exam: Constitutional: well-appearing  Cardiovascular: regular rate and rhythm, no m/r/g Pulmonary/Chest: normal work of breathing on room air, lungs clear to auscultation bilaterally Abdominal: soft, non-tender, non-distended MSK: focal tenderness to metatarsals of feet bilaterally, no effusion, ecchymosis or other skin changes noted Skin: warm and dry   Assessment & Plan:  Foot pain, bilateral She continues to have bilateral foot pain with standing, walking, and now sitting.  He has previously followed with podiatry and was fitted for orthotics which he wears at all times when walking.  He was treated for plantar fasciitis with injections with improvement in pain.  Now pain is present to metatarsals and feet bilaterally.  He describes the pain as a dull aching that worsens with  weather changes. He did get x-rays in 2021 when he went to podiatry.  They appear to have joint narrowing of metatarsals right greater than left.  On exam he has focal tenderness over metatarsals of the feet bilaterally, no bruising, or edema noted.  Sensation is equal and feet bilaterally.  He does not have any calluses or ulcerations. His quality of life is greatly affected by the level of pain that he has. He no longer hikes and often has to skip activities with his children due to pain. Assessment: Symptoms are concerning for arthritis.  Plan: I would like to get xrays of feet bilaterally. If this demonstrates arthritis then would refer to ortho for joint injection.  He has been dealing with symptoms for several years and is frustrated. We discussed getting xrays and trying steroid injections. He is at that point that he would like to take stronger pain medications.  Urinary frequency Assessment: Differentials include BPH.  The duration of symptoms is not consistent with UTI as he has had symptoms for several months.  Plan: Trial of tamsulosin 0.4 mg daily.  If no improvement could consider twice daily dosing.  Hypertension Blood pressure is well controlled at 129/79.  Medications include amlodipine 10 mg, losartan-HCTZ 100-25 mg, carvedilol 12.5 mg twice daily.  He is concerned that hydrochlorothiazide is causing urinary frequency.  He has been on this medication for longer than he has had difficulty with urinary frequency.  I have low suspicion that this completely explains increased urinary frequency although it could worsen problem. Plan: Continue current medications: amlodipine 10 mg, losartan-HCTZ 100-25 mg, carvedilol 12.5 mg twice daily.    Patient discussed with Dr. [facility] General Areon Cocuzza, D.O. [facility]  PGY-2 Pager: [phone]  Phone: [phone] Date 06/10/2022  Time 5:48 PM

## 2022-06-10 NOTE — Assessment & Plan Note (Addendum)
Blood pressure is well controlled at 129/79.  Medications include amlodipine 10 mg, losartan-HCTZ 100-25 mg, carvedilol 12.5 mg twice daily.  He is concerned that hydrochlorothiazide is causing urinary frequency.  He has been on this medication for longer than he has had difficulty with urinary frequency.  I have low suspicion that this completely explains increased urinary frequency although it could worsen problem. Plan: Continue current medications: amlodipine 10 mg, losartan-HCTZ 100-25 mg, carvedilol 12.5 mg twice daily.

## 2022-06-11 NOTE — Progress Notes (Signed)
Internal Medicine Clinic Attending  Case discussed with Dr. Masters  At the time of the visit.  We reviewed the resident's history and exam and pertinent patient test results.  I agree with the assessment, diagnosis, and plan of care documented in the resident's note.  

## 2022-06-20 ENCOUNTER — Other Ambulatory Visit: Payer: Self-pay | Admitting: Student

## 2022-06-20 DIAGNOSIS — E7800 Pure hypercholesterolemia, unspecified: Secondary | ICD-10-CM

## 2022-07-06 ENCOUNTER — Other Ambulatory Visit: Payer: Self-pay | Admitting: Internal Medicine

## 2022-07-06 DIAGNOSIS — I10 Essential (primary) hypertension: Secondary | ICD-10-CM

## 2022-07-12 ENCOUNTER — Other Ambulatory Visit: Payer: Self-pay | Admitting: Internal Medicine

## 2022-07-12 DIAGNOSIS — E1142 Type 2 diabetes mellitus with diabetic polyneuropathy: Secondary | ICD-10-CM

## 2022-07-12 DIAGNOSIS — E119 Type 2 diabetes mellitus without complications: Secondary | ICD-10-CM

## 2022-07-12 DIAGNOSIS — G8929 Other chronic pain: Secondary | ICD-10-CM

## 2022-07-13 NOTE — Telephone Encounter (Signed)
PDMP reviewed and appropriate Last UDS was 7/23 and appropriate at that time.

## 2022-07-29 ENCOUNTER — Other Ambulatory Visit: Payer: Self-pay | Admitting: Internal Medicine

## 2022-07-29 DIAGNOSIS — I10 Essential (primary) hypertension: Secondary | ICD-10-CM

## 2022-07-30 ENCOUNTER — Other Ambulatory Visit: Payer: Self-pay | Admitting: Internal Medicine

## 2022-08-10 ENCOUNTER — Other Ambulatory Visit: Payer: Self-pay | Admitting: Internal Medicine

## 2022-08-10 DIAGNOSIS — G8929 Other chronic pain: Secondary | ICD-10-CM

## 2022-08-10 DIAGNOSIS — E1142 Type 2 diabetes mellitus with diabetic polyneuropathy: Secondary | ICD-10-CM

## 2022-08-10 DIAGNOSIS — E119 Type 2 diabetes mellitus without complications: Secondary | ICD-10-CM

## 2022-08-12 ENCOUNTER — Ambulatory Visit (INDEPENDENT_AMBULATORY_CARE_PROVIDER_SITE_OTHER): Payer: Medicare HMO | Admitting: Internal Medicine

## 2022-08-12 ENCOUNTER — Encounter: Payer: Self-pay | Admitting: Internal Medicine

## 2022-08-12 ENCOUNTER — Other Ambulatory Visit: Payer: Self-pay

## 2022-08-12 VITALS — BP 127/77 | HR 86 | Temp 97.8°F | Resp 32 | Ht 72.0 in | Wt 228.2 lb

## 2022-08-12 DIAGNOSIS — R21 Rash and other nonspecific skin eruption: Secondary | ICD-10-CM | POA: Diagnosis not present

## 2022-08-12 DIAGNOSIS — B309 Viral conjunctivitis, unspecified: Secondary | ICD-10-CM | POA: Diagnosis not present

## 2022-08-12 DIAGNOSIS — L282 Other prurigo: Secondary | ICD-10-CM

## 2022-08-12 DIAGNOSIS — H1030 Unspecified acute conjunctivitis, unspecified eye: Secondary | ICD-10-CM | POA: Diagnosis not present

## 2022-08-12 MED ORDER — DOXYCYCLINE HYCLATE 100 MG PO CAPS: 100.0000 mg | ORAL_CAPSULE | Freq: Two times a day (BID) | ORAL | 0 refills | Status: AC

## 2022-08-12 MED ORDER — HYPROMELLOSE (GONIOSCOPIC) 2.5 % OP SOLN: 1.0000 [drp] | Freq: Four times a day (QID) | OPHTHALMIC | 0 refills | Status: AC

## 2022-08-12 MED ORDER — HYDROCORTISONE 1 % EX OINT: 1.0000 | TOPICAL_OINTMENT | Freq: Two times a day (BID) | CUTANEOUS | 0 refills | Status: DC

## 2022-08-12 NOTE — Progress Notes (Addendum)
CC: rash  HPI:  [name] is a 62 y.o. with medical history of HTN, DMII, Bipolar disorder, COPD, subclinical hyperthyroidism presenting to [facility] for rash.   Please see problem-based list for further details, assessments, and plans.  Past Medical History:  Diagnosis Date   Bipolar I disorder [facility]) 1991   Dr. Toy Care   Borderline hyperlipidemia    history of   COPD (chronic obstructive pulmonary disease) ([place])    Diabetes mellitus, new onset ([place]) 12/22/2017   Fibromyalgia    GERD (gastroesophageal reflux disease)    HTN (hypertension)    Lower back pain    Lumbar vertebral fracture (HCC) 01/29/2019   L1   Paranoia (HCC)    Tobacco abuse    Vitamin D deficiency     Current Outpatient Medications (Endocrine & Metabolic):    SYNJARDY 12-998 MG TABS, Take 1 tablet by mouth twice daily  Current Outpatient Medications (Cardiovascular):    amLODipine (NORVASC) 10 MG tablet, Take 1 tablet by mouth once daily   atorvastatin (LIPITOR) 40 MG tablet, TAKE 1 TABLET BY MOUTH ONCE DAILY AT  6PM   carvedilol (COREG) 12.5 MG tablet, TAKE 1 TABLET BY MOUTH TWICE DAILY WITH A MEAL   EPINEPHrine 0.3 mg/0.3 mL IJ SOAJ injection, Inject 0.3 mg into the muscle once as needed (for anaphylactic reaction to bee stings).   losartan-hydrochlorothiazide (HYZAAR) 100-25 MG tablet, Take 1 tablet by mouth once daily  Current Outpatient Medications (Respiratory):    diphenhydrAMINE (BENADRYL) 25 mg capsule, Take 25 mg by mouth every 8 (eight) hours as needed for allergies.   Fluticasone-Umeclidin-Vilant (TRELEGY ELLIPTA) 100-62.5-25 MCG/ACT AEPB, INHALE 1 PUFF ONCE DAILY   ipratropium-albuterol (DUONEB) 0.5-2.5 (3) MG/3ML SOLN, Take 3 mLs by nebulization every 4 (four) hours as needed.   VENTOLIN HFA 108 (90 Base) MCG/ACT inhaler, INHALE 2 PUFFS BY MOUTH EVERY 6 HOURS AS NEEDED FOR WHEEZING OR SHORTNESS OF BREATH  Current Outpatient Medications (Analgesics):    traMADol (ULTRAM) 50 MG tablet,  Take 1 tablet (50 mg total) by mouth 2 (two) times daily as needed.   Current Outpatient Medications (Other):    doxycycline (VIBRAMYCIN) 100 MG capsule, Take 1 capsule (100 mg total) by mouth 2 (two) times daily for 7 days.   hydrocortisone 1 % ointment, Apply 1 Application topically 2 (two) times daily.   hydroxypropyl methylcellulose / hypromellose (ISOPTO TEARS / GONIOVISC) 2.5 % ophthalmic solution, Place 1 drop into both eyes 4 (four) times daily for 7 days.   Accu-Chek FastClix Lancets MISC, Check blood sugar 3 times a day.   alprazolam (XANAX) 2 MG tablet, Take 2 mg by mouth at bedtime as needed for sleep or anxiety.    BD VEO INSULIN SYRINGE U/F 31G X 15/64" 0.3 ML MISC, USE TWICE DAILY TO INJECT INSULIN   blood glucose meter kit and supplies KIT, Dispense based on patient and insurance preference. Use up to four times daily as directed. (FOR ICD-9 250.00, 250.01).   Blood Glucose Monitoring Suppl (ACCU-CHEK GUIDE) w/Device KIT, 1 each by Does not apply route 3 (three) times daily.   Capsaicin 0.025 % PADS, Apply 1 application topically 3 (three) times daily as needed.   capsicum (ZOSTRIX) 0.075 % topical cream, Apply 1 application topically 3 (three) times daily as needed.   diclofenac sodium (VOLTAREN) 1 % GEL, Apply 2 g topically 4 (four) times daily.   glucose blood (ACCU-CHEK GUIDE) test strip, USE 1  THREE TIMES DAILY TO CHECK BLOOD SUGAR  lamoTRIgine (LAMICTAL) 150 MG tablet, Take 150 mg by mouth 2 (two) times daily.   lithium carbonate (ESKALITH) 450 MG CR tablet, Take 450-675 mg by mouth See admin instructions. 675 mg by mouth in the morning and 450 mg at bedtime   methocarbamol (ROBAXIN) 500 MG tablet, Take by mouth.   multivitamin (ONE-A-DAY MEN'S) TABS tablet, Take 1 tablet by mouth daily.   omeprazole (PRILOSEC) 40 MG capsule, Take 1 capsule by mouth once daily   PFIZER COVID-19 VAC BIVALENT injection,    pregabalin (LYRICA) 150 MG capsule, Take 1 capsule (150 mg total)  by mouth 2 (two) times daily.   QUEtiapine (SEROQUEL) 50 MG tablet, Take 225 mg by mouth at bedtime.   tamsulosin (FLOMAX) 0.4 MG CAPS capsule, Take 1 capsule (0.4 mg total) by mouth daily.  Review of Systems:  Review of system negative unless stated in the problem list or HPI.    Physical Exam:  Vitals:   08/12/22 0950  BP: 127/77  Pulse: 86  Resp: (!) 32  Temp: 97.8 F (36.6 C)  TempSrc: Oral  Weight: 228 lb 3.2 oz (103.5 kg)  Height: 6' (1.829 m)    Physical Exam General: NAD HENT: bilateral conjuctivitis Lungs: CTAB, no wheeze, rhonchi or rales.  Cardiovascular: Normal heart sounds, no r/m/g, 2+ pulses in all extremities. No LE edema Abdomen: No TTP, normal bowel sounds MSK: No asymmetry or muscle atrophy.  Skin: Rash present on face, back of head, and right wrist at varying stages with some having crusting while others are not. Lesion at the back of the head and around the ear with some oozing present.  Neuro: Alert and oriented x4. CN grossly intact Psych: Normal mood and normal affect   Assessment & Plan:   Rash and nonspecific skin eruption Patient with complaint of rash that started at the back of the head in 2 weeks. With a bump that grew larger and the spread to face, lips. He states he has no fevers, chills. He states the lesions are painful. His eyes turned red last night and now they are painful. No recent changes in skin routine and wife states they switched from tide to arm and hammer detergent one month ago. Pt took a trip 2 weeks to see her grand-daughter but denies any exposure to wooded areas or sick contacts. On hx, pt denied any previous rashes, but it appears he developed a similar rash few years prior. Ddx included allergic rash vs herpes. Herpes was on the differential given the painful nature of the lesions (see media tab). But it appears the lesions were initially itchy and with excortication became painful. Some lesions with crusting consistent with  bacterial super infection. Will start pt on doxycycline and hydrocortisone for the non-infected lesions. For his eyes, will give him eye drops. Will follow up in one week to see if this has resolved. Will place a dermatology referral in case pt's rash does not clear up and but may cancel this on the follow up visit if it has resolved. Obtained labwork that is notable for borderline leukocytosis and elevated monocytes and eosinophils.  -Follow up in one week.   Viral conjunctivitis of both eyes Overall presentation consistent with viral conjunctivitis.  Prescribed tear eye drops for supportive care as it should begin to clear over the next few days.  -Monitor at follow up.    See Encounters Tab for problem based charting.  Patient discussed with Dr. Oren Binet, MD Tillie Rung. [facility]  Hospital Internal Medicine Residency, PGY-2

## 2022-08-12 NOTE — Patient Instructions (Addendum)
[name], it was a pleasure seeing you today! You endorsed feeling well today. Below are some of the things we talked about this visit. We look forward to seeing you in the follow up appointment!  Today we discussed: For your rash, I am giving you an antibiotic. Please take this every day. Use the steroids on arms and back of neck only. I am also given you eye drops to put in your eyes.  I will place a referral to your skin doctor but I want to see you back next week to see how you are doing.   I have ordered the following labs today:  Lab Orders         CBC with Diff         CMP14 + Anion Gap        Referrals ordered today:   Referral Orders         Ambulatory referral to Dermatology       I have ordered the following medication/changed the following medications:   Stop the following medications: There are no discontinued medications.   Start the following medications: Meds ordered this encounter  Medications   doxycycline (VIBRAMYCIN) 100 MG capsule    Sig: Take 1 capsule (100 mg total) by mouth 2 (two) times daily for 7 days.    Dispense:  14 capsule    Refill:  0   hydroxypropyl methylcellulose / hypromellose (ISOPTO TEARS / GONIOVISC) 2.5 % ophthalmic solution    Sig: Place 1 drop into both eyes 4 (four) times daily for 7 days.    Dispense:  15 mL    Refill:  0   hydrocortisone 1 % ointment    Sig: Apply 1 Application topically 2 (two) times daily.    Dispense:  30 g    Refill:  0     Follow-up: Monday with Dr. Humphrey Rolls  Please make sure to arrive 15 minutes prior to your next appointment. If you arrive late, you may be asked to reschedule.   We look forward to seeing you next time. Please call our clinic at [phone] if you have any questions or concerns. The best time to call is Monday-Friday from 9am-4pm, but there is someone available 24/7. If after hours or the weekend, call the main hospital number and ask for the Internal Medicine Resident On-Call.  If you need medication refills, please notify your pharmacy one week in advance and they will send [place] a request.  Thank you for letting us take part in your care. Wishing you the best!  Thank you, Idamae Schuller, MD

## 2022-08-13 ENCOUNTER — Other Ambulatory Visit: Payer: Self-pay | Admitting: Internal Medicine

## 2022-08-13 DIAGNOSIS — E119 Type 2 diabetes mellitus without complications: Secondary | ICD-10-CM

## 2022-08-13 DIAGNOSIS — E1142 Type 2 diabetes mellitus with diabetic polyneuropathy: Secondary | ICD-10-CM

## 2022-08-13 DIAGNOSIS — G8929 Other chronic pain: Secondary | ICD-10-CM

## 2022-08-13 LAB — CBC WITH DIFFERENTIAL/PLATELET
Basophils Absolute: 0.1 10*3/uL (ref 0.0–0.2)
Basos: 1 %
EOS (ABSOLUTE): 0.6 10*3/uL — ABNORMAL HIGH (ref 0.0–0.4)
Eos: 5 %
Hematocrit: 42.1 % (ref 37.5–51.0)
Hemoglobin: 14.3 g/dL (ref 13.0–17.7)
Immature Grans (Abs): 0 10*3/uL (ref 0.0–0.1)
Immature Granulocytes: 0 %
Lymphocytes Absolute: 2.9 10*3/uL (ref 0.7–3.1)
Lymphs: 27 %
MCH: 30.9 pg (ref 26.6–33.0)
MCHC: 34 g/dL (ref 31.5–35.7)
MCV: 91 fL (ref 79–97)
Monocytes Absolute: 1 10*3/uL — ABNORMAL HIGH (ref 0.1–0.9)
Monocytes: 9 %
Neutrophils Absolute: 6.3 10*3/uL (ref 1.4–7.0)
Neutrophils: 58 %
Platelets: 273 10*3/uL (ref 150–450)
RBC: 4.63 x10E6/uL (ref 4.14–5.80)
RDW: 11.8 % (ref 11.6–15.4)
WBC: 10.8 10*3/uL (ref 3.4–10.8)

## 2022-08-13 LAB — CMP14 + ANION GAP
ALT: 24 IU/L (ref 0–44)
AST: 18 IU/L (ref 0–40)
Albumin/Globulin Ratio: 2 (ref 1.2–2.2)
Albumin: 4.9 g/dL (ref 3.8–4.9)
Alkaline Phosphatase: 92 IU/L (ref 44–121)
Anion Gap: 17 mmol/L (ref 10.0–18.0)
BUN/Creatinine Ratio: 15 (ref 10–24)
BUN: 16 mg/dL (ref 8–27)
Bilirubin Total: 0.4 mg/dL (ref 0.0–1.2)
CO2: 22 mmol/L (ref 20–29)
Calcium: 10.7 mg/dL — ABNORMAL HIGH (ref 8.6–10.2)
Chloride: 101 mmol/L (ref 96–106)
Creatinine, Ser: 1.07 mg/dL (ref 0.76–1.27)
Globulin, Total: 2.4 g/dL (ref 1.5–4.5)
Glucose: 128 mg/dL — ABNORMAL HIGH (ref 70–99)
Potassium: 4.4 mmol/L (ref 3.5–5.2)
Sodium: 140 mmol/L (ref 134–144)
Total Protein: 7.3 g/dL (ref 6.0–8.5)
eGFR: 79 mL/min/{1.73_m2} (ref >59)

## 2022-08-13 MED ORDER — PREGABALIN 150 MG PO CAPS: 150.0000 mg | ORAL_CAPSULE | Freq: Two times a day (BID) | ORAL | 0 refills | Status: DC

## 2022-08-13 MED ORDER — TRAMADOL HCL 50 MG PO TABS: 50.0000 mg | ORAL_TABLET | Freq: Two times a day (BID) | ORAL | 0 refills | Status: DC | PRN

## 2022-08-13 NOTE — Telephone Encounter (Signed)
Pt's wife calling. Pt seen yesterday 08/12/2022 by Dr. Humphrey Rolls and was unable to get get the following meds pick up at the pharmacy.  The pharmacy asked that hey contact their Dr.'s office.   pregabalin (LYRICA) 150 MG capsule   traMADol (ULTRAM) 50 MG tablet

## 2022-08-13 NOTE — Telephone Encounter (Signed)
PDMP reviewed and appropriate. He will need repeat UDS at follow-up. Last UDS 7/23 appropriate.

## 2022-08-14 DIAGNOSIS — B309 Viral conjunctivitis, unspecified: Secondary | ICD-10-CM | POA: Insufficient documentation

## 2022-08-14 HISTORY — DX: Viral conjunctivitis, unspecified: B30.9

## 2022-08-14 NOTE — Assessment & Plan Note (Addendum)
Patient with complaint of rash that started at the back of the head in 2 weeks. With a bump that grew larger and the spread to face, lips. He states he has no fevers, chills. He states the lesions are painful. His eyes turned red last night and now they are painful. No recent changes in skin routine and wife states they switched from tide to arm and hammer detergent one month ago. Pt took a trip 2 weeks to see her grand-daughter but denies any exposure to wooded areas or sick contacts. On hx, pt denied any previous rashes, but it appears he developed a similar rash few years prior. Ddx included allergic rash vs herpes. Herpes was on the differential given the painful nature of the lesions (see media tab). But it appears the lesions were initially itchy and with excortication became painful. Some lesions with crusting consistent with bacterial super infection. Will start pt on doxycycline and hydrocortisone for the non-infected lesions. For his eyes, will give him eye drops. Will follow up in one week to see if this has resolved. Will place a dermatology referral in case pt's rash does not clear up and but may cancel this on the follow up visit if it has resolved. Obtained labwork that is notable for borderline leukocytosis and elevated monocytes and eosinophils.  -Follow up in one week.

## 2022-08-14 NOTE — Assessment & Plan Note (Signed)
Overall presentation consistent with viral conjunctivitis.  Prescribed tear eye drops for supportive care as it should begin to clear over the next few days.  -Monitor at follow up.

## 2022-08-17 ENCOUNTER — Ambulatory Visit (INDEPENDENT_AMBULATORY_CARE_PROVIDER_SITE_OTHER): Payer: Medicare HMO | Admitting: Internal Medicine

## 2022-08-17 VITALS — BP 112/65 | HR 86 | Temp 97.6°F | Wt 231.1 lb

## 2022-08-17 DIAGNOSIS — R0981 Nasal congestion: Secondary | ICD-10-CM | POA: Diagnosis not present

## 2022-08-17 DIAGNOSIS — L282 Other prurigo: Secondary | ICD-10-CM | POA: Diagnosis not present

## 2022-08-17 DIAGNOSIS — R21 Rash and other nonspecific skin eruption: Secondary | ICD-10-CM

## 2022-08-17 MED ORDER — FLUTICASONE PROPIONATE 50 MCG/ACT NA SUSP: 2.0000 | Freq: Every day | NASAL | 0 refills | Status: DC

## 2022-08-17 MED ORDER — HYDROCORTISONE 1 % EX OINT: 1.0000 | TOPICAL_OINTMENT | Freq: Two times a day (BID) | CUTANEOUS | 2 refills | Status: DC

## 2022-08-17 NOTE — Addendum Note (Signed)
Addended by: Idamae Schuller on: 08/17/2022 02:43 PM   Modules accepted: Orders

## 2022-08-17 NOTE — Progress Notes (Signed)
CC: rash follow up  HPI:  [name] is a 62 y.o. with medical history of HTN, DMII, Bipolar disorder, COPD, subclinical hyperthyroidism presenting to [facility] for a rash follow up.   Please see problem-based list for further details, assessments, and plans.  Past Medical History:  Diagnosis Date   Bipolar I disorder [facility]) 1991   Dr. Toy Care   Borderline hyperlipidemia    history of   COPD (chronic obstructive pulmonary disease) ([place])    Diabetes mellitus, new onset ([place]) 12/22/2017   Fibromyalgia    GERD (gastroesophageal reflux disease)    HTN (hypertension)    Lower back pain    Lumbar vertebral fracture (HCC) 01/29/2019   L1   Paranoia (HCC)    Tobacco abuse    Vitamin D deficiency     Current Outpatient Medications (Endocrine & Metabolic):    SYNJARDY 12-998 MG TABS, Take 1 tablet by mouth twice daily  Current Outpatient Medications (Cardiovascular):    amLODipine (NORVASC) 10 MG tablet, Take 1 tablet by mouth once daily   atorvastatin (LIPITOR) 40 MG tablet, TAKE 1 TABLET BY MOUTH ONCE DAILY AT  6PM   carvedilol (COREG) 12.5 MG tablet, TAKE 1 TABLET BY MOUTH TWICE DAILY WITH A MEAL   EPINEPHrine 0.3 mg/0.3 mL IJ SOAJ injection, Inject 0.3 mg into the muscle once as needed (for anaphylactic reaction to bee stings).   losartan-hydrochlorothiazide (HYZAAR) 100-25 MG tablet, Take 1 tablet by mouth once daily  Current Outpatient Medications (Respiratory):    fluticasone (FLONASE) 50 MCG/ACT nasal spray, Place 2 sprays into both nostrils daily.   diphenhydrAMINE (BENADRYL) 25 mg capsule, Take 25 mg by mouth every 8 (eight) hours as needed for allergies.   Fluticasone-Umeclidin-Vilant (TRELEGY ELLIPTA) 100-62.5-25 MCG/ACT AEPB, INHALE 1 PUFF ONCE DAILY   ipratropium-albuterol (DUONEB) 0.5-2.5 (3) MG/3ML SOLN, Take 3 mLs by nebulization every 4 (four) hours as needed.   VENTOLIN HFA 108 (90 Base) MCG/ACT inhaler, INHALE 2 PUFFS BY MOUTH EVERY 6 HOURS AS NEEDED FOR  WHEEZING OR SHORTNESS OF BREATH  Current Outpatient Medications (Analgesics):    traMADol (ULTRAM) 50 MG tablet, Take 1 tablet (50 mg total) by mouth 2 (two) times daily as needed.   Current Outpatient Medications (Other):    Accu-Chek FastClix Lancets MISC, Check blood sugar 3 times a day.   alprazolam (XANAX) 2 MG tablet, Take 2 mg by mouth at bedtime as needed for sleep or anxiety.    BD VEO INSULIN SYRINGE U/F 31G X 15/64" 0.3 ML MISC, USE TWICE DAILY TO INJECT INSULIN   blood glucose meter kit and supplies KIT, Dispense based on patient and insurance preference. Use up to four times daily as directed. (FOR ICD-9 250.00, 250.01).   Blood Glucose Monitoring Suppl (ACCU-CHEK GUIDE) w/Device KIT, 1 each by Does not apply route 3 (three) times daily.   Capsaicin 0.025 % PADS, Apply 1 application topically 3 (three) times daily as needed.   capsicum (ZOSTRIX) 0.075 % topical cream, Apply 1 application topically 3 (three) times daily as needed.   diclofenac sodium (VOLTAREN) 1 % GEL, Apply 2 g topically 4 (four) times daily.   doxycycline (VIBRAMYCIN) 100 MG capsule, Take 1 capsule (100 mg total) by mouth 2 (two) times daily for 7 days.   glucose blood (ACCU-CHEK GUIDE) test strip, USE 1  THREE TIMES DAILY TO CHECK BLOOD SUGAR   hydrocortisone 1 % ointment, Apply 1 Application topically 2 (two) times daily.   hydroxypropyl methylcellulose / hypromellose (ISOPTO TEARS /  GONIOVISC) 2.5 % ophthalmic solution, Place 1 drop into both eyes 4 (four) times daily for 7 days.   lamoTRIgine (LAMICTAL) 150 MG tablet, Take 150 mg by mouth 2 (two) times daily.   lithium carbonate (ESKALITH) 450 MG CR tablet, Take 450-675 mg by mouth See admin instructions. 675 mg by mouth in the morning and 450 mg at bedtime   methocarbamol (ROBAXIN) 500 MG tablet, Take by mouth.   multivitamin (ONE-A-DAY MEN'S) TABS tablet, Take 1 tablet by mouth daily.   omeprazole (PRILOSEC) 40 MG capsule, Take 1 capsule by mouth once  daily   PFIZER COVID-19 VAC BIVALENT injection,    pregabalin (LYRICA) 150 MG capsule, Take 1 capsule (150 mg total) by mouth 2 (two) times daily.   QUEtiapine (SEROQUEL) 50 MG tablet, Take 225 mg by mouth at bedtime.   tamsulosin (FLOMAX) 0.4 MG CAPS capsule, Take 1 capsule (0.4 mg total) by mouth daily.  Review of Systems:  Review of system negative unless stated in the problem list or HPI.    Physical Exam:  Vitals:   08/17/22 1000  BP: 112/65  Pulse: 86  Temp: 97.6 F (36.4 C)  TempSrc: Oral  SpO2: 98%  Weight: 231 lb 1.6 oz (104.8 kg)    Physical Exam General: NAD HENT: Rash improved. Left ear with bulging TM. Tragus TTP to touch.  Lungs: CTAB, no wheeze, rhonchi or rales.  Cardiovascular: Normal heart sounds, no r/m/g, 2+ pulses in all extremities. No LE edema Abdomen: No TTP, normal bowel sounds MSK: No asymmetry or muscle atrophy.  Skin: rash improved from prior (see media tab) Neuro: Alert and oriented x4. CN grossly intact Psych: Normal mood and normal affect   Assessment & Plan:   Rash and nonspecific skin eruption Rash improved with previous interventions including doxycycline 100 mg BID, steroid cream. Crusted lesions have cleared. Eye conjunctivitis has resolved. Pt states he is having ear pain. Exam consistent with otitis media. Pt on doxycyline which should cover the infection. Will start pt on flonase given he is reporting watery eyes and this may help with otitis media. Return precautions given. Will cancel dermatology referral given pt's rash is improved.  -Continue Doxycyline 100 mg BID, Steroid cream and start flonase nasal spray   See Encounters Tab for problem based charting.  Patient discussed with Dr. Karrie Meres, MD Eligha Bridegroom. [facility] Internal Medicine Residency, PGY-2

## 2022-08-17 NOTE — Assessment & Plan Note (Signed)
Rash improved with previous interventions including doxycycline 100 mg BID, steroid cream. Crusted lesions have cleared. Eye conjunctivitis has resolved. Pt states he is having ear pain. Exam consistent with otitis media. Pt on doxycyline which should cover the infection. Will start pt on flonase given he is reporting watery eyes and this may help with otitis media. Return precautions given. Will cancel dermatology referral given pt's rash is improved.  -Continue Doxycyline 100 mg BID, Steroid cream and start flonase nasal spray

## 2022-08-17 NOTE — Patient Instructions (Addendum)
[name], it was a pleasure seeing you today! You endorsed feeling well today. Below are some of the things we talked about this visit. We look forward to seeing you in the follow up appointment!  Today we discussed: You presented for a follow up of a rash. It appears your rash is improving. Continue your antibiotics and complete the course. Continue using the steroid cream. I will give you a nasal spray for your nose. The ear pain will get better with the antibiotics. You can take over the counter tylenol and ibuprofen with the limits stated on the bottle.   I would like to see you in one month for a regular follow up but come back if your symptoms have not resolved by next week.   I have ordered the following labs today:  Lab Orders  No laboratory test(s) ordered today      Referrals ordered today:   Referral Orders  No referral(s) requested today     I have ordered the following medication/changed the following medications:   Stop the following medications: Medications Discontinued During This Encounter  Medication Reason   hydrocortisone 1 % ointment Reorder     Start the following medications: Meds ordered this encounter  Medications   fluticasone (FLONASE) 50 MCG/ACT nasal spray    Sig: Place 2 sprays into both nostrils daily.    Dispense:  16 g    Refill:  0   hydrocortisone 1 % ointment    Sig: Apply 1 Application topically 2 (two) times daily.    Dispense:  30 g    Refill:  2     Follow-up: 1 month follow up or earlier if needed   Please make sure to arrive 15 minutes prior to your next appointment. If you arrive late, you may be asked to reschedule.   We look forward to seeing you next time. Please call our clinic at [phone] if you have any questions or concerns. The best time to call is Monday-Friday from 9am-4pm, but there is someone available 24/7. If after hours or the weekend, call the main hospital number and ask for the Internal Medicine  Resident On-Call. If you need medication refills, please notify your pharmacy one week in advance and they will send [place] a request.  Thank you for letting us take part in your care. Wishing you the best!  Thank you, Idamae Schuller, MD

## 2022-08-20 NOTE — Progress Notes (Signed)
Internal Medicine Clinic Attending  Case discussed with the resident at the time of the visit.  We reviewed the resident's history and exam and pertinent patient test results.  I agree with the assessment, diagnosis, and plan of care documented in the resident's note.  

## 2022-08-26 NOTE — Progress Notes (Signed)
Internal Medicine Clinic Attending  I saw and evaluated the patient.  I personally confirmed the key portions of the history and exam documented by Dr. Khan and I reviewed pertinent patient test results.  The assessment, diagnosis, and plan were formulated together and I agree with the documentation in the resident's note.  

## 2022-08-26 NOTE — Addendum Note (Signed)
Addended by: Aldine Contes on: 08/26/2022 09:48 PM   Modules accepted: Level of Service

## 2022-09-01 ENCOUNTER — Encounter: Payer: Self-pay | Admitting: Student

## 2022-09-01 ENCOUNTER — Ambulatory Visit (INDEPENDENT_AMBULATORY_CARE_PROVIDER_SITE_OTHER): Payer: Medicare HMO | Admitting: Student

## 2022-09-01 VITALS — BP 134/72 | HR 79 | Temp 97.5°F | Ht 72.0 in | Wt 225.7 lb

## 2022-09-01 DIAGNOSIS — R21 Rash and other nonspecific skin eruption: Secondary | ICD-10-CM | POA: Diagnosis not present

## 2022-09-01 DIAGNOSIS — H6692 Otitis media, unspecified, left ear: Secondary | ICD-10-CM

## 2022-09-01 DIAGNOSIS — E119 Type 2 diabetes mellitus without complications: Secondary | ICD-10-CM

## 2022-09-01 DIAGNOSIS — I10 Essential (primary) hypertension: Secondary | ICD-10-CM | POA: Diagnosis not present

## 2022-09-01 DIAGNOSIS — F17210 Nicotine dependence, cigarettes, uncomplicated: Secondary | ICD-10-CM

## 2022-09-01 DIAGNOSIS — L0100 Impetigo, unspecified: Secondary | ICD-10-CM | POA: Diagnosis not present

## 2022-09-01 DIAGNOSIS — H6090 Unspecified otitis externa, unspecified ear: Secondary | ICD-10-CM | POA: Insufficient documentation

## 2022-09-01 DIAGNOSIS — H60503 Unspecified acute noninfective otitis externa, bilateral: Secondary | ICD-10-CM

## 2022-09-01 LAB — POCT GLYCOSYLATED HEMOGLOBIN (HGB A1C): Hemoglobin A1C: 6.2 % — AB (ref 4.0–5.6)

## 2022-09-01 LAB — GLUCOSE, CAPILLARY: Glucose-Capillary: 141 mg/dL — ABNORMAL HIGH (ref 70–99)

## 2022-09-01 MED ORDER — CIPROFLOXACIN HCL 0.2 % OT SOLN: 0.2000 mL | Freq: Two times a day (BID) | OTIC | 0 refills | Status: DC

## 2022-09-01 MED ORDER — AMOXICILLIN-POT CLAVULANATE 875-125 MG PO TABS: 1.0000 | ORAL_TABLET | Freq: Two times a day (BID) | ORAL | 0 refills | Status: DC

## 2022-09-01 NOTE — Assessment & Plan Note (Signed)
Patient with honey-colored crusting overlying bilateral ear lobules and anti-tragus. States that he developed crusting over L ear about 2 weeks ago (during which he was being treated with doxycycline for skin rash and otitis media). This worsened after completion of antibiotics and has since spread to R ear as well. He does complain of yellowish drainage from these lesions, noting that it is apparent on his pillows when he wakes up. This does appear to be impetigo given exam findings. Will treat with 1-week course of augmentin and have him follow up in 1 week to assess for improvement.  Plan: -1-week course of Augmentin -f/u in 1 week

## 2022-09-01 NOTE — Assessment & Plan Note (Signed)
Patient living with well-controlled T2DM, currently on synjardy 5-1000mg  BID. A1c improved from 6.9% to 6.2% on check today. Continue current regimen and repeat A1c testing in 3 months.

## 2022-09-01 NOTE — Assessment & Plan Note (Signed)
Patient living with HTN, currently on norvasc 10mg  daily, losartan-hctz 100-25mg  daily, and coreg 12.5mg  BID. BP 134/72 on check today, at goal.  Plan: -continue current regimen

## 2022-09-01 NOTE — Patient Instructions (Signed)
Jesus Martin,  It was a pleasure seeing you in the clinic today.   For your ear infection: I have prescribed 2 different antibiotics for this. One is called Augmentin which is an oral antibiotic. Please take this twice a day for 1 week. I have also prescribed Ciprofloxacin ear drops. Please use 1 container in each ear twice a day for 1 week. Please follow up in 1 week for your next visit so that we can make sure this is improving. Your A1c is at a great place so keep up the good work.  Please call our clinic at [phone] if you have any questions or concerns. The best time to call is Monday-Friday from 9am-4pm, but there is someone available 24/7 at the same number. If you need medication refills, please notify your pharmacy one week in advance and they will send [place] a request.   Thank you for letting us take part in your care. We look forward to seeing you next time!

## 2022-09-01 NOTE — Progress Notes (Signed)
   CC: f/u T2DM  HPI:  [name] is a 62 y.o. male with history listed below presenting to the [facility] for f/u T2DM. Please see individualized problem based charting for full HPI.  Past Medical History:  Diagnosis Date   Bipolar I disorder [facility]) 1991   Dr. Toy Care   Borderline hyperlipidemia    history of   Chest pain 12/12/2019   COPD (chronic obstructive pulmonary disease) ([place])    Diabetes mellitus, new onset ([place]) 12/22/2017   Fatigue 04/16/2020   Fibromyalgia    GERD (gastroesophageal reflux disease)    HTN (hypertension)    Lower back pain    Lumbar vertebral fracture ([place]) 01/29/2019   L1   Paranoia ([place])    Tobacco abuse    Vitamin D deficiency     Review of Systems:  Negative aside from that listed in individualized problem based charting.  Physical Exam:  Vitals:   09/01/22 0936  BP: 134/72  Pulse: 79  Temp: (!) 97.5 F (36.4 C)  TempSrc: Oral  SpO2: 100%  Weight: 225 lb 11.2 oz (102.4 kg)  Height: 6' (1.829 m)   Physical Exam Constitutional:      Appearance: Normal appearance. He is obese. He is not ill-appearing.  HENT:     Head: Normocephalic and atraumatic.     Comments: Maculopapular rash on back of neck, part of scalp, and bilateral face. Lesions are blanching and are not confluent. There is no active drainage noted. Areas with rash are mildly tender to palpation. No crusting or bullae noted on these areas.     Ears:     Comments: L and R external ears with honey-colored crusting overlying bilateral ear lobules and anti-tragus. L internal ear with erythema and presence of effusion. R internal ear with dried blood but otherwise no notable erythema or effusion. No active drainage from either ear on exam.    Mouth/Throat:     Mouth: Mucous membranes are moist.     Pharynx: Oropharynx is clear. No oropharyngeal exudate.  Eyes:     Extraocular Movements: Extraocular movements intact.     Conjunctiva/sclera: Conjunctivae normal.     Pupils:  Pupils are equal, round, and reactive to light.  Cardiovascular:     Rate and Rhythm: Normal rate and regular rhythm.     Heart sounds: Normal heart sounds. No murmur heard.    No friction rub. No gallop.  Pulmonary:     Effort: Pulmonary effort is normal.     Breath sounds: Normal breath sounds. No wheezing, rhonchi or rales.  Musculoskeletal:        General: No swelling. Normal range of motion.  Skin:    General: Skin is warm and dry.  Neurological:     General: No focal deficit present.     Mental Status: He is alert and oriented to person, place, and time.  Psychiatric:        Mood and Affect: Mood normal.        Behavior: Behavior normal.      Assessment & Plan:   See Encounters Tab for problem based charting.  Patient seen with Dr. Philipp Ovens

## 2022-09-01 NOTE — Assessment & Plan Note (Signed)
Patient with persistence of skin rash noted on previous 2 visits. He reports improvement when he was on doxycycline, but notes persistence and worsening after completion of antibiotics. Skin rash does appear maculopapular in nature, is blanching, and without any areas of confluence. Locations include back of neck, scalp, and bilateral face (not in symmetric distrubution). Unclear etiology of rash, but given improvement while on antibiotics previously, concern for etiology being infectious in nature. Given concomitant otitis media, will treat with 1-week course of augmentin and have him follow up in 1 week to assess for improvement. If no improvement noted, will refer to dermatology for further evaluation and possible biopsy.   Plan: -augmentin BID for 1 week -f/u in 1 week -if no improvement, referral to dermatology

## 2022-09-01 NOTE — Assessment & Plan Note (Signed)
Patient noted to have otitis media at previous visit. He was already on doxycycline at that time for skin rash and thus was advised to finish course of doxycycline. After completion, he noted persistence of L ear pain and decreased hearing. Ear exam with erythema of ear canal and presence of effusion at L tympanic membrane. R ear without similar findings. Will treat for otitis media of L ear with augmentin BID for 1 week.  Plan: -Augmentin 875-125mg  BID for 1 week -f/u in 1 week

## 2022-09-01 NOTE — Assessment & Plan Note (Signed)
Patient complaining of bilateral ear drainage that is yellow/green in color over the past couple of weeks. States that his ear is tender and his hearing is diminished after this has began. Internal ear exam with L ear erythema along with effusion (see otitis media) and R ear with dried blood but no notable erythema. No active drainage or significant swelling noted on exam, but patient and wife note drainage throughout day at home. Given history, suspicion for otitis externa. His diabetes is well-controlled. Low concern for necrotizing infection. Will treat with ciprofloxacin otic drops bilaterally BID for 1 week.  Plan: -ciprofloxain otic drops bilaterally BID for 1 week -f/u in 1 week

## 2022-09-03 NOTE — Addendum Note (Signed)
Addended by: Jodean Lima on: 09/03/2022 02:30 PM   Modules accepted: Level of Service

## 2022-09-03 NOTE — Progress Notes (Signed)
Internal Medicine Clinic Attending  I saw and evaluated the patient.  I personally confirmed the key portions of the history and exam documented by Dr. Jinwala and I reviewed pertinent patient test results.  The assessment, diagnosis, and plan were formulated together and I agree with the documentation in the resident's note.  

## 2022-09-04 ENCOUNTER — Other Ambulatory Visit: Payer: Self-pay | Admitting: Student

## 2022-09-04 DIAGNOSIS — H60503 Unspecified acute noninfective otitis externa, bilateral: Secondary | ICD-10-CM

## 2022-09-04 MED ORDER — OFLOXACIN 0.3 % OT SOLN: 10.0000 [drp] | Freq: Every day | OTIC | 0 refills | Status: DC

## 2022-09-04 MED ORDER — OFLOXACIN 0.3 % OT SOLN: 10.0000 [drp] | Freq: Every day | OTIC | 0 refills | Status: AC

## 2022-09-04 NOTE — Assessment & Plan Note (Signed)
Please see my previous note from 09/01/2022 for full details. Patient's pharmacy does not cover ciprofloxacin otic drops. Will switch to Ofloxacin otic drops, which will be covered. Advised to use 10 drops (0.20mL) in each ear once daily for 7 days.

## 2022-09-08 ENCOUNTER — Other Ambulatory Visit: Payer: Self-pay | Admitting: Internal Medicine

## 2022-09-08 ENCOUNTER — Ambulatory Visit (INDEPENDENT_AMBULATORY_CARE_PROVIDER_SITE_OTHER): Payer: Medicare HMO | Admitting: Student

## 2022-09-08 ENCOUNTER — Other Ambulatory Visit: Payer: Self-pay

## 2022-09-08 ENCOUNTER — Encounter: Payer: Self-pay | Admitting: Student

## 2022-09-08 VITALS — BP 113/70 | HR 81 | Temp 97.8°F | Ht 72.0 in | Wt 224.0 lb

## 2022-09-08 DIAGNOSIS — R21 Rash and other nonspecific skin eruption: Secondary | ICD-10-CM

## 2022-09-08 DIAGNOSIS — Z2809 Immunization not carried out because of other contraindication: Secondary | ICD-10-CM | POA: Diagnosis not present

## 2022-09-08 DIAGNOSIS — E119 Type 2 diabetes mellitus without complications: Secondary | ICD-10-CM

## 2022-09-08 DIAGNOSIS — Z7984 Long term (current) use of oral hypoglycemic drugs: Secondary | ICD-10-CM

## 2022-09-08 DIAGNOSIS — H6692 Otitis media, unspecified, left ear: Secondary | ICD-10-CM

## 2022-09-08 DIAGNOSIS — L0100 Impetigo, unspecified: Secondary | ICD-10-CM | POA: Diagnosis not present

## 2022-09-08 DIAGNOSIS — G8929 Other chronic pain: Secondary | ICD-10-CM

## 2022-09-08 DIAGNOSIS — H60503 Unspecified acute noninfective otitis externa, bilateral: Secondary | ICD-10-CM | POA: Diagnosis not present

## 2022-09-08 DIAGNOSIS — Z23 Encounter for immunization: Secondary | ICD-10-CM

## 2022-09-08 DIAGNOSIS — E1142 Type 2 diabetes mellitus with diabetic polyneuropathy: Secondary | ICD-10-CM

## 2022-09-08 MED ORDER — ZOSTER VAC RECOMB ADJUVANTED 50 MCG/0.5ML IM SUSR: 0.5000 mL | Freq: Once | INTRAMUSCULAR | 0 refills | Status: AC

## 2022-09-08 MED ORDER — AMOXICILLIN-POT CLAVULANATE 875-125 MG PO TABS: 1.0000 | ORAL_TABLET | Freq: Two times a day (BID) | ORAL | 0 refills | Status: AC

## 2022-09-08 NOTE — Assessment & Plan Note (Signed)
He has persistence of honey-colored crusting overlying his R ear lobule, although the crusting overlying L ear lobule has resolved (minor dry skin overlying this area now). He has finished 7 days of Augmentin therapy (last day today). Given persistence of symptoms, we discussed extending course of Augmentin by 3 additional days for a total 10-day course. He previously received 7 days of doxycycline for treatment as well.  Reassess symptoms at next visit in a couple of weeks.  Plan: -extending Augmentin course for 3 more days -f/u on 2/19 with PCP

## 2022-09-08 NOTE — Assessment & Plan Note (Signed)
Patient continuing to experience decreased hearing in L ear. His ear pain has significantly improved. On exam, his effusion has improved as well and no erythema noted today.   Patient already had decreased hearing at baseline prior to the development of recent ear infection. He uses ear aids at home which previously helped to a certain degree. He notes that he is unable to hear out of his L ear now (similar to last visit) and is wondering if this is from continued infection. We discussed extending his Augmentin course for 3 additional days and then reassess response at next visit on 09/28/2022 with his PCP (Dr. Howie Ill).   If no improvement in infection and/or persistence of decreased hearing, consider ENT referral for further evaluation.  Plan: -finish course of augmentin -f/u on 2/19 with PCP -consider ENT referral if no improvement in infection and/or hearing

## 2022-09-08 NOTE — Assessment & Plan Note (Signed)
Checking urine ACr today. Next A1c testing in about 3 months. Continue synjardy.

## 2022-09-08 NOTE — Assessment & Plan Note (Signed)
He obtained his ofloxacin otic drops 3 days ago and thus has 4 days of treatment remaining. He does note decreased drainage after initiation.   -complete course of ofloxacin otic drops

## 2022-09-08 NOTE — Patient Instructions (Signed)
Mr. Alamo,  It was a pleasure seeing you in the clinic today.   I have prescribed 3 more days of Augmentin to help with your infection. We are checking for protein in your urine given your diabetes. I will call you with the results. I have provided a script for your Shingles vaccine. You can get this at any pharmacy. Please come back on 09/28/2022 for your next visit.  Please call our clinic at [phone] if you have any questions or concerns. The best time to call is Monday-Friday from 9am-4pm, but there is someone available 24/7 at the same number. If you need medication refills, please notify your pharmacy one week in advance and they will send [place] a request.   Thank you for letting us take part in your care. We look forward to seeing you next time!

## 2022-09-08 NOTE — Assessment & Plan Note (Signed)
Skin rash has resolved with 1 week course of augmentin.

## 2022-09-08 NOTE — Progress Notes (Signed)
   CC: f/u rash and ear infection  HPI:  Mr.Lonell W Graybeal is a 62 y.o. male with history listed below presenting to the [facility] for f/u of rash and ear infection. Please see individualized problem based charting for full HPI.  Past Medical History:  Diagnosis Date   Bipolar I disorder [facility]) 1991   Dr. Toy Care   Borderline hyperlipidemia    history of   Chest pain 12/12/2019   COPD (chronic obstructive pulmonary disease) ([place])    Diabetes mellitus, new onset ([place]) 12/22/2017   Fatigue 04/16/2020   Fibromyalgia    GERD (gastroesophageal reflux disease)    HTN (hypertension)    Lower back pain    Lumbar vertebral fracture ([place]) 01/29/2019   L1   Paranoia ([place])    Tobacco abuse    Vitamin D deficiency     Review of Systems:  Negative aside from that listed in individualized problem based charting.  Physical Exam:  Vitals:   09/08/22 1016  BP: 113/70  Pulse: 81  Temp: 97.8 F (36.6 C)  TempSrc: Oral  SpO2: 98%  Weight: 224 lb (101.6 kg)  Height: 6' (1.829 m)   Physical Exam Constitutional:      Appearance: He is obese. He is not ill-appearing.  HENT:     Ears:     Comments: External exam without any tenderness with manipulation or palpation of outer ear. Persistence of honey-colored crusted lesion overlying R ear lobule. Previous similar lesion on L ear lobule has now resolved. Internal ear exam with improvement of previously noted effusion in L ear, otherwise no abnormalities. R internal ear exam with small amount of dried blood, otherwise no abnormalities.      Mouth/Throat:     Mouth: Mucous membranes are moist.     Pharynx: Oropharynx is clear.  Eyes:     Extraocular Movements: Extraocular movements intact.     Conjunctiva/sclera: Conjunctivae normal.     Pupils: Pupils are equal, round, and reactive to light.  Cardiovascular:     Rate and Rhythm: Normal rate and regular rhythm.     Heart sounds: Normal heart sounds. No murmur heard.    No gallop.  Pulmonary:      Effort: Pulmonary effort is normal.     Breath sounds: Normal breath sounds. No wheezing, rhonchi or rales.  Abdominal:     General: Bowel sounds are normal. There is no distension.     Palpations: Abdomen is soft.     Tenderness: There is no abdominal tenderness.  Musculoskeletal:        General: No swelling. Normal range of motion.  Skin:    General: Skin is warm and dry.     Comments: Previous skin rash now resolved. No lesions/rash noted on skin.  Neurological:     Mental Status: He is alert and oriented to person, place, and time. Mental status is at baseline.  Psychiatric:        Mood and Affect: Mood normal.        Behavior: Behavior normal.      Assessment & Plan:   See Encounters Tab for problem based charting.  Patient discussed with Dr.  Cain Sieve

## 2022-09-08 NOTE — Progress Notes (Signed)
Internal Medicine Clinic Attending  Case discussed with Dr. Allyson Sabal  At the time of the visit.  We reviewed the resident's history and exam and pertinent patient test results.  I agree with the assessment, diagnosis, and plan of care documented in the resident's note.    This is patient's 3rd clinic visit for his ear issues.  If he continues to develop otitis media and/or otitis externa, I think he would warrant an ENT referral.  For today, will extend course of Augmentin to 10 total days since he is much improved from last week.

## 2022-09-08 NOTE — Assessment & Plan Note (Signed)
Provided script for first dose of Shingrix vaccine, which he will obtain at a local pharmacy. Also states that he will obtain a flu shot at the same time there.

## 2022-09-09 NOTE — Telephone Encounter (Signed)
PDMP reviewed and appropriate. Plan to get UDS at follow-up 2/24 with PCP.

## 2022-09-10 LAB — MICROALBUMIN / CREATININE URINE RATIO
Creatinine, Urine: 40.3 mg/dL
Microalb/Creat Ratio: <7 mg/g creat (ref 0–29)
Microalbumin, Urine: <3 ug/mL

## 2022-09-28 ENCOUNTER — Ambulatory Visit (INDEPENDENT_AMBULATORY_CARE_PROVIDER_SITE_OTHER): Payer: Medicare HMO | Admitting: Internal Medicine

## 2022-09-28 ENCOUNTER — Encounter: Payer: Self-pay | Admitting: Internal Medicine

## 2022-09-28 VITALS — BP 130/72 | HR 80 | Temp 97.5°F | Ht 72.0 in | Wt 222.4 lb

## 2022-09-28 DIAGNOSIS — H7292 Unspecified perforation of tympanic membrane, left ear: Secondary | ICD-10-CM

## 2022-09-28 DIAGNOSIS — E0590 Thyrotoxicosis, unspecified without thyrotoxic crisis or storm: Secondary | ICD-10-CM

## 2022-09-28 DIAGNOSIS — Z23 Encounter for immunization: Secondary | ICD-10-CM

## 2022-09-28 NOTE — Progress Notes (Addendum)
Subjective:  CC: Hearing in left ear  HPI:  Jesus Martin is a 62 y.o. male with a past medical history stated below and presents today for follow-up after dealing with bad ear infection symptoms ears for several.  He was treated with doxycycline and Augmentin for this.  His symptoms have resolved and he no longer has pain in his ears.  He has noted some decreased hearing present to left side. Please see problem based assessment and plan for additional details.  Past Medical History:  Diagnosis Date   Bipolar I disorder ([place]) 1991   Dr. Toy Care   Borderline hyperlipidemia    history of   Chest pain 12/12/2019   COPD (chronic obstructive pulmonary disease) ([place])    Diabetes mellitus, new onset ([place]) 12/22/2017   Fatigue 04/16/2020   Fibromyalgia    GERD (gastroesophageal reflux disease)    HTN (hypertension)    Lower back pain    Lumbar vertebral fracture ([place]) 01/29/2019   L1   Paranoia ([place])    Tobacco abuse    Viral conjunctivitis of both eyes 08/14/2022   Onset 2 days ago. Eyes are itchy and painful. Pt having tearing of both eyes.    Vitamin D deficiency     Current Outpatient Medications on File Prior to Visit  Medication Sig Dispense Refill   Accu-Chek FastClix Lancets MISC Check blood sugar 3 times a day. 102 each 11   amLODipine (NORVASC) 10 MG tablet Take 1 tablet by mouth once daily 90 tablet 3   atorvastatin (LIPITOR) 40 MG tablet TAKE 1 TABLET BY MOUTH ONCE DAILY AT  6PM 90 tablet 3   blood glucose meter kit and supplies KIT Dispense based on patient and insurance preference. Use up to four times daily as directed. (FOR ICD-9 250.00, 250.01). 1 each 0   Blood Glucose Monitoring Suppl (ACCU-CHEK GUIDE) w/Device KIT 1 each by Does not apply route 3 (three) times daily. 1 kit 1   carvedilol (COREG) 12.5 MG tablet TAKE 1 TABLET BY MOUTH TWICE DAILY WITH A MEAL 180 tablet 3   diclofenac sodium (VOLTAREN) 1 % GEL Apply 2 g topically 4 (four) times daily. 350 g 6    Empagliflozin-metFORMIN HCl (SYNJARDY) 12-998 MG TABS Take 1 tablet by mouth twice daily 180 tablet 3   EPINEPHrine 0.3 mg/0.3 mL IJ SOAJ injection Inject 0.3 mg into the muscle once as needed (for anaphylactic reaction to bee stings).     Fluticasone-Umeclidin-Vilant (TRELEGY ELLIPTA) 100-62.5-25 MCG/ACT AEPB INHALE 1 PUFF ONCE DAILY 60 each 3   glucose blood (ACCU-CHEK GUIDE) test strip USE 1  THREE TIMES DAILY TO CHECK BLOOD SUGAR 100 each 9   hydrocortisone 1 % ointment Apply 1 Application topically 2 (two) times daily. 30 g 2   ipratropium-albuterol (DUONEB) 0.5-2.5 (3) MG/3ML SOLN Take 3 mLs by nebulization every 4 (four) hours as needed. 360 mL 1   lamoTRIgine (LAMICTAL) 150 MG tablet Take 150 mg by mouth 2 (two) times daily.     lithium carbonate (ESKALITH) 450 MG CR tablet Take 450-675 mg by mouth See admin instructions. 450 mg     losartan-hydrochlorothiazide (HYZAAR) 100-25 MG tablet Take 1 tablet by mouth once daily 90 tablet 3   multivitamin (ONE-A-DAY MEN'S) TABS tablet Take 1 tablet by mouth daily.     omeprazole (PRILOSEC) 40 MG capsule Take 1 capsule by mouth once daily 90 capsule 2   pregabalin (LYRICA) 150 MG capsule Take 1 capsule by mouth twice daily  60 capsule 0   QUEtiapine (SEROQUEL) 50 MG tablet Take 225 mg by mouth at bedtime.     tamsulosin (FLOMAX) 0.4 MG CAPS capsule Take 1 capsule (0.4 mg total) by mouth daily. 30 capsule 11   traMADol (ULTRAM) 50 MG tablet Take 1 tablet by mouth twice daily as needed 60 tablet 0   VENTOLIN HFA 108 (90 Base) MCG/ACT inhaler INHALE 2 PUFFS BY MOUTH EVERY 6 HOURS AS NEEDED FOR WHEEZING OR SHORTNESS OF BREATH 18 g 3   No current facility-administered medications on file prior to visit.    Family History  Adopted: Yes    Social History   Socioeconomic History   Marital status: Married    Spouse name: Not on file   Number of children: Not on file   Years of education: Not on file   Highest education level: Not on file   Occupational History   Not on file  Tobacco Use   Smoking status: Every Day    Packs/day: 0.75    Years: 45.00    Total pack years: 33.75    Types: Cigarettes   Smokeless tobacco: Never   Tobacco comments:    3/4 pack smoked daily ARJ 12/09/21  Vaping Use   Vaping Use: Never used  Substance and Sexual Activity   Alcohol use: Yes    Comment: Beer sometimes.   Drug use: No   Sexual activity: Not on file  Other Topics Concern   Not on file  Social History Narrative   Lives with wife Troy Soldo (married 41 years) and son. Has 4 dogs and fish. Not employed. Woodworking for fun. HS graduate.       Quit smoking in 2008. 26 pack years. No recreational drug use. Drinks 1 glass of wine per month. Does not exercise.    Social Determinants of Health   Financial Resource Strain: Low Risk  (02/03/2022)   Overall Financial Resource Strain (CARDIA)    Difficulty of Paying Living Expenses: Not hard at all  Food Insecurity: No Food Insecurity (02/03/2022)   Hunger Vital Sign    Worried About Running Out of Food in the Last Year: Never true    Ran Out of Food in the Last Year: Never true  Transportation Needs: No Transportation Needs (02/03/2022)   PRAPARE - Hydrologist (Medical): No    Lack of Transportation (Non-Medical): No  Physical Activity: Inactive (02/03/2022)   Exercise Vital Sign    Days of Exercise per Week: 0 days    Minutes of Exercise per Session: 0 min  Stress: Stress Concern Present (02/03/2022)   [place]    Feeling of Stress : To some extent  Social Connections: Moderately Isolated (02/03/2022)   Social Connection and Isolation Panel [NHANES]    Frequency of Communication with Friends and Family: Never    Frequency of Social Gatherings with Friends and Family: Once a week    Attends Religious Services: 1 to 4 times per year    Active Member of [employer] or Organizations: No     Attends Archivist Meetings: Never    Marital Status: Married  Human resources officer Violence: Not At Risk (02/03/2022)   Humiliation, Afraid, Rape, and Kick questionnaire    Fear of Current or Ex-Partner: No    Emotionally Abused: No    Physically Abused: No    Sexually Abused: No    Review of Systems: ROS negative except for  what is noted on the assessment and plan.  Objective:   Vitals:   09/28/22 0959  BP: 130/72  Pulse: 80  Temp: (!) 97.5 F (36.4 C)  TempSrc: Oral  SpO2: 98%  Weight: 222 lb 6.4 oz (100.9 kg)  Height: 6' (1.829 m)    Physical Exam: Constitutional: well-appearing  HENT: No erythema or drainage to left external canal on the left side, tympanic membrane with perforation to the left anteriorinferior quadrant Cardiovascular: regular rate and rhythm, no m/r/g Pulmonary/Chest: normal work of breathing on room air, lungs clear to auscultation bilaterally Skin: warm and dry   Assessment & Plan:  Need for shingles vaccine He has not gotten his shingles vaccine yet.  His wife states that they have to make an appointment prior to getting this.  Subclinical hyperthyroidism Lab work from June consistent with subclinical hyperthyroidism with TSH at 0.006 and normal to lower/T3.  Nuclear medicine thyroid scan was ordered, however patient did not.  I asked him about this and they said that they received a letter today prior to scheduled scan stating the patient needed to hold several medications and they were frustrated as they were no longer able to do that as letter was received to date.  They would be open to receiving the scan now. A/P: NM thyroid scan  Perforation of tympanic membrane, left Mr. Hemmingway had a severe ear infection that has been treated with antibiotics.  He no longer has pain in his ears, but does note some decreased hearing in the left ear.  On exam he does have a perforation of his tympanic membrane. A/P: Perforation is small and anterior  inferior quadrant.  I would like to referral to ENT to make sure TM continues to heal for the patient may need a patch to help with this.    Patient discussed with Dr. Walden Field Kathlyn Leachman, D.O. [facility]  PGY-2 Pager: [phone]  Phone: [phone] Date 09/29/2022  Time 7:57 AM

## 2022-09-28 NOTE — Assessment & Plan Note (Signed)
Lab work from June consistent with subclinical hyperthyroidism with TSH at 0.006 and normal to lower/T3.  Nuclear medicine thyroid scan was ordered, however patient did not.  I asked him about this and they said that they received a letter today prior to scheduled scan stating the patient needed to hold several medications and they were frustrated as they were no longer able to do that as letter was received to date.  They would be open to receiving the scan now. A/P: NM thyroid scan

## 2022-09-28 NOTE — Assessment & Plan Note (Signed)
He has not gotten his shingles vaccine yet.  His wife states that they have to make an appointment prior to getting this.

## 2022-09-28 NOTE — Assessment & Plan Note (Signed)
Jesus Martin had a severe ear infection that has been treated with antibiotics.  He no longer has pain in his ears, but does note some decreased hearing in the left ear.  On exam he does have a perforation of his tympanic membrane. A/P: Perforation is small and anterior inferior quadrant.  I would like to referral to ENT to make sure TM continues to heal for the patient may need a patch to help with this.

## 2022-09-28 NOTE — Patient Instructions (Signed)
Thank you, [name] for allowing [place] to provide your care today.   Ear drum I have referred you to Ear, Nose, and throat doctor. They may want to place a patch on your ear drum if it is not healing up.   Rash If the rash is bothersome try hydrocortisone cream.  Thyroid I will check with front desk about coordinating the thyroid scan. I will call with an update on Friday.  Follow-up in 3 months for diabetes blood work  Referrals ordered today:    Referral Orders         Ambulatory referral to ENT      I have ordered the following medication/changed the following medications:   Stop the following medications: Medications Discontinued During This Encounter  Medication Reason   methocarbamol (ROBAXIN) 500 MG tablet    alprazolam (XANAX) 2 MG tablet      Start the following medications: No orders of the defined types were placed in this encounter.    We look forward to seeing you next time. Please call our clinic at [phone] if you have any questions or concerns. The best time to call is Monday-Friday from 9am-4pm, but there is someone available 24/7. If after hours or the weekend, call the main hospital number and ask for the Internal Medicine Resident On-Call. If you need medication refills, please notify your pharmacy one week in advance and they will send [place] a request.   Thank you for trusting me with your care. Wishing you the best!   Christiana Fuchs, [place]

## 2022-10-05 NOTE — Progress Notes (Signed)
Internal Medicine Clinic Attending  Case discussed with Dr. Howie Ill  at the time of the visit.  We reviewed the resident's history and exam and pertinent patient test results.  I agree with the assessment, diagnosis, and plan of care documented in the resident's note.

## 2022-10-06 ENCOUNTER — Other Ambulatory Visit: Payer: Self-pay | Admitting: Internal Medicine

## 2022-10-06 DIAGNOSIS — E1142 Type 2 diabetes mellitus with diabetic polyneuropathy: Secondary | ICD-10-CM

## 2022-10-06 DIAGNOSIS — E119 Type 2 diabetes mellitus without complications: Secondary | ICD-10-CM

## 2022-10-06 DIAGNOSIS — G8929 Other chronic pain: Secondary | ICD-10-CM

## 2022-10-10 ENCOUNTER — Other Ambulatory Visit: Payer: Self-pay | Admitting: Internal Medicine

## 2022-10-10 DIAGNOSIS — K219 Gastro-esophageal reflux disease without esophagitis: Secondary | ICD-10-CM

## 2022-10-13 NOTE — Telephone Encounter (Signed)
Next appt scheduled 5/20 with Dr Lisabeth Devoid.

## 2022-10-19 ENCOUNTER — Encounter (HOSPITAL_COMMUNITY)
Admission: RE | Admit: 2022-10-19 | Discharge: 2022-10-19 | Disposition: A | Discharge status: Home / Self Care | Payer: Medicare HMO | Source: Ambulatory Visit | Attending: Internal Medicine | Admitting: Internal Medicine

## 2022-10-19 DIAGNOSIS — E0590 Thyrotoxicosis, unspecified without thyrotoxic crisis or storm: Secondary | ICD-10-CM | POA: Diagnosis not present

## 2022-10-19 MED ORDER — SODIUM IODIDE I-123 7.4 MBQ CAPS
432.0000 | ORAL_CAPSULE | Freq: Once | ORAL | Status: AC
  Administered 2022-10-19: 432 via ORAL

## 2022-10-20 ENCOUNTER — Encounter (HOSPITAL_COMMUNITY)
Admission: RE | Admit: 2022-10-20 | Discharge: 2022-10-20 | Disposition: A | Discharge status: Home / Self Care | Payer: Medicare HMO | Source: Ambulatory Visit | Attending: Internal Medicine | Admitting: Internal Medicine

## 2022-10-21 DIAGNOSIS — E0590 Thyrotoxicosis, unspecified without thyrotoxic crisis or storm: Secondary | ICD-10-CM | POA: Diagnosis not present

## 2022-11-03 ENCOUNTER — Other Ambulatory Visit: Payer: Self-pay | Admitting: Internal Medicine

## 2022-11-03 DIAGNOSIS — E119 Type 2 diabetes mellitus without complications: Secondary | ICD-10-CM

## 2022-11-03 DIAGNOSIS — G8929 Other chronic pain: Secondary | ICD-10-CM

## 2022-11-03 DIAGNOSIS — E1142 Type 2 diabetes mellitus with diabetic polyneuropathy: Secondary | ICD-10-CM

## 2022-11-25 ENCOUNTER — Other Ambulatory Visit: Payer: Self-pay | Admitting: Internal Medicine

## 2022-12-01 ENCOUNTER — Other Ambulatory Visit: Payer: Self-pay | Admitting: Internal Medicine

## 2022-12-01 DIAGNOSIS — E1142 Type 2 diabetes mellitus with diabetic polyneuropathy: Secondary | ICD-10-CM

## 2022-12-01 DIAGNOSIS — G8929 Other chronic pain: Secondary | ICD-10-CM

## 2022-12-01 DIAGNOSIS — E119 Type 2 diabetes mellitus without complications: Secondary | ICD-10-CM

## 2022-12-05 ENCOUNTER — Other Ambulatory Visit: Payer: Self-pay | Admitting: Internal Medicine

## 2022-12-05 DIAGNOSIS — L282 Other prurigo: Secondary | ICD-10-CM

## 2022-12-22 ENCOUNTER — Ambulatory Visit (INDEPENDENT_AMBULATORY_CARE_PROVIDER_SITE_OTHER): Payer: Medicare HMO | Admitting: Student

## 2022-12-22 VITALS — BP 103/64 | HR 75 | Temp 97.8°F | Wt 213.2 lb

## 2022-12-22 DIAGNOSIS — F17210 Nicotine dependence, cigarettes, uncomplicated: Secondary | ICD-10-CM

## 2022-12-22 DIAGNOSIS — E119 Type 2 diabetes mellitus without complications: Secondary | ICD-10-CM

## 2022-12-22 DIAGNOSIS — H7292 Unspecified perforation of tympanic membrane, left ear: Secondary | ICD-10-CM | POA: Diagnosis not present

## 2022-12-22 DIAGNOSIS — N179 Acute kidney failure, unspecified: Secondary | ICD-10-CM

## 2022-12-22 DIAGNOSIS — Z7984 Long term (current) use of oral hypoglycemic drugs: Secondary | ICD-10-CM

## 2022-12-22 DIAGNOSIS — I10 Essential (primary) hypertension: Secondary | ICD-10-CM | POA: Diagnosis not present

## 2022-12-22 DIAGNOSIS — R634 Abnormal weight loss: Secondary | ICD-10-CM | POA: Diagnosis not present

## 2022-12-22 DIAGNOSIS — E1149 Type 2 diabetes mellitus with other diabetic neurological complication: Secondary | ICD-10-CM | POA: Diagnosis not present

## 2022-12-22 DIAGNOSIS — F3161 Bipolar disorder, current episode mixed, mild: Secondary | ICD-10-CM | POA: Diagnosis not present

## 2022-12-22 DIAGNOSIS — Z1211 Encounter for screening for malignant neoplasm of colon: Secondary | ICD-10-CM

## 2022-12-22 DIAGNOSIS — F319 Bipolar disorder, unspecified: Secondary | ICD-10-CM

## 2022-12-22 DIAGNOSIS — Z720 Tobacco use: Secondary | ICD-10-CM

## 2022-12-22 DIAGNOSIS — J449 Chronic obstructive pulmonary disease, unspecified: Secondary | ICD-10-CM

## 2022-12-22 DIAGNOSIS — R21 Rash and other nonspecific skin eruption: Secondary | ICD-10-CM

## 2022-12-22 LAB — POCT GLYCOSYLATED HEMOGLOBIN (HGB A1C): Hemoglobin A1C: 6.4 % — AB (ref 4.0–5.6)

## 2022-12-22 LAB — GLUCOSE, CAPILLARY: Glucose-Capillary: 177 mg/dL — ABNORMAL HIGH (ref 70–99)

## 2022-12-22 NOTE — Patient Instructions (Addendum)
It was a pleasure seeing you in clinic today  We will check you CMP today for monitoring while on lithium. Will hold off on checking lithium levels as you have not taken the medication in 3 weeks, but are happy to check on these as needed while on lithium   You A1c was 6.4% please continue your synjardy  Weight loss: I will refer you to GI for evaluation of your nausea and vomiting, you are also due to colonoscopy, and a CT scan of your lungs  Stopping insulin and COPD can have cause some weight loss You lab work recently does not appear to have abnormalities that would explain weight loss  Please call the ENT to set up an appointment [facility]ly [facility] [employer] [employer] [address]   [address] [place] [address]   [phone]   [phone] (FAX  Follow up in 2 months

## 2022-12-22 NOTE — Progress Notes (Unsigned)
   Established Patient Office Visit  Subjective   Patient ID: Jesus Martin, male    DOB: 01-Dec-1960  Age: 62 y.o. MRN: [id number]  No chief complaint on file.   HPI  {History (Optional):23778}  ROS    Objective:     There were no vitals taken for this visit. {Vitals History (Optional):23777}  Physical Exam   No results found for any visits on 12/22/22.  {Labs (Optional):23779}  The ASCVD Risk score (Arnett DK, et al., 2019) failed to calculate for the following reasons:   The valid total cholesterol range is 130 to 320 mg/dL    Assessment & Plan:   Problem List Items Addressed This Visit     Diabetes (HCC) - Primary (Chronic)   Relevant Orders   POC Hbg A1C    No follow-ups on file.    Quincy Simmonds, MD

## 2022-12-23 ENCOUNTER — Encounter: Payer: Self-pay | Admitting: Student

## 2022-12-23 DIAGNOSIS — Z1211 Encounter for screening for malignant neoplasm of colon: Secondary | ICD-10-CM | POA: Insufficient documentation

## 2022-12-23 DIAGNOSIS — Z0000 Encounter for general adult medical examination without abnormal findings: Secondary | ICD-10-CM | POA: Insufficient documentation

## 2022-12-23 LAB — CMP14 + ANION GAP
ALT: 30 IU/L (ref 0–44)
AST: 18 IU/L (ref 0–40)
Albumin/Globulin Ratio: 1.6 (ref 1.2–2.2)
Albumin: 4.2 g/dL (ref 3.9–4.9)
Alkaline Phosphatase: 97 IU/L (ref 44–121)
Anion Gap: 18 mmol/L (ref 10.0–18.0)
BUN/Creatinine Ratio: 15 (ref 10–24)
BUN: 22 mg/dL (ref 8–27)
Bilirubin Total: 0.3 mg/dL (ref 0.0–1.2)
CO2: 19 mmol/L — ABNORMAL LOW (ref 20–29)
Calcium: 10.1 mg/dL (ref 8.6–10.2)
Chloride: 105 mmol/L (ref 96–106)
Creatinine, Ser: 1.42 mg/dL — ABNORMAL HIGH (ref 0.76–1.27)
Globulin, Total: 2.7 g/dL (ref 1.5–4.5)
Glucose: 150 mg/dL — ABNORMAL HIGH (ref 70–99)
Potassium: 3.7 mmol/L (ref 3.5–5.2)
Sodium: 142 mmol/L (ref 134–144)
Total Protein: 6.9 g/dL (ref 6.0–8.5)
eGFR: 56 mL/min/{1.73_m2} — ABNORMAL LOW (ref >59)

## 2022-12-23 NOTE — Assessment & Plan Note (Signed)
Last colonoscopy in 2014 performed with [facility].  Demonstrated poor prep but no gross polyps or abnormalities.  Recommended 10-year follow-up.  He is due for repeat colonoscopy.  Referral to GI.

## 2022-12-23 NOTE — Assessment & Plan Note (Addendum)
A1c today is 6.4%.  Doing well on Synjardy.  Continue current medications.  Spouse will make him a follow-up ophthalmology appointment.

## 2022-12-23 NOTE — Assessment & Plan Note (Signed)
BP decreased to 103/64.  Home BPs have been systolic 120-1 30 and diastolic around 80.  No orthostatic symptoms today.  Will continue on current medications

## 2022-12-23 NOTE — Assessment & Plan Note (Signed)
Completed antibiotics for otitis media.  No longer having ear pain.  Continues to have mild decreased hearing in the left ear.  Does have a perforation of the left tympanic membrane.  Has not made follow-up with ENT.  I given contact information for ENT to schedule this.

## 2022-12-23 NOTE — Assessment & Plan Note (Signed)
Patient currently smoking approximately 1 pack a day.  Started smoking around age 62.  We discussed smoking cessation and he is trying to cut down.  He is not interested in medical therapy for this at this time.  Will continue to monitor.

## 2022-12-23 NOTE — Assessment & Plan Note (Addendum)
Patient notes weight loss since he was discontinued on insulin around January 2024.  Appears approximately 40 pound weight loss since July 20 26/80.  Patient reports normal appetite and meals.  No difficulties with chewing or swallowing.  Wife notes he occasionally forgets meals due to being busy but this is not a frequent occurrence.  Does not have poor dentition and dentures but this is not new and is not having issues with this.  He does endorse occasional nausea and vomiting couple times a week for the past 6 months.  States this happens sporadically and will feel suddenly bloated and throw up.  This is not associated with eating and he does not feel bloated after eating.  He denies abdominal pain, constipation, diarrhea, hematemesis or hematochezia.  He does take a PPI for reflux and does not have any reflux symptoms.  He only drinks alcohol a few times a year.    Patient also has COPD on Trelegy.  No recent exacerbations.  Does not feel his symptoms have worsened.  He does have a long history of smoking.  Low-dose CT on 10/24/2021 without abnormal lung findings.  CBC and CMP in January without significant abnormalities aside from mild hypercalcemia al which may be due to his lithium.  Thyroid studies with subclinical hyperthyroidism however thyroid imaging on 10/19/2022 without abnormalities.  Etiology of his weight loss is not completely clear.  May be related to his episodes of emesis.  COPD and discontinuation of insulin may contribute to some weight loss as well.  Will refer him to GI for further evaluation of his emesis.  He is also due for colonoscopy.  Low-dose lung CT ordered for lung cancer screening.  Follow-up in 2 months

## 2022-12-23 NOTE — Assessment & Plan Note (Signed)
No recent exacerbations or new lung symptoms.  Continue on Trelegy.  He is due for follow-up with pulmonology.  Spouse will help him set up follow-up for this.

## 2022-12-23 NOTE — Assessment & Plan Note (Addendum)
Follow-up with Dr. Evelene Croon.  Has been on his lithium for about 3 weeks due to concern for kidney function.  Reports no recent labs since January.  Mood has been a little more elevated and more talkative since then.  Will repeat CMP today.  Will not check lithium as he has been off of this.  Will mail results to patient for him to bring to his appointment.  Encouraged patient and spouse to make MyChart account to help facilitate this in the future.

## 2022-12-24 NOTE — Progress Notes (Signed)
Internal Medicine Clinic Attending ? ?Case discussed with Dr. Liang  At the time of the visit.  We reviewed the resident?s history and exam and pertinent patient test results.  I agree with the assessment, diagnosis, and plan of care documented in the resident?s note. ? ?

## 2022-12-28 ENCOUNTER — Encounter: Payer: Medicare HMO | Admitting: Student

## 2022-12-31 ENCOUNTER — Other Ambulatory Visit: Payer: Self-pay | Admitting: Internal Medicine

## 2022-12-31 DIAGNOSIS — E119 Type 2 diabetes mellitus without complications: Secondary | ICD-10-CM

## 2022-12-31 DIAGNOSIS — G8929 Other chronic pain: Secondary | ICD-10-CM

## 2022-12-31 DIAGNOSIS — E1142 Type 2 diabetes mellitus with diabetic polyneuropathy: Secondary | ICD-10-CM

## 2023-01-18 ENCOUNTER — Other Ambulatory Visit: Payer: Self-pay | Admitting: Gastroenterology

## 2023-01-18 DIAGNOSIS — Z1211 Encounter for screening for malignant neoplasm of colon: Secondary | ICD-10-CM | POA: Diagnosis not present

## 2023-01-18 DIAGNOSIS — R634 Abnormal weight loss: Secondary | ICD-10-CM

## 2023-01-18 DIAGNOSIS — R112 Nausea with vomiting, unspecified: Secondary | ICD-10-CM | POA: Diagnosis not present

## 2023-01-18 DIAGNOSIS — Z8659 Personal history of other mental and behavioral disorders: Secondary | ICD-10-CM | POA: Diagnosis not present

## 2023-01-18 DIAGNOSIS — R195 Other fecal abnormalities: Secondary | ICD-10-CM | POA: Diagnosis not present

## 2023-01-20 ENCOUNTER — Other Ambulatory Visit: Payer: Self-pay | Admitting: Internal Medicine

## 2023-01-20 DIAGNOSIS — K219 Gastro-esophageal reflux disease without esophagitis: Secondary | ICD-10-CM

## 2023-01-29 ENCOUNTER — Other Ambulatory Visit: Payer: Self-pay | Admitting: Internal Medicine

## 2023-01-29 DIAGNOSIS — E1142 Type 2 diabetes mellitus with diabetic polyneuropathy: Secondary | ICD-10-CM

## 2023-01-29 DIAGNOSIS — E119 Type 2 diabetes mellitus without complications: Secondary | ICD-10-CM

## 2023-01-29 DIAGNOSIS — G8929 Other chronic pain: Secondary | ICD-10-CM

## 2023-02-19 ENCOUNTER — Telehealth: Payer: Self-pay | Admitting: *Deleted

## 2023-02-19 NOTE — Telephone Encounter (Signed)
Appointment for 03/09/2023 mailed to patient : CT @ [facility] / [phone].

## 2023-02-23 DIAGNOSIS — D122 Benign neoplasm of ascending colon: Secondary | ICD-10-CM | POA: Diagnosis not present

## 2023-02-23 DIAGNOSIS — K644 Residual hemorrhoidal skin tags: Secondary | ICD-10-CM | POA: Diagnosis not present

## 2023-02-23 DIAGNOSIS — K209 Esophagitis, unspecified without bleeding: Secondary | ICD-10-CM | POA: Diagnosis not present

## 2023-02-23 DIAGNOSIS — K2930 Chronic superficial gastritis without bleeding: Secondary | ICD-10-CM | POA: Diagnosis not present

## 2023-02-23 DIAGNOSIS — R112 Nausea with vomiting, unspecified: Secondary | ICD-10-CM | POA: Diagnosis not present

## 2023-02-23 DIAGNOSIS — K229 Disease of esophagus, unspecified: Secondary | ICD-10-CM | POA: Diagnosis not present

## 2023-02-23 DIAGNOSIS — K648 Other hemorrhoids: Secondary | ICD-10-CM | POA: Diagnosis not present

## 2023-02-23 DIAGNOSIS — Z1211 Encounter for screening for malignant neoplasm of colon: Secondary | ICD-10-CM | POA: Diagnosis not present

## 2023-02-23 DIAGNOSIS — B3781 Candidal esophagitis: Secondary | ICD-10-CM | POA: Diagnosis not present

## 2023-02-23 DIAGNOSIS — D124 Benign neoplasm of descending colon: Secondary | ICD-10-CM | POA: Diagnosis not present

## 2023-02-23 DIAGNOSIS — K319 Disease of stomach and duodenum, unspecified: Secondary | ICD-10-CM | POA: Diagnosis not present

## 2023-03-01 ENCOUNTER — Other Ambulatory Visit: Payer: Self-pay | Admitting: Internal Medicine

## 2023-03-01 DIAGNOSIS — G8929 Other chronic pain: Secondary | ICD-10-CM

## 2023-03-01 DIAGNOSIS — E119 Type 2 diabetes mellitus without complications: Secondary | ICD-10-CM

## 2023-03-01 DIAGNOSIS — D124 Benign neoplasm of descending colon: Secondary | ICD-10-CM | POA: Diagnosis not present

## 2023-03-01 DIAGNOSIS — K319 Disease of stomach and duodenum, unspecified: Secondary | ICD-10-CM | POA: Diagnosis not present

## 2023-03-01 DIAGNOSIS — E1142 Type 2 diabetes mellitus with diabetic polyneuropathy: Secondary | ICD-10-CM

## 2023-03-01 DIAGNOSIS — K2930 Chronic superficial gastritis without bleeding: Secondary | ICD-10-CM | POA: Diagnosis not present

## 2023-03-02 ENCOUNTER — Ambulatory Visit (HOSPITAL_BASED_OUTPATIENT_CLINIC_OR_DEPARTMENT_OTHER): Admission: RE | Admit: 2023-03-02 | Payer: Medicare HMO | Source: Ambulatory Visit

## 2023-03-02 ENCOUNTER — Other Ambulatory Visit: Payer: Self-pay | Admitting: Internal Medicine

## 2023-03-02 DIAGNOSIS — E0590 Thyrotoxicosis, unspecified without thyrotoxic crisis or storm: Secondary | ICD-10-CM

## 2023-03-02 NOTE — Progress Notes (Signed)
Patient will be at clinic for AWV. With his history of subclinical hyperthyroidism would like to repeat TSH and free T4. I was not able to reach patient when I called to review recommendation to repeat blood work.

## 2023-03-03 ENCOUNTER — Ambulatory Visit (INDEPENDENT_AMBULATORY_CARE_PROVIDER_SITE_OTHER): Payer: Medicare HMO

## 2023-03-03 ENCOUNTER — Other Ambulatory Visit: Payer: Medicare HMO

## 2023-03-03 VITALS — BP 112/71 | HR 77 | Ht 72.0 in | Wt 195.5 lb

## 2023-03-03 DIAGNOSIS — Z599 Problem related to housing and economic circumstances, unspecified: Secondary | ICD-10-CM | POA: Diagnosis not present

## 2023-03-03 DIAGNOSIS — E0590 Thyrotoxicosis, unspecified without thyrotoxic crisis or storm: Secondary | ICD-10-CM | POA: Diagnosis not present

## 2023-03-03 DIAGNOSIS — N179 Acute kidney failure, unspecified: Secondary | ICD-10-CM | POA: Diagnosis not present

## 2023-03-03 DIAGNOSIS — Z0000 Encounter for general adult medical examination without abnormal findings: Secondary | ICD-10-CM

## 2023-03-03 NOTE — Patient Instructions (Signed)
Jesus Martin , Thank you for taking time to come for your Medicare Wellness Visit. I appreciate your ongoing commitment to your health goals. Please review the following plan we discussed and let me know if I can assist you in the future.   These are the goals we discussed:  Goals   None     This is a list of the screening recommended for you and due dates:  Health Maintenance  Topic Date Due   Zoster (Shingles) Vaccine (1 of 2) Never done   COVID-19 Vaccine (3 - Pfizer risk series) 05/14/2020   Colon Cancer Screening  08/12/2022   Screening for Lung Cancer  10/25/2022   Eye exam for diabetics  11/13/2022   Flu Shot  03/11/2023   Hemoglobin A1C  06/24/2023   Complete foot exam   08/13/2023   Yearly kidney health urinalysis for diabetes  09/09/2023   Yearly kidney function blood test for diabetes  12/22/2023   Medicare Annual Wellness Visit  03/02/2024   DTaP/Tdap/Td vaccine (3 - Td or Tdap) 02/04/2032   Hepatitis C Screening  Completed   HIV Screening  Completed   HPV Vaccine  Aged Out    Advanced directives: Please bring a copy of your health care power of attorney and living will to the office to be added to your chart at your convenience.   Conditions/risks identified: Each day, aim for 6 glasses of water, plenty of protein in your diet and try to get up and walk/ stretch every hour for 5-10 minutes at a time.    Next appointment: Follow up in one year for your annual wellness visit.   Preventive Care 61 Years and Older, Male  Preventive care refers to lifestyle choices and visits with your health care provider that can promote health and wellness. What does preventive care include? A yearly physical exam. This is also called an annual well check. Dental exams once or twice a year. Routine eye exams. Ask your health care provider how often you should have your eyes checked. Personal lifestyle choices, including: Daily care of your teeth and gums. Regular physical  activity. Eating a healthy diet. Avoiding tobacco and drug use. Limiting alcohol use. Practicing safe sex. Taking low doses of aspirin every day. Taking vitamin and mineral supplements as recommended by your health care provider. What happens during an annual well check? The services and screenings done by your health care provider during your annual well check will depend on your age, overall health, lifestyle risk factors, and family history of disease. Counseling  Your health care provider may ask you questions about your: Alcohol use. Tobacco use. Drug use. Emotional well-being. Home and relationship well-being. Sexual activity. Eating habits. History of falls. Memory and ability to understand (cognition). Work and work Astronomer. Screening  You may have the following tests or measurements: Height, weight, and BMI. Blood pressure. Lipid and cholesterol levels. These may be checked every 5 years, or more frequently if you are over 12 years old. Skin check. Lung cancer screening. You may have this screening every year starting at age 37 if you have a 30-pack-year history of smoking and currently smoke or have quit within the past 15 years. Fecal occult blood test (FOBT) of the stool. You may have this test every year starting at age 65. Flexible sigmoidoscopy or colonoscopy. You may have a sigmoidoscopy every 5 years or a colonoscopy every 10 years starting at age 2. Prostate cancer screening. Recommendations will vary depending on your  family history and other risks. Hepatitis C blood test. Hepatitis B blood test. Sexually transmitted disease (STD) testing. Diabetes screening. This is done by checking your blood sugar (glucose) after you have not eaten for a while (fasting). You may have this done every 1-3 years. Abdominal aortic aneurysm (AAA) screening. You may need this if you are a current or former smoker. Osteoporosis. You may be screened starting at age 28 if you are  at high risk. Talk with your health care provider about your test results, treatment options, and if necessary, the need for more tests. Vaccines  Your health care provider may recommend certain vaccines, such as: Influenza vaccine. This is recommended every year. Tetanus, diphtheria, and acellular pertussis (Tdap, Td) vaccine. You may need a Td booster every 10 years. Zoster vaccine. You may need this after age 41. Pneumococcal 13-valent conjugate (PCV13) vaccine. One dose is recommended after age 41. Pneumococcal polysaccharide (PPSV23) vaccine. One dose is recommended after age 84. Talk to your health care provider about which screenings and vaccines you need and how often you need them. This information is not intended to replace advice given to you by your health care provider. Make sure you discuss any questions you have with your health care provider. Document Released: 08/23/2015 Document Revised: 04/15/2016 Document Reviewed: 05/28/2015 Elsevier Interactive Patient Education  2017 [employer].  Fall Prevention in the Home Falls can cause injuries. They can happen to people of all ages. There are many things you can do to make your home safe and to help prevent falls. What can I do on the outside of my home? Regularly fix the edges of walkways and driveways and fix any cracks. Remove anything that might make you trip as you walk through a door, such as a raised step or threshold. Trim any bushes or trees on the path to your home. Use bright outdoor lighting. Clear any walking paths of anything that might make someone trip, such as rocks or tools. Regularly check to see if handrails are loose or broken. Make sure that both sides of any steps have handrails. Any raised decks and porches should have guardrails on the edges. Have any leaves, snow, or ice cleared regularly. Use sand or salt on walking paths during winter. Clean up any spills in your garage right away. This includes oil  or grease spills. What can I do in the bathroom? Use night lights. Install grab bars by the toilet and in the tub and shower. Do not use towel bars as grab bars. Use non-skid mats or decals in the tub or shower. If you need to sit down in the shower, use a plastic, non-slip stool. Keep the floor dry. Clean up any water that spills on the floor as soon as it happens. Remove soap buildup in the tub or shower regularly. Attach bath mats securely with double-sided non-slip rug tape. Do not have throw rugs and other things on the floor that can make you trip. What can I do in the bedroom? Use night lights. Make sure that you have a light by your bed that is easy to reach. Do not use any sheets or blankets that are too big for your bed. They should not hang down onto the floor. Have a firm chair that has side arms. You can use this for support while you get dressed. Do not have throw rugs and other things on the floor that can make you trip. What can I do in the kitchen? Clean up  any spills right away. Avoid walking on wet floors. Keep items that you use a lot in easy-to-reach places. If you need to reach something above you, use a strong step stool that has a grab bar. Keep electrical cords out of the way. Do not use floor polish or wax that makes floors slippery. If you must use wax, use non-skid floor wax. Do not have throw rugs and other things on the floor that can make you trip. What can I do with my stairs? Do not leave any items on the stairs. Make sure that there are handrails on both sides of the stairs and use them. Fix handrails that are broken or loose. Make sure that handrails are as long as the stairways. Check any carpeting to make sure that it is firmly attached to the stairs. Fix any carpet that is loose or worn. Avoid having throw rugs at the top or bottom of the stairs. If you do have throw rugs, attach them to the floor with carpet tape. Make sure that you have a light  switch at the top of the stairs and the bottom of the stairs. If you do not have them, ask someone to add them for you. What else can I do to help prevent falls? Wear shoes that: Do not have high heels. Have rubber bottoms. Are comfortable and fit you well. Are closed at the toe. Do not wear sandals. If you use a stepladder: Make sure that it is fully opened. Do not climb a closed stepladder. Make sure that both sides of the stepladder are locked into place. Ask someone to hold it for you, if possible. Clearly mark and make sure that you can see: Any grab bars or handrails. First and last steps. Where the edge of each step is. Use tools that help you move around (mobility aids) if they are needed. These include: Canes. Walkers. Scooters. Crutches. Turn on the lights when you go into a dark area. Replace any light bulbs as soon as they burn out. Set up your furniture so you have a clear path. Avoid moving your furniture around. If any of your floors are uneven, fix them. If there are any pets around you, be aware of where they are. Review your medicines with your doctor. Some medicines can make you feel dizzy. This can increase your chance of falling. Ask your doctor what other things that you can do to help prevent falls. This information is not intended to replace advice given to you by your health care provider. Make sure you discuss any questions you have with your health care provider. Document Released: 05/23/2009 Document Revised: 01/02/2016 Document Reviewed: 08/31/2014 Elsevier Interactive Patient Education  2017 [employer].

## 2023-03-03 NOTE — Progress Notes (Signed)
Subjective:   Jesus Martin is a 62 y.o. male who presents for Medicare Annual/Subsequent preventive examination.  Visit Complete: In person   Review of Systems    Cardiac Risk Factors include: advanced age (>35men, >2 women);diabetes mellitus;hypertension;Other (see comment), Risk factor comments: COPD, Neuropathy     Objective:    Today's Vitals   03/03/23 1116 03/03/23 1118  BP: 112/71   Pulse: 77   SpO2: 98%   Weight: 195 lb 8 oz (88.7 kg)   Height: 6' (1.829 m)   PainSc:  6    Body mass index is 26.51 kg/m.     03/03/2023   11:30 AM 09/28/2022   10:01 AM 09/08/2022   10:25 AM 08/12/2022    9:53 AM 06/09/2022   10:01 AM 02/17/2022   10:27 AM 02/03/2022   11:36 AM  Advanced Directives  Does Patient Have a Medical Advance Directive? Yes No No No No No No  Type of Estate agent of [place];Living will        Copy of Healthcare Power of Attorney in Chart? No - copy requested        Would patient like information on creating a medical advance directive?  No - Patient declined No - Patient declined No - Patient declined No - Patient declined No - Patient declined No - Patient declined    Current Medications (verified) Outpatient Encounter Medications as of 03/03/2023  Medication Sig   Accu-Chek FastClix Lancets MISC Check blood sugar 3 times a day.   amLODipine (NORVASC) 10 MG tablet Take 1 tablet by mouth once daily   atorvastatin (LIPITOR) 40 MG tablet TAKE 1 TABLET BY MOUTH ONCE DAILY AT  6PM   blood glucose meter kit and supplies KIT Dispense based on patient and insurance preference. Use up to four times daily as directed. (FOR ICD-9 250.00, 250.01).   Blood Glucose Monitoring Suppl (ACCU-CHEK GUIDE) w/Device KIT 1 each by Does not apply route 3 (three) times daily.   carvedilol (COREG) 12.5 MG tablet TAKE 1 TABLET BY MOUTH TWICE DAILY WITH A MEAL   diclofenac sodium (VOLTAREN) 1 % GEL Apply 2 g topically 4 (four) times daily.    Empagliflozin-metFORMIN HCl (SYNJARDY) 12-998 MG TABS Take 1 tablet by mouth twice daily   EPINEPHrine 0.3 mg/0.3 mL IJ SOAJ injection Inject 0.3 mg into the muscle once as needed (for anaphylactic reaction to bee stings).   glucose blood (ACCU-CHEK GUIDE) test strip USE 1  THREE TIMES DAILY TO CHECK BLOOD SUGAR   hydrocortisone 1 % ointment APPLY  OINTMENT EXTERNALLY TWICE DAILY   ipratropium-albuterol (DUONEB) 0.5-2.5 (3) MG/3ML SOLN Take 3 mLs by nebulization every 4 (four) hours as needed.   lamoTRIgine (LAMICTAL) 150 MG tablet Take 150 mg by mouth 2 (two) times daily.   lithium carbonate (ESKALITH) 450 MG CR tablet Take 450-675 mg by mouth See admin instructions. 450 mg   losartan-hydrochlorothiazide (HYZAAR) 100-25 MG tablet Take 1 tablet by mouth once daily   multivitamin (ONE-A-DAY MEN'S) TABS tablet Take 1 tablet by mouth daily.   omeprazole (PRILOSEC) 40 MG capsule Take 1 capsule by mouth once daily   pregabalin (LYRICA) 150 MG capsule Take 1 capsule by mouth twice daily   QUEtiapine (SEROQUEL) 50 MG tablet Take 225 mg by mouth at bedtime.   tamsulosin (FLOMAX) 0.4 MG CAPS capsule Take 1 capsule (0.4 mg total) by mouth daily.   traMADol (ULTRAM) 50 MG tablet Take 1 tablet by mouth twice daily as  needed   TRELEGY ELLIPTA 100-62.5-25 MCG/ACT AEPB INHALE 1 PUFF ONCE DAILY   VENTOLIN HFA 108 (90 Base) MCG/ACT inhaler INHALE 2 PUFFS BY MOUTH EVERY 6 HOURS AS NEEDED FOR WHEEZING OR SHORTNESS OF BREATH   No facility-administered encounter medications on file as of 03/03/2023.    Allergies (verified) Yellow jacket venom [bee venom] and 5-alpha reductase inhibitors   History: Past Medical History:  Diagnosis Date   Bipolar I disorder (HCC) 1991   Dr. Evelene Croon   Borderline hyperlipidemia    history of   Chest pain 12/12/2019   COPD (chronic obstructive pulmonary disease) (HCC)    Diabetes mellitus, new onset (HCC) 12/22/2017   Fatigue 04/16/2020   Fibromyalgia    GERD (gastroesophageal  reflux disease)    HTN (hypertension)    Lower back pain    Lumbar vertebral fracture (HCC) 01/29/2019   L1   Paranoia (HCC)    Tobacco abuse    Viral conjunctivitis of both eyes 08/14/2022   Onset 2 days ago. Eyes are itchy and painful. Pt having tearing of both eyes.    Vitamin D deficiency    Past Surgical History:  Procedure Laterality Date   APPENDECTOMY     1975   BACK SURGERY     1998   NOSE SURGERY     Family History  Adopted: Yes   Social History   Socioeconomic History   Marital status: Married    Spouse name: Carollee Herter   Number of children: 2   Years of education: Not on file   Highest education level: Not on file  Occupational History   Occupation: disabled  Tobacco Use   Smoking status: Every Day    Current packs/day: 0.75    Average packs/day: 0.8 packs/day for 45.0 years (33.8 ttl pk-yrs)    Types: Cigarettes   Smokeless tobacco: Never   Tobacco comments:    3/4 pack smoked daily ARJ 12/09/21  Vaping Use   Vaping status: Never Used  Substance and Sexual Activity   Alcohol use: Yes    Comment: Beer sometimes.   Drug use: No   Sexual activity: Not on file  Other Topics Concern   Not on file  Social History Narrative   Lives with wife Mare Ludtke (married 28 years) and son. Has 4 dogs and fish. Not employed. Woodworking for fun. HS graduate. Quit smoking in 2008. 26 pack years. No recreational drug use. Drinks 1 glass of wine per month. Does not exercise. Custody of 3 other children, 10, 11, 15.   Social Determinants of Health   Financial Resource Strain: High Risk (03/03/2023)   Overall Financial Resource Strain (CARDIA)    Difficulty of Paying Living Expenses: Hard  Food Insecurity: Food Insecurity Present (03/03/2023)   Hunger Vital Sign    Worried About Running Out of Food in the Last Year: Sometimes true    Ran Out of Food in the Last Year: Sometimes true  Transportation Needs: No Transportation Needs (03/03/2023)   PRAPARE - Therapist, art (Medical): No    Lack of Transportation (Non-Medical): No  Physical Activity: Sufficiently Active (03/03/2023)   Exercise Vital Sign    Days of Exercise per Week: 7 days    Minutes of Exercise per Session: 60 min  Stress: Stress Concern Present (03/03/2023)   [employer] of Occupational Health - Occupational Stress Questionnaire    Feeling of Stress : Rather much  Social Connections: Unknown (03/03/2023)   Social Connection and  Isolation Panel [NHANES]    Frequency of Communication with Friends and Family: Once a week    Frequency of Social Gatherings with Friends and Family: Not on file    Attends Religious Services: 1 to 4 times per year    Active Member of [employer] or Organizations: No    Attends Banker Meetings: Never    Marital Status: Married    Tobacco Counseling Ready to quit: Not Answered Counseling given: Not Answered Tobacco comments: 3/4 pack smoked daily ARJ 12/09/21   Clinical Intake:  Pre-visit preparation completed: Yes  Pain : 0-10 Pain Score: 6  Pain Type: Acute pain (per patient-neck, feet, back, knee) Pain Descriptors / Indicators: Aching, Stabbing Pain Onset: More than a month ago (per patient- had this before diabetes dx) Pain Frequency: Intermittent Pain Relieving Factors: Tylenol, Effect of Pain on Daily Activities: Walking  Pain Relieving Factors: Tylenol,  BMI - recorded: 26.51 Nutritional Status: BMI 25 -29 Overweight Nutritional Risks: Nausea/ vomitting/ diarrhea (vomiting once in awhile-per patient) Diabetes: Yes CBG done?: No Did pt. bring in CBG monitor from home?: No  How often do you need to have someone help you when you read instructions, pamphlets, or other written materials from your doctor or pharmacy?: 1 - Never  Interpreter Needed?: No  Information entered by :: Ryen Heitmeyer, RMA   Activities of Daily Living    03/03/2023   11:24 AM 12/22/2022    9:36 AM  In your present state of  health, do you have any difficulty performing the following activities:  Hearing? 1 0  Vision? 0 0  Difficulty concentrating or making decisions? 1 0  Walking or climbing stairs? 0 0  Dressing or bathing? 0 0  Doing errands, shopping? 0 0  Comment wife drives him around   Preparing Food and eating ? N   Using the Toilet? N   In the past six months, have you accidently leaked urine? Y   Do you have problems with loss of bowel control? N   Managing your Medications? N   Managing your Finances? N   Housekeeping or managing your Housekeeping? N     Patient Care Team: Masters, Florentina Addison, DO as PCP - General Henrine Screws, MD as Referring Physician (Family Medicine)  Indicate any recent Medical Services you may have received from other than [facility] providers in the past year (date may be approximate).     Assessment:   This is a routine wellness examination for Jesus Martin.  Hearing/Vision screen Hearing Screening - Comments:: Has hearing issues-per patient Vision Screening - Comments:: Denies vision issues.  Dietary issues and exercise activities discussed:     Goals Addressed   None   Depression Screen    03/03/2023   11:37 AM 12/22/2022   10:42 AM 09/28/2022   10:49 AM 09/08/2022   10:25 AM 08/12/2022   10:01 AM 06/09/2022   10:08 AM 02/17/2022   10:28 AM  PHQ 2/9 Scores  PHQ - 2 Score 3 1  6 6 4 4   PHQ- 9 Score 10 8  16 19 15 12   Exception Documentation   Patient refusal        Fall Risk    03/03/2023   11:31 AM 12/22/2022    9:36 AM 09/28/2022   10:01 AM 09/08/2022   10:17 AM 09/01/2022    9:41 AM  Fall Risk   Falls in the past year? 0 0 0 0 0  Number falls in past yr: 0 0 0 0  0  Injury with Fall? 0 0 0 0 0  Risk for fall due to : No Fall Risks;Medication side effect No Fall Risks No Fall Risks No Fall Risks   Follow up Falls prevention discussed Falls evaluation completed Falls evaluation completed;Falls prevention discussed Falls evaluation completed;Falls prevention  discussed Falls evaluation completed    MEDICARE RISK AT HOME:  Medicare Risk at Home - 03/03/23 1132     Any stairs in or around the home? Yes    If so, are there any without handrails? No    Home free of loose throw rugs in walkways, pet beds, electrical cords, etc? Yes    Adequate lighting in your home to reduce risk of falls? Yes    Life alert? No    Use of a cane, walker or w/c? No    Grab bars in the bathroom? No    Shower chair or bench in shower? No    Elevated toilet seat or a handicapped toilet? No             TIMED UP AND GO:  Was the test performed?  Yes  Length of time to ambulate 10 feet: 10 sec Gait steady and fast without use of assistive device    Cognitive Function:        03/03/2023   11:32 AM 02/03/2022   11:36 AM  6CIT Screen  What Year? 0 points 0 points  What month? 0 points 0 points  What time? 0 points 0 points  Count back from 20 0 points 0 points  Months in reverse 0 points 0 points  Repeat phrase 4 points 0 points  Total Score 4 points 0 points    Immunizations Immunization History  Administered Date(s) Administered   Influenza Split 06/24/2011   Influenza,inj,Quad PF,6+ Mos 03/28/2018, 05/08/2019, 06/12/2020, 04/21/2021   PFIZER(Purple Top)SARS-COV-2 Vaccination 03/22/2020, 04/16/2020   Pneumococcal Polysaccharide-23 09/23/2017   Tdap 06/24/2011, 02/03/2022    TDAP status: Up to date  Flu Vaccine status: Up to date  Pneumococcal vaccine status: Up to date  Covid-19 vaccine status: Completed vaccines  Qualifies for Shingles Vaccine? Yes   Zostavax completed No   Shingrix Completed?: No.    Education has been provided regarding the importance of this vaccine. Patient has been advised to call insurance company to determine out of pocket expense if they have not yet received this vaccine. Advised may also receive vaccine at local pharmacy or Health Dept. Verbalized acceptance and understanding.  Screening Tests Health  Maintenance  Topic Date Due   Zoster Vaccines- Shingrix (1 of 2) Never done   COVID-19 Vaccine (3 - Pfizer risk series) 05/14/2020   Colonoscopy  08/12/2022   Lung Cancer Screening  10/25/2022   OPHTHALMOLOGY EXAM  11/13/2022   INFLUENZA VACCINE  03/11/2023   HEMOGLOBIN A1C  06/24/2023   FOOT EXAM  08/13/2023   Diabetic kidney evaluation - Urine ACR  09/09/2023   Diabetic kidney evaluation - eGFR measurement  12/22/2023   Medicare Annual Wellness (AWV)  03/02/2024   DTaP/Tdap/Td (3 - Td or Tdap) 02/04/2032   Hepatitis C Screening  Completed   HIV Screening  Completed   HPV VACCINES  Aged Out    Health Maintenance  Health Maintenance Due  Topic Date Due   Zoster Vaccines- Shingrix (1 of 2) Never done   COVID-19 Vaccine (3 - Pfizer risk series) 05/14/2020   Colonoscopy  08/12/2022   Lung Cancer Screening  10/25/2022   OPHTHALMOLOGY EXAM  11/13/2022  Colorectal cancer screening: Type of screening: Colonoscopy. Completed 02/23/2023. Repeat every 10 years  Lung Cancer Screening: (Low Dose CT Chest recommended if Age 34-80 years, 20 pack-year currently smoking OR have quit w/in 15years.) does qualify.   Lung Cancer Screening Referral: Patient need a consultation with PCP.  Additional Screening:  Hepatitis C Screening: does qualify; Completed 09/26/2017  Vision Screening: Recommended annual ophthalmology exams for early detection of glaucoma and other disorders of the eye. Is the patient up to date with their annual eye exam?  No  Who is the provider or what is the name of the office in which the patient attends annual eye exams? Patient does not know If pt is not established with a provider, would they like to be referred to a provider to establish care? Yes .   Dental Screening: Recommended annual dental exams for proper oral hygiene  Diabetic Foot Exam: Diabetic Foot Exam: Completed 08/12/2022  Community Resource Referral / Chronic Care Management: CRR required this  visit?  Yes   CCM required this visit?  No     Plan:     I have personally reviewed and noted the following in the patient's chart:   Medical and social history Use of alcohol, tobacco or illicit drugs  Current medications and supplements including opioid prescriptions. Patient is not currently taking opioid prescriptions. Functional ability and status Nutritional status Physical activity Advanced directives List of other physicians Hospitalizations, surgeries, and ER visits in previous 12 months Vitals Screenings to include cognitive, depression, and falls Referrals and appointments  In addition, I have reviewed and discussed with patient certain preventive protocols, quality metrics, and best practice recommendations. A written personalized care plan for preventive services as well as general preventive health recommendations were provided to patient.     Deannah Rossi L Zeph Riebel, CMA   03/03/2023   After Visit Summary: (MyChart) Due to this being a telephonic visit, the after visit summary with patients personalized plan was offered to patient via MyChart   Nurse Notes: Patient is wanting to schedule a visit with PCP to discuss getting a Lung cancer screening. He is due for annual eye exam.

## 2023-03-04 ENCOUNTER — Telehealth: Payer: Self-pay | Admitting: *Deleted

## 2023-03-04 LAB — BMP8+ANION GAP
Anion Gap: 15 mmol/L (ref 10.0–18.0)
BUN: 13 mg/dL (ref 8–27)
Calcium: 10.4 mg/dL — ABNORMAL HIGH (ref 8.6–10.2)
Creatinine, Ser: 0.84 mg/dL (ref 0.76–1.27)
Glucose: 114 mg/dL — ABNORMAL HIGH (ref 70–99)
Potassium: 3.9 mmol/L (ref 3.5–5.2)
Sodium: 141 mmol/L (ref 134–144)
eGFR: 99 mL/min/{1.73_m2} (ref >59)

## 2023-03-04 LAB — T4, FREE: Free T4: 2.33 ng/dL — ABNORMAL HIGH (ref 0.82–1.77)

## 2023-03-04 LAB — TSH: TSH: <0.005 u[IU]/mL — ABNORMAL LOW (ref 0.450–4.500)

## 2023-03-04 NOTE — Progress Notes (Signed)
  Care Coordination   Note   03/04/2023 Name: Jesus Martin MRN: [id number] DOB: [birth year]  ANTONIE BORJON is a 62 y.o. year old male who sees Masters, Florentina Addison, DO for primary care. I reached out to [place] by phone today to offer care coordination services.  Mr. Bollen was given information about Care Coordination services today including:   The Care Coordination services include support from the care team which includes your Nurse Coordinator, Clinical Social Worker, or Pharmacist.  The Care Coordination team is here to help remove barriers to the health concerns and goals most important to you. Care Coordination services are voluntary, and the patient may decline or stop services at any time by request to their care team member.   Care Coordination Consent Status: Patient agreed to services and verbal consent obtained.   Follow up plan:  Telephone appointment with care coordination team member scheduled for:  03/11/23  Encounter Outcome:  Pt. Scheduled  [facility] Coordination Care Guide  Direct Dial: [phone]

## 2023-03-05 ENCOUNTER — Other Ambulatory Visit: Payer: Self-pay | Admitting: Internal Medicine

## 2023-03-05 ENCOUNTER — Telehealth: Payer: Self-pay | Admitting: Internal Medicine

## 2023-03-05 NOTE — Telephone Encounter (Signed)
Patient was contacted at the request of Dr. Sloan Leiter to schedule in person follow up appointment for lab review.  Spoke with patient's wife, Carollee Herter.  Appointment has been scheduled for next Thursday 03/11/23 at 9:45 am with Dr. Morene Crocker.

## 2023-03-08 ENCOUNTER — Other Ambulatory Visit: Payer: Medicare HMO

## 2023-03-11 ENCOUNTER — Encounter: Payer: Medicare HMO | Admitting: Student

## 2023-03-11 ENCOUNTER — Telehealth: Payer: Self-pay

## 2023-03-11 NOTE — Patient Outreach (Signed)
  Care Coordination   03/11/2023 Name: Jesus Martin MRN: [id number] DOB: [birth year]   Care Coordination Outreach Attempts:  An unsuccessful telephone outreach was attempted today to offer the patient information about available care coordination services.  Follow Up Plan:  Additional outreach attempts will be made to offer the patient care coordination information and services.   Encounter Outcome:  No Answer   Care Coordination Interventions:  No, not indicated    Juanell Fairly RN, BSN, [facility] Care Coordinator [facility]   Phone: [phone]

## 2023-03-15 ENCOUNTER — Other Ambulatory Visit: Payer: Self-pay | Admitting: Student

## 2023-03-15 NOTE — Progress Notes (Signed)
Internal Medicine Clinic Attending    I agree with the assessment, diagnosis, and plan of care documented in the AWV note.

## 2023-03-16 ENCOUNTER — Telehealth: Payer: Self-pay | Admitting: *Deleted

## 2023-03-16 NOTE — Progress Notes (Signed)
  Care Coordination Note  03/16/2023 Name: Jesus Martin MRN: [id number] DOB: 11-Jan-1961  Jesus Martin is a 62 y.o. year old male who is a primary care patient of Masters, Florentina Addison, DO and is actively engaged with the care management team. I reached out to [place] by phone today to assist with re-scheduling an initial visit with the RN Case Manager  Follow up plan: Patient declines further follow up and engagement by the care management team. Appropriate care team members and provider have been notified via electronic communication.   [facility]  Care Coordination Care Guide  Direct Dial: [phone]

## 2023-03-29 ENCOUNTER — Other Ambulatory Visit: Payer: Self-pay | Admitting: Internal Medicine

## 2023-03-29 DIAGNOSIS — E1142 Type 2 diabetes mellitus with diabetic polyneuropathy: Secondary | ICD-10-CM

## 2023-03-29 DIAGNOSIS — G8929 Other chronic pain: Secondary | ICD-10-CM

## 2023-03-29 DIAGNOSIS — E119 Type 2 diabetes mellitus without complications: Secondary | ICD-10-CM

## 2023-03-30 NOTE — Telephone Encounter (Signed)
Next appt scheduled 9/4 with Dr Welton Flakes.

## 2023-04-14 ENCOUNTER — Ambulatory Visit (INDEPENDENT_AMBULATORY_CARE_PROVIDER_SITE_OTHER): Payer: Medicare PPO | Admitting: Internal Medicine

## 2023-04-14 ENCOUNTER — Encounter: Payer: Self-pay | Admitting: Internal Medicine

## 2023-04-14 VITALS — BP 114/75 | HR 80 | Ht 72.0 in | Wt 187.9 lb

## 2023-04-14 DIAGNOSIS — E0590 Thyrotoxicosis, unspecified without thyrotoxic crisis or storm: Secondary | ICD-10-CM

## 2023-04-14 DIAGNOSIS — E039 Hypothyroidism, unspecified: Secondary | ICD-10-CM

## 2023-04-14 DIAGNOSIS — Z7984 Long term (current) use of oral hypoglycemic drugs: Secondary | ICD-10-CM

## 2023-04-14 DIAGNOSIS — Z23 Encounter for immunization: Secondary | ICD-10-CM

## 2023-04-14 DIAGNOSIS — R350 Frequency of micturition: Secondary | ICD-10-CM

## 2023-04-14 DIAGNOSIS — E785 Hyperlipidemia, unspecified: Secondary | ICD-10-CM | POA: Diagnosis not present

## 2023-04-14 DIAGNOSIS — E119 Type 2 diabetes mellitus without complications: Secondary | ICD-10-CM | POA: Diagnosis not present

## 2023-04-14 DIAGNOSIS — Z0000 Encounter for general adult medical examination without abnormal findings: Secondary | ICD-10-CM

## 2023-04-14 MED ORDER — METHIMAZOLE 10 MG PO TABS: 10.0000 mg | ORAL_TABLET | Freq: Three times a day (TID) | ORAL | 0 refills | Status: DC

## 2023-04-14 NOTE — Assessment & Plan Note (Signed)
Pt with diabetes. Last A1c was 6.4 around 4 months ago. Pt is on Synjarday 12-998 mg BID. He is tolerating this well and is adherent to the medication. Reports morning readings 120s-140s. States does not check glucose regularly. Advised that is okay as pt is not on insulin therapy or secretagogues. Will repeat A1c this visit.

## 2023-04-14 NOTE — Assessment & Plan Note (Signed)
Pt with previous subclinical hyperthyroidism but has developed hyperthyroidism based on previous lab work. His is having symptoms including mood changes that wife is reporting. She states she asked his psychiatrist to increase his medicines due to mood changes. Pt has increased energy with bouts of fatigue. He had a significant weight loss. Pt weighed 231 lb 08/2022 and is currently 187 lb. Etiology unclear at the moment as pt had normal NM uptake back in 10/2022. Graves disease is likely given pt has presence of exophthalmos on exam.  Pt is not tachycardic but on a beta blocker. Unsure if beta blocker has led to some alterations in his labs as it can prevent conversion of thyroid hormone as well. Will repeat TFTs including TRAB and T3/T4 as ratio greater than 20 is indicative of graves disease. Will initiate pt on methimazole based on his symptoms and previous labs. Will also order CBC and liver profile prior to initiation of this medication. Opthalmology referral placed. Follow up in one month. TSH and FT4 elevated as previously shown.  T3/T4 ratio is 15.7 which his less than 20 but unsure if chronic beta blocker use is contributing here.

## 2023-04-14 NOTE — Patient Instructions (Addendum)
JesusJesus Martin, it was a pleasure seeing you today! You endorsed feeling well today. Below are some of the things we talked about this visit. We look forward to seeing you in the follow up appointment!  Today we discussed: For your weight loss, we will get lab work today. I will start a medicine called methimazole, please take it three time a day. I would like to follow up in one month to see how you are doing. I will also send you to the eye doctor.   I have ordered the following labs today:   Lab Orders         TSH         TRAb (TSH Receptor Binding Antibody)         T3         T4, Free         T4         Hemoglobin A1c         Lipid Profile       Referrals ordered today:   Referral Orders  No referral(s) requested today     I have ordered the following medication/changed the following medications:   Stop the following medications: There are no discontinued medications.   Start the following medications: Meds ordered this encounter  Medications   methimazole (TAPAZOLE) 10 MG tablet    Sig: Take 1 tablet (10 mg total) by mouth 3 (three) times daily.    Dispense:  90 tablet    Refill:  0     Follow-up: 1 month follow up  Please make sure to arrive 15 minutes prior to your next appointment. If you arrive late, you may be asked to reschedule.   We look forward to seeing you next time. Please call our clinic at [phone] if you have any questions or concerns. The best time to call is Monday-Friday from 9am-4pm, but there is someone available 24/7. If after hours or the weekend, call the main hospital number and ask for the Internal Medicine Resident On-Call. If you need medication refills, please notify your pharmacy one week in advance and they will send [place] a request.  Thank you for letting us take part in your care. Wishing you the best!  Thank you, Gwenevere Abbot, MD

## 2023-04-14 NOTE — Progress Notes (Unsigned)
CC: follow up on hyperthyroid symptoms  HPI:  [name] is a 62 y.o. with medical history of HTN, DMII, Bipolar disorder, COPD, subclinical hyperthyroidism presenting to [facility] for follow up on lab abnormalities and his symptoms of hyperthyroidism.   Please see problem-based list for further details, assessments, and plans.  Past Medical History:  Diagnosis Date   Bipolar I disorder (HCC) 1991   Dr. Evelene Croon   Borderline hyperlipidemia    history of   Chest pain 12/12/2019   COPD (chronic obstructive pulmonary disease) (HCC)    Diabetes mellitus, new onset (HCC) 12/22/2017   Fatigue 04/16/2020   Fibromyalgia    GERD (gastroesophageal reflux disease)    HTN (hypertension)    Lower back pain    Lumbar vertebral fracture (HCC) 01/29/2019   L1   Paranoia (HCC)    Tobacco abuse    Viral conjunctivitis of both eyes 08/14/2022   Onset 2 days ago. Eyes are itchy and painful. Pt having tearing of both eyes.    Vitamin D deficiency     Current Outpatient Medications (Endocrine & Metabolic):    methimazole (TAPAZOLE) 10 MG tablet, Take 1 tablet (10 mg total) by mouth 3 (three) times daily.   Empagliflozin-metFORMIN HCl (SYNJARDY) 12-998 MG TABS, Take 1 tablet by mouth twice daily  Current Outpatient Medications (Cardiovascular):    amLODipine (NORVASC) 10 MG tablet, Take 1 tablet by mouth once daily   atorvastatin (LIPITOR) 40 MG tablet, TAKE 1 TABLET BY MOUTH ONCE DAILY AT  6PM   carvedilol (COREG) 12.5 MG tablet, TAKE 1 TABLET BY MOUTH TWICE DAILY WITH A MEAL   EPINEPHrine 0.3 mg/0.3 mL IJ SOAJ injection, Inject 0.3 mg into the muscle once as needed (for anaphylactic reaction to bee stings).   losartan-hydrochlorothiazide (HYZAAR) 100-25 MG tablet, Take 1 tablet by mouth once daily  Current Outpatient Medications (Respiratory):    ipratropium-albuterol (DUONEB) 0.5-2.5 (3) MG/3ML SOLN, Take 3 mLs by nebulization every 4 (four) hours as needed.   TRELEGY ELLIPTA 100-62.5-25  MCG/ACT AEPB, INHALE 1 PUFF ONCE DAILY   VENTOLIN HFA 108 (90 Base) MCG/ACT inhaler, INHALE 2 PUFFS BY MOUTH EVERY 6 HOURS AS NEEDED FOR WHEEZING OR SHORTNESS OF BREATH  Current Outpatient Medications (Analgesics):    traMADol (ULTRAM) 50 MG tablet, Take 1 tablet by mouth twice daily as needed   Current Outpatient Medications (Other):    Accu-Chek FastClix Lancets MISC, Check blood sugar 3 times a day.   blood glucose meter kit and supplies KIT, Dispense based on patient and insurance preference. Use up to four times daily as directed. (FOR ICD-9 250.00, 250.01).   Blood Glucose Monitoring Suppl (ACCU-CHEK GUIDE) w/Device KIT, 1 each by Does not apply route 3 (three) times daily.   diclofenac sodium (VOLTAREN) 1 % GEL, Apply 2 g topically 4 (four) times daily.   glucose blood (ACCU-CHEK GUIDE) test strip, USE 1  THREE TIMES DAILY TO CHECK BLOOD SUGAR   hydrocortisone 1 % ointment, APPLY  OINTMENT EXTERNALLY TWICE DAILY   lamoTRIgine (LAMICTAL) 150 MG tablet, Take 150 mg by mouth 2 (two) times daily.   multivitamin (ONE-A-DAY MEN'S) TABS tablet, Take 1 tablet by mouth daily.   omeprazole (PRILOSEC) 40 MG capsule, Take 1 capsule by mouth once daily   pregabalin (LYRICA) 150 MG capsule, Take 1 capsule by mouth twice daily   QUEtiapine (SEROQUEL) 50 MG tablet, Take 225 mg by mouth at bedtime.   tamsulosin (FLOMAX) 0.4 MG CAPS capsule, Take 1 capsule (0.4  mg total) by mouth daily.  Review of Systems:  Review of system negative unless stated in the problem list or HPI.    Physical Exam:  Vitals:   04/14/23 1019  BP: 114/75  Pulse: 80  SpO2: 99%  Weight: 187 lb 14.4 oz (85.2 kg)  Height: 6' (1.829 m)   Physical Exam General: NAD HENT: eyes protruding bilaterally Lungs: CTAB, no wheeze, rhonchi or rales.  Cardiovascular: Normal heart sounds, no r/m/g, 2+ pulses in all extremities. No LE edema Abdomen: No TTP, normal bowel sounds MSK: No asymmetry or muscle atrophy.  Skin: no lesions  noted on exposed skin Neuro: Alert and oriented x4. CN grossly intact Psych: Normal mood and normal affect   Assessment & Plan:   Urinary frequency Pt's symptoms resolved with flomax.   Diabetes (HCC) Pt with diabetes. Last A1c was 6.4 around 4 months ago. Pt is on Synjarday 12-998 mg BID. He is tolerating this well and is adherent to the medication. Reports morning readings 120s-140s. States does not check glucose regularly. Advised that is okay as pt is not on insulin therapy or secretagogues. Will repeat A1c this visit.    Hyperlipidemia Pt has hx of HLD and is on lipitor 40 mg every day. Last lipid panel one year ago. Goal is primary prevention. Will get repeat lipid panel with current blood work.   Hyperthyroidism Pt with previous subclinical hyperthyroidism but has developed hyperthyroidism based on previous lab work. His is having symptoms including mood changes that wife is reporting. She states she asked his psychiatrist to increase his medicines due to mood changes. Pt has increased energy with bouts of fatigue. He had a significant weight loss. Pt weighed 231 lb 08/2022 and is currently 187 lb. Etiology unclear at the moment as pt had normal NM uptake back in 10/2022. Graves disease is likely given pt has presence of exophthalmos on exam.  Pt is not tachycardic but on a beta blocker. Unsure if beta blocker has led to some alterations in his labs as it can prevent conversion of thyroid hormone as well. Will repeat TFTs including TRAB and T3/T4 as ratio greater than 20 is indicative of graves disease. Will initiate pt on methimazole based on his symptoms and previous labs. Will also order CBC and liver profile prior to initiation of this medication. Opthalmology referral placed. Follow up in one month.    See Encounters Tab for problem based charting.  Patient Discussed with Dr. Mauri Pole, MD Eligha Bridegroom. [facility], PGY-3

## 2023-04-14 NOTE — Assessment & Plan Note (Signed)
Pt has hx of HLD and is on lipitor 40 mg every day. Last lipid panel one year ago. Goal is primary prevention. Will get repeat lipid panel with current blood work. LDL was 47 so will continue current measures.

## 2023-04-14 NOTE — Assessment & Plan Note (Signed)
Pt's symptoms resolved with flomax.

## 2023-04-15 LAB — CBC WITH DIFFERENTIAL/PLATELET
Basophils Absolute: 0 10*3/uL (ref 0.0–0.2)
Basos: 0 %
EOS (ABSOLUTE): 0.3 10*3/uL (ref 0.0–0.4)
Eos: 4 %
Hematocrit: 38.5 % (ref 37.5–51.0)
Hemoglobin: 13.3 g/dL (ref 13.0–17.7)
Immature Grans (Abs): 0 10*3/uL (ref 0.0–0.1)
Immature Granulocytes: 0 %
Lymphocytes Absolute: 3.1 10*3/uL (ref 0.7–3.1)
Lymphs: 46 %
MCH: 31.7 pg (ref 26.6–33.0)
MCHC: 34.5 g/dL (ref 31.5–35.7)
MCV: 92 fL (ref 79–97)
Monocytes Absolute: 0.6 10*3/uL (ref 0.1–0.9)
Monocytes: 9 %
Neutrophils Absolute: 2.8 10*3/uL (ref 1.4–7.0)
Neutrophils: 41 %
Platelets: 259 10*3/uL (ref 150–450)
RBC: 4.19 x10E6/uL (ref 4.14–5.80)
RDW: 12.4 % (ref 11.6–15.4)
WBC: 6.8 10*3/uL (ref 3.4–10.8)

## 2023-04-15 LAB — HEPATIC FUNCTION PANEL
ALT: 21 IU/L (ref 0–44)
AST: 17 IU/L (ref 0–40)
Albumin: 4.3 g/dL (ref 3.9–4.9)
Alkaline Phosphatase: 82 IU/L (ref 44–121)
Bilirubin Total: 0.3 mg/dL (ref 0.0–1.2)
Bilirubin, Direct: 0.14 mg/dL (ref 0.00–0.40)
Total Protein: 6.7 g/dL (ref 6.0–8.5)

## 2023-04-15 LAB — TSH: TSH: <0.005 u[IU]/mL — ABNORMAL LOW (ref 0.450–4.500)

## 2023-04-15 LAB — LIPID PANEL
Chol/HDL Ratio: 2.5 ratio (ref 0.0–5.0)
Cholesterol, Total: 126 mg/dL (ref 100–199)
HDL: 51 mg/dL (ref >39)
LDL Chol Calc (NIH): 47 mg/dL (ref 0–99)
Triglycerides: 172 mg/dL — ABNORMAL HIGH (ref 0–149)
VLDL Cholesterol Cal: 28 mg/dL (ref 5–40)

## 2023-04-15 LAB — T3: T3, Total: 142 ng/dL (ref 71–180)

## 2023-04-15 LAB — HEMOGLOBIN A1C
Est. average glucose Bld gHb Est-mCnc: 123 mg/dL
Hgb A1c MFr Bld: 5.9 % — ABNORMAL HIGH (ref 4.8–5.6)

## 2023-04-15 LAB — T4, FREE: Free T4: 2.28 ng/dL — ABNORMAL HIGH (ref 0.82–1.77)

## 2023-04-15 LAB — THYROTROPIN RECEPTOR AUTOABS: Thyrotropin Receptor Ab: <1.1 IU/L (ref 0.00–1.75)

## 2023-04-15 LAB — T4: T4, Total: 9 ug/dL (ref 4.5–12.0)

## 2023-04-15 NOTE — Progress Notes (Signed)
Internal Medicine Clinic Attending  Case discussed with the resident physician at the time of the visit.  We reviewed the patient's history, exam, and pertinent patient test results.  I agree with the assessment, diagnosis, and plan of care documented in the resident's note.   Symptomatic T4 hyperthyroidism that we will treat with methimazole and beta blocker. Trab is negative, but if ophtho can confirm that he is having exophthalmos then we will know this is Graves. He had an uptake and scan in March that did not have a nodule, but he was also not hyperthyroid at that time. We can also refer him to endocrine to discuss definitive treatments.

## 2023-04-26 ENCOUNTER — Other Ambulatory Visit: Payer: Self-pay | Admitting: Internal Medicine

## 2023-04-26 DIAGNOSIS — E119 Type 2 diabetes mellitus without complications: Secondary | ICD-10-CM

## 2023-04-26 DIAGNOSIS — G8929 Other chronic pain: Secondary | ICD-10-CM

## 2023-04-26 DIAGNOSIS — E1142 Type 2 diabetes mellitus with diabetic polyneuropathy: Secondary | ICD-10-CM

## 2023-05-11 ENCOUNTER — Other Ambulatory Visit: Payer: Self-pay | Admitting: Internal Medicine

## 2023-05-11 DIAGNOSIS — E0590 Thyrotoxicosis, unspecified without thyrotoxic crisis or storm: Secondary | ICD-10-CM

## 2023-05-11 NOTE — Telephone Encounter (Signed)
Next appt scheduled 10/4 with Dr Geraldo Pitter.

## 2023-05-14 ENCOUNTER — Encounter: Payer: Self-pay | Admitting: Student

## 2023-05-14 ENCOUNTER — Ambulatory Visit (INDEPENDENT_AMBULATORY_CARE_PROVIDER_SITE_OTHER): Payer: Medicare PPO | Admitting: Student

## 2023-05-14 VITALS — BP 124/69 | HR 73 | Temp 98.4°F | Wt 188.2 lb

## 2023-05-14 DIAGNOSIS — M1990 Unspecified osteoarthritis, unspecified site: Secondary | ICD-10-CM | POA: Diagnosis not present

## 2023-05-14 DIAGNOSIS — G8929 Other chronic pain: Secondary | ICD-10-CM | POA: Diagnosis not present

## 2023-05-14 DIAGNOSIS — E0590 Thyrotoxicosis, unspecified without thyrotoxic crisis or storm: Secondary | ICD-10-CM | POA: Diagnosis not present

## 2023-05-14 DIAGNOSIS — I10 Essential (primary) hypertension: Secondary | ICD-10-CM

## 2023-05-14 MED ORDER — METHOCARBAMOL 750 MG PO TABS: 750.0000 mg | ORAL_TABLET | Freq: Four times a day (QID) | ORAL | 0 refills | Status: DC | PRN

## 2023-05-14 NOTE — Patient Instructions (Addendum)
  Thank you, Jesus Martin, for allowing [place] to provide your care today.  We are going to retest your thyroid function today and I will call you with results and let you know if we need to change the dose of your methimazole but for now continue to take your current dose.  We will also send some lab work to check for rheumatoid arthritis and I will let you know what the results are of that.  For your pain I would like you to take the medications listed below and be careful not to take more than this as it can be dangerous.  Tylenol 1000 or 1300 mg every 8 hours Robaxin-750 milligrams every 6 hours as needed Ibuprofen 400 mg every 4-6 hours or 600-800 mg every 8 hours as needed Use lidocaine patches and Voltaren gel as needed for pain as well as stretching and a heating pad   I have ordered the following labs for you:  Lab Orders         TSH         T4, Free         T3         Rheumatoid (RA) Factor         CYCLIC CITRUL PEPTIDE ANTIBODY, IGG/IGA       Follow up:  1-2 Months     Remember:     Should you have any questions or concerns please call the internal medicine clinic at [phone].     Rocky Morel, DO [facility]

## 2023-05-14 NOTE — Progress Notes (Unsigned)
CC: Routine Follow Up   HPI:  Jesus Martin is a 62 y.o. male with pertinent PMH of hypothyroidism, HTN, COPD, T2DM, bipolar type I, and has hepatic steatosis who presents to the clinic for follow-up of hyperthyroidism after initiating methimazole on 04/14/2023. Please see assessment and plan below for further details.  Past Medical History:  Diagnosis Date   Bipolar I disorder (HCC) 1991   Dr. Evelene Croon   Borderline hyperlipidemia    history of   Chest pain 12/12/2019   COPD (chronic obstructive pulmonary disease) (HCC)    Diabetes mellitus, new onset (HCC) 12/22/2017   Fatigue 04/16/2020   Fibromyalgia    GERD (gastroesophageal reflux disease)    HTN (hypertension)    Lower back pain    Lumbar vertebral fracture (HCC) 01/29/2019   L1   Paranoia (HCC)    Tobacco abuse    Viral conjunctivitis of both eyes 08/14/2022   Onset 2 days ago. Eyes are itchy and painful. Pt having tearing of both eyes.    Vitamin D deficiency     Current Outpatient Medications  Medication Instructions   Accu-Chek FastClix Lancets MISC Check blood sugar 3 times a day.   amLODipine (NORVASC) 10 MG tablet Take 1 tablet by mouth once daily   atorvastatin (LIPITOR) 40 MG tablet TAKE 1 TABLET BY MOUTH ONCE DAILY AT  6PM   blood glucose meter kit and supplies KIT Dispense based on patient and insurance preference. Use up to four times daily as directed. (FOR ICD-9 250.00, 250.01).   Blood Glucose Monitoring Suppl (ACCU-CHEK GUIDE) w/Device KIT 1 each, Does not apply, 3 times daily   carvedilol (COREG) 12.5 mg, Oral, 2 times daily with meals   diclofenac sodium (VOLTAREN) 2 g, Topical, 4 times daily   Empagliflozin-metFORMIN HCl (SYNJARDY) 12-998 MG TABS 1 tablet, Oral, 2 times daily   EPINEPHrine (EPI-PEN) 0.3 mg, Intramuscular, Once PRN   glucose blood (ACCU-CHEK GUIDE) test strip USE 1  THREE TIMES DAILY TO CHECK BLOOD SUGAR   hydrocortisone 1 % ointment APPLY  OINTMENT EXTERNALLY TWICE DAILY    ipratropium-albuterol (DUONEB) 0.5-2.5 (3) MG/3ML SOLN 3 mLs, Nebulization, Every 4 hours PRN   lamoTRIgine (LAMICTAL) 150 mg, Oral, 2 times daily   losartan-hydrochlorothiazide (HYZAAR) 100-25 MG tablet 1 tablet, Oral, Daily   methimazole (TAPAZOLE) 10 mg, Oral, 3 times daily   methocarbamol (ROBAXIN-750) 750 mg, Oral, Every 6 hours PRN   multivitamin (ONE-A-DAY MEN'S) TABS tablet 1 tablet, Oral, Daily   omeprazole (PRILOSEC) 40 MG capsule Take 1 capsule by mouth once daily   pregabalin (LYRICA) 150 mg, Oral, 2 times daily   QUEtiapine (SEROQUEL) 225 mg, Oral, Daily at bedtime   tamsulosin (FLOMAX) 0.4 mg, Oral, Daily   traMADol (ULTRAM) 50 mg, Oral, 2 times daily PRN   TRELEGY ELLIPTA 100-62.5-25 MCG/ACT AEPB INHALE 1 PUFF ONCE DAILY   VENTOLIN HFA 108 (90 Base) MCG/ACT inhaler INHALE 2 PUFFS BY MOUTH EVERY 6 HOURS AS NEEDED FOR WHEEZING OR SHORTNESS OF BREATH     Review of Systems:   Pertinent items noted in HPI and/or A&P.  Physical Exam:  Vitals:   05/14/23 1014  BP: 124/69  Pulse: 73  Temp: 98.4 F (36.9 C)  TempSrc: Oral  SpO2: 98%  Weight: 188 lb 3.2 oz (85.4 kg)    Constitutional: Slightly anxious but well-appearing elderly male. In no acute distress. HEENT: Normocephalic, atraumatic, Sclera non-icteric, PERRL, EOM intact Cardio:Regular rate and rhythm. 2+ bilateral radial pulses. Pulm: Normal work  of breathing on room air. MSK: Mild swelling and tenderness of bilateral hand joints most prominent at the MCPs with mild ulnar deviation of the fingers on both hands.  No significant overlying skin changes. Skin: Several small superficial wounds on his bilateral upper extremities in various stages of healing without signs of infection, underlying inflammation, surrounding rash, purpura. Neuro:Alert and oriented x3. No focal deficit noted.     Assessment & Plan:   Hypertension Blood pressure at goal today at 124/69. - Continue amlodipine 10 mg daily, carvedilol 12.5  mg twice daily, losartan/HCTZ 100/25 mg daily  Hyperthyroidism Initiated on methimazole 10 mg 3 times daily at previous visit on 9//2024.  Since then patient has a mild improvement in symptoms including less anxiety and stable soft bowel movements that occur 3-4 times a day.  He does have some small superficial wounds in various stages of healing on his arms not have not been healing as quickly as usual but there is no sign of purpura or vasculitis that can rarely be associated with methimazole.  We will continue to monitor his symptoms and thyroid labs with knowledge that the TSH may lag behind. - Repeat TSH, free T4, total T3 and titrate methimazole based on results - Referral to ophthalmology and endocrinology  Chronic pain Patient continues to have chronic pain but does have some acute concern of pain over his right trapezius.  History and physical consistent with muscular pain without signs of muscle or tendon tear.  He is currently taking 3-6 g of Tylenol twice a day and 1000 to 1200 mg of ibuprofen twice a day.  We discussed the risk of serious side effects associated with these medications and doses.  Fortunately at this moment he does not have signs of a side effects manifesting. - Tylenol 1000 mg 3 times daily, Robaxin-750 milligrams every 6 hours as needed, ibuprofen 400-600 every 6-8 hours as needed, heating pad, stretches, lidocaine patches, Voltaren gel  Arthritis Today patient notes localization of a lot of his chronic pain to bilateral hands.  Pain is primarily in the MCP joints and phalanges with some associated swelling of these joints.  He does not note any significant morning stiffness.  On exam there are some signs suggestive of synovitis with mild swelling of the MCP joints and mild tenderness with palpation of these joints.  There is also some mild ulnar deviation of the fingers on both hands.  He has not been tested for rheumatoid arthritis in the past so we will evaluate for this  today. - Rheumatoid factor and CCP, see chronic pain for pain medication recommendations    Patient discussed with Dr. Duwayne Heck, DO [facility] Resident PGY-2 Clinic Phone: [phone] Pager: [phone]

## 2023-05-16 LAB — TSH: TSH: <0.005 u[IU]/mL — ABNORMAL LOW (ref 0.450–4.500)

## 2023-05-16 LAB — T4, FREE: Free T4: 1.74 ng/dL (ref 0.82–1.77)

## 2023-05-16 LAB — T3: T3, Total: 130 ng/dL (ref 71–180)

## 2023-05-16 LAB — CYCLIC CITRUL PEPTIDE ANTIBODY, IGG/IGA: Cyclic Citrullin Peptide Ab: 10 U (ref 0–19)

## 2023-05-16 LAB — RHEUMATOID FACTOR: Rheumatoid fact SerPl-aCnc: <10 [IU]/mL (ref <14.0)

## 2023-05-18 DIAGNOSIS — M1990 Unspecified osteoarthritis, unspecified site: Secondary | ICD-10-CM | POA: Insufficient documentation

## 2023-05-18 NOTE — Assessment & Plan Note (Addendum)
Initiated on methimazole 10 mg 3 times daily at previous visit on 9//2024.  Since then patient has a mild improvement in symptoms including less anxiety and stable soft bowel movements that occur 3-4 times a day.  He does have some small superficial wounds in various stages of healing on his arms not have not been healing as quickly as usual but there is no sign of purpura or vasculitis that can rarely be associated with methimazole.  We will continue to monitor his symptoms and thyroid labs with knowledge that the TSH may lag behind. - Repeat TSH, free T4, total T3 and titrate methimazole based on results - Referral to ophthalmology and endocrinology

## 2023-05-18 NOTE — Assessment & Plan Note (Signed)
Patient continues to have chronic pain but does have some acute concern of pain over his right trapezius.  History and physical consistent with muscular pain without signs of muscle or tendon tear.  He is currently taking 3-6 g of Tylenol twice a day and 1000 to 1200 mg of ibuprofen twice a day.  We discussed the risk of serious side effects associated with these medications and doses.  Fortunately at this moment he does not have signs of a side effects manifesting. - Tylenol 1000 mg 3 times daily, Robaxin-750 milligrams every 6 hours as needed, ibuprofen 400-600 every 6-8 hours as needed, heating pad, stretches, lidocaine patches, Voltaren gel

## 2023-05-18 NOTE — Assessment & Plan Note (Signed)
Today patient notes localization of a lot of his chronic pain to bilateral hands.  Pain is primarily in the MCP joints and phalanges with some associated swelling of these joints.  He does not note any significant morning stiffness.  On exam there are some signs suggestive of synovitis with mild swelling of the MCP joints and mild tenderness with palpation of these joints.  There is also some mild ulnar deviation of the fingers on both hands.  He has not been tested for rheumatoid arthritis in the past so we will evaluate for this today. - Rheumatoid factor and CCP, see chronic pain for pain medication recommendations

## 2023-05-18 NOTE — Assessment & Plan Note (Signed)
Blood pressure at goal today at 124/69. - Continue amlodipine 10 mg daily, carvedilol 12.5 mg twice daily, losartan/HCTZ 100/25 mg daily

## 2023-05-24 ENCOUNTER — Other Ambulatory Visit: Payer: Self-pay | Admitting: Internal Medicine

## 2023-05-24 DIAGNOSIS — E1142 Type 2 diabetes mellitus with diabetic polyneuropathy: Secondary | ICD-10-CM

## 2023-05-24 DIAGNOSIS — G8929 Other chronic pain: Secondary | ICD-10-CM

## 2023-05-24 DIAGNOSIS — E119 Type 2 diabetes mellitus without complications: Secondary | ICD-10-CM

## 2023-05-24 DIAGNOSIS — R350 Frequency of micturition: Secondary | ICD-10-CM

## 2023-05-24 NOTE — Progress Notes (Signed)
Called and discussed lab results with patient's wife verifying his date of birth and was present during his last office visit.  TSH remains low but T4 and T3 are within normal limits consistent with good response to current methimazole dose.  Rheumatoid factor and CCP antibody negative which is reassuring for lower likelihood of rheumatoid arthritis.  We will reevaluate at next visit on 06/25/2023.

## 2023-05-25 ENCOUNTER — Other Ambulatory Visit: Payer: Self-pay | Admitting: Internal Medicine

## 2023-05-25 NOTE — Telephone Encounter (Signed)
Next appt scheduled 11/15 with PCP.

## 2023-05-25 NOTE — Telephone Encounter (Signed)
PDMP reviewed and appropriate

## 2023-05-31 NOTE — Progress Notes (Signed)
Internal Medicine Clinic Attending  Case discussed with the resident at the time of the visit.  We reviewed the resident's history and exam and pertinent patient test results.  I agree with the assessment, diagnosis, and plan of care documented in the resident's note.  

## 2023-06-23 ENCOUNTER — Other Ambulatory Visit: Payer: Self-pay | Admitting: Internal Medicine

## 2023-06-23 DIAGNOSIS — E119 Type 2 diabetes mellitus without complications: Secondary | ICD-10-CM

## 2023-06-23 DIAGNOSIS — G8929 Other chronic pain: Secondary | ICD-10-CM

## 2023-06-23 DIAGNOSIS — E1142 Type 2 diabetes mellitus with diabetic polyneuropathy: Secondary | ICD-10-CM

## 2023-06-25 ENCOUNTER — Encounter: Payer: Self-pay | Admitting: Internal Medicine

## 2023-06-25 ENCOUNTER — Other Ambulatory Visit: Payer: Self-pay | Admitting: Internal Medicine

## 2023-06-25 ENCOUNTER — Ambulatory Visit (INDEPENDENT_AMBULATORY_CARE_PROVIDER_SITE_OTHER): Payer: Medicare HMO | Admitting: Internal Medicine

## 2023-06-25 VITALS — BP 109/79 | HR 70 | Temp 97.0°F | Wt 198.5 lb

## 2023-06-25 DIAGNOSIS — E0590 Thyrotoxicosis, unspecified without thyrotoxic crisis or storm: Secondary | ICD-10-CM | POA: Diagnosis not present

## 2023-06-25 DIAGNOSIS — I10 Essential (primary) hypertension: Secondary | ICD-10-CM

## 2023-06-25 NOTE — Assessment & Plan Note (Signed)
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 He was started on methimazole 10 mg TID in 9/24 for this. Patient reports that he actually self decreased methimazole from TID dosing to BID dosing in early October as he felt like he had zero energy. Prior evaluation with NM uptake scan in 3/24 showed no nodules, however this was when labs were consistent with subclinical hyperthyroidism. TRAB negative 9/24.  A: Likely graves, I do not appreciate exopthalmos or goiter on exam today. P: TSH, freeT4, T3 If free T4 and T3 wnl would consider decreasing by 30-50% Current dose with methimazole 10 mg BID  Addendum 11/18: Free T4, T3 low with normal TSH Decreased methimazole from BID to every day dosing

## 2023-06-25 NOTE — Progress Notes (Signed)
Subjective:  CC: thyroid  HPI:  [name] is a 62 y.o. male with a past medical history bipolar type 1, COPD, and diabetes who presents today for follow-up on hyperthyroidism.   Please see problem based assessment and plan for additional details.  Past Medical History:  Diagnosis Date   Bipolar I disorder [facility]) 1991   Dr. Evelene Croon   Borderline hyperlipidemia    history of   COPD (chronic obstructive pulmonary disease) (HCC)    Diabetes mellitus, new onset (HCC) 12/22/2017   Fibromyalgia    GERD (gastroesophageal reflux disease)    HTN (hypertension)    Lumbar vertebral fracture (HCC) 01/29/2019   L1   Tobacco abuse    Viral conjunctivitis of both eyes 08/14/2022   Onset 2 days ago. Eyes are itchy and painful. Pt having tearing of both eyes.    Vitamin D deficiency     Current Outpatient Medications on File Prior to Visit  Medication Sig Dispense Refill   Accu-Chek FastClix Lancets MISC Check blood sugar 3 times a day. 102 each 11   amLODipine (NORVASC) 10 MG tablet Take 1 tablet by mouth once daily 90 tablet 3   atorvastatin (LIPITOR) 40 MG tablet TAKE 1 TABLET BY MOUTH ONCE DAILY AT  6PM 90 tablet 3   blood glucose meter kit and supplies KIT Dispense based on patient and insurance preference. Use up to four times daily as directed. (FOR ICD-9 250.00, 250.01). 1 each 0   Blood Glucose Monitoring Suppl (ACCU-CHEK GUIDE) w/Device KIT 1 each by Does not apply route 3 (three) times daily. 1 kit 1   diclofenac sodium (VOLTAREN) 1 % GEL Apply 2 g topically 4 (four) times daily. 350 g 6   Empagliflozin-metFORMIN HCl (SYNJARDY) 12-998 MG TABS Take 1 tablet by mouth twice daily 180 tablet 3   EPINEPHrine 0.3 mg/0.3 mL IJ SOAJ injection Inject 0.3 mg into the muscle once as needed (for anaphylactic reaction to bee stings).     Fluticasone-Umeclidin-Vilant (TRELEGY ELLIPTA) 100-62.5-25 MCG/ACT AEPB Inhale 1 puff by mouth once daily 60 each 3   glucose blood (ACCU-CHEK GUIDE)  test strip USE 1  THREE TIMES DAILY TO CHECK BLOOD SUGAR 100 each 9   hydrocortisone 1 % ointment APPLY  OINTMENT EXTERNALLY TWICE DAILY 29 g 0   ipratropium-albuterol (DUONEB) 0.5-2.5 (3) MG/3ML SOLN Take 3 mLs by nebulization every 4 (four) hours as needed. 360 mL 1   lamoTRIgine (LAMICTAL) 150 MG tablet Take 150 mg by mouth 2 (two) times daily.     losartan-hydrochlorothiazide (HYZAAR) 100-25 MG tablet Take 1 tablet by mouth once daily 90 tablet 3   methocarbamol (ROBAXIN-750) 750 MG tablet Take 1 tablet (750 mg total) by mouth every 6 (six) hours as needed for muscle spasms. 60 tablet 0   multivitamin (ONE-A-DAY MEN'S) TABS tablet Take 1 tablet by mouth daily.     omeprazole (PRILOSEC) 40 MG capsule Take 1 capsule by mouth once daily 90 capsule 3   pregabalin (LYRICA) 150 MG capsule Take 1 capsule by mouth twice daily 60 capsule 0   QUEtiapine (SEROQUEL) 50 MG tablet Take 225 mg by mouth at bedtime.     tamsulosin (FLOMAX) 0.4 MG CAPS capsule Take 1 capsule by mouth once daily 30 capsule 0   traMADol (ULTRAM) 50 MG tablet Take 1 tablet by mouth twice daily as needed 60 tablet 0   VENTOLIN HFA 108 (90 Base) MCG/ACT inhaler INHALE 2 PUFFS BY MOUTH EVERY 6 HOURS AS  NEEDED FOR WHEEZING OR SHORTNESS OF BREATH 18 g 3   No current facility-administered medications on file prior to visit.    Family History  Adopted: Yes    Social History   Socioeconomic History   Marital status: Married    Spouse name: Carollee Herter   Number of children: 2   Years of education: Not on file   Highest education level: Not on file  Occupational History   Occupation: disabled  Tobacco Use   Smoking status: Every Day    Current packs/day: 0.75    Average packs/day: 0.8 packs/day for 45.0 years (33.8 ttl pk-yrs)    Types: Cigarettes   Smokeless tobacco: Never   Tobacco comments:    3/4 pack smoked daily ARJ 12/09/21  Vaping Use   Vaping status: Never Used  Substance and Sexual Activity   Alcohol use: Yes     Comment: Beer sometimes.   Drug use: No   Sexual activity: Not on file  Other Topics Concern   Not on file  Social History Narrative   Lives with wife Jesus Martin (married 28 years) and son. Has 4 dogs and fish. Not employed. Woodworking for fun. HS graduate. Quit smoking in 2008. 26 pack years. No recreational drug use. Drinks 1 glass of wine per month. Does not exercise. Custody of 3 other children, 10, 11, 15.   Social Determinants of Health   Financial Resource Strain: High Risk (03/03/2023)   Overall Financial Resource Strain (CARDIA)    Difficulty of Paying Living Expenses: Hard  Food Insecurity: Food Insecurity Present (03/03/2023)   Hunger Vital Sign    Worried About Running Out of Food in the Last Year: Sometimes true    Ran Out of Food in the Last Year: Sometimes true  Transportation Needs: No Transportation Needs (03/03/2023)   PRAPARE - Administrator, Civil Service (Medical): No    Lack of Transportation (Non-Medical): No  Physical Activity: Sufficiently Active (03/03/2023)   Exercise Vital Sign    Days of Exercise per Week: 7 days    Minutes of Exercise per Session: 60 min  Stress: Stress Concern Present (03/03/2023)   [employer] of Occupational Health - Occupational Stress Questionnaire    Feeling of Stress : Rather much  Social Connections: Unknown (03/03/2023)   Social Connection and Isolation Panel [NHANES]    Frequency of Communication with Friends and Family: Once a week    Frequency of Social Gatherings with Friends and Family: Not on file    Attends Religious Services: 1 to 4 times per year    Active Member of [employer] or Organizations: No    Attends Banker Meetings: Never    Marital Status: Married  Catering manager Violence: Not At Risk (03/03/2023)   Humiliation, Afraid, Rape, and Kick questionnaire    Fear of Current or Ex-Partner: No    Emotionally Abused: No    Physically Abused: No    Sexually Abused: No    Review of  Systems: ROS negative except for what is noted on the assessment and plan.  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 He was started on methimazole 10 mg TID in 9/24 for this. Patient reports that he actually self decreased methimazole from TID dosing to BID dosing in early October as he felt like he had zero energy. Prior evaluation with NM uptake scan in 3/24 showed no nodules, however this was when labs were consistent with subclinical hyperthyroidism. TRAB negative 9/24.  A: Likely graves, I do not appreciate exopthalmos or goiter on exam today. P: TSH, freeT4, T3 If free T4 and T3 wnl would consider decreasing by 30-50% Current dose with methimazole 10 mg BID  Addendum 11/18: Free T4, T3 low with normal TSH Decreased methimazole from BID to every day dosing    Patient discussed with Dr. Hurshel Keys Tayvian Holycross, D.O. [facility]  PGY-3 Pager: [phone]  Phone: [phone] Date 06/28/2023  Time 8:03 AM

## 2023-06-25 NOTE — Patient Instructions (Signed)
Thank you, [name] for allowing [place] to provide your care today.  Thyroid I will call on Monday and if we need to adjust methimazole. I do think it would be good to have you follow-up with endocrinology and the eye doctor. The endocrine office # is listed below.  [facility] [place]ocrinologist [place] Address: [address], [place], [place] [address]   Phone: [phone]  I have ordered the following labs for you:   Lab Orders         TSH         T4, Free         T3       I have ordered the following medication/changed the following medications:   Stop the following medications: There are no discontinued medications.   Start the following medications: No orders of the defined types were placed in this encounter.    Follow up: pending lab work  We look forward to seeing you next time. Please call our clinic at [phone] if you have any questions or concerns. The best time to call is Monday-Friday from 9am-4pm, but there is someone available 24/7. If after hours or the weekend, call the main hospital number and ask for the Internal Medicine Resident On-Call. If you need medication refills, please notify your pharmacy one week in advance and they will send [place] a request.   Thank you for trusting me with your care. Wishing you the best!   Rudene Christians, DO [facility]

## 2023-06-27 LAB — T3: T3, Total: 58 ng/dL — ABNORMAL LOW (ref 71–180)

## 2023-06-27 LAB — TSH: TSH: 2.97 u[IU]/mL (ref 0.450–4.500)

## 2023-06-27 LAB — T4, FREE: Free T4: 0.49 ng/dL — ABNORMAL LOW (ref 0.82–1.77)

## 2023-06-28 MED ORDER — METHIMAZOLE 10 MG PO TABS
10.0000 mg | ORAL_TABLET | Freq: Every day | ORAL | 3 refills | Status: DC
Start: 2023-06-28 — End: 2023-11-29

## 2023-06-28 NOTE — Addendum Note (Signed)
Addended by: Lucille Passy on: 06/28/2023 01:02 PM   Modules accepted: Orders

## 2023-06-28 NOTE — Addendum Note (Signed)
Addended by: Lucille Passy on: 06/28/2023 08:03 AM   Modules accepted: Orders

## 2023-06-28 NOTE — Progress Notes (Signed)
Internal Medicine Clinic Attending  Case discussed with the resident at the time of the visit.  We reviewed the resident's history and exam and pertinent patient test results.  I agree with the assessment, diagnosis, and plan of care documented in the resident's note.   I agree with plan to decrease Methimazole to 10mg  daily dosing. He will need repeat thyroid function test in 4-6 weeks.

## 2023-07-03 ENCOUNTER — Other Ambulatory Visit: Payer: Self-pay | Admitting: Internal Medicine

## 2023-07-03 DIAGNOSIS — E7800 Pure hypercholesterolemia, unspecified: Secondary | ICD-10-CM

## 2023-07-11 ENCOUNTER — Other Ambulatory Visit: Payer: Self-pay | Admitting: Internal Medicine

## 2023-07-11 DIAGNOSIS — I10 Essential (primary) hypertension: Secondary | ICD-10-CM

## 2023-07-11 DIAGNOSIS — E7800 Pure hypercholesterolemia, unspecified: Secondary | ICD-10-CM

## 2023-07-18 ENCOUNTER — Other Ambulatory Visit: Payer: Self-pay | Admitting: Internal Medicine

## 2023-07-18 DIAGNOSIS — E1142 Type 2 diabetes mellitus with diabetic polyneuropathy: Secondary | ICD-10-CM

## 2023-07-18 DIAGNOSIS — G8929 Other chronic pain: Secondary | ICD-10-CM

## 2023-07-18 DIAGNOSIS — E119 Type 2 diabetes mellitus without complications: Secondary | ICD-10-CM

## 2023-07-23 ENCOUNTER — Other Ambulatory Visit: Payer: Self-pay | Admitting: Internal Medicine

## 2023-07-23 DIAGNOSIS — E1142 Type 2 diabetes mellitus with diabetic polyneuropathy: Secondary | ICD-10-CM

## 2023-07-23 DIAGNOSIS — G8929 Other chronic pain: Secondary | ICD-10-CM

## 2023-07-23 DIAGNOSIS — E119 Type 2 diabetes mellitus without complications: Secondary | ICD-10-CM

## 2023-07-23 DIAGNOSIS — R350 Frequency of micturition: Secondary | ICD-10-CM

## 2023-08-13 ENCOUNTER — Other Ambulatory Visit: Payer: Medicare HMO

## 2023-08-18 ENCOUNTER — Other Ambulatory Visit: Payer: Self-pay | Admitting: Internal Medicine

## 2023-08-18 DIAGNOSIS — R350 Frequency of micturition: Secondary | ICD-10-CM

## 2023-08-18 NOTE — Telephone Encounter (Signed)
 Medication sent to pharmacy

## 2023-08-20 ENCOUNTER — Encounter: Payer: Medicare HMO | Admitting: Student

## 2023-08-23 ENCOUNTER — Other Ambulatory Visit: Payer: Self-pay | Admitting: Internal Medicine

## 2023-08-23 DIAGNOSIS — G8929 Other chronic pain: Secondary | ICD-10-CM

## 2023-08-23 DIAGNOSIS — E1142 Type 2 diabetes mellitus with diabetic polyneuropathy: Secondary | ICD-10-CM

## 2023-08-23 DIAGNOSIS — E119 Type 2 diabetes mellitus without complications: Secondary | ICD-10-CM

## 2023-09-02 ENCOUNTER — Ambulatory Visit (INDEPENDENT_AMBULATORY_CARE_PROVIDER_SITE_OTHER): Payer: 59 | Admitting: Internal Medicine

## 2023-09-02 ENCOUNTER — Ambulatory Visit (HOSPITAL_COMMUNITY)
Admission: RE | Admit: 2023-09-02 | Discharge: 2023-09-02 | Disposition: A | Discharge status: Home / Self Care | Payer: 59 | Source: Ambulatory Visit | Attending: Internal Medicine | Admitting: Internal Medicine

## 2023-09-02 VITALS — BP 137/76 | HR 72 | Temp 98.4°F | Wt 203.6 lb

## 2023-09-02 DIAGNOSIS — Z720 Tobacco use: Secondary | ICD-10-CM

## 2023-09-02 DIAGNOSIS — M25561 Pain in right knee: Secondary | ICD-10-CM | POA: Insufficient documentation

## 2023-09-02 DIAGNOSIS — E0590 Thyrotoxicosis, unspecified without thyrotoxic crisis or storm: Secondary | ICD-10-CM | POA: Diagnosis not present

## 2023-09-02 DIAGNOSIS — E119 Type 2 diabetes mellitus without complications: Secondary | ICD-10-CM | POA: Diagnosis not present

## 2023-09-02 DIAGNOSIS — G8929 Other chronic pain: Secondary | ICD-10-CM | POA: Diagnosis not present

## 2023-09-02 DIAGNOSIS — F319 Bipolar disorder, unspecified: Secondary | ICD-10-CM

## 2023-09-02 DIAGNOSIS — F17210 Nicotine dependence, cigarettes, uncomplicated: Secondary | ICD-10-CM

## 2023-09-02 DIAGNOSIS — M1711 Unilateral primary osteoarthritis, right knee: Secondary | ICD-10-CM | POA: Diagnosis not present

## 2023-09-02 MED ORDER — NICOTINE 21 MG/24HR TD PT24: 21.0000 mg | MEDICATED_PATCH | TRANSDERMAL | 0 refills | Status: AC

## 2023-09-02 NOTE — Assessment & Plan Note (Signed)
Current medications include Lamictal and Seroquel. He follows closely with Dr. Magdalen Spatz office. Dr. Evelene Croon requested that his kidney and liver function be tested. P: Continue lamictal and seroquel Check BMP and liver function panel

## 2023-09-02 NOTE — Addendum Note (Signed)
Addended by: Lucille Passy on: 09/02/2023 04:32 PM   Modules accepted: Orders

## 2023-09-02 NOTE — Assessment & Plan Note (Signed)
About a year ago he was helping his daughter with yard work when his knee suddenly gave out.  He did not have any swelling at that time.  Since then he has continued to have episodes of his knee giving out.  On chart review he did have an x-ray of his right knee in 2020 that showed moderate tricompartmental osteoarthritis.  Physical exam there is no swelling or ligamental laxity concerning for ligament tear.  He does have some referred pain that goes from the back of his knee into his hamstring concerning for Baker's cyst.  McMurray's was negative. A: Differentials include osteoarthritis complicated by bone spurring or Baker's cyst versus meniscal tear. P: I reviewed my concerns with patient.  He would like to avoid surgery if at all possible.  He would be interested in trying steroid injection and physical therapy prior to referral to orthopedic surgeon. -X-ray of right knee -Follow-up for procedure visit with steroid injection

## 2023-09-02 NOTE — Progress Notes (Addendum)
Subjective:  CC: hyperthyroid  HPI:  Jesus Martin is a 63 y.o. male with a past medical history hyperthyroidism, bipolar disorder on lithium, COPD who presents today for follow-up on hyperthyroidism.   Please see problem based assessment and plan for additional details.  Past Medical History:  Diagnosis Date   Bipolar I disorder [facility]) 1991   Dr. Evelene Croon   Borderline hyperlipidemia    history of   COPD (chronic obstructive pulmonary disease) (HCC)    Diabetes mellitus, new onset (HCC) 12/22/2017   Fibromyalgia    GERD (gastroesophageal reflux disease)    HTN (hypertension)    Lumbar vertebral fracture (HCC) 01/29/2019   L1   Tobacco abuse    Viral conjunctivitis of both eyes 08/14/2022   Onset 2 days ago. Eyes are itchy and painful. Pt having tearing of both eyes.    Vitamin D deficiency     Current Outpatient Medications on File Prior to Visit  Medication Sig Dispense Refill   Accu-Chek FastClix Lancets MISC Check blood sugar 3 times a day. 102 each 11   amLODipine (NORVASC) 10 MG tablet Take 1 tablet by mouth once daily 90 tablet 0   atorvastatin (LIPITOR) 40 MG tablet TAKE 1 TABLET BY MOUTH ONCE DAILY AT  6PM 90 tablet 0   blood glucose meter kit and supplies KIT Dispense based on patient and insurance preference. Use up to four times daily as directed. (FOR ICD-9 250.00, 250.01). 1 each 0   Blood Glucose Monitoring Suppl (ACCU-CHEK GUIDE) w/Device KIT 1 each by Does not apply route 3 (three) times daily. 1 kit 1   carvedilol (COREG) 12.5 MG tablet TAKE 1 TABLET BY MOUTH TWICE DAILY WITH A MEAL 180 tablet 0   diclofenac sodium (VOLTAREN) 1 % GEL Apply 2 g topically 4 (four) times daily. 350 g 6   Empagliflozin-metFORMIN HCl (SYNJARDY) 12-998 MG TABS Take 1 tablet by mouth twice daily 180 tablet 3   EPINEPHrine 0.3 mg/0.3 mL IJ SOAJ injection Inject 0.3 mg into the muscle once as needed (for anaphylactic reaction to bee stings).     Fluticasone-Umeclidin-Vilant  (TRELEGY ELLIPTA) 100-62.5-25 MCG/ACT AEPB Inhale 1 puff by mouth once daily 60 each 3   glucose blood (ACCU-CHEK GUIDE) test strip USE 1  THREE TIMES DAILY TO CHECK BLOOD SUGAR 100 each 9   hydrocortisone 1 % ointment APPLY  OINTMENT EXTERNALLY TWICE DAILY 29 g 0   ipratropium-albuterol (DUONEB) 0.5-2.5 (3) MG/3ML SOLN Take 3 mLs by nebulization every 4 (four) hours as needed. 360 mL 1   lamoTRIgine (LAMICTAL) 150 MG tablet Take 150 mg by mouth 2 (two) times daily.     losartan-hydrochlorothiazide (HYZAAR) 100-25 MG tablet Take 1 tablet by mouth once daily 90 tablet 0   methimazole (TAPAZOLE) 10 MG tablet Take 1 tablet (10 mg total) by mouth daily. 90 tablet 3   methocarbamol (ROBAXIN-750) 750 MG tablet Take 1 tablet (750 mg total) by mouth every 6 (six) hours as needed for muscle spasms. 60 tablet 0   multivitamin (ONE-A-DAY MEN'S) TABS tablet Take 1 tablet by mouth daily.     omeprazole (PRILOSEC) 40 MG capsule Take 1 capsule by mouth once daily 90 capsule 3   pregabalin (LYRICA) 150 MG capsule Take 1 capsule by mouth twice daily 60 capsule 0   QUEtiapine (SEROQUEL) 50 MG tablet Take 225 mg by mouth at bedtime.     tamsulosin (FLOMAX) 0.4 MG CAPS capsule Take 1 capsule by mouth once daily 30  capsule 3   traMADol (ULTRAM) 50 MG tablet Take 1 tablet by mouth twice daily as needed 60 tablet 0   VENTOLIN HFA 108 (90 Base) MCG/ACT inhaler INHALE 2 PUFFS BY MOUTH EVERY 6 HOURS AS NEEDED FOR WHEEZING OR SHORTNESS OF BREATH 18 g 3   No current facility-administered medications on file prior to visit.    Family History  Adopted: Yes    Social History   Socioeconomic History   Marital status: Married    Spouse name: Carollee Herter   Number of children: 2   Years of education: Not on file   Highest education level: Not on file  Occupational History   Occupation: disabled  Tobacco Use   Smoking status: Every Day    Current packs/day: 0.75    Average packs/day: 0.8 packs/day for 45.0 years (33.8  ttl pk-yrs)    Types: Cigarettes   Smokeless tobacco: Never   Tobacco comments:    3/4 pack smoked daily ARJ 12/09/21  Vaping Use   Vaping status: Never Used  Substance and Sexual Activity   Alcohol use: Yes    Comment: Beer sometimes.   Drug use: No   Sexual activity: Not on file  Other Topics Concern   Not on file  Social History Narrative   Lives with wife Gay Rape (married 28 years) and son. Has 4 dogs and fish. Not employed. Woodworking for fun. HS graduate. Quit smoking in 2008. 26 pack years. No recreational drug use. Drinks 1 glass of wine per month. Does not exercise. Custody of 3 other children, 10, 11, 15.   Social Drivers of Health   Financial Resource Strain: High Risk (03/03/2023)   Overall Financial Resource Strain (CARDIA)    Difficulty of Paying Living Expenses: Hard  Food Insecurity: Food Insecurity Present (03/03/2023)   Hunger Vital Sign    Worried About Running Out of Food in the Last Year: Sometimes true    Ran Out of Food in the Last Year: Sometimes true  Transportation Needs: No Transportation Needs (03/03/2023)   PRAPARE - Administrator, Civil Service (Medical): No    Lack of Transportation (Non-Medical): No  Physical Activity: Sufficiently Active (03/03/2023)   Exercise Vital Sign    Days of Exercise per Week: 7 days    Minutes of Exercise per Session: 60 min  Stress: Stress Concern Present (03/03/2023)   [employer] of Occupational Health - Occupational Stress Questionnaire    Feeling of Stress : Rather much  Social Connections: Unknown (03/03/2023)   Social Connection and Isolation Panel [NHANES]    Frequency of Communication with Friends and Family: Once a week    Frequency of Social Gatherings with Friends and Family: Not on file    Attends Religious Services: 1 to 4 times per year    Active Member of [employer] or Organizations: No    Attends Banker Meetings: Never    Marital Status: Married  Catering manager  Violence: Not At Risk (03/03/2023)   Humiliation, Afraid, Rape, and Kick questionnaire    Fear of Current or Ex-Partner: No    Emotionally Abused: No    Physically Abused: No    Sexually Abused: No    Review of Systems: ROS negative except for what is noted on the assessment and plan.  Objective:   Vitals:   09/02/23 0844  BP: 137/76  Pulse: 72  Temp: 98.4 F (36.9 C)  TempSrc: Oral  Weight: 203 lb 9.6 oz (92.4 kg)  Physical Exam: Constitutional: well-appearing, no exopthalmos or goiter Cardiovascular: regular rate and rhythm, no m/r/g Pulmonary/Chest: normal work of breathing on room air, lungs clear to auscultation bilaterally MSK:  Right knee - Inspection: no erythema, mild swelling to medial side - Palpation: TTP along joint line, pain radiates into hamstrings - ROM: full active and passive extension and flexion - Strength: 5/5 with extension and flexion - Special Tests: anterior and posterior drawer test without laxity compared to left, no laxity with varus and valgus stress testing, McMurray's negative Skin: warm and dry  Assessment & Plan:  Right knee pain About a year ago he was helping his daughter with yard work when his knee suddenly gave out.  He did not have any swelling at that time.  Since then he has continued to have episodes of his knee giving out.  On chart review he did have an x-ray of his right knee in 2020 that showed moderate tricompartmental osteoarthritis.  Physical exam there is no swelling or ligamental laxity concerning for ligament tear.  He does have some referred pain that goes from the back of his knee into his hamstring concerning for Baker's cyst.  McMurray's was negative. A: Differentials include osteoarthritis complicated by bone spurring or Baker's cyst versus meniscal tear. P: I reviewed my concerns with patient.  He would like to avoid surgery if at all possible.  He would be interested in trying steroid injection and physical therapy  prior to referral to orthopedic surgeon. -X-ray of right knee -Follow-up for procedure visit with steroid injection  Hyperthyroidism Prior evaluation with NM uptake scan in 3/24 showed no nodules, however this was when labs were consistent with subclinical hyperthyroidism. TRAB negative 9/24.  He was started on methimazole 9/24 and dose decreased 11/24. He is back today to recheck thyroid levels. Current meds: methimazole 10 mg every day. His wife helps him with his medications P: TSH Free T4  Addendum: TSH and free T4 within normal limits  Diabetes (HCC) Diabetes has been well controlled for last 1-2 years. Will check A1c today and then move to every 6 months. Last was 9/24 with A1c of 5.9. P: A1c They just switched insurances and his wife is working on scheduling an ophthalmology referral a1c  Tobacco use He is interested in smoking cessation today.  He has previously tried nicotine replacement therapy without much success.  He does have a comorbid condition with bipolar 1 disorder managed by Dr. Evelene Croon.  P: I called Dr. Carie Caddy office to ask if Chantix would be a safe medication to try for him.  He has tried bupropion in the past and this made his depression symptoms worth with bipolar.  Will plan to try nicotine replacement therapy if Dr. Evelene Croon is not comfortable with Chantix trial  Addendum: Dr. Evelene Croon commended not starting Chantix and trying nicotine replacement therapy.  I talked with the patient's wife.  I will send in a 30-day supply of nicotine patch 21 mg. Nicotine gum also sent.  Bipolar 1 disorder (HCC) Current medications include Lamictal and Seroquel. He follows closely with Dr. Magdalen Spatz office. Dr. Evelene Croon requested that his kidney and liver function be tested. P: Continue lamictal and seroquel Check BMP and liver function panel    Patient discussed with Dr. Precious Bard Shelitha Magley, D.O. [facility]  PGY-3 Pager: [phone]  Phone:  [phone] Date 09/03/2023  Time 2:08 PM

## 2023-09-02 NOTE — Assessment & Plan Note (Addendum)
Prior evaluation with NM uptake scan in 3/24 showed no nodules, however this was when labs were consistent with subclinical hyperthyroidism. TRAB negative 9/24.  He was started on methimazole 9/24 and dose decreased 11/24. He is back today to recheck thyroid levels. Current meds: methimazole 10 mg every day. His wife helps him with his medications P: TSH Free T4  Addendum: TSH and free T4 within normal limits

## 2023-09-02 NOTE — Patient Instructions (Addendum)
Thank you, Jesus Martin for allowing [place] to provide your care today.  Thyroid I am rechecking your thyroid labs.  I will call you with results and if we need to change your plan for methimazole.  Diabetes I am checking your hemoglobin A1c.  I think we can move this out to checking every 6 months because it has been under control for the last year.  Right knee pain Concerned that this is worsening of your osteoarthritis.  You had an x-ray in 2020 that showed moderate osteoarthritis at that time.  We will get the x-ray today and then come back for procedure only appointment for right knee injection.  Smoking cessation I would like to start chantix to help with smoking cessation. I will get in contact with Dr. Evelene Croon prior to starting this as she can help guide treatment and ensure close follow-up when you see him next month.  I have ordered the following labs for you:  Lab Orders         TSH         T4, Free         Hemoglobin A1c      I have ordered the following medication/changed the following medications:   Stop the following medications: There are no discontinued medications.   Start the following medications: No orders of the defined types were placed in this encounter.    Follow up: 6 months   We look forward to seeing you next time. Please call our clinic at [phone] if you have any questions or concerns. The best time to call is Monday-Friday from 9am-4pm, but there is someone available 24/7. If after hours or the weekend, call the main hospital number and ask for the Internal Medicine Resident On-Call. If you need medication refills, please notify your pharmacy one week in advance and they will send [place] a request.   Thank you for trusting me with your care. Wishing you the best!   Rudene Christians, DO [facility]

## 2023-09-02 NOTE — Assessment & Plan Note (Addendum)
He is interested in smoking cessation today.  He has previously tried nicotine replacement therapy without much success.  He does have a comorbid condition with bipolar 1 disorder managed by Dr. Evelene Croon.  P: I called Dr. Carie Caddy office to ask if Chantix would be a safe medication to try for him.  He has tried bupropion in the past and this made his depression symptoms worth with bipolar.  Will plan to try nicotine replacement therapy if Dr. Evelene Croon is not comfortable with Chantix trial  Addendum: Dr. Evelene Croon commended not starting Chantix and trying nicotine replacement therapy.  I talked with the patient's wife.  I will send in a 30-day supply of nicotine patch 21 mg. Nicotine gum also sent.

## 2023-09-02 NOTE — Assessment & Plan Note (Signed)
Diabetes has been well controlled for last 1-2 years. Will check A1c today and then move to every 6 months. Last was 9/24 with A1c of 5.9. P: A1c They just switched insurances and his wife is working on scheduling an ophthalmology referral a1c

## 2023-09-03 LAB — HEPATIC FUNCTION PANEL
ALT: 25 [IU]/L (ref 0–44)
AST: 19 [IU]/L (ref 0–40)
Albumin: 4.7 g/dL (ref 3.9–4.9)
Alkaline Phosphatase: 98 [IU]/L (ref 44–121)
Bilirubin Total: 0.3 mg/dL (ref 0.0–1.2)
Bilirubin, Direct: 0.14 mg/dL (ref 0.00–0.40)
Total Protein: 6.9 g/dL (ref 6.0–8.5)

## 2023-09-03 LAB — BMP8+ANION GAP
Anion Gap: 17 mmol/L (ref 10.0–18.0)
BUN/Creatinine Ratio: 20 (ref 10–24)
BUN: 22 mg/dL (ref 8–27)
CO2: 20 mmol/L (ref 20–29)
Calcium: 9.7 mg/dL (ref 8.6–10.2)
Chloride: 107 mmol/L — ABNORMAL HIGH (ref 96–106)
Creatinine, Ser: 1.12 mg/dL (ref 0.76–1.27)
Glucose: 106 mg/dL — ABNORMAL HIGH (ref 70–99)
Potassium: 4.3 mmol/L (ref 3.5–5.2)
Sodium: 144 mmol/L (ref 134–144)
eGFR: 74 mL/min/{1.73_m2} (ref >59)

## 2023-09-03 LAB — T4, FREE: Free T4: 0.97 ng/dL (ref 0.82–1.77)

## 2023-09-03 LAB — HEMOGLOBIN A1C
Est. average glucose Bld gHb Est-mCnc: 126 mg/dL
Hgb A1c MFr Bld: 6 % — ABNORMAL HIGH (ref 4.8–5.6)

## 2023-09-03 LAB — TSH: TSH: 0.873 u[IU]/mL (ref 0.450–4.500)

## 2023-09-03 MED ORDER — NICOTINE POLACRILEX 4 MG MT GUM: 4.0000 mg | CHEWING_GUM | OROMUCOSAL | 0 refills | Status: DC | PRN

## 2023-09-03 NOTE — Addendum Note (Signed)
Addended by: Dickie La on: 09/03/2023 10:56 AM   Modules accepted: Level of Service

## 2023-09-03 NOTE — Progress Notes (Signed)
Internal Medicine Clinic Attending  Case discussed with the resident at the time of the visit.  We reviewed the resident's history and exam and pertinent patient test results.  I agree with the assessment, diagnosis, and plan of care documented in the resident's note.

## 2023-09-03 NOTE — Addendum Note (Signed)
Addended by: Lucille Passy on: 09/03/2023 01:55 PM   Modules accepted: Orders

## 2023-09-03 NOTE — Addendum Note (Signed)
Addended by: Lucille Passy on: 09/03/2023 11:29 AM   Modules accepted: Level of Service

## 2023-09-09 ENCOUNTER — Encounter: Payer: Self-pay | Admitting: Internal Medicine

## 2023-09-21 ENCOUNTER — Other Ambulatory Visit: Payer: Self-pay | Admitting: Internal Medicine

## 2023-09-21 DIAGNOSIS — E119 Type 2 diabetes mellitus without complications: Secondary | ICD-10-CM

## 2023-09-21 DIAGNOSIS — G8929 Other chronic pain: Secondary | ICD-10-CM

## 2023-09-21 DIAGNOSIS — E1142 Type 2 diabetes mellitus with diabetic polyneuropathy: Secondary | ICD-10-CM

## 2023-10-01 ENCOUNTER — Other Ambulatory Visit: Payer: Self-pay

## 2023-10-01 ENCOUNTER — Ambulatory Visit: Payer: 59 | Admitting: Student

## 2023-10-01 ENCOUNTER — Encounter: Payer: Self-pay | Admitting: Student

## 2023-10-01 VITALS — BP 121/67 | HR 102 | Temp 98.4°F | Ht 72.0 in | Wt 204.0 lb

## 2023-10-01 DIAGNOSIS — Z794 Long term (current) use of insulin: Secondary | ICD-10-CM

## 2023-10-01 DIAGNOSIS — M1711 Unilateral primary osteoarthritis, right knee: Secondary | ICD-10-CM | POA: Diagnosis not present

## 2023-10-01 DIAGNOSIS — J441 Chronic obstructive pulmonary disease with (acute) exacerbation: Secondary | ICD-10-CM | POA: Diagnosis not present

## 2023-10-01 DIAGNOSIS — G8929 Other chronic pain: Secondary | ICD-10-CM

## 2023-10-01 DIAGNOSIS — E119 Type 2 diabetes mellitus without complications: Secondary | ICD-10-CM | POA: Diagnosis not present

## 2023-10-01 DIAGNOSIS — F17220 Nicotine dependence, chewing tobacco, uncomplicated: Secondary | ICD-10-CM

## 2023-10-01 DIAGNOSIS — Z720 Tobacco use: Secondary | ICD-10-CM

## 2023-10-01 MED ORDER — TRIAMCINOLONE ACETONIDE 40 MG/ML IJ SUSP
40.0000 mg | Freq: Once | INTRAMUSCULAR | Status: AC
  Administered 2023-10-01: 40 mg via INTRA_ARTICULAR

## 2023-10-01 MED ORDER — AZITHROMYCIN 500 MG PO TABS: 500.0000 mg | ORAL_TABLET | Freq: Every day | ORAL | 0 refills | Status: AC

## 2023-10-01 MED ORDER — LIDOCAINE HCL (PF) 1 % IJ SOLN
2.0000 mL | Freq: Once | INTRAMUSCULAR | Status: AC
  Administered 2023-10-01: 2 mL

## 2023-10-01 MED ORDER — PREDNISONE 20 MG PO TABS: 20.0000 mg | ORAL_TABLET | Freq: Every day | ORAL | 0 refills | Status: DC

## 2023-10-01 NOTE — Patient Instructions (Signed)
 I have sent in the steroids and antibiotic to your pharmacy.  Please call the office and/or go to ED if you develop worsening respiratory symptoms or if you develop acutely worsening knee pain/redness/swelling after the injection.   Follow-up in 6 months

## 2023-10-01 NOTE — Progress Notes (Signed)
 CC: Follow-up and knee injection  HPI:  [name] is a 63 y.o. male living with a history stated below and presents today for follow-up and knee injection. Please see problem based assessment and plan for additional details.  Past Medical History:  Diagnosis Date   Bipolar I disorder [facility]) 1991   Dr. Evelene Martin   Borderline hyperlipidemia    history of   COPD (chronic obstructive pulmonary disease) (HCC)    Diabetes mellitus, new onset (HCC) 12/22/2017   Fibromyalgia    GERD (gastroesophageal reflux disease)    HTN (hypertension)    Lumbar vertebral fracture (HCC) 01/29/2019   L1   Tobacco abuse    Viral conjunctivitis of both eyes 08/14/2022   Onset 2 days ago. Eyes are itchy and painful. Pt having tearing of both eyes.    Vitamin D deficiency     Current Outpatient Medications on File Prior to Visit  Medication Sig Dispense Refill   Accu-Chek FastClix Lancets MISC Check blood sugar 3 times a day. 102 each 11   amLODipine (NORVASC) 10 MG tablet Take 1 tablet by mouth once daily 90 tablet 0   atorvastatin (LIPITOR) 40 MG tablet TAKE 1 TABLET BY MOUTH ONCE DAILY AT  6PM 90 tablet 0   blood glucose meter kit and supplies KIT Dispense based on patient and insurance preference. Use up to four times daily as directed. (FOR ICD-9 250.00, 250.01). 1 each 0   Blood Glucose Monitoring Suppl (ACCU-CHEK GUIDE) w/Device KIT 1 each by Does not apply route 3 (three) times daily. 1 kit 1   carvedilol (COREG) 12.5 MG tablet TAKE 1 TABLET BY MOUTH TWICE DAILY WITH A MEAL 180 tablet 0   diclofenac sodium (VOLTAREN) 1 % GEL Apply 2 g topically 4 (four) times daily. 350 g 6   Empagliflozin-metFORMIN HCl (SYNJARDY) 12-998 MG TABS Take 1 tablet by mouth twice daily 180 tablet 3   EPINEPHrine 0.3 mg/0.3 mL IJ SOAJ injection Inject 0.3 mg into the muscle once as needed (for anaphylactic reaction to bee stings).     Fluticasone-Umeclidin-Vilant (TRELEGY ELLIPTA) 100-62.5-25 MCG/ACT AEPB Inhale 1  puff by mouth once daily 60 each 3   glucose blood (ACCU-CHEK GUIDE) test strip USE 1  THREE TIMES DAILY TO CHECK BLOOD SUGAR 100 each 9   hydrocortisone 1 % ointment APPLY  OINTMENT EXTERNALLY TWICE DAILY 29 g 0   ipratropium-albuterol (DUONEB) 0.5-2.5 (3) MG/3ML SOLN Take 3 mLs by nebulization every 4 (four) hours as needed. 360 mL 1   lamoTRIgine (LAMICTAL) 150 MG tablet Take 150 mg by mouth 2 (two) times daily.     losartan-hydrochlorothiazide (HYZAAR) 100-25 MG tablet Take 1 tablet by mouth once daily 90 tablet 0   methimazole (TAPAZOLE) 10 MG tablet Take 1 tablet (10 mg total) by mouth daily. 90 tablet 3   methocarbamol (ROBAXIN-750) 750 MG tablet Take 1 tablet (750 mg total) by mouth every 6 (six) hours as needed for muscle spasms. 60 tablet 0   multivitamin (ONE-A-DAY MEN'S) TABS tablet Take 1 tablet by mouth daily.     nicotine polacrilex (NICORETTE) 4 MG gum Take 1 each (4 mg total) by mouth as needed for smoking cessation. 100 tablet 0   omeprazole (PRILOSEC) 40 MG capsule Take 1 capsule by mouth once daily 90 capsule 3   pregabalin (LYRICA) 150 MG capsule Take 1 capsule by mouth twice daily 60 capsule 0   QUEtiapine (SEROQUEL) 50 MG tablet Take 225 mg by mouth at bedtime.  tamsulosin (FLOMAX) 0.4 MG CAPS capsule Take 1 capsule by mouth once daily 30 capsule 3   traMADol (ULTRAM) 50 MG tablet Take 1 tablet by mouth twice daily as needed 60 tablet 0   VENTOLIN HFA 108 (90 Base) MCG/ACT inhaler INHALE 2 PUFFS BY MOUTH EVERY 6 HOURS AS NEEDED FOR WHEEZING OR SHORTNESS OF BREATH 18 g 3   No current facility-administered medications on file prior to visit.    Family History  Adopted: Yes    Social History   Socioeconomic History   Marital status: Married    Spouse name: Jesus Martin   Number of children: 2   Years of education: Not on file   Highest education level: Not on file  Occupational History   Occupation: disabled  Tobacco Use   Smoking status: Every Day    Current  packs/day: 0.75    Average packs/day: 0.8 packs/day for 45.0 years (33.8 ttl pk-yrs)    Types: Cigarettes   Smokeless tobacco: Never   Tobacco comments:    3/4 pack smoked daily ARJ 12/09/21  Vaping Use   Vaping status: Never Used  Substance and Sexual Activity   Alcohol use: Yes    Comment: Beer sometimes.   Drug use: No   Sexual activity: Not on file  Other Topics Concern   Not on file  Social History Narrative   Lives with wife Jesus Martin (married 28 years) and son. Has 4 dogs and fish. Not employed. Woodworking for fun. HS graduate. Quit smoking in 2008. 26 pack years. No recreational drug use. Drinks 1 glass of wine per month. Does not exercise. Custody of 3 other children, 10, 11, 15.   Social Drivers of Health   Financial Resource Strain: High Risk (03/03/2023)   Overall Financial Resource Strain (CARDIA)    Difficulty of Paying Living Expenses: Hard  Food Insecurity: Food Insecurity Present (03/03/2023)   Hunger Vital Sign    Worried About Running Out of Food in the Last Year: Sometimes true    Ran Out of Food in the Last Year: Sometimes true  Transportation Needs: No Transportation Needs (03/03/2023)   PRAPARE - Administrator, Civil Service (Medical): No    Lack of Transportation (Non-Medical): No  Physical Activity: Sufficiently Active (03/03/2023)   Exercise Vital Sign    Days of Exercise per Week: 7 days    Minutes of Exercise per Session: 60 min  Stress: Stress Concern Present (03/03/2023)   [employer] of Occupational Health - Occupational Stress Questionnaire    Feeling of Stress : Rather much  Social Connections: Unknown (03/03/2023)   Social Connection and Isolation Panel [NHANES]    Frequency of Communication with Friends and Family: Once a week    Frequency of Social Gatherings with Friends and Family: Not on file    Attends Religious Services: 1 to 4 times per year    Active Member of [employer] or Organizations: No    Attends Tax inspector Meetings: Never    Marital Status: Married  Catering manager Violence: Not At Risk (03/03/2023)   Humiliation, Afraid, Rape, and Kick questionnaire    Fear of Current or Ex-Partner: No    Emotionally Abused: No    Physically Abused: No    Sexually Abused: No    Review of Systems: ROS negative except for what is noted on the assessment and plan.  Vitals:   10/01/23 1116  BP: 121/67  Pulse: (!) 102  Temp: 98.4 F (36.9 C)  TempSrc: Oral  SpO2: 97%  Weight: 204 lb (92.5 kg)  Height: 6' (1.829 m)    Physical Exam: Constitutional: well-appearing, sitting in chair, in no acute distress Cardiovascular: regular rate and rhythm, no m/r/g Pulmonary/Chest: normal work of breathing on room air, rhonchi bilaterally Abdominal: soft, non-tender, non-distended MSK: normal bulk and tone Skin: warm and dry Psych: normal mood and behavior  Assessment & Plan:     Patient seen with Dr. Cleda Daub  COPD exacerbation (HCC) Endorses worsening SOB, cough, and sputum production for the past two weeks. No other infectious symptoms. -Prednisone 20 mg for 5 days -Zithromax 500 mg for 3 days  Diabetes (HCC) A1c from last month was 6.0. Managed with Synjardy 12-998 BID -Ophthalmology referral -Microalbumin to Creatinine Ratio  Tobacco use Quit cigarettes a few weeks ago. Still uses dip daily. -Low dose lung CT  Osteoarthritis of right knee Presents today for steroid injection (see procedure note). X-ray 08/2023 noted tricompartmental degenerative changes, mild-to-moderate in the medial compartment.  PROCEDURE NOTE  PROCEDURE: right knee joint steroid injection.  PREOPERATIVE DIAGNOSIS: Osteoarthritis of the right knee.  POSTOPERATIVE DIAGNOSIS: Osteoarthritis of the right knee.  PROCEDURE: The patient was apprised of the risks and the benefits of the procedure and informed consent was obtained, as witnessed by Dr. Mikey Bussing. Time-out procedure was performed, with  confirmation of the patient's name, date of birth, and correct identification of the right knee to be injected. The patient's knee was then marked at the appropriate site for injection placement. The knee was sterilely prepped with Betadine. A 40 mg (1 milliliter) solution of Kenalog was drawn up into a 5 mL syringe with a 2 mL of 1% lidocaine. The patient was injected with a 25-gauge needle at the medial aspect of his right flexed knee. There were no complications. The patient tolerated the procedure well. There was minimal bleeding. The patient was instructed to ice his knee upon leaving clinic and refrain from overuse over the next 3 days. The patient was instructed to go to the emergency room with any usual pain, swelling, or redness occurred in the injected area. The patient was given a followup appointment to evaluate response to the injection to his increased range of motion and reduction of pain.  The procedure was supervised by attending physician, Dr. Mikey Bussing.    Carmina Miller, D.O. [facility] Internal Medicine, PGY-1 Phone: [phone] Date 10/04/2023 Time 11:01 AM

## 2023-10-03 LAB — MICROALBUMIN / CREATININE URINE RATIO
Creatinine, Urine: 44.9 mg/dL
Microalb/Creat Ratio: 23 mg/g{creat} (ref 0–29)
Microalbumin, Urine: 10.3 ug/mL

## 2023-10-04 ENCOUNTER — Encounter: Payer: Self-pay | Admitting: Student

## 2023-10-04 DIAGNOSIS — J441 Chronic obstructive pulmonary disease with (acute) exacerbation: Secondary | ICD-10-CM | POA: Insufficient documentation

## 2023-10-04 DIAGNOSIS — M1711 Unilateral primary osteoarthritis, right knee: Secondary | ICD-10-CM | POA: Insufficient documentation

## 2023-10-04 NOTE — Assessment & Plan Note (Signed)
 Endorses worsening SOB, cough, and sputum production for the past two weeks. No other infectious symptoms. -Prednisone 20 mg for 5 days -Zithromax 500 mg for 3 days

## 2023-10-04 NOTE — Assessment & Plan Note (Addendum)
 A1c from last month was 6.0. Managed with Synjardy 12-998 BID -Ophthalmology referral -Microalbumin to Creatinine Ratio

## 2023-10-04 NOTE — Assessment & Plan Note (Signed)
 Presents today for steroid injection (see procedure note). X-ray 08/2023 noted tricompartmental degenerative changes, mild-to-moderate in the medial compartment.

## 2023-10-04 NOTE — Assessment & Plan Note (Signed)
 Quit cigarettes a few weeks ago. Still uses dip daily. -Low dose lung CT

## 2023-10-05 ENCOUNTER — Other Ambulatory Visit: Payer: Self-pay | Admitting: Internal Medicine

## 2023-10-05 DIAGNOSIS — I10 Essential (primary) hypertension: Secondary | ICD-10-CM

## 2023-10-09 ENCOUNTER — Other Ambulatory Visit: Payer: Self-pay | Admitting: Internal Medicine

## 2023-10-09 DIAGNOSIS — E7800 Pure hypercholesterolemia, unspecified: Secondary | ICD-10-CM

## 2023-10-09 DIAGNOSIS — I10 Essential (primary) hypertension: Secondary | ICD-10-CM

## 2023-10-10 NOTE — Progress Notes (Signed)
 Internal Medicine Clinic Attending  Case discussed with the resident at the time of the visit.  We reviewed the resident's history and exam and pertinent patient test results.  I agree with the assessment, diagnosis, and plan of care documented in the resident's note.

## 2023-10-11 NOTE — Telephone Encounter (Signed)
 Medication sent to pharmacy

## 2023-10-18 ENCOUNTER — Ambulatory Visit: Payer: Self-pay | Admitting: Internal Medicine

## 2023-10-18 ENCOUNTER — Other Ambulatory Visit: Payer: Self-pay | Admitting: Internal Medicine

## 2023-10-18 DIAGNOSIS — J449 Chronic obstructive pulmonary disease, unspecified: Secondary | ICD-10-CM

## 2023-10-18 DIAGNOSIS — G8929 Other chronic pain: Secondary | ICD-10-CM

## 2023-10-18 NOTE — Telephone Encounter (Signed)
 RTC to patient states wants a MRI for his knee pain as the knee injection did not help.  States does not need to talk with the insurance company.  Told him the process that the doctor needs to order.  Then the referral person makes sure it is covered by your insurance and then an appointment is made.  Patient to await call with time of MRI.

## 2023-10-18 NOTE — Telephone Encounter (Addendum)
 Patient with history of chronic right knee pain.  He had an x-ray that showed mild to moderate tricompartmental OA.  He had a steroid injection in February and did not have any relief in his pain.  Will proceed with MRI of right knee.  Will to make sure were not missing a meniscal tear with history of his knee feeling like it gives out.  I am less concerned for ligamentous injury as he did not have swelling after the event described from office visit January 2025.  P: Order placed for right knee MRI  Addendum 4/8: I called and reviewed results with patient. Referral placed to orthopedic surgery

## 2023-10-18 NOTE — Telephone Encounter (Signed)
  Chief Complaint: right knee pain Symptoms: swelling, painful right knee worse with movement Frequency: ongoing x 1 year, worsening x 6 months Pertinent Negatives: Patient denies chest pain, difficulty breathing, fever, redness in right leg. Disposition: [] ED /[] Urgent Care (no appt availability in office) / [x] Appointment(In office/virtual)/ []  [facility]/ [] Home Care/ [x] Refused Recommended Disposition /[]  Mobile Bus/ []  Follow-up with PCP Additional Notes: Patient calling in asking if he can get a MRI ordered of right knee instead of having to come in for another visit. Refused acute office visit until he has the MRI done first. Patient seen on 10/01/23 and had steroid injection to right knee and states it has not helped at all. Patient asked for a call back at this number [phone].  Copied from CRM [id number]. Topic: Clinical - Red Word Triage >> Oct 18, 2023 12:52 PM Everette Rank wrote: Red Word that prompted transfer to Nurse Triage: Patient RT knee pain for 6 months /3 weeks ago 09/02/23 (cotisone)but pain is still same. Level is 8-9 in pain Reason for Disposition  [1] MODERATE pain (e.g., interferes with normal activities, limping) AND [2] present > 3 days  Answer Assessment - Initial Assessment Questions 1. LOCATION and RADIATION: "Where is the pain located?"      Right knee.  2. QUALITY: "What does the pain feel like?"  (e.g., sharp, dull, aching, burning)     Back of the leg is like cramping and front "just hurts".  3. SEVERITY: "How bad is the pain?" "What does it keep you from doing?"   (Scale 1-10; or mild, moderate, severe)   -  MILD (1-3): doesn't interfere with normal activities    -  MODERATE (4-7): interferes with normal activities (e.g., work or school) or awakens from sleep, limping    -  SEVERE (8-10): excruciating pain, unable to do any normal activities, unable to walk     7/10; states he has taken Tylenol and Ibuprofen today.  4. ONSET: "When  did the pain start?" "Does it come and go, or is it there all the time?"     Over a year, worsening since October.  5. RECURRENT: "Have you had this pain before?" If Yes, ask: "When, and what happened then?"     Patient states he has had this pain ongoing for a year.  6. SETTING: "Has there been any recent work, exercise or other activity that involved that part of the body?"      Denies.  7. AGGRAVATING FACTORS: "What makes the knee pain worse?" (e.g., walking, climbing stairs, running)     "Anything". Walking up the steps, walking through the yard. He states just standing on it.  8. ASSOCIATED SYMPTOMS: "Is there any swelling or redness of the knee?"     Intermittent swelling, states he is able to bend it.  9. OTHER SYMPTOMS: "Do you have any other symptoms?" (e.g., chest pain, difficulty breathing, fever, calf pain)     Pain radiates down down from groin to calf on right side.  Protocols used: Knee Pain-A-AH

## 2023-10-18 NOTE — Addendum Note (Signed)
 Addended by: Lucille Passy on: 10/18/2023 04:23 PM   Modules accepted: Orders

## 2023-10-19 ENCOUNTER — Other Ambulatory Visit: Payer: Self-pay | Admitting: Internal Medicine

## 2023-10-19 DIAGNOSIS — E119 Type 2 diabetes mellitus without complications: Secondary | ICD-10-CM

## 2023-10-19 DIAGNOSIS — E1142 Type 2 diabetes mellitus with diabetic polyneuropathy: Secondary | ICD-10-CM

## 2023-10-19 DIAGNOSIS — G8929 Other chronic pain: Secondary | ICD-10-CM

## 2023-10-21 ENCOUNTER — Encounter: Admitting: Student

## 2023-10-25 ENCOUNTER — Ambulatory Visit: Admitting: Student

## 2023-10-25 VITALS — BP 128/78 | HR 75 | Temp 97.9°F | Ht 72.0 in | Wt 212.1 lb

## 2023-10-25 DIAGNOSIS — M1711 Unilateral primary osteoarthritis, right knee: Secondary | ICD-10-CM

## 2023-10-25 NOTE — Progress Notes (Signed)
 CC: Right knee pain   HPI: Jesus Martin is a 63 y.o. male living with a history stated below and presents today for right knee pain. Please see problem based assessment and plan for additional details.  Past Medical History:  Diagnosis Date   Bipolar I disorder [facility]) 1991   Dr. Evelene Croon   Borderline hyperlipidemia    history of   COPD (chronic obstructive pulmonary disease) (HCC)    Diabetes mellitus, new onset (HCC) 12/22/2017   Fibromyalgia    GERD (gastroesophageal reflux disease)    HTN (hypertension)    Lumbar vertebral fracture (HCC) 01/29/2019   L1   Tobacco abuse    Viral conjunctivitis of both eyes 08/14/2022   Onset 2 days ago. Eyes are itchy and painful. Pt having tearing of both eyes.    Vitamin D deficiency     Current Outpatient Medications on File Prior to Visit  Medication Sig Dispense Refill   Accu-Chek FastClix Lancets MISC Check blood sugar 3 times a day. 102 each 11   amLODipine (NORVASC) 10 MG tablet Take 1 tablet by mouth once daily 90 tablet 0   atorvastatin (LIPITOR) 40 MG tablet TAKE 1 TABLET BY MOUTH ONCE DAILY AT  6  PM. 90 tablet 0   blood glucose meter kit and supplies KIT Dispense based on patient and insurance preference. Use up to four times daily as directed. (FOR ICD-9 250.00, 250.01). 1 each 0   Blood Glucose Monitoring Suppl (ACCU-CHEK GUIDE) w/Device KIT 1 each by Does not apply route 3 (three) times daily. 1 kit 1   carvedilol (COREG) 12.5 MG tablet TAKE 1 TABLET BY MOUTH TWICE DAILY WITH A MEAL 180 tablet 0   diclofenac sodium (VOLTAREN) 1 % GEL Apply 2 g topically 4 (four) times daily. 350 g 6   Empagliflozin-metFORMIN HCl (SYNJARDY) 12-998 MG TABS Take 1 tablet by mouth twice daily 180 tablet 3   EPINEPHrine 0.3 mg/0.3 mL IJ SOAJ injection Inject 0.3 mg into the muscle once as needed (for anaphylactic reaction to bee stings).     glucose blood (ACCU-CHEK GUIDE) test strip USE 1  THREE TIMES DAILY TO CHECK BLOOD SUGAR 100 each 9    hydrocortisone 1 % ointment APPLY  OINTMENT EXTERNALLY TWICE DAILY 29 g 0   ipratropium-albuterol (DUONEB) 0.5-2.5 (3) MG/3ML SOLN Take 3 mLs by nebulization every 4 (four) hours as needed. 360 mL 1   lamoTRIgine (LAMICTAL) 150 MG tablet Take 150 mg by mouth 2 (two) times daily.     losartan-hydrochlorothiazide (HYZAAR) 100-25 MG tablet Take 1 tablet by mouth once daily 90 tablet 0   methimazole (TAPAZOLE) 10 MG tablet Take 1 tablet (10 mg total) by mouth daily. 90 tablet 3   methocarbamol (ROBAXIN-750) 750 MG tablet Take 1 tablet (750 mg total) by mouth every 6 (six) hours as needed for muscle spasms. 60 tablet 0   multivitamin (ONE-A-DAY MEN'S) TABS tablet Take 1 tablet by mouth daily.     nicotine polacrilex (NICORETTE) 4 MG gum Take 1 each (4 mg total) by mouth as needed for smoking cessation. 100 tablet 0   omeprazole (PRILOSEC) 40 MG capsule Take 1 capsule by mouth once daily 90 capsule 3   predniSONE (DELTASONE) 20 MG tablet Take 1 tablet (20 mg total) by mouth daily with breakfast. 5 tablet 0   pregabalin (LYRICA) 150 MG capsule Take 1 capsule by mouth twice daily 60 capsule 3   QUEtiapine (SEROQUEL) 50 MG tablet Take 225 mg by  mouth at bedtime.     tamsulosin (FLOMAX) 0.4 MG CAPS capsule Take 1 capsule by mouth once daily 30 capsule 3   traMADol (ULTRAM) 50 MG tablet Take 1 tablet by mouth twice daily as needed 60 tablet 0   TRELEGY ELLIPTA 100-62.5-25 MCG/ACT AEPB INHALE 1 PUFF ONCE DAILY 60 each 0   VENTOLIN HFA 108 (90 Base) MCG/ACT inhaler INHALE 2 PUFFS BY MOUTH EVERY 6 HOURS AS NEEDED FOR WHEEZING OR SHORTNESS OF BREATH 18 g 3   No current facility-administered medications on file prior to visit.    Family History  Adopted: Yes    Social History   Socioeconomic History   Marital status: Married    Spouse name: Carollee Herter   Number of children: 2   Years of education: Not on file   Highest education level: Not on file  Occupational History   Occupation: disabled  Tobacco  Use   Smoking status: Every Day    Current packs/day: 0.75    Average packs/day: 0.8 packs/day for 45.0 years (33.8 ttl pk-yrs)    Types: Cigarettes   Smokeless tobacco: Never   Tobacco comments:    3/4 pack smoked daily ARJ 12/09/21  Vaping Use   Vaping status: Never Used  Substance and Sexual Activity   Alcohol use: Yes    Comment: Beer sometimes.   Drug use: No   Sexual activity: Not on file  Other Topics Concern   Not on file  Social History Narrative   Lives with wife Alferd Obryant (married 28 years) and son. Has 4 dogs and fish. Not employed. Woodworking for fun. HS graduate. Quit smoking in 2008. 26 pack years. No recreational drug use. Drinks 1 glass of wine per month. Does not exercise. Custody of 3 other children, 10, 11, 15.   Social Drivers of Health   Financial Resource Strain: High Risk (03/03/2023)   Overall Financial Resource Strain (CARDIA)    Difficulty of Paying Living Expenses: Hard  Food Insecurity: Food Insecurity Present (03/03/2023)   Hunger Vital Sign    Worried About Running Out of Food in the Last Year: Sometimes true    Ran Out of Food in the Last Year: Sometimes true  Transportation Needs: No Transportation Needs (03/03/2023)   PRAPARE - Administrator, Civil Service (Medical): No    Lack of Transportation (Non-Medical): No  Physical Activity: Sufficiently Active (03/03/2023)   Exercise Vital Sign    Days of Exercise per Week: 7 days    Minutes of Exercise per Session: 60 min  Stress: Stress Concern Present (03/03/2023)   [employer] of Occupational Health - Occupational Stress Questionnaire    Feeling of Stress : Rather much  Social Connections: Unknown (03/03/2023)   Social Connection and Isolation Panel [NHANES]    Frequency of Communication with Friends and Family: Once a week    Frequency of Social Gatherings with Friends and Family: Not on file    Attends Religious Services: 1 to 4 times per year    Active Member of [employer] or  Organizations: No    Attends Banker Meetings: Never    Marital Status: Married  Catering manager Violence: Not At Risk (03/03/2023)   Humiliation, Afraid, Rape, and Kick questionnaire    Fear of Current or Ex-Partner: No    Emotionally Abused: No    Physically Abused: No    Sexually Abused: No    Review of Systems: ROS negative except for what is noted on the assessment  and plan.  Vitals:   10/25/23 0950  BP: 128/78  Pulse: 75  Temp: 97.9 F (36.6 C)  TempSrc: Oral  SpO2: 98%  Weight: 212 lb 1.6 oz (96.2 kg)  Height: 6' (1.829 m)    Physical Exam: Constitutional: well-appearing in no acute distress HENT: normocephalic atraumatic, mucous membranes moist Eyes: conjunctiva non-erythematous Neck: supple Cardiovascular: regular rate and rhythm, no m/r/g Pulmonary/Chest: normal work of breathing on room air, lungs clear to auscultation bilaterally Abdominal: soft, non-tender, non-distended MSK: normal bulk and tone; reproducible right knee pain with internal rotation during McMurray test; right knee swelling compared to left knee Neurological: alert & oriented x 3, 5/5 strength in bilateral upper and lower extremities, normal gait Skin: warm and dry  Assessment & Plan:   Osteoarthritis of right knee Presenting for follow-up after being seen on 10/01/23. Right knee xray on 09/09/23 demonstrated mild to moderate tricompartmental OA. Pain remains despite steroid injection in February. Today, pain was reproduced with McMurray test. Swelling of medial and lateral portion of knee was also present. Concern remains for possible meniscal injury.  Order placed for MRI of right knee but has been placed but not scheduled. Will follow-up regarding the status of MRI of the knee. Recommended PT for improvement in strength but patient declined.  - Follow-up MRI of right knee - Continue NSAIDs, Voltaren gel   Patient discussed with Dr. Collier Flowers, MD  [facility]  Internal Medicine, PGY-1 Date 10/25/2023 Time 10:16 AM

## 2023-10-25 NOTE — Assessment & Plan Note (Addendum)
 Presenting for follow-up after being seen on 10/01/23. Right knee xray on 09/09/23 demonstrated mild to moderate tricompartmental OA. Pain remains despite steroid injection in February. Today, pain was reproduced with McMurray test. Swelling of medial and lateral portion of knee was also present. Concern remains for possible meniscal injury.  Order placed for MRI of right knee but has been placed but not scheduled. Will follow-up regarding the status of MRI of the knee. Recommended PT for improvement in strength but patient declined.  - Follow-up MRI of right knee - Continue NSAIDs, Voltaren gel

## 2023-10-25 NOTE — Patient Instructions (Signed)
 Thank you so much for coming to the clinic today!   I will reach out to our scheduling coordinator regarding the MRI.   If you have any questions please feel free to the call the clinic at anytime at [phone]. It was a pleasure seeing you!  Best, Dr. Rayvon Char

## 2023-10-28 ENCOUNTER — Ambulatory Visit (HOSPITAL_COMMUNITY)
Admission: RE | Admit: 2023-10-28 | Discharge: 2023-10-28 | Disposition: A | Discharge status: Home / Self Care | Source: Ambulatory Visit | Attending: Internal Medicine | Admitting: Internal Medicine

## 2023-10-28 DIAGNOSIS — G8929 Other chronic pain: Secondary | ICD-10-CM | POA: Insufficient documentation

## 2023-10-28 DIAGNOSIS — M25561 Pain in right knee: Secondary | ICD-10-CM | POA: Insufficient documentation

## 2023-10-29 ENCOUNTER — Ambulatory Visit
Admission: RE | Admit: 2023-10-29 | Discharge: 2023-10-29 | Disposition: A | Discharge status: Home / Self Care | Payer: 59 | Source: Ambulatory Visit | Attending: Internal Medicine | Admitting: Internal Medicine

## 2023-10-29 DIAGNOSIS — Z720 Tobacco use: Secondary | ICD-10-CM

## 2023-10-30 ENCOUNTER — Other Ambulatory Visit: Payer: Self-pay | Admitting: Internal Medicine

## 2023-10-30 DIAGNOSIS — E119 Type 2 diabetes mellitus without complications: Secondary | ICD-10-CM

## 2023-11-01 NOTE — Telephone Encounter (Signed)
 Medication sent to pharmacy

## 2023-11-03 NOTE — Progress Notes (Signed)
 Internal Medicine Clinic Attending  Case discussed with the resident at the time of the visit.  We reviewed the resident's history and exam and pertinent patient test results.  I agree with the assessment, diagnosis, and plan of care documented in the resident's note.

## 2023-11-14 ENCOUNTER — Other Ambulatory Visit: Payer: Self-pay | Admitting: Internal Medicine

## 2023-11-14 DIAGNOSIS — J449 Chronic obstructive pulmonary disease, unspecified: Secondary | ICD-10-CM

## 2023-11-16 NOTE — Addendum Note (Signed)
 Addended by: Lucille Passy on: 11/16/2023 03:11 PM   Modules accepted: Orders

## 2023-11-17 ENCOUNTER — Other Ambulatory Visit: Payer: Self-pay | Admitting: Internal Medicine

## 2023-11-17 DIAGNOSIS — G8929 Other chronic pain: Secondary | ICD-10-CM

## 2023-11-28 ENCOUNTER — Other Ambulatory Visit: Payer: Self-pay | Admitting: Internal Medicine

## 2023-11-28 DIAGNOSIS — E0590 Thyrotoxicosis, unspecified without thyrotoxic crisis or storm: Secondary | ICD-10-CM

## 2023-12-04 ENCOUNTER — Other Ambulatory Visit: Payer: Self-pay | Admitting: Internal Medicine

## 2023-12-04 DIAGNOSIS — K219 Gastro-esophageal reflux disease without esophagitis: Secondary | ICD-10-CM

## 2023-12-06 NOTE — Telephone Encounter (Signed)
 Medication sent to pharmacy

## 2023-12-13 ENCOUNTER — Other Ambulatory Visit: Payer: Self-pay | Admitting: Internal Medicine

## 2023-12-13 DIAGNOSIS — R350 Frequency of micturition: Secondary | ICD-10-CM

## 2023-12-13 DIAGNOSIS — J449 Chronic obstructive pulmonary disease, unspecified: Secondary | ICD-10-CM

## 2023-12-13 NOTE — Telephone Encounter (Signed)
 Medication sent to pharmacy

## 2023-12-15 ENCOUNTER — Other Ambulatory Visit: Payer: Self-pay | Admitting: Internal Medicine

## 2023-12-15 DIAGNOSIS — G8929 Other chronic pain: Secondary | ICD-10-CM

## 2023-12-25 ENCOUNTER — Other Ambulatory Visit: Payer: Self-pay | Admitting: Internal Medicine

## 2023-12-25 DIAGNOSIS — E0590 Thyrotoxicosis, unspecified without thyrotoxic crisis or storm: Secondary | ICD-10-CM

## 2024-01-06 ENCOUNTER — Other Ambulatory Visit: Payer: Self-pay | Admitting: Internal Medicine

## 2024-01-06 DIAGNOSIS — I10 Essential (primary) hypertension: Secondary | ICD-10-CM

## 2024-01-06 NOTE — Telephone Encounter (Signed)
 Medication sent to pharmacy

## 2024-01-08 ENCOUNTER — Other Ambulatory Visit: Payer: Self-pay | Admitting: Internal Medicine

## 2024-01-08 DIAGNOSIS — R350 Frequency of micturition: Secondary | ICD-10-CM

## 2024-01-08 DIAGNOSIS — I10 Essential (primary) hypertension: Secondary | ICD-10-CM

## 2024-01-10 ENCOUNTER — Other Ambulatory Visit: Payer: Self-pay | Admitting: Internal Medicine

## 2024-01-10 DIAGNOSIS — J449 Chronic obstructive pulmonary disease, unspecified: Secondary | ICD-10-CM

## 2024-01-10 DIAGNOSIS — G8929 Other chronic pain: Secondary | ICD-10-CM

## 2024-01-26 ENCOUNTER — Encounter: Payer: Self-pay | Admitting: *Deleted

## 2024-01-26 ENCOUNTER — Other Ambulatory Visit: Payer: Self-pay | Admitting: Internal Medicine

## 2024-01-26 DIAGNOSIS — E119 Type 2 diabetes mellitus without complications: Secondary | ICD-10-CM

## 2024-02-07 ENCOUNTER — Other Ambulatory Visit: Payer: Self-pay | Admitting: Internal Medicine

## 2024-02-07 DIAGNOSIS — G8929 Other chronic pain: Secondary | ICD-10-CM

## 2024-02-07 DIAGNOSIS — E1142 Type 2 diabetes mellitus with diabetic polyneuropathy: Secondary | ICD-10-CM

## 2024-02-07 DIAGNOSIS — E119 Type 2 diabetes mellitus without complications: Secondary | ICD-10-CM

## 2024-02-08 ENCOUNTER — Telehealth: Payer: Self-pay | Admitting: *Deleted

## 2024-02-08 NOTE — Telephone Encounter (Signed)
 Return call to pt's wife - no answer; left message of office's call.

## 2024-02-08 NOTE — Telephone Encounter (Signed)
 Copied from CRM [id number]. Topic: Clinical - Prescription Issue >> Feb 08, 2024  4:17 PM Graeme ORN wrote: Reason for CRM: Patient wife called. She is at pharmacy and they stated they have not received a response for refill request. Per notes refill denied change not appropriate. Requested pregabalin  (LYRICA ) 150 MG capsule and traMADol  (ULTRAM ) 50 MG tablet. Thank You

## 2024-03-06 ENCOUNTER — Ambulatory Visit: Admitting: Dietician

## 2024-03-06 ENCOUNTER — Ambulatory Visit (INDEPENDENT_AMBULATORY_CARE_PROVIDER_SITE_OTHER): Admitting: Student

## 2024-03-06 ENCOUNTER — Ambulatory Visit: Payer: Self-pay

## 2024-03-06 VITALS — BP 132/79 | HR 63 | Temp 98.1°F | Ht 72.0 in | Wt 204.2 lb

## 2024-03-06 DIAGNOSIS — E119 Type 2 diabetes mellitus without complications: Secondary | ICD-10-CM

## 2024-03-06 DIAGNOSIS — G8929 Other chronic pain: Secondary | ICD-10-CM | POA: Diagnosis not present

## 2024-03-06 DIAGNOSIS — E1142 Type 2 diabetes mellitus with diabetic polyneuropathy: Secondary | ICD-10-CM

## 2024-03-06 DIAGNOSIS — M898X1 Other specified disorders of bone, shoulder: Secondary | ICD-10-CM | POA: Diagnosis not present

## 2024-03-06 MED ORDER — TRAMADOL HCL 50 MG PO TABS: 50.0000 mg | ORAL_TABLET | Freq: Two times a day (BID) | ORAL | 0 refills | Status: DC | PRN

## 2024-03-06 MED ORDER — PREGABALIN 150 MG PO CAPS: 150.0000 mg | ORAL_CAPSULE | Freq: Two times a day (BID) | ORAL | 3 refills | Status: DC

## 2024-03-06 NOTE — Telephone Encounter (Signed)
 FYI Only or Action Required?: FYI only for provider.  Patient was last seen in primary care on 10/25/2023 by Stephanie Freund, MD.  Called Nurse Triage reporting Shoulder pain. Stabbing pain in left shoulder blade. Hurts more with a deep breath.  Symptoms began several days ago.  Interventions attempted:  Tylenol   Symptoms are: gradually worsening.  Triage Disposition: See PCP When Office is Open (Within 3 Days)  Patient/caregiver understands and will follow disposition?: Yes             Copied from CRM [id number]. Topic: Clinical - Red Word Triage >> Mar 06, 2024 10:10 AM Laurier C wrote: Red Word that prompted transfer to Nurse Triage: Patient has been having a sharp/ constant pain in his left should (blade) Pain level is an 8. Patient is unable to sleep and barley lift arm. Reason for Disposition  [1] MODERATE pain (e.g., interferes with normal activities) AND [2] present > 3 days  Answer Assessment - Initial Assessment Questions 1. ONSET: When did the pain start?     1 week 2. LOCATION: Where is the pain located?     Left shoulder blade 3. PAIN: How bad is the pain? (Scale 1-10; or mild, moderate, severe)     7-8/10 4. WORK OR EXERCISE: Has there been any recent work or exercise that involved this part of the body?     no 5. CAUSE: What do you think is causing the shoulder pain?     unsure 6. OTHER SYMPTOMS: Do you have any other symptoms? (e.g., neck pain, swelling, rash, fever, numbness, weakness)     No - hurts with deep breath  Protocols used: Shoulder Pain-A-AH

## 2024-03-06 NOTE — Progress Notes (Unsigned)
 CC: Left-sided scapular pain  HPI:  Jesus Martin is a 63 y.o. male living with a history stated below and presents today for follow-up. Please see problem based assessment and plan for additional details.  Past Medical History:  Diagnosis Date   Bipolar I disorder [facility]) 1991   Dr. Vincente   Borderline hyperlipidemia    history of   COPD (chronic obstructive pulmonary disease) (HCC)    Diabetes mellitus, new onset (HCC) 12/22/2017   Fibromyalgia    GERD (gastroesophageal reflux disease)    HTN (hypertension)    Lumbar vertebral fracture (HCC) 01/29/2019   L1   Tobacco abuse    Viral conjunctivitis of both eyes 08/14/2022   Onset 2 days ago. Eyes are itchy and painful. Pt having tearing of both eyes.    Vitamin D  deficiency     Current Outpatient Medications on File Prior to Visit  Medication Sig Dispense Refill   Accu-Chek FastClix Lancets MISC Check blood sugar 3 times a day. 102 each 11   amLODipine  (NORVASC ) 10 MG tablet Take 1 tablet by mouth once daily 90 tablet 3   atorvastatin  (LIPITOR) 40 MG tablet TAKE 1 TABLET BY MOUTH ONCE DAILY AT  6  PM. 90 tablet 0   blood glucose meter kit and supplies KIT Dispense based on patient and insurance preference. Use up to four times daily as directed. (FOR ICD-9 250.00, 250.01). 1 each 0   Blood Glucose Monitoring Suppl (ACCU-CHEK GUIDE) w/Device KIT 1 each by Does not apply route 3 (three) times daily. 1 kit 1   carvedilol  (COREG ) 12.5 MG tablet TAKE 1 TABLET BY MOUTH TWICE DAILY WITH A MEAL 180 tablet 0   diclofenac  sodium (VOLTAREN ) 1 % GEL Apply 2 g topically 4 (four) times daily. 350 g 6   Empagliflozin-metFORMIN  HCl (SYNJARDY ) 12-998 MG TABS Take 1 tablet by mouth twice daily 180 tablet 3   EPINEPHrine 0.3 mg/0.3 mL IJ SOAJ injection Inject 0.3 mg into the muscle once as needed (for anaphylactic reaction to bee stings).     Fluticasone -Umeclidin-Vilant (TRELEGY ELLIPTA ) 100-62.5-25 MCG/ACT AEPB Inhale 1 puff by mouth once  daily 60 each 3   glucose blood (ACCU-CHEK GUIDE) test strip USE 1  THREE TIMES DAILY TO CHECK BLOOD SUGAR 100 each 9   hydrocortisone  1 % ointment APPLY  OINTMENT EXTERNALLY TWICE DAILY 29 g 0   ipratropium-albuterol  (DUONEB) 0.5-2.5 (3) MG/3ML SOLN Take 3 mLs by nebulization every 4 (four) hours as needed. 360 mL 1   lamoTRIgine  (LAMICTAL ) 150 MG tablet Take 150 mg by mouth 2 (two) times daily.     losartan -hydrochlorothiazide (HYZAAR) 100-25 MG tablet Take 1 tablet by mouth once daily 90 tablet 3   methimazole  (TAPAZOLE ) 10 MG tablet Take 1 tablet (10 mg total) by mouth daily. 90 tablet 0   methocarbamol  (ROBAXIN -750) 750 MG tablet Take 1 tablet (750 mg total) by mouth every 6 (six) hours as needed for muscle spasms. 60 tablet 0   multivitamin (ONE-A-DAY MEN'S) TABS tablet Take 1 tablet by mouth daily.     nicotine  polacrilex (NICORETTE ) 4 MG gum Take 1 each (4 mg total) by mouth as needed for smoking cessation. 100 tablet 0   omeprazole  (PRILOSEC) 40 MG capsule Take 1 capsule by mouth once daily 90 capsule 0   predniSONE  (DELTASONE ) 20 MG tablet Take 1 tablet (20 mg total) by mouth daily with breakfast. 5 tablet 0   QUEtiapine  (SEROQUEL ) 50 MG tablet Take 225 mg by mouth  at bedtime.     tamsulosin  (FLOMAX ) 0.4 MG CAPS capsule Take 1 capsule by mouth once daily 90 capsule 3   VENTOLIN  HFA 108 (90 Base) MCG/ACT inhaler INHALE 2 PUFFS BY MOUTH EVERY 6 HOURS AS NEEDED FOR WHEEZING OR SHORTNESS OF BREATH 18 g 3   No current facility-administered medications on file prior to visit.    Family History  Adopted: Yes    Social History   Socioeconomic History   Marital status: Married    Spouse name: [name]   Number of children: 2   Years of education: Not on file   Highest education level: Not on file  Occupational History   Occupation: disabled  Tobacco Use   Smoking status: Every Day    Current packs/day: 0.75    Average packs/day: 0.8 packs/day for 45.0 years (33.8 ttl pk-yrs)     Types: Cigarettes   Smokeless tobacco: Never   Tobacco comments:    3/4 pack smoked daily [facility]J 12/09/21  Vaping Use   Vaping status: Never Used  Substance and Sexual Activity   Alcohol use: Yes    Comment: Beer sometimes.   Drug use: No   Sexual activity: Not on file  Other Topics Concern   Not on file  Social History Narrative   Lives with wife Per Beagley (married 28 years) and son. Has 4 dogs and fish. Not employed. Woodworking for fun. HS graduate. Quit smoking in 2008. 26 pack years. No recreational drug use. Drinks 1 glass of wine per month. Does not exercise. Custody of 3 other children, 10, 11, 15.   Social Drivers of Health   Financial Resource Strain: High Risk (03/03/2023)   Overall Financial Resource Strain (CARDIA)    Difficulty of Paying Living Expenses: Hard  Food Insecurity: Food Insecurity Present (03/03/2023)   Hunger Vital Sign    Worried About Running Out of Food in the Last Year: Sometimes true    Ran Out of Food in the Last Year: Sometimes true  Transportation Needs: No Transportation Needs (03/03/2023)   PRAPARE - Administrator, Civil Service (Medical): No    Lack of Transportation (Non-Medical): No  Physical Activity: Sufficiently Active (03/03/2023)   Exercise Vital Sign    Days of Exercise per Week: 7 days    Minutes of Exercise per Session: 60 min  Stress: Stress Concern Present (03/03/2023)   [employer] of Occupational Health - Occupational Stress Questionnaire    Feeling of Stress : Rather much  Social Connections: Unknown (03/03/2023)   Social Connection and Isolation Panel    Frequency of Communication with Friends and Family: Once a week    Frequency of Social Gatherings with Friends and Family: Not on file    Attends Religious Services: 1 to 4 times per year    Active Member of [employer] or Organizations: No    Attends Banker Meetings: Never    Marital Status: Married  Catering manager Violence: Not At Risk  (03/03/2023)   Humiliation, Afraid, Rape, and Kick questionnaire    Fear of Current or Ex-Partner: No    Emotionally Abused: No    Physically Abused: No    Sexually Abused: No    Review of Systems: ROS negative except for what is noted on the assessment and plan.  Vitals:   03/06/24 1559  BP: 132/79  Pulse: 63  Temp: 98.1 F (36.7 C)  TempSrc: Oral  SpO2: 97%  Weight: 204 lb 3.2 oz (92.6 kg)  Height:  6' (1.829 m)    Physical Exam: Constitutional: well-appearing, sitting in chair, in no acute distress Cardiovascular: regular rate and rhythm, no m/r/g Pulmonary/Chest: normal work of breathing on room air, lungs clear to auscultation bilaterally MSK: no overt, shoulder/scapular deformity/winging, painful TTP along inner left scapula, good range of motion Skin: warm and dry Psych: normal mood and behavior  Assessment & Plan:     Patient discussed with Dr. Jeanelle  Pain of left scapula Began last week.  Pain is described as along the inner scapula, constant, 7/10, stabbing, without clear exacerbating or alleviating factors.  Has tried IcyHot pads, Tylenol , Robaxin  without relief.  Unremarkable physical exam aside from pain.  He does have extensive spinal pathology history. A 2022 MRI for posterior neck pain showed lower cervical spondylosis, with right predominant neural foraminal narrowing from C4-5 through C6-7.  He saw neurosurgery for this who did not recommend surgical intervention.  He has been following with PT and performing neck exercises but recently stopped.  Also stopped taking Lyrica  soon before pain began.  Given this and physical exam, presentation consistent with radicular pattern.  I have refilled Lyrica  and advised patient to continue PT exercises.  Toradol also refilled for breakthrough pain.  Patient will follow-up in 2 weeks.   Norman Lobstein, D.O. [facility] Internal Medicine, PGY-2 Phone: [phone] Date 03/07/2024 Time 8:55 AM

## 2024-03-07 DIAGNOSIS — M898X1 Other specified disorders of bone, shoulder: Secondary | ICD-10-CM | POA: Insufficient documentation

## 2024-03-07 NOTE — Assessment & Plan Note (Addendum)
 Began last week.  Pain is described as along the inner scapula, constant, 7/10, stabbing, without clear exacerbating or alleviating factors.  Has tried IcyHot pads, Tylenol , Robaxin  without relief.  Unremarkable physical exam aside from pain.  He does have extensive spinal pathology history. A 2022 MRI for posterior neck pain showed lower cervical spondylosis, with right predominant neural foraminal narrowing from C4-5 through C6-7.  He saw neurosurgery for this who did not recommend surgical intervention.  He has been following with PT and performing neck exercises but recently stopped.  Also stopped taking Lyrica  soon before pain began.  Given this and physical exam, presentation consistent with radicular pattern.  I have refilled Lyrica  and advised patient to continue PT exercises.  Toradol also refilled for breakthrough pain.  Patient will follow-up in 2 weeks.

## 2024-03-08 ENCOUNTER — Ambulatory Visit: Payer: Medicare PPO

## 2024-03-08 VITALS — Ht 72.0 in | Wt 204.0 lb

## 2024-03-08 DIAGNOSIS — Z0000 Encounter for general adult medical examination without abnormal findings: Secondary | ICD-10-CM | POA: Diagnosis not present

## 2024-03-08 NOTE — Progress Notes (Signed)
 Internal Medicine Clinic Attending  Case discussed with the resident at the time of the visit.  We reviewed the resident's history and exam and pertinent patient test results.  I agree with the assessment, diagnosis, and plan of care documented in the resident's note.

## 2024-03-08 NOTE — Patient Instructions (Addendum)
 Mr. Kelnhofer , Thank you for taking time out of your busy schedule to complete your Annual Wellness Visit with me. I enjoyed our conversation and look forward to speaking with you again next year. I, as well as your care team,  appreciate your ongoing commitment to your health goals. Please review the following plan we discussed and let me know if I can assist you in the future. Your Game plan/ To Do List    Referrals: If you haven't heard from the office you've been referred to, please reach out to them at the phone provided.   Follow up Visits: Next Medicare AWV with our clinical staff: 03/14/2025 at 11:50 a.m. phone visit with nurse Health Advisor   Have you seen your provider in the last 6 months (3 months if uncontrolled diabetes)? Yes Next Office Visit with your provider: 03/21/2024 at 9:15 a.m. office visit with Dr. Norman Lobstein  Clinician Recommendations:  Aim for 30 minutes of exercise or brisk walking, 6-8 glasses of water, and 5 servings of fruits and vegetables each day.       This is a list of the screening recommended for you and due dates:  Health Maintenance  Topic Date Due   Zoster (Shingles) Vaccine (1 of 2) Never done   COVID-19 Vaccine (3 - Pfizer risk series) 05/14/2020   Eye exam for diabetics  11/13/2022   Complete foot exam   08/13/2023   Hemoglobin A1C  03/01/2024   Flu Shot  03/10/2024   Yearly kidney function blood test for diabetes  09/01/2024   Yearly kidney health urinalysis for diabetes  09/30/2024   Screening for Lung Cancer  10/28/2024   Medicare Annual Wellness Visit  03/08/2025   DTaP/Tdap/Td vaccine (3 - Td or Tdap) 02/04/2032   Colon Cancer Screening  02/22/2033   Pneumococcal Vaccination  Completed   Hepatitis C Screening  Completed   HIV Screening  Completed   Hepatitis B Vaccine  Aged Out   HPV Vaccine  Aged Out   Meningitis B Vaccine  Aged Out    Advanced directives: (Declined) Advance directive discussed with you today. Even though you  declined this today, please call our office should you change your mind, and we can give you the proper paperwork for you to fill out. Advance Care Planning is important because it:  [x]  Makes sure you receive the medical care that is consistent with your values, goals, and preferences  [x]  It provides guidance to your family and loved ones and reduces their decisional burden about whether or not they are making the right decisions based on your wishes.  Follow the link provided in your after visit summary or read over the paperwork we have mailed to you to help you started getting your Advance Directives in place. If you need assistance in completing these, please reach out to us  so that we can help you!  See attachments for Preventive Care and Fall Prevention Tips.

## 2024-03-08 NOTE — Progress Notes (Addendum)
 Because this visit was a virtual/telehealth visit,  certain criteria was not obtained, such a blood pressure, CBG if applicable, and timed get up and go. Any medications not marked as taking were not mentioned during the medication reconciliation part of the visit. Any vitals not documented were not able to be obtained due to this being a telehealth visit or patient was unable to self-report a recent blood pressure reading due to a lack of equipment at home via telehealth. Vitals that have been documented are verbally provided by the patient.   Subjective:   Jesus Martin is a 63 y.o. who presents for a Medicare Wellness preventive visit.  As a reminder, Annual Wellness Visits don't include a physical exam, and some assessments may be limited, especially if this visit is performed virtually. We may recommend an in-person follow-up visit with your provider if needed.  Visit Complete: Virtual I connected with  Jesus Martin on 03/08/24 by a audio enabled telemedicine application and verified that I am speaking with the correct person using two identifiers.  Patient Location: Home  Provider Location: Home Office  I discussed the limitations of evaluation and management by telemedicine. The patient expressed understanding and agreed to proceed.  Vital Signs: Because this visit was a virtual/telehealth visit, some criteria may be missing or patient reported. Any vitals not documented were not able to be obtained and vitals that have been documented are patient reported.  VideoDeclined- This patient declined Librarian, academic. Therefore the visit was completed with audio only.  Persons Participating in Visit: Patient.  AWV Questionnaire: No: Patient Medicare AWV questionnaire was not completed prior to this visit.  Cardiac Risk Factors include: advanced age (>50men, >33 women);diabetes mellitus;dyslipidemia;hypertension;male gender     Objective:     Today's Vitals   03/08/24 1117  Weight: 204 lb (92.5 kg)  Height: 6' (1.829 m)  PainSc: 0-No pain   Body mass index is 27.67 kg/m.     03/08/2024   11:21 AM 10/01/2023   11:18 AM 04/14/2023   10:20 AM 03/03/2023   11:30 AM 09/28/2022   10:01 AM 09/08/2022   10:25 AM 08/12/2022    9:53 AM  Advanced Directives  Does Patient Have a Medical Advance Directive? No No No Yes No No No  Type of Theme park manager;Living will     Copy of Healthcare Power of Attorney in Chart?    No - copy requested     Would patient like information on creating a medical advance directive? No - Patient declined No - Patient declined No - Patient declined  No - Patient declined No - Patient declined No - Patient declined    Current Medications (verified) Outpatient Encounter Medications as of 03/08/2024  Medication Sig   Accu-Chek FastClix Lancets MISC Check blood sugar 3 times a day.   amLODipine  (NORVASC ) 10 MG tablet Take 1 tablet by mouth once daily   atorvastatin  (LIPITOR) 40 MG tablet TAKE 1 TABLET BY MOUTH ONCE DAILY AT  6  PM.   blood glucose meter kit and supplies KIT Dispense based on patient and insurance preference. Use up to four times daily as directed. (FOR ICD-9 250.00, 250.01).   Blood Glucose Monitoring Suppl (ACCU-CHEK GUIDE) w/Device KIT 1 each by Does not apply route 3 (three) times daily.   carvedilol  (COREG ) 12.5 MG tablet TAKE 1 TABLET BY MOUTH TWICE DAILY WITH A MEAL   diclofenac  sodium (VOLTAREN ) 1 % GEL  Apply 2 g topically 4 (four) times daily.   Empagliflozin-metFORMIN  HCl (SYNJARDY ) 12-998 MG TABS Take 1 tablet by mouth twice daily   EPINEPHrine 0.3 mg/0.3 mL IJ SOAJ injection Inject 0.3 mg into the muscle once as needed (for anaphylactic reaction to bee stings).   Fluticasone -Umeclidin-Vilant (TRELEGY ELLIPTA ) 100-62.5-25 MCG/ACT AEPB Inhale 1 puff by mouth once daily   glucose blood (ACCU-CHEK GUIDE) test strip USE 1  THREE TIMES DAILY TO CHECK BLOOD  SUGAR   hydrocortisone  1 % ointment APPLY  OINTMENT EXTERNALLY TWICE DAILY   ipratropium-albuterol  (DUONEB) 0.5-2.5 (3) MG/3ML SOLN Take 3 mLs by nebulization every 4 (four) hours as needed.   lamoTRIgine  (LAMICTAL ) 150 MG tablet Take 150 mg by mouth 2 (two) times daily.   losartan -hydrochlorothiazide (HYZAAR) 100-25 MG tablet Take 1 tablet by mouth once daily   methimazole  (TAPAZOLE ) 10 MG tablet Take 1 tablet (10 mg total) by mouth daily.   methocarbamol  (ROBAXIN -750) 750 MG tablet Take 1 tablet (750 mg total) by mouth every 6 (six) hours as needed for muscle spasms.   multivitamin (ONE-A-DAY MEN'S) TABS tablet Take 1 tablet by mouth daily.   nicotine  polacrilex (NICORETTE ) 4 MG gum Take 1 each (4 mg total) by mouth as needed for smoking cessation.   omeprazole  (PRILOSEC) 40 MG capsule Take 1 capsule by mouth once daily   predniSONE  (DELTASONE ) 20 MG tablet Take 1 tablet (20 mg total) by mouth daily with breakfast.   pregabalin  (LYRICA ) 150 MG capsule Take 1 capsule (150 mg total) by mouth 2 (two) times daily.   QUEtiapine  (SEROQUEL ) 50 MG tablet Take 225 mg by mouth at bedtime.   tamsulosin  (FLOMAX ) 0.4 MG CAPS capsule Take 1 capsule by mouth once daily   traMADol  (ULTRAM ) 50 MG tablet Take 1 tablet (50 mg total) by mouth 2 (two) times daily as needed.   VENTOLIN  HFA 108 (90 Base) MCG/ACT inhaler INHALE 2 PUFFS BY MOUTH EVERY 6 HOURS AS NEEDED FOR WHEEZING OR SHORTNESS OF BREATH   No facility-administered encounter medications on file as of 03/08/2024.    Allergies (verified) Yellow jacket venom [bee venom] and 5-alpha reductase inhibitors   History: Past Medical History:  Diagnosis Date   Bipolar I disorder (HCC) 1991   Dr. Vincente   Borderline hyperlipidemia    history of   COPD (chronic obstructive pulmonary disease) (HCC)    Diabetes mellitus, new onset (HCC) 12/22/2017   Fibromyalgia    GERD (gastroesophageal reflux disease)    HTN (hypertension)    Lumbar vertebral fracture  (HCC) 01/29/2019   L1   Tobacco abuse    Viral conjunctivitis of both eyes 08/14/2022   Onset 2 days ago. Eyes are itchy and painful. Pt having tearing of both eyes.    Vitamin D  deficiency    Past Surgical History:  Procedure Laterality Date   APPENDECTOMY     1975   BACK SURGERY     1998   NOSE SURGERY     Family History  Adopted: Yes   Social History   Socioeconomic History   Marital status: Married    Spouse name: [name]   Number of children: 2   Years of education: Not on file   Highest education level: Not on file  Occupational History   Occupation: disabled  Tobacco Use   Smoking status: Former    Current packs/day: 0.00    Average packs/day: 0.8 packs/day for 45.0 years (33.8 ttl pk-yrs)    Types: Cigarettes    Quit  date: 10/01/2023    Years since quitting: 0.4   Smokeless tobacco: Never   Tobacco comments:    3/4 pack smoked daily [facility]J 12/09/21    Patient and wife quit smoking 10/01/2023.  Vaping Use   Vaping status: Never Used  Substance and Sexual Activity   Alcohol use: Not Currently    Comment: Beer sometimes.   Drug use: No   Sexual activity: Not on file  Other Topics Concern   Not on file  Social History Narrative   Lives with wife Marquise Wicke (married 28 years) and son. Has 4 dogs and fish. Not employed. Woodworking for fun. HS graduate. Quit smoking in 2008. 26 pack years. No recreational drug use. Drinks 1 glass of wine per month. Does not exercise. Custody of 3 other children, 10, 11, 15.   Social Drivers of Corporate investment banker Strain: High Risk (03/08/2024)   Overall Financial Resource Strain (CARDIA)    Difficulty of Paying Living Expenses: Hard  Food Insecurity: No Food Insecurity (03/08/2024)   Hunger Vital Sign    Worried About Running Out of Food in the Last Year: Never true    Ran Out of Food in the Last Year: Never true  Transportation Needs: No Transportation Needs (03/08/2024)   PRAPARE - Scientist, research (physical sciences) (Medical): No    Lack of Transportation (Non-Medical): No  Physical Activity: Sufficiently Active (03/08/2024)   Exercise Vital Sign    Days of Exercise per Week: 7 days    Minutes of Exercise per Session: 60 min  Stress: Stress Concern Present (03/08/2024)   [employer] of Occupational Health - Occupational Stress Questionnaire    Feeling of Stress: Rather much  Social Connections: Unknown (03/08/2024)   Social Connection and Isolation Panel    Frequency of Communication with Friends and Family: Once a week    Frequency of Social Gatherings with Friends and Family: Not on file    Attends Religious Services: 1 to 4 times per year    Active Member of [employer] or Organizations: No    Attends Banker Meetings: Never    Marital Status: Married    Tobacco Counseling Counseling given: Not Answered Tobacco comments: 3/4 pack smoked daily ARJ 12/09/21 Patient and wife quit smoking 10/01/2023.    Clinical Intake:  Pre-visit preparation completed: Yes  Pain : No/denies pain Pain Score: 0-No pain     BMI - recorded: 27.67 Nutritional Status: BMI 25 -29 Overweight Nutritional Risks: None Diabetes: Yes CBG done?: No Did pt. bring in CBG monitor from home?: No  Lab Results  Component Value Date   HGBA1C 6.0 (H) 09/02/2023   HGBA1C 5.9 (H) 04/14/2023   HGBA1C 6.4 (A) 12/22/2022     How often do you need to have someone help you when you read instructions, pamphlets, or other written materials from your doctor or pharmacy?: 1 - Never What is the last grade level you completed in school?: HSG  Interpreter Needed?: No  Information entered by :: Morse Brueggemann N. Chriselda Leppert, LPN.   Activities of Daily Living     03/08/2024   11:24 AM 10/01/2023   11:18 AM  In your present state of health, do you have any difficulty performing the following activities:  Hearing? 0 0  Vision? 0 1  Difficulty concentrating or making decisions? 1 1  Comment BSE: Reads Bible,  Games on [employer] , etc.   Walking or climbing stairs? 0 1  Dressing  or bathing? 0 0  Doing errands, shopping? 0 0  Preparing Food and eating ? N   Using the Toilet? N   In the past six months, have you accidently leaked urine? N   Do you have problems with loss of bowel control? N   Managing your Medications? N   Managing your Finances? N   Housekeeping or managing your Housekeeping? N     Patient Care Team: Benuel Braun, DO as PCP - General (Internal Medicine) Frederik Charleston, MD as Referring Physician (Family Medicine) Charmayne Molly, MD as Consulting Physician (Ophthalmology) [facility] as Consulting Physician (Optometry)  I have updated your Care Teams any recent Medical Services you may have received from other providers in the past year.     Assessment:   This is a routine wellness examination for Jesus Martin.  Hearing/Vision screen Hearing Screening - Comments:: Denies hearing difficulties. Vision Screening - Comments:: Wears rx glasses - up to date with routine eye exams with [facility]   Goals Addressed             This Visit's Progress    03/08/2024: To have knee and neck surgery.  Get back to camping and hiking.         Depression Screen     03/08/2024   11:22 AM 10/01/2023   11:18 AM 05/14/2023   11:12 AM 04/14/2023   10:20 AM 03/03/2023   11:37 AM 12/22/2022   10:42 AM 09/28/2022   10:49 AM  PHQ 2/9 Scores  PHQ - 2 Score 0 0  0 3 1   PHQ- 9 Score 2   0 10 8   Exception Documentation   Patient refusal    Patient refusal    Fall Risk     03/08/2024   11:22 AM 10/25/2023    9:50 AM 10/01/2023   11:17 AM 04/14/2023   10:20 AM 03/03/2023   11:31 AM  Fall Risk   Falls in the past year? 0 0 0 0 0  Number falls in past yr: 0 0 0 0 0  Injury with Fall? 0 0 0 0 0  Risk for fall due to : No Fall Risks No Fall Risks No Fall Risks No Fall Risks No Fall Risks;Medication side effect  Follow up Falls  evaluation completed Falls evaluation completed;Falls prevention discussed Falls evaluation completed;Falls prevention discussed Falls evaluation completed;Falls prevention discussed Falls prevention discussed    MEDICARE RISK AT HOME:  Medicare Risk at Home Any stairs in or around the home?: Yes (Ramp at front entrance, 4 steps back entrance) If so, are there any without handrails?: No Home free of loose throw rugs in walkways, pet beds, electrical cords, etc?: Yes Adequate lighting in your home to reduce risk of falls?: Yes Life alert?: No Use of a cane, walker or w/c?: No Grab bars in the bathroom?: No Shower chair or bench in shower?: Yes (PRN) Elevated toilet seat or a handicapped toilet?: Yes  TIMED UP AND GO:  Was the test performed?  No  Cognitive Function: 6CIT completed    03/08/2024   11:23 AM  MMSE - Mini Mental State Exam  Not completed: Unable to complete        03/08/2024   11:27 AM 03/03/2023   11:32 AM 02/03/2022   11:36 AM  6CIT Screen  What Year? 0 points 0 points 0 points  What month? 0 points 0 points 0 points  What time? 0 points 0 points  0 points  Count back from 20 0 points 0 points 0 points  Months in reverse 0 points 0 points 0 points  Repeat phrase 0 points 4 points 0 points  Total Score 0 points 4 points 0 points    Immunizations Immunization History  Administered Date(s) Administered   Influenza Split 06/24/2011   Influenza, Seasonal, Injecte, Preservative Fre 04/14/2023   Influenza,inj,Quad PF,6+ Mos 03/28/2018, 05/08/2019, 06/12/2020, 04/21/2021   PFIZER(Purple Top)SARS-COV-2 Vaccination 03/22/2020, 04/16/2020   PNEUMOCOCCAL CONJUGATE-20 04/14/2023   Pneumococcal Polysaccharide-23 09/23/2017   Tdap 06/24/2011, 02/03/2022    Screening Tests Health Maintenance  Topic Date Due   Zoster Vaccines- Shingrix (1 of 2) Never done   COVID-19 Vaccine (3 - Pfizer risk series) 05/14/2020   OPHTHALMOLOGY EXAM  11/13/2022   FOOT EXAM  08/13/2023    HEMOGLOBIN A1C  03/01/2024   INFLUENZA VACCINE  03/10/2024   Diabetic kidney evaluation - eGFR measurement  09/01/2024   Diabetic kidney evaluation - Urine ACR  09/30/2024   Lung Cancer Screening  10/28/2024   Medicare Annual Wellness (AWV)  03/08/2025   DTaP/Tdap/Td (3 - Td or Tdap) 02/04/2032   Colonoscopy  02/22/2033   Pneumococcal Vaccine 38-24 Years old  Completed   Hepatitis C Screening  Completed   HIV Screening  Completed   Hepatitis B Vaccines  Aged Out   HPV VACCINES  Aged Out   Meningococcal B Vaccine  Aged Out    Health Maintenance  Health Maintenance Due  Topic Date Due   Zoster Vaccines- Shingrix (1 of 2) Never done   COVID-19 Vaccine (3 - Pfizer risk series) 05/14/2020   OPHTHALMOLOGY EXAM  11/13/2022   FOOT EXAM  08/13/2023   HEMOGLOBIN A1C  03/01/2024   Health Maintenance Items Addressed: Yes Patient aware of current care gaps.    Additional Screening:  Vision Screening: Recommended annual ophthalmology exams for early detection of glaucoma and other disorders of the eye. Would you like a referral to an eye doctor? No    Dental Screening: Recommended annual dental exams for proper oral hygiene  Community Resource Referral / Chronic Care Management: CRR required this visit?  No   CCM required this visit?  No   Plan:    I have personally reviewed and noted the following in the patient's chart:   Medical and social history Use of alcohol, tobacco or illicit drugs  Current medications and supplements including opioid prescriptions. Patient is not currently taking opioid prescriptions. Functional ability and status Nutritional status Physical activity Advanced directives List of other physicians Hospitalizations, surgeries, and ER visits in previous 12 months Vitals Screenings to include cognitive, depression, and falls Referrals and appointments  In addition, I have reviewed and discussed with patient certain preventive protocols, quality  metrics, and best practice recommendations. A written personalized care plan for preventive services as well as general preventive health recommendations were provided to patient.   Jesus LOISE Fuller, LPN   [id number]   After Visit Summary: (MyChart) Due to this being a telephonic visit, the after visit summary with patients personalized plan was offered to patient via MyChart   Notes: Patient aware of care gaps.  Patient due for the following Foot Exam, HgA1C and vaccines. Patient stated that his last eye exam was done at [facility].

## 2024-03-21 ENCOUNTER — Ambulatory Visit: Payer: Self-pay | Admitting: Student

## 2024-03-21 VITALS — BP 132/81 | HR 63 | Temp 97.8°F | Ht 72.0 in | Wt 205.6 lb

## 2024-03-21 DIAGNOSIS — Z7984 Long term (current) use of oral hypoglycemic drugs: Secondary | ICD-10-CM

## 2024-03-21 DIAGNOSIS — M1711 Unilateral primary osteoarthritis, right knee: Secondary | ICD-10-CM | POA: Diagnosis not present

## 2024-03-21 DIAGNOSIS — E119 Type 2 diabetes mellitus without complications: Secondary | ICD-10-CM | POA: Diagnosis not present

## 2024-03-21 DIAGNOSIS — M898X1 Other specified disorders of bone, shoulder: Secondary | ICD-10-CM

## 2024-03-21 LAB — GLUCOSE, CAPILLARY: Glucose-Capillary: 110 mg/dL — ABNORMAL HIGH (ref 70–99)

## 2024-03-21 LAB — POCT GLYCOSYLATED HEMOGLOBIN (HGB A1C): Hemoglobin A1C: 5.6 % (ref 4.0–5.6)

## 2024-03-21 MED ORDER — LIDOCAINE 5 % EX PTCH: 1.0000 | MEDICATED_PATCH | CUTANEOUS | 0 refills | Status: AC

## 2024-03-21 NOTE — Progress Notes (Signed)
 CC: Follow-up  HPI:  Jesus Martin is a 63 y.o. male living with a history stated below and presents today for follow-up. Please see problem based assessment and plan for additional details.  Past Medical History:  Diagnosis Date   Bipolar I disorder [facility]) 1991   Dr. Vincente   Borderline hyperlipidemia    history of   COPD (chronic obstructive pulmonary disease) (HCC)    Diabetes mellitus, new onset (HCC) 12/22/2017   Fibromyalgia    GERD (gastroesophageal reflux disease)    HTN (hypertension)    Lumbar vertebral fracture (HCC) 01/29/2019   L1   Tobacco abuse    Viral conjunctivitis of both eyes 08/14/2022   Onset 2 days ago. Eyes are itchy and painful. Pt having tearing of both eyes.    Vitamin D  deficiency     Current Outpatient Medications on File Prior to Visit  Medication Sig Dispense Refill   Accu-Chek FastClix Lancets MISC Check blood sugar 3 times a day. 102 each 11   amLODipine  (NORVASC ) 10 MG tablet Take 1 tablet by mouth once daily 90 tablet 3   atorvastatin  (LIPITOR) 40 MG tablet TAKE 1 TABLET BY MOUTH ONCE DAILY AT  6  PM. 90 tablet 0   blood glucose meter kit and supplies KIT Dispense based on patient and insurance preference. Use up to four times daily as directed. (FOR ICD-9 250.00, 250.01). 1 each 0   Blood Glucose Monitoring Suppl (ACCU-CHEK GUIDE) w/Device KIT 1 each by Does not apply route 3 (three) times daily. 1 kit 1   carvedilol  (COREG ) 12.5 MG tablet TAKE 1 TABLET BY MOUTH TWICE DAILY WITH A MEAL 180 tablet 0   diclofenac  sodium (VOLTAREN ) 1 % GEL Apply 2 g topically 4 (four) times daily. 350 g 6   Empagliflozin-metFORMIN  HCl (SYNJARDY ) 12-998 MG TABS Take 1 tablet by mouth twice daily 180 tablet 3   EPINEPHrine 0.3 mg/0.3 mL IJ SOAJ injection Inject 0.3 mg into the muscle once as needed (for anaphylactic reaction to bee stings).     Fluticasone -Umeclidin-Vilant (TRELEGY ELLIPTA ) 100-62.5-25 MCG/ACT AEPB Inhale 1 puff by mouth once daily 60 each 3    glucose blood (ACCU-CHEK GUIDE) test strip USE 1  THREE TIMES DAILY TO CHECK BLOOD SUGAR 100 each 9   hydrocortisone  1 % ointment APPLY  OINTMENT EXTERNALLY TWICE DAILY 29 g 0   ipratropium-albuterol  (DUONEB) 0.5-2.5 (3) MG/3ML SOLN Take 3 mLs by nebulization every 4 (four) hours as needed. 360 mL 1   lamoTRIgine  (LAMICTAL ) 150 MG tablet Take 150 mg by mouth 2 (two) times daily.     losartan -hydrochlorothiazide (HYZAAR) 100-25 MG tablet Take 1 tablet by mouth once daily 90 tablet 3   methimazole  (TAPAZOLE ) 10 MG tablet Take 1 tablet (10 mg total) by mouth daily. 90 tablet 0   methocarbamol  (ROBAXIN -750) 750 MG tablet Take 1 tablet (750 mg total) by mouth every 6 (six) hours as needed for muscle spasms. 60 tablet 0   multivitamin (ONE-A-DAY MEN'S) TABS tablet Take 1 tablet by mouth daily.     nicotine  polacrilex (NICORETTE ) 4 MG gum Take 1 each (4 mg total) by mouth as needed for smoking cessation. 100 tablet 0   omeprazole  (PRILOSEC) 40 MG capsule Take 1 capsule by mouth once daily 90 capsule 0   predniSONE  (DELTASONE ) 20 MG tablet Take 1 tablet (20 mg total) by mouth daily with breakfast. 5 tablet 0   pregabalin  (LYRICA ) 150 MG capsule Take 1 capsule (150 mg total) by  mouth 2 (two) times daily. 60 capsule 3   QUEtiapine  (SEROQUEL ) 50 MG tablet Take 225 mg by mouth at bedtime.     tamsulosin  (FLOMAX ) 0.4 MG CAPS capsule Take 1 capsule by mouth once daily 90 capsule 3   traMADol  (ULTRAM ) 50 MG tablet Take 1 tablet (50 mg total) by mouth 2 (two) times daily as needed. 30 tablet 0   VENTOLIN  HFA 108 (90 Base) MCG/ACT inhaler INHALE 2 PUFFS BY MOUTH EVERY 6 HOURS AS NEEDED FOR WHEEZING OR SHORTNESS OF BREATH 18 g 3   No current facility-administered medications on file prior to visit.    Family History  Adopted: Yes    Social History   Socioeconomic History   Marital status: Married    Spouse name: [name]   Number of children: 2   Years of education: Not on file   Highest education  level: Not on file  Occupational History   Occupation: disabled  Tobacco Use   Smoking status: Former    Current packs/day: 0.00    Average packs/day: 0.8 packs/day for 45.0 years (33.8 ttl pk-yrs)    Types: Cigarettes    Quit date: 10/01/2023    Years since quitting: 0.4   Smokeless tobacco: Never   Tobacco comments:    3/4 pack smoked daily ARJ 12/09/21    Patient and wife quit smoking 10/01/2023.  Vaping Use   Vaping status: Never Used  Substance and Sexual Activity   Alcohol use: Not Currently    Comment: Beer sometimes.   Drug use: No   Sexual activity: Not on file  Other Topics Concern   Not on file  Social History Narrative   Lives with wife Zimri Brennen (married 28 years) and son. Has 4 dogs and fish. Not employed. Woodworking for fun. HS graduate. Quit smoking in 2008. 26 pack years. No recreational drug use. Drinks 1 glass of wine per month. Does not exercise. Custody of 3 other children, 10, 11, 15.   Social Drivers of Corporate investment banker Strain: High Risk (03/08/2024)   Overall Financial Resource Strain (CARDIA)    Difficulty of Paying Living Expenses: Hard  Food Insecurity: No Food Insecurity (03/08/2024)   Hunger Vital Sign    Worried About Running Out of Food in the Last Year: Never true    Ran Out of Food in the Last Year: Never true  Transportation Needs: No Transportation Needs (03/08/2024)   PRAPARE - Administrator, Civil Service (Medical): No    Lack of Transportation (Non-Medical): No  Physical Activity: Sufficiently Active (03/08/2024)   Exercise Vital Sign    Days of Exercise per Week: 7 days    Minutes of Exercise per Session: 60 min  Stress: Stress Concern Present (03/08/2024)   [employer] of Occupational Health - Occupational Stress Questionnaire    Feeling of Stress: Rather much  Social Connections: Unknown (03/08/2024)   Social Connection and Isolation Panel    Frequency of Communication with Friends and Family: Once a  week    Frequency of Social Gatherings with Friends and Family: Not on file    Attends Religious Services: 1 to 4 times per year    Active Member of [employer] or Organizations: No    Attends Banker Meetings: Never    Marital Status: Married  Catering manager Violence: Not At Risk (03/08/2024)   Humiliation, Afraid, Rape, and Kick questionnaire    Fear of Current or Ex-Partner: No    Emotionally Abused:  No    Physically Abused: No    Sexually Abused: No    Review of Systems: ROS negative except for what is noted on the assessment and plan.  Vitals:   03/21/24 0913 03/21/24 0928  BP: (!) 144/86 132/81  Pulse: 61 63  Temp: 97.8 F (36.6 C)   TempSrc: Oral   SpO2: 96%   Weight: 205 lb 10.1 oz (93.3 kg)   Height: 6' (1.829 m)     Physical Exam: Constitutional: sitting in chair, in no acute distress Cardiovascular: regular rate and rhythm, no m/r/g Pulmonary/Chest: normal work of breathing on room air, lungs clear to auscultation bilaterally MSK: no overt scapular deformity, good range of motion though somewhat limited by pain, moderate TTP to medial blade of scapula, no skin changes/swelling, good strength throughout UE, mild spinous process tenderness throughout cervical and thoracic spine Skin: warm and dry   Assessment & Plan:     Patient seen with Dr. Jeanelle  Pain of left scapula Here for follow-up after OV on 7/28.  At that time a trial of Lyrica , tramadol , and resuming previously done PT exercises was attempted.  Unfortunately symptoms have continued and pain is now described as a constant 9/10 though no change in his overall character aside from that.  As explained in 7/28 note, he has extensive spinal pathology as seen on previous MRIs.  He has seen neurosurgery in the past and recommendation at that time was not to pursue surgery.  At this point we have nearly exhausted all outpatient remedies as both oral and topical pain medications have failed to  alleviate symptoms.  Today I will order DG cervical and thoracic spine to further investigate potential compression fracture.  He is also asking for orthopedic referral today for his right knee to inquire about right knee replacement.  Based on results from imaging, may have him reach out to them regarding this issue as well.  Lidocaine  patches sent as well today.  Osteoarthritis of right knee Orthopedic referral sent.  Diabetes (HCC) A1c today is 5.6.  I applauded him for his great work and lowering A1c over the years.  Continue Synjardy  12-998 mg twice daily in addition to continue lifestyle modifications.   Norman Lobstein, D.O. [facility] Internal Medicine, PGY-2 Phone: [phone] Date 03/21/2024 Time 8:07 PM

## 2024-03-21 NOTE — Assessment & Plan Note (Addendum)
 Here for follow-up after OV on 7/28.  At that time a trial of Lyrica , tramadol , and resuming previously done PT exercises was attempted.  Unfortunately symptoms have continued and pain is now described as a constant 9/10 though no change in his overall character aside from that.  As explained in 7/28 note, he has extensive spinal pathology as seen on previous MRIs.  He has seen neurosurgery in the past and recommendation at that time was not to pursue surgery.  At this point we have nearly exhausted all outpatient remedies as both oral and topical pain medications have failed to alleviate symptoms.  Today I will order DG cervical and thoracic spine to further investigate potential compression fracture.  He is also asking for orthopedic referral today for his right knee to inquire about right knee replacement.  Based on results from imaging, may have him reach out to them regarding this issue as well.  Lidocaine  patches sent as well today.

## 2024-03-21 NOTE — Assessment & Plan Note (Signed)
Orthopedic referral sent.

## 2024-03-21 NOTE — Assessment & Plan Note (Signed)
 A1c today is 5.6.  I applauded him for his great work and lowering A1c over the years.  Continue Synjardy  12-998 mg twice daily in addition to continue lifestyle modifications.

## 2024-03-23 NOTE — Progress Notes (Signed)
 Internal Medicine Clinic Attending  I was physically present during the key portions of the resident provided service and participated in the medical decision making of patient's management care. I reviewed pertinent patient test results.  The assessment, diagnosis, and plan were formulated together and I agree with the documentation in the resident's note.  Jeanelle Layman CROME, MD

## 2024-03-27 NOTE — Progress Notes (Signed)
 Internal Medicine Attending:  I reviewed the AWV findings of the medical professional who conducted the visit. I was present in the office suite and immediately available to provide assistance and direction throughout the time the service was provided.

## 2024-04-03 ENCOUNTER — Other Ambulatory Visit: Payer: Self-pay | Admitting: Student

## 2024-04-03 DIAGNOSIS — G8929 Other chronic pain: Secondary | ICD-10-CM

## 2024-04-04 ENCOUNTER — Other Ambulatory Visit: Payer: Self-pay | Admitting: *Deleted

## 2024-04-04 DIAGNOSIS — I10 Essential (primary) hypertension: Secondary | ICD-10-CM

## 2024-04-04 MED ORDER — CARVEDILOL 12.5 MG PO TABS: 12.5000 mg | ORAL_TABLET | Freq: Two times a day (BID) | ORAL | 0 refills | Status: DC

## 2024-04-06 ENCOUNTER — Other Ambulatory Visit (INDEPENDENT_AMBULATORY_CARE_PROVIDER_SITE_OTHER)

## 2024-04-06 ENCOUNTER — Ambulatory Visit (INDEPENDENT_AMBULATORY_CARE_PROVIDER_SITE_OTHER): Admitting: Orthopaedic Surgery

## 2024-04-06 DIAGNOSIS — M1711 Unilateral primary osteoarthritis, right knee: Secondary | ICD-10-CM

## 2024-04-06 NOTE — Progress Notes (Signed)
 Office Visit Note   Patient: Jesus Martin           Date of Birth: 05/17/1961           MRN: [id number] Visit Date: 04/06/2024              Requested by: Jeanelle Layman CROME, MD [address] [place],  [place] [address] PCP: Benuel Braun, DO   Assessment & Plan: Visit Diagnoses:  1. Primary osteoarthritis of right knee     Plan: History of Present Illness Jesus Martin is a 63 year old male who presents with worsening right knee pain. He is accompanied by his spouse. He was referred by his regular doctor to orthopedics after an MRI and ineffective injections.  He has experienced worsening right knee pain over the past year. The pain is sharp, located on the medial aspect near the patella, and radiates down the leg. It interferes with sleep and daily activities, including climbing stairs and walking on inclines. He uses a walking stick at home to prevent falls but avoids using it in public due to aesthetic concerns.  Injections received in the spring provided no relief. An MRI was conducted, leading to his referral to orthopedics.  He has diabetes, well-controlled with an A1c of 5.6. No allergies to nickel and not on blood thinners. He enjoys backpacking and hiking.  Results LABS A1c: 5.6  RADIOLOGY Knee MRI: Grade 4 chondromalacia medial femoral condyle, medial meniscal root tear.  Assessment and Plan Severe right knee osteoarthritis with medial meniscal root tear Chronic severe osteoarthritis with complete cartilage loss in the medial compartment and non-functional meniscus due to root tear. Conservative management failed. Significant functional impairment and severe pain. - Recommend total knee replacement to address arthritis and meniscal damage. - Discussed procedure, recovery expectations, and risks including blood clots, scar tissue, and sensitivity. - Emphasized importance of post-operative physical therapy. - No nickel allergy; diabetes controlled  with A1c 5.6. - Obtain updated right knee x-rays. - Obtain surgical authorization from primary care. - Provide surgical risk questionnaire. - Provide educational handout on knee replacement. - Schedule surgery for winter, coordinate with his preference. - Arrange follow-up with surgery scheduler Marval Scot.  Impression is severe right knee degenerative joint disease secondary to Osteoarthritis.  Patient has attempted conservative treatment for at least 6 consecutive weeks within the past 12 weeks, including but not limited to physical therapy, home exercise program, NSAIDs, activity modification, and/or corticosteroid injections. Despite these efforts, symptoms have not improved or have worsened. Conservative measures have been deemed unsuccessful at this time. After a detailed discussion covering diagnosis and treatment options--including the risks, benefits, alternatives, and potential complications of surgical and nonsurgical management--the patient elected to proceed with surgery  Anticoagulants: No antithrombotic Postop anticoagulation: Eliquis Diabetic: No  Nickel allergy: No Prior DVT/PE: No Tobacco use: No Clearances needed for surgery: PCP Anticipated discharge dispo: Home   Follow-Up Instructions: No follow-ups on file.   Orders:  Orders Placed This Encounter  Procedures   XR KNEE 3 VIEW RIGHT   No orders of the defined types were placed in this encounter.    Subjective: Chief Complaint  Patient presents with   Right Knee - Pain    HPI  Review of Systems  Constitutional: Negative.   HENT: Negative.    Eyes: Negative.   Respiratory: Negative.    Cardiovascular: Negative.   Gastrointestinal: Negative.   Endocrine: Negative.   Genitourinary: Negative.   Skin: Negative.   Allergic/Immunologic:  Negative.   Neurological: Negative.   Hematological: Negative.   Psychiatric/Behavioral: Negative.    All other systems reviewed and are  negative.    Objective: Vital Signs: There were no vitals taken for this visit.  Physical Exam Vitals and nursing note reviewed.  Constitutional:      Appearance: He is well-developed.  HENT:     Head: Normocephalic and atraumatic.  Eyes:     Pupils: Pupils are equal, round, and reactive to light.  Pulmonary:     Effort: Pulmonary effort is normal.  Abdominal:     Palpations: Abdomen is soft.  Musculoskeletal:        General: Normal range of motion.     Cervical back: Neck supple.  Skin:    General: Skin is warm.  Neurological:     Mental Status: He is alert and oriented to person, place, and time.  Psychiatric:        Behavior: Behavior normal.        Thought Content: Thought content normal.        Judgment: Judgment normal.     Ortho Exam  Specialty Comments:  No specialty comments available.  Imaging: XR KNEE 3 VIEW RIGHT Result Date: 04/06/2024 X-rays of the right knee show significant medial compartment joint space narrowing.  Osteophytic spurring of the medial compartment.    PMFS History: Patient Active Problem List   Diagnosis Date Noted   Pain of left scapula 03/07/2024   COPD exacerbation (HCC) 10/04/2023   Osteoarthritis of right knee 10/04/2023   Right knee pain 09/02/2023   Arthritis 05/18/2023   Encounter for screening colonoscopy 12/23/2022   Perforation of tympanic membrane, left 09/28/2022   Need for shingles vaccine 09/08/2022   Otitis media follow-up, not resolved, left 09/01/2022   Otitis externa 09/01/2022   Impetigo 09/01/2022   Urinary frequency 06/10/2022   Memory loss 02/05/2022   Allergic rhinitis 12/09/2021   Cervical spondylosis 02/27/2021   Rash and nonspecific skin eruption 07/10/2020   Hyperthyroidism 04/25/2020   Tobacco use 04/16/2020   Multiple pulmonary nodules 04/16/2020   Compression fracture of spine (HCC) 01/30/2019   Hyperlipidemia 09/26/2018   Foot pain, bilateral 09/26/2018   Diabetic neuropathy (HCC)  12/22/2017   Chronic pain 11/24/2017   Hepatic steatosis 11/24/2017   COPD (chronic obstructive pulmonary disease) (HCC) 11/24/2017   Diabetes (HCC)    Bipolar 1 disorder (HCC) 04/17/2011   Hypertension 04/17/2011   Involuntary weight loss 04/17/2011   Past Medical History:  Diagnosis Date   Bipolar I disorder (HCC) 1991   Dr. Vincente   Borderline hyperlipidemia    history of   COPD (chronic obstructive pulmonary disease) (HCC)    Diabetes mellitus, new onset (HCC) 12/22/2017   Fibromyalgia    GERD (gastroesophageal reflux disease)    HTN (hypertension)    Lumbar vertebral fracture (HCC) 01/29/2019   L1   Tobacco abuse    Viral conjunctivitis of both eyes 08/14/2022   Onset 2 days ago. Eyes are itchy and painful. Pt having tearing of both eyes.    Vitamin D  deficiency     Family History  Adopted: Yes    Past Surgical History:  Procedure Laterality Date   APPENDECTOMY     1975   BACK SURGERY     1998   NOSE SURGERY     Social History   Occupational History   Occupation: disabled  Tobacco Use   Smoking status: Former    Current packs/day: 0.00  Average packs/day: 0.8 packs/day for 45.0 years (33.8 ttl pk-yrs)    Types: Cigarettes    Quit date: 10/01/2023    Years since quitting: 0.5   Smokeless tobacco: Never   Tobacco comments:    3/4 pack smoked daily ARJ 12/09/21    Patient and wife quit smoking 10/01/2023.  Vaping Use   Vaping status: Never Used  Substance and Sexual Activity   Alcohol use: Not Currently    Comment: Beer sometimes.   Drug use: No   Sexual activity: Not on file

## 2024-04-11 ENCOUNTER — Telehealth: Payer: Self-pay | Admitting: Orthopaedic Surgery

## 2024-04-11 ENCOUNTER — Other Ambulatory Visit: Payer: Self-pay

## 2024-04-11 DIAGNOSIS — E7800 Pure hypercholesterolemia, unspecified: Secondary | ICD-10-CM

## 2024-04-11 MED ORDER — ATORVASTATIN CALCIUM 40 MG PO TABS: 40.0000 mg | ORAL_TABLET | Freq: Every day | ORAL | 0 refills | Status: DC

## 2024-04-11 NOTE — Telephone Encounter (Signed)
 Patient called our office today.  He would like to inform Dr. Jerri he has a nickel allergy prior to scheduling the right total knee. He states he has not been to an allergist, but has a reaction in the form of a rash when any metal comes in contact with his skin.

## 2024-04-11 NOTE — Telephone Encounter (Signed)
 Thanks for letting me know.  Please let Josh know about the nickel allergy.

## 2024-04-12 ENCOUNTER — Telehealth: Payer: Self-pay | Admitting: *Deleted

## 2024-04-12 NOTE — Telephone Encounter (Signed)
 Call from pt checking on the status of medical clearance form (total knee replacement) he dropped off on 04/06/24.  Forms are here, but have yet to be completed Pt requesting reason for delay-none given to patient besides waiting on completion by MD CMA asked if surgery has been scheduled? Pt states no, but also states someone cancelled for 9/21 and they might be able to do surgery if paperwork is received in a timely fashion.  CMA discussed with front office staff who will assist with getting clearance completed.  Pt would like a call back once completed..Jesus Burley Cassady9/3/202512:10 PM.

## 2024-04-13 ENCOUNTER — Other Ambulatory Visit: Payer: Self-pay | Admitting: Internal Medicine

## 2024-04-13 ENCOUNTER — Ambulatory Visit: Admitting: Student

## 2024-04-13 DIAGNOSIS — M1711 Unilateral primary osteoarthritis, right knee: Secondary | ICD-10-CM

## 2024-04-13 NOTE — Progress Notes (Signed)
 [facility] Internal Medicine Residency Telephone Encounter Continuity Care Appointment  HPI:  This telephone encounter was created for [name] on 04/13/2024 for the following purpose/cc evaluation prior to R total knee surgery..   Past Medical History:  Past Medical History:  Diagnosis Date   Bipolar I disorder (HCC) 1991   Dr. Vincente   Borderline hyperlipidemia    history of   COPD (chronic obstructive pulmonary disease) (HCC)    Diabetes mellitus, new onset (HCC) 12/22/2017   Fibromyalgia    GERD (gastroesophageal reflux disease)    HTN (hypertension)    Lumbar vertebral fracture (HCC) 01/29/2019   L1   Tobacco abuse    Viral conjunctivitis of both eyes 08/14/2022   Onset 2 days ago. Eyes are itchy and painful. Pt having tearing of both eyes.    Vitamin D  deficiency      ROS:     Assessment / Plan / Recommendations:  Osteoarthritis of right knee Evaluation request by Dr. Ozell Cummins for total knee replacement, R, scheduled for 04/2024.   ID - Patient denies recent illness, fevers, chills, cough, congestion, sick contacts.  Functional capacity - admits to ability to take care of self, such as eat, dress or use the toilet. admits to ability to walk up a flight of steps or a hill. admits to ability to do heavy work around the house such as scrubbing floors or lifting or moving heavy furniture.  Cardiovascular status - Patient denies history of stroke  Patient has a history of mildly uncontrolled hypertension; advised patient to continue monitoring blood pressures at home and adhere to medications.  SBP at home have been <140. Last renal function test in January, within normal limits. Denies chest pain, shortness of breath, palpitations, tachycardia.  Lab Results  Component Value Date   HGBA1C 5.6 03/21/2024   HGBA1C 6.0 (H) 09/02/2023   HGBA1C 5.9 (H) 04/14/2023      Latest Ref Rng & Units 09/02/2023    9:38 AM 03/03/2023   11:06 AM 12/22/2022   10:32 AM  BMP   Glucose 70 - 99 mg/dL 893  885  849   BUN 8 - 27 mg/dL 22  13  22    Creatinine 0.76 - 1.27 mg/dL 8.87  9.15  8.57   BUN/Creat Ratio 10 - 24 20  15  15    Sodium 134 - 144 mmol/L 144  141  142   Potassium 3.5 - 5.2 mmol/L 4.3  3.9  3.7   Chloride 96 - 106 mmol/L 107  104  105   CO2 20 - 29 mmol/L 20  22  19    Calcium  8.6 - 10.2 mg/dL 9.7  89.5  89.8     Anesthesia - Patient denies past history of adverse reaction to anesthesia, denies known family history of reaction to anesthesia for prior back surgery.  Pulmonary function - Patient does not have ongoing tobacco use. The patient currently denies acute pulmonary complaints including, but not limited to shortness of breath, difficulty breathing, cough.  Hematologic status - patient denies prior easy bruising, easy bleeding. No known bleeding disorders. denies have history of anemia. No recent CBC; recommend CBC during anesthesia preop evaluation. He is not currently on anticoagulation.  Patient with hyperthyroidism; last TSH 0.8 in January 2025, on Methimazole     RCIR score of 0; 0.5 % risk of major cardiac event. Discussed with the patient that this discussion is to assess his chronic medical conditions to lessen the risk of complications.  All surgeries have inherent risk despite well controlled medical conditions.   Will fax required paperwork in the next 24 hours.     As always, pt is advised that if symptoms worsen or new symptoms arise, they should go to an urgent care facility or to to ER for further evaluation.   Consent and Medical Decision Making:  Patient discussed with Dr. Trudy This is a telephone encounter between Jesus Martin and Jesus Martin ,MD on 04/13/2024 for R total knee surgery... The visit was conducted with the patient located at home and Jesus Martin ,MD at [facility]. The patient's identity was confirmed using their DOB and current address. The patient has consented to being evaluated through a  telephone encounter and understands the associated risks (an examination cannot be done and the patient may need to come in for an appointment) / benefits (allows the patient to remain at home, decreasing exposure to coronavirus). I personally spent 30 minutes on medical discussion.

## 2024-04-13 NOTE — Assessment & Plan Note (Addendum)
 Evaluation request by Dr. Ozell Cummins for total knee replacement, R, scheduled for 04/2024.   ID - Patient denies recent illness, fevers, chills, cough, congestion, sick contacts.  Functional capacity - admits to ability to take care of self, such as eat, dress or use the toilet. admits to ability to walk up a flight of steps or a hill. admits to ability to do heavy work around the house such as scrubbing floors or lifting or moving heavy furniture.  Cardiovascular status - Patient denies history of stroke  Patient has a history of mildly uncontrolled hypertension; advised patient to continue monitoring blood pressures at home and adhere to medications.  SBP at home have been <140. Last renal function test in January, within normal limits. Denies chest pain, shortness of breath, palpitations, tachycardia.  Lab Results  Component Value Date   HGBA1C 5.6 03/21/2024   HGBA1C 6.0 (H) 09/02/2023   HGBA1C 5.9 (H) 04/14/2023      Latest Ref Rng & Units 09/02/2023    9:38 AM 03/03/2023   11:06 AM 12/22/2022   10:32 AM  BMP  Glucose 70 - 99 mg/dL 893  885  849   BUN 8 - 27 mg/dL 22  13  22    Creatinine 0.76 - 1.27 mg/dL 8.87  9.15  8.57   BUN/Creat Ratio 10 - 24 20  15  15    Sodium 134 - 144 mmol/L 144  141  142   Potassium 3.5 - 5.2 mmol/L 4.3  3.9  3.7   Chloride 96 - 106 mmol/L 107  104  105   CO2 20 - 29 mmol/L 20  22  19    Calcium  8.6 - 10.2 mg/dL 9.7  89.5  89.8     Anesthesia - Patient denies past history of adverse reaction to anesthesia, denies known family history of reaction to anesthesia for prior back surgery.  Pulmonary function - Patient does not have ongoing tobacco use. The patient currently denies acute pulmonary complaints including, but not limited to shortness of breath, difficulty breathing, cough.  Hematologic status - patient denies prior easy bruising, easy bleeding. No known bleeding disorders. denies have history of anemia. No recent CBC; recommend CBC during anesthesia  preop evaluation. He is not currently on anticoagulation.  Patient with hyperthyroidism; last TSH 0.8 in January 2025, on Methimazole     RCIR score of 0; 0.5 % risk of major cardiac event. Discussed with the patient that this discussion is to assess his chronic medical conditions to lessen the risk of complications. All surgeries have inherent risk despite well controlled medical conditions.   Will fax required paperwork in the next 24 hours.

## 2024-04-13 NOTE — Telephone Encounter (Signed)
 Pt given telehealth appt for today with Dr Elnora.Rilei Kravitz Cassady9/4/20251:39 PM

## 2024-04-13 NOTE — Telephone Encounter (Signed)
 Hi Dr. Elnora,  Are you able to complete this for pt. Please advised.

## 2024-04-13 NOTE — Telephone Encounter (Signed)
 Forwarded message to [facility] in regards to f/u with the pt's paperwork that was dropped off.  Copied from CRM #[id number]. Topic: General - Other >> Apr 11, 2024 11:21 AM Carrielelia G wrote: Reason for CRM: Please call Pt Blacketer regarding paperwork that he brought in 04/06/24 [facility] he would like to know the status. Patient would like it as soon as possible so that he can get his procedure.  If you need the paperwork again, he will bring it to you again. >> Apr 12, 2024 11:55 AM Susanna ORN wrote: Patient called in to check if paperwork had been faxed so that he can have his surgery. Called CAL & spke with [employer] and she spoke with patient. Warm transferred.

## 2024-04-14 NOTE — Progress Notes (Signed)
 Internal Medicine Clinic Attending  Case discussed with the resident at the time of the visit.  We reviewed the resident's history and exam and pertinent patient test results.  I agree with the assessment, diagnosis, and plan of care documented in the resident's note.

## 2024-05-01 ENCOUNTER — Other Ambulatory Visit: Payer: Self-pay | Admitting: *Deleted

## 2024-05-01 DIAGNOSIS — J449 Chronic obstructive pulmonary disease, unspecified: Secondary | ICD-10-CM

## 2024-05-01 MED ORDER — TRELEGY ELLIPTA 100-62.5-25 MCG/ACT IN AEPB: INHALATION_SPRAY | RESPIRATORY_TRACT | 3 refills | Status: DC

## 2024-05-02 ENCOUNTER — Other Ambulatory Visit: Payer: Self-pay | Admitting: Physician Assistant

## 2024-05-02 MED ORDER — DOXYCYCLINE HYCLATE 100 MG PO CAPS: 100.0000 mg | ORAL_CAPSULE | Freq: Two times a day (BID) | ORAL | 0 refills | Status: AC

## 2024-05-02 MED ORDER — METHOCARBAMOL 500 MG PO TABS: 500.0000 mg | ORAL_TABLET | Freq: Two times a day (BID) | ORAL | 2 refills | Status: AC | PRN

## 2024-05-02 MED ORDER — OXYCODONE-ACETAMINOPHEN 5-325 MG PO TABS: 1.0000 | ORAL_TABLET | Freq: Three times a day (TID) | ORAL | 0 refills | Status: DC | PRN

## 2024-05-02 MED ORDER — ONDANSETRON HCL 4 MG PO TABS: 4.0000 mg | ORAL_TABLET | Freq: Three times a day (TID) | ORAL | 0 refills | Status: AC | PRN

## 2024-05-02 MED ORDER — DOCUSATE SODIUM 100 MG PO CAPS: 100.0000 mg | ORAL_CAPSULE | Freq: Every day | ORAL | 2 refills | Status: AC | PRN

## 2024-05-05 ENCOUNTER — Other Ambulatory Visit: Payer: Self-pay | Admitting: Student

## 2024-05-05 DIAGNOSIS — G8929 Other chronic pain: Secondary | ICD-10-CM

## 2024-05-08 NOTE — Pre-Procedure Instructions (Signed)
 Surgical Instructions   Your procedure is scheduled on May 15, 2024. Report to [facility] Main Entrance A at 6:00 A.M., then check in with the Admitting office. Any questions or running late day of surgery: call [phone]  Questions prior to your surgery date: call [phone], Monday-Friday, 8am-4pm. If you experience any cold or flu symptoms such as cough, fever, chills, shortness of breath, etc. between now and your scheduled surgery, please notify us  at the above number.     Remember:  Do not eat after midnight the night before your surgery  You may drink clear liquids until 5:30 AM the morning of your surgery.   Clear liquids allowed are: Water, Non-Citrus Juices (without pulp), Carbonated Beverages, Clear Tea (no milk, honey, etc.), Black Coffee Only (NO MILK, CREAM OR POWDERED CREAMER of any kind), and Gatorade.  Patient Instructions  The night before surgery:  No food after midnight. ONLY clear liquids after midnight  The day of surgery (if you have diabetes): Drink ONE (1) 12 oz G2 given to you in your pre admission testing appointment by 5:30 AM the morning of surgery. Drink in one sitting. Do not sip.  This drink was given to you during your hospital  pre-op appointment visit.  Nothing else to drink after completing the  12 oz bottle of G2.         If you have questions, please contact your surgeon's office.    Take these medicines the morning of surgery with A SIP OF WATER: amLODipine  (NORVASC )  atorvastatin  (LIPITOR)  carvedilol  (COREG )  Fluticasone -Umeclidin-Vilant (TRELEGY ELLIPTA )  methimazole  (TAPAZOLE )  omeprazole  (PRILOSEC)  tamsulosin  (FLOMAX )    May take these medicines IF NEEDED: ALPRAZolam  (XANAX )  EPINEPHrine Pen ipratropium-albuterol  (DUONEB) Nebulizer traMADol  (ULTRAM )  VENTOLIN  HFA - please bring inhaler with you morning of surgery   One week prior to surgery, STOP taking any Aspirin (unless otherwise instructed by your surgeon)  Aleve, Naproxen, Ibuprofen, Motrin, Advil, Goody's, BC's, all herbal medications, fish oil, and non-prescription vitamins. This includes your medication: diclofenac  sodium (VOLTAREN ) GEL    WHAT DO I DO ABOUT MY DIABETES MEDICATION?   STOP taking your Empagliflozin-metFORMIN  HCl (SYNJARDY ) three days prior to surgery. Your last dose will be October 2nd.     HOW TO MANAGE YOUR DIABETES BEFORE AND AFTER SURGERY  Why is it important to control my blood sugar before and after surgery? Improving blood sugar levels before and after surgery helps healing and can limit problems. A way of improving blood sugar control is eating a healthy diet by:  Eating less sugar and carbohydrates  Increasing activity/exercise  Talking with your doctor about reaching your blood sugar goals High blood sugars (greater than 180 mg/dL) can raise your risk of infections and slow your recovery, so you will need to focus on controlling your diabetes during the weeks before surgery. Make sure that the doctor who takes care of your diabetes knows about your planned surgery including the date and location.  How do I manage my blood sugar before surgery? Check your blood sugar at least 4 times a day, starting 2 days before surgery, to make sure that the level is not too high or low.  Check your blood sugar the morning of your surgery when you wake up and every 2 hours until you get to the Short Stay unit.  If your blood sugar is less than 70 mg/dL, you will need to treat for low blood sugar: Do not take insulin . Treat a low blood  sugar (less than 70 mg/dL) with  cup of clear juice (cranberry or apple), 4 glucose tablets, OR glucose gel. Recheck blood sugar in 15 minutes after treatment (to make sure it is greater than 70 mg/dL). If your blood sugar is not greater than 70 mg/dL on recheck, call [phone] for further instructions. Report your blood sugar to the short stay nurse when you get to Short Stay.  If you are  admitted to the hospital after surgery: Your blood sugar will be checked by the staff and you will probably be given insulin  after surgery (instead of oral diabetes medicines) to make sure you have good blood sugar levels. The goal for blood sugar control after surgery is 80-180 mg/dL.                      Do NOT Smoke (Tobacco/Vaping) for 24 hours prior to your procedure.  If you use a CPAP at night, you may bring your mask/headgear for your overnight stay.   You will be asked to remove any contacts, glasses, piercing's, hearing aid's, dentures/partials prior to surgery. Please bring cases for these items if needed.    Patients discharged the day of surgery will not be allowed to drive home, and someone needs to stay with them for 24 hours.  SURGICAL WAITING ROOM VISITATION Patients may have no more than 2 support people in the waiting area - these visitors may rotate.   Pre-op nurse will coordinate an appropriate time for 1 ADULT support person, who may not rotate, to accompany patient in pre-op.  Children under the age of 35 must have an adult with them who is not the patient and must remain in the main waiting area with an adult.  If the patient needs to stay at the hospital during part of their recovery, the visitor guidelines for inpatient rooms apply.  Please refer to the [facility] website for the visitor guidelines for any additional information.   If you received a COVID test during your pre-op visit  it is requested that you wear a mask when out in public, stay away from anyone that may not be feeling well and notify your surgeon if you develop symptoms. If you have been in contact with anyone that has tested positive in the last 10 days please notify you surgeon.      Pre-operative 5 CHG Bathing Instructions   You can play a key role in reducing the risk of infection after surgery. Your skin needs to be as free of germs as possible. You can reduce the number of germs on  your skin by washing with CHG (chlorhexidine  gluconate) soap before surgery. CHG is an antiseptic soap that kills germs and continues to kill germs even after washing.   DO NOT use if you have an allergy to chlorhexidine /CHG or antibacterial soaps. If your skin becomes reddened or irritated, stop using the CHG and notify one of our RNs at [phone].   Please shower with the CHG soap starting 4 days before surgery using the following schedule:     Please keep in mind the following:  DO NOT shave, including legs and underarms, starting the day of your first shower.   You may shave your face at any point before/day of surgery.  Place clean sheets on your bed the day you start using CHG soap. Use a clean washcloth (not used since being washed) for each shower. DO NOT sleep with pets once you start using the CHG.  CHG Shower Instructions:  Wash your face and private area with normal soap. If you choose to wash your hair, wash first with your normal shampoo.  After you use shampoo/soap, rinse your hair and body thoroughly to remove shampoo/soap residue.  Turn the water OFF and apply about 3 tablespoons (45 ml) of CHG soap to a CLEAN washcloth.  Apply CHG soap ONLY FROM YOUR NECK DOWN TO YOUR TOES (washing for 3-5 minutes)  DO NOT use CHG soap on face, private areas, open wounds, or sores.  Pay special attention to the area where your surgery is being performed.  If you are having back surgery, having someone wash your back for you may be helpful. Wait 2 minutes after CHG soap is applied, then you may rinse off the CHG soap.  Pat dry with a clean towel  Put on clean clothes/pajamas   If you choose to wear lotion, please use ONLY the CHG-compatible lotions that are listed below.  Additional instructions for the day of surgery: DO NOT APPLY any lotions, deodorants, cologne, or perfumes.   Do not bring valuables to the hospital. [facility] is not responsible for any belongings/valuables. Do  not wear nail polish, gel polish, artificial nails, or any other type of covering on natural nails (fingers and toes) Do not wear jewelry or makeup Put on clean/comfortable clothes.  Please brush your teeth.  Ask your nurse before applying any prescription medications to the skin.     CHG Compatible Lotions   Aveeno Moisturizing lotion  Cetaphil Moisturizing Cream  Cetaphil Moisturizing Lotion  Clairol Herbal Essence Moisturizing Lotion, Dry Skin  Clairol Herbal Essence Moisturizing Lotion, Extra Dry Skin  Clairol Herbal Essence Moisturizing Lotion, Normal Skin  Curel Age Defying Therapeutic Moisturizing Lotion with Alpha Hydroxy  Curel Extreme Care Body Lotion  Curel Soothing Hands Moisturizing Hand Lotion  Curel Therapeutic Moisturizing Cream, Fragrance-Free  Curel Therapeutic Moisturizing Lotion, Fragrance-Free  Curel Therapeutic Moisturizing Lotion, Original Formula  Eucerin Daily Replenishing Lotion  Eucerin Dry Skin Therapy Plus Alpha Hydroxy Crme  Eucerin Dry Skin Therapy Plus Alpha Hydroxy Lotion  Eucerin Original Crme  Eucerin Original Lotion  Eucerin Plus Crme Eucerin Plus Lotion  Eucerin TriLipid Replenishing Lotion  Keri Anti-Bacterial Hand Lotion  Keri Deep Conditioning Original Lotion Dry Skin Formula Softly Scented  Keri Deep Conditioning Original Lotion, Fragrance Free Sensitive Skin Formula  Keri Lotion Fast Absorbing Fragrance Free Sensitive Skin Formula  Keri Lotion Fast Absorbing Softly Scented Dry Skin Formula  Keri Original Lotion  Keri Skin Renewal Lotion Keri Silky Smooth Lotion  Keri Silky Smooth Sensitive Skin Lotion  Nivea Body Creamy Conditioning Oil  Nivea Body Extra Enriched Lotion  Nivea Body Original Lotion  Nivea Body Sheer Moisturizing Lotion Nivea Crme  Nivea Skin Firming Lotion  NutraDerm 30 Skin Lotion  NutraDerm Skin Lotion  NutraDerm Therapeutic Skin Cream  NutraDerm Therapeutic Skin Lotion  ProShield Protective Hand Cream   Provon moisturizing lotion  Please read over the following fact sheets that you were given.

## 2024-05-09 ENCOUNTER — Encounter (HOSPITAL_COMMUNITY)
Admission: RE | Admit: 2024-05-09 | Discharge: 2024-05-09 | Disposition: A | Discharge status: Home / Self Care | Source: Ambulatory Visit | Attending: Orthopaedic Surgery | Admitting: Orthopaedic Surgery

## 2024-05-09 ENCOUNTER — Other Ambulatory Visit: Payer: Self-pay | Admitting: Student

## 2024-05-09 ENCOUNTER — Encounter (HOSPITAL_COMMUNITY): Payer: Self-pay

## 2024-05-09 ENCOUNTER — Other Ambulatory Visit: Payer: Self-pay

## 2024-05-09 VITALS — BP 118/75 | HR 76 | Temp 98.0°F | Resp 16 | Ht 72.0 in | Wt 199.6 lb

## 2024-05-09 DIAGNOSIS — M1711 Unilateral primary osteoarthritis, right knee: Secondary | ICD-10-CM | POA: Diagnosis not present

## 2024-05-09 DIAGNOSIS — G8929 Other chronic pain: Secondary | ICD-10-CM

## 2024-05-09 DIAGNOSIS — Z01818 Encounter for other preprocedural examination: Secondary | ICD-10-CM | POA: Diagnosis present

## 2024-05-09 DIAGNOSIS — E119 Type 2 diabetes mellitus without complications: Secondary | ICD-10-CM | POA: Insufficient documentation

## 2024-05-09 HISTORY — DX: Thyrotoxicosis, unspecified without thyrotoxic crisis or storm: E05.90

## 2024-05-09 HISTORY — DX: Unspecified asthma, uncomplicated: J45.909

## 2024-05-09 HISTORY — DX: Anxiety disorder, unspecified: F41.9

## 2024-05-09 HISTORY — DX: Pneumonia, unspecified organism: J18.9

## 2024-05-09 HISTORY — DX: Personal history of urinary calculi: Z87.442

## 2024-05-09 HISTORY — DX: Unspecified osteoarthritis, unspecified site: M19.90

## 2024-05-09 LAB — BASIC METABOLIC PANEL WITH GFR
Anion gap: 8 (ref 5–15)
BUN: 15 mg/dL (ref 8–23)
CO2: 25 mmol/L (ref 22–32)
Calcium: 9.6 mg/dL (ref 8.9–10.3)
Chloride: 103 mmol/L (ref 98–111)
Creatinine, Ser: 1.18 mg/dL (ref 0.61–1.24)
GFR, Estimated: >60 mL/min (ref >60)
Glucose, Bld: 97 mg/dL (ref 70–99)
Potassium: 4.2 mmol/L (ref 3.5–5.1)
Sodium: 136 mmol/L (ref 135–145)

## 2024-05-09 LAB — SURGICAL PCR SCREEN
MRSA, PCR: NEGATIVE
Staphylococcus aureus: NEGATIVE

## 2024-05-09 LAB — CBC
HCT: 40.9 % (ref 39.0–52.0)
Hemoglobin: 13.8 g/dL (ref 13.0–17.0)
MCH: 32.9 pg (ref 26.0–34.0)
MCHC: 33.7 g/dL (ref 30.0–36.0)
MCV: 97.6 fL (ref 80.0–100.0)
Platelets: 206 K/uL (ref 150–400)
RBC: 4.19 MIL/uL — ABNORMAL LOW (ref 4.22–5.81)
RDW: 11 % — ABNORMAL LOW (ref 11.5–15.5)
WBC: 6.9 K/uL (ref 4.0–10.5)
nRBC: 0 % (ref 0.0–0.2)

## 2024-05-09 LAB — GLUCOSE, CAPILLARY: Glucose-Capillary: 103 mg/dL — ABNORMAL HIGH (ref 70–99)

## 2024-05-09 NOTE — Progress Notes (Addendum)
 PCP - [facility] Internal Medicine. Pt see's whomever is available Cardiologist - Denies  PPM/ICD - Denies Device Orders - n/a Rep Notified - n/a  Chest x-ray - n/a - CT Lung completed 10/29/2023 EKG - 05/09/2024 Stress Test - Denies ECHO - 12/13/2019 Cardiac Cath - Denies  Sleep Study - Denies CPAP - n/a  Pt is DM2. He has a meter at home, but does not check his blood sugar, therefore does not know his normal fasting range. CBGF at pre-op 103. Last A1c 5.6 on 03/21/2024  Last dose of GLP1 agonist- n/a GLP1 instructions: n/a SGLT-2 Instructions - Pt instructed to hold SYNJARDY  three days prior to surgery. Last dose will be 10/2  Blood Thinner Instructions: n/a Aspirin Instructions: n/a  ERAS Protcol - Clear liquids until 0530 morning of surgery PRE-SURGERY Ensure or G2- G2 given to pt with instructions  COVID TEST- n/a   Anesthesia review: Yes. Borderline EKG review. Hx of HTN, COPD, DM. Echo in 2021 showed Grade 1 Diastolic Dysfunction.   Patient denies shortness of breath, fever, cough and chest pain at PAT appointment. Pt denies any respiratory illness/infection in the last two months.   All instructions explained to the patient, with a verbal understanding of the material. Patient agrees to go over the instructions while at home for a better understanding. Patient also instructed to self quarantine after being tested for COVID-19. The opportunity to ask questions was provided.

## 2024-05-10 ENCOUNTER — Other Ambulatory Visit: Payer: Self-pay

## 2024-05-10 DIAGNOSIS — K219 Gastro-esophageal reflux disease without esophagitis: Secondary | ICD-10-CM

## 2024-05-10 MED ORDER — OMEPRAZOLE 40 MG PO CPDR: 40.0000 mg | DELAYED_RELEASE_CAPSULE | Freq: Every day | ORAL | 0 refills | Status: DC

## 2024-05-10 NOTE — Telephone Encounter (Signed)
 Medication sent to pharmacy

## 2024-05-12 MED ORDER — TRANEXAMIC ACID 1000 MG/10ML IV SOLN
2000.0000 mg | INTRAVENOUS | Status: DC
  Filled 2024-05-12: qty 20

## 2024-05-15 ENCOUNTER — Ambulatory Visit (HOSPITAL_COMMUNITY): Payer: Self-pay | Admitting: Physician Assistant

## 2024-05-15 ENCOUNTER — Observation Stay (HOSPITAL_COMMUNITY)

## 2024-05-15 ENCOUNTER — Encounter (HOSPITAL_COMMUNITY): Payer: Self-pay | Admitting: Orthopaedic Surgery

## 2024-05-15 ENCOUNTER — Ambulatory Visit (HOSPITAL_COMMUNITY)

## 2024-05-15 ENCOUNTER — Other Ambulatory Visit (HOSPITAL_COMMUNITY): Payer: Self-pay

## 2024-05-15 ENCOUNTER — Other Ambulatory Visit: Payer: Self-pay

## 2024-05-15 ENCOUNTER — Encounter (HOSPITAL_COMMUNITY)
Admission: RE | Disposition: A | Discharge status: Home / Self Care | Payer: Self-pay | Source: Home / Self Care | Attending: Orthopaedic Surgery

## 2024-05-15 ENCOUNTER — Other Ambulatory Visit: Payer: Self-pay | Admitting: Physician Assistant

## 2024-05-15 ENCOUNTER — Observation Stay (HOSPITAL_COMMUNITY)
Admission: RE | Admit: 2024-05-15 | Discharge: 2024-05-16 | Disposition: A | Discharge status: Home / Self Care | Attending: Orthopaedic Surgery | Admitting: Orthopaedic Surgery

## 2024-05-15 DIAGNOSIS — E119 Type 2 diabetes mellitus without complications: Secondary | ICD-10-CM | POA: Insufficient documentation

## 2024-05-15 DIAGNOSIS — M2419 Other articular cartilage disorders, other specified site: Secondary | ICD-10-CM | POA: Diagnosis not present

## 2024-05-15 DIAGNOSIS — Z7901 Long term (current) use of anticoagulants: Secondary | ICD-10-CM | POA: Diagnosis not present

## 2024-05-15 DIAGNOSIS — Z96651 Presence of right artificial knee joint: Secondary | ICD-10-CM

## 2024-05-15 DIAGNOSIS — Z79899 Other long term (current) drug therapy: Secondary | ICD-10-CM | POA: Insufficient documentation

## 2024-05-15 DIAGNOSIS — J449 Chronic obstructive pulmonary disease, unspecified: Secondary | ICD-10-CM | POA: Diagnosis not present

## 2024-05-15 DIAGNOSIS — I10 Essential (primary) hypertension: Secondary | ICD-10-CM | POA: Diagnosis not present

## 2024-05-15 DIAGNOSIS — M1711 Unilateral primary osteoarthritis, right knee: Secondary | ICD-10-CM

## 2024-05-15 DIAGNOSIS — Z87891 Personal history of nicotine dependence: Secondary | ICD-10-CM

## 2024-05-15 DIAGNOSIS — J4489 Other specified chronic obstructive pulmonary disease: Secondary | ICD-10-CM | POA: Diagnosis not present

## 2024-05-15 HISTORY — PX: TOTAL KNEE ARTHROPLASTY: SHX125

## 2024-05-15 LAB — GLUCOSE, CAPILLARY
Glucose-Capillary: 127 mg/dL — ABNORMAL HIGH (ref 70–99)
Glucose-Capillary: 119 mg/dL — ABNORMAL HIGH (ref 70–99)
Glucose-Capillary: 119 mg/dL — ABNORMAL HIGH (ref 70–99)
Glucose-Capillary: 148 mg/dL — ABNORMAL HIGH (ref 70–99)
Glucose-Capillary: 103 mg/dL — ABNORMAL HIGH (ref 70–99)

## 2024-05-15 SURGERY — ARTHROPLASTY, KNEE, TOTAL
Anesthesia: Spinal | Site: Knee | Laterality: Right

## 2024-05-15 MED ORDER — PROPOFOL 10 MG/ML IV BOLUS
INTRAVENOUS | Status: DC | PRN
  Administered 2024-05-15: 35 mg via INTRAVENOUS
  Administered 2024-05-15: 30 mg via INTRAVENOUS
  Administered 2024-05-15: 20 mg via INTRAVENOUS
  Administered 2024-05-15: 15 mg via INTRAVENOUS

## 2024-05-15 MED ORDER — CARVEDILOL 12.5 MG PO TABS
12.5000 mg | ORAL_TABLET | Freq: Two times a day (BID) | ORAL | Status: DC
  Administered 2024-05-15 – 2024-05-16 (×2): 12.5 mg via ORAL
  Filled 2024-05-15 (×2): qty 1

## 2024-05-15 MED ORDER — TRANEXAMIC ACID-NACL 1000-0.7 MG/100ML-% IV SOLN
1000.0000 mg | INTRAVENOUS | Status: AC
  Administered 2024-05-15: 1000 mg via INTRAVENOUS
  Filled 2024-05-15: qty 100

## 2024-05-15 MED ORDER — ONDANSETRON HCL 4 MG/2ML IJ SOLN
INTRAMUSCULAR | Status: AC
Start: 2024-05-15 — End: 2024-05-15
  Filled 2024-05-15: qty 2

## 2024-05-15 MED ORDER — LACTATED RINGERS IV SOLN: INTRAVENOUS | Status: DC

## 2024-05-15 MED ORDER — HYDROCHLOROTHIAZIDE 25 MG PO TABS
25.0000 mg | ORAL_TABLET | Freq: Every day | ORAL | Status: DC
  Administered 2024-05-15 – 2024-05-16 (×2): 25 mg via ORAL
  Filled 2024-05-15 (×2): qty 1

## 2024-05-15 MED ORDER — APIXABAN 2.5 MG PO TABS
2.5000 mg | ORAL_TABLET | Freq: Two times a day (BID) | ORAL | Status: DC
  Administered 2024-05-16: 2.5 mg via ORAL
  Filled 2024-05-15: qty 1

## 2024-05-15 MED ORDER — VANCOMYCIN HCL 1000 MG IV SOLR
INTRAVENOUS | Status: AC
  Filled 2024-05-15: qty 20

## 2024-05-15 MED ORDER — KETOROLAC TROMETHAMINE 15 MG/ML IJ SOLN
7.5000 mg | Freq: Four times a day (QID) | INTRAMUSCULAR | Status: AC
Start: 2024-05-15 — End: 2024-05-16
  Administered 2024-05-15 – 2024-05-16 (×4): 7.5 mg via INTRAVENOUS
  Filled 2024-05-15 (×4): qty 1

## 2024-05-15 MED ORDER — METHOCARBAMOL 1000 MG/10ML IJ SOLN: 500.0000 mg | Freq: Four times a day (QID) | INTRAMUSCULAR | Status: DC | PRN

## 2024-05-15 MED ORDER — DEXAMETHASONE SODIUM PHOSPHATE 10 MG/ML IJ SOLN
10.0000 mg | Freq: Once | INTRAMUSCULAR | Status: AC
  Administered 2024-05-16: 10 mg via INTRAVENOUS
  Filled 2024-05-15: qty 1

## 2024-05-15 MED ORDER — INSULIN ASPART 100 UNIT/ML IJ SOLN: 0.0000 [IU] | Freq: Three times a day (TID) | INTRAMUSCULAR | Status: DC

## 2024-05-15 MED ORDER — EPHEDRINE SULFATE-NACL 50-0.9 MG/10ML-% IV SOSY
PREFILLED_SYRINGE | INTRAVENOUS | Status: DC | PRN
  Administered 2024-05-15: 7.5 mg via INTRAVENOUS

## 2024-05-15 MED ORDER — ONDANSETRON HCL 4 MG/2ML IJ SOLN: 4.0000 mg | Freq: Four times a day (QID) | INTRAMUSCULAR | Status: DC | PRN

## 2024-05-15 MED ORDER — AMLODIPINE BESYLATE 10 MG PO TABS
10.0000 mg | ORAL_TABLET | Freq: Every day | ORAL | Status: DC
  Filled 2024-05-15 (×2): qty 1

## 2024-05-15 MED ORDER — TAMSULOSIN HCL 0.4 MG PO CAPS
0.4000 mg | ORAL_CAPSULE | Freq: Every day | ORAL | Status: DC
  Administered 2024-05-15: 0.4 mg via ORAL
  Filled 2024-05-15: qty 1

## 2024-05-15 MED ORDER — INSULIN ASPART 100 UNIT/ML IJ SOLN: 0.0000 [IU] | INTRAMUSCULAR | Status: DC | PRN

## 2024-05-15 MED ORDER — POVIDONE-IODINE 10 % EX SWAB
2.0000 | Freq: Once | CUTANEOUS | Status: AC
Start: 2024-05-15 — End: 2024-05-15
  Administered 2024-05-15: 2 via TOPICAL

## 2024-05-15 MED ORDER — MENTHOL 3 MG MT LOZG: 1.0000 | LOZENGE | OROMUCOSAL | Status: DC | PRN

## 2024-05-15 MED ORDER — ONDANSETRON HCL 4 MG/2ML IJ SOLN
INTRAMUSCULAR | Status: DC | PRN
  Administered 2024-05-15: 4 mg via INTRAVENOUS

## 2024-05-15 MED ORDER — SODIUM CHLORIDE 0.9 % IV SOLN
12.5000 mg | INTRAVENOUS | Status: DC | PRN
  Filled 2024-05-15: qty 0.5

## 2024-05-15 MED ORDER — 0.9 % SODIUM CHLORIDE (POUR BTL) OPTIME
TOPICAL | Status: DC | PRN
  Administered 2024-05-15: 1000 mL

## 2024-05-15 MED ORDER — ISOPROPYL ALCOHOL 70 % SOLN
Status: DC | PRN
  Administered 2024-05-15: 1 via TOPICAL

## 2024-05-15 MED ORDER — FENTANYL CITRATE (PF) 100 MCG/2ML IJ SOLN
INTRAMUSCULAR | Status: AC
  Filled 2024-05-15: qty 2

## 2024-05-15 MED ORDER — SODIUM CHLORIDE 0.9 % IR SOLN
Status: DC | PRN
  Administered 2024-05-15: 1000 mL

## 2024-05-15 MED ORDER — APIXABAN 2.5 MG PO TABS
2.5000 mg | ORAL_TABLET | Freq: Two times a day (BID) | ORAL | 0 refills | Status: AC
  Filled 2024-05-15: qty 60, 30d supply, fill #0

## 2024-05-15 MED ORDER — BUPIVACAINE-MELOXICAM ER 400-12 MG/14ML IJ SOLN
INTRAMUSCULAR | Status: DC | PRN
  Administered 2024-05-15: 400 mg

## 2024-05-15 MED ORDER — DOXYCYCLINE HYCLATE 100 MG PO TABS
100.0000 mg | ORAL_TABLET | Freq: Two times a day (BID) | ORAL | Status: DC
  Administered 2024-05-16: 100 mg via ORAL
  Filled 2024-05-15: qty 1

## 2024-05-15 MED ORDER — ACETAMINOPHEN 500 MG PO TABS
1000.0000 mg | ORAL_TABLET | Freq: Four times a day (QID) | ORAL | Status: AC
  Administered 2024-05-15 – 2024-05-16 (×4): 1000 mg via ORAL
  Filled 2024-05-15 (×4): qty 2

## 2024-05-15 MED ORDER — LAMOTRIGINE 150 MG PO TABS
400.0000 mg | ORAL_TABLET | Freq: Every day | ORAL | Status: DC
  Administered 2024-05-15: 400 mg via ORAL
  Filled 2024-05-15: qty 1

## 2024-05-15 MED ORDER — CHLORHEXIDINE GLUCONATE 0.12 % MT SOLN
15.0000 mL | Freq: Once | OROMUCOSAL | Status: AC
  Administered 2024-05-15: 15 mL via OROMUCOSAL
  Filled 2024-05-15: qty 15

## 2024-05-15 MED ORDER — METOCLOPRAMIDE HCL 5 MG/ML IJ SOLN: 5.0000 mg | Freq: Three times a day (TID) | INTRAMUSCULAR | Status: DC | PRN

## 2024-05-15 MED ORDER — HYDROMORPHONE HCL 1 MG/ML IJ SOLN: 0.2500 mg | INTRAMUSCULAR | Status: DC | PRN

## 2024-05-15 MED ORDER — PROPOFOL 500 MG/50ML IV EMUL
INTRAVENOUS | Status: DC | PRN
  Administered 2024-05-15: 100 ug/kg/min via INTRAVENOUS

## 2024-05-15 MED ORDER — OXYCODONE HCL 5 MG/5ML PO SOLN: 5.0000 mg | Freq: Once | ORAL | Status: DC | PRN

## 2024-05-15 MED ORDER — METOCLOPRAMIDE HCL 5 MG PO TABS: 5.0000 mg | ORAL_TABLET | Freq: Three times a day (TID) | ORAL | Status: DC | PRN

## 2024-05-15 MED ORDER — PHENOL 1.4 % MT LIQD: 1.0000 | OROMUCOSAL | Status: DC | PRN

## 2024-05-15 MED ORDER — TRANEXAMIC ACID 1000 MG/10ML IV SOLN
INTRAVENOUS | Status: DC | PRN
  Administered 2024-05-15: 2000 mg via TOPICAL

## 2024-05-15 MED ORDER — TRANEXAMIC ACID-NACL 1000-0.7 MG/100ML-% IV SOLN
1000.0000 mg | Freq: Once | INTRAVENOUS | Status: AC
  Administered 2024-05-15: 1000 mg via INTRAVENOUS
  Filled 2024-05-15: qty 100

## 2024-05-15 MED ORDER — ACETAMINOPHEN 325 MG PO TABS: 325.0000 mg | ORAL_TABLET | Freq: Four times a day (QID) | ORAL | Status: DC | PRN

## 2024-05-15 MED ORDER — DEXAMETHASONE SODIUM PHOSPHATE 10 MG/ML IJ SOLN
INTRAMUSCULAR | Status: AC
  Filled 2024-05-15: qty 1

## 2024-05-15 MED ORDER — LOSARTAN POTASSIUM-HCTZ 100-25 MG PO TABS
1.0000 | ORAL_TABLET | Freq: Every day | ORAL | Status: DC
Start: 2024-05-15 — End: 2024-05-15

## 2024-05-15 MED ORDER — MIDAZOLAM HCL 2 MG/2ML IJ SOLN
INTRAMUSCULAR | Status: AC
  Filled 2024-05-15: qty 2

## 2024-05-15 MED ORDER — PHENYLEPHRINE HCL-NACL 20-0.9 MG/250ML-% IV SOLN
INTRAVENOUS | Status: DC | PRN
  Administered 2024-05-15: 15 ug/min via INTRAVENOUS

## 2024-05-15 MED ORDER — OXYCODONE HCL 5 MG PO TABS: 5.0000 mg | ORAL_TABLET | Freq: Once | ORAL | Status: DC | PRN

## 2024-05-15 MED ORDER — AMISULPRIDE (ANTIEMETIC) 5 MG/2ML IV SOLN: 10.0000 mg | Freq: Once | INTRAVENOUS | Status: DC | PRN

## 2024-05-15 MED ORDER — SODIUM CHLORIDE 0.9 % IV SOLN: INTRAVENOUS | Status: DC

## 2024-05-15 MED ORDER — VANCOMYCIN HCL 1000 MG IV SOLR
INTRAVENOUS | Status: DC | PRN
  Administered 2024-05-15: 1000 mg via TOPICAL

## 2024-05-15 MED ORDER — OXYCODONE HCL 5 MG PO TABS
5.0000 mg | ORAL_TABLET | Freq: Four times a day (QID) | ORAL | Status: DC | PRN
  Administered 2024-05-15: 5 mg via ORAL
  Filled 2024-05-15: qty 1

## 2024-05-15 MED ORDER — PRONTOSAN WOUND IRRIGATION OPTIME
TOPICAL | Status: DC | PRN
  Administered 2024-05-15: 1 via TOPICAL

## 2024-05-15 MED ORDER — BUPIVACAINE-MELOXICAM ER 400-12 MG/14ML IJ SOLN
INTRAMUSCULAR | Status: AC
  Filled 2024-05-15: qty 1

## 2024-05-15 MED ORDER — OXYCODONE HCL 5 MG PO TABS
10.0000 mg | ORAL_TABLET | Freq: Four times a day (QID) | ORAL | Status: DC | PRN
  Administered 2024-05-16 (×3): 10 mg via ORAL
  Filled 2024-05-15 (×3): qty 2

## 2024-05-15 MED ORDER — CARVEDILOL 12.5 MG PO TABS
12.5000 mg | ORAL_TABLET | Freq: Once | ORAL | Status: AC
  Administered 2024-05-15: 12.5 mg via ORAL
  Filled 2024-05-15: qty 1

## 2024-05-15 MED ORDER — ORAL CARE MOUTH RINSE: 15.0000 mL | Freq: Once | OROMUCOSAL | Status: AC

## 2024-05-15 MED ORDER — HYDROMORPHONE HCL 1 MG/ML IJ SOLN: 1.0000 mg | Freq: Three times a day (TID) | INTRAMUSCULAR | Status: DC | PRN

## 2024-05-15 MED ORDER — CEFAZOLIN SODIUM-DEXTROSE 2-4 GM/100ML-% IV SOLN
2.0000 g | INTRAVENOUS | Status: AC
  Administered 2024-05-15: 2 g via INTRAVENOUS
  Filled 2024-05-15: qty 100

## 2024-05-15 MED ORDER — DEXAMETHASONE SODIUM PHOSPHATE 10 MG/ML IJ SOLN
INTRAMUSCULAR | Status: DC | PRN
  Administered 2024-05-15: 5 mg via INTRAVENOUS

## 2024-05-15 MED ORDER — LOSARTAN POTASSIUM 50 MG PO TABS
100.0000 mg | ORAL_TABLET | Freq: Every day | ORAL | Status: DC
  Administered 2024-05-15 – 2024-05-16 (×2): 100 mg via ORAL
  Filled 2024-05-15 (×2): qty 2

## 2024-05-15 MED ORDER — ONDANSETRON HCL 4 MG PO TABS: 4.0000 mg | ORAL_TABLET | Freq: Four times a day (QID) | ORAL | Status: DC | PRN

## 2024-05-15 MED ORDER — BUPIVACAINE IN DEXTROSE 0.75-8.25 % IT SOLN
INTRATHECAL | Status: DC | PRN
  Administered 2024-05-15: 2 mL via INTRATHECAL

## 2024-05-15 MED ORDER — MIDAZOLAM HCL 2 MG/2ML IJ SOLN
INTRAMUSCULAR | Status: DC | PRN
  Administered 2024-05-15: 2 mg via INTRAVENOUS

## 2024-05-15 MED ORDER — INSULIN ASPART 100 UNIT/ML IJ SOLN: 0.0000 [IU] | Freq: Every day | INTRAMUSCULAR | Status: DC

## 2024-05-15 MED ORDER — CEFAZOLIN SODIUM-DEXTROSE 2-4 GM/100ML-% IV SOLN
2.0000 g | Freq: Four times a day (QID) | INTRAVENOUS | Status: AC
  Administered 2024-05-15 (×2): 2 g via INTRAVENOUS
  Filled 2024-05-15 (×2): qty 100

## 2024-05-15 MED ORDER — METHOCARBAMOL 500 MG PO TABS
500.0000 mg | ORAL_TABLET | Freq: Four times a day (QID) | ORAL | Status: DC | PRN
  Administered 2024-05-15 – 2024-05-16 (×2): 500 mg via ORAL
  Filled 2024-05-15 (×2): qty 1

## 2024-05-15 MED ORDER — DOCUSATE SODIUM 100 MG PO CAPS
100.0000 mg | ORAL_CAPSULE | Freq: Two times a day (BID) | ORAL | Status: DC
  Administered 2024-05-15 – 2024-05-16 (×3): 100 mg via ORAL
  Filled 2024-05-15 (×3): qty 1

## 2024-05-15 MED ORDER — ROPIVACAINE HCL 5 MG/ML IJ SOLN
INTRAMUSCULAR | Status: DC | PRN
  Administered 2024-05-15: 20 mL via PERINEURAL

## 2024-05-15 SURGICAL SUPPLY — 67 items
ALCOHOL 70% 16 OZ (MISCELLANEOUS) ×2 IMPLANT
BAG COUNTER SPONGE SURGICOUNT (BAG) ×2 IMPLANT
BAG DECANTER FOR FLEXI CONT (MISCELLANEOUS) ×2 IMPLANT
BANDAGE ESMARK 6X9 LF (GAUZE/BANDAGES/DRESSINGS) IMPLANT
BLADE SAG 18X100X1.27 (BLADE) ×2 IMPLANT
BLADE SAW SAG 90X13X1.27 (BLADE) ×2 IMPLANT
BLADE SAW SGTL 73X25 THK (BLADE) ×2 IMPLANT
BOWL SMART MIX CTS (DISPOSABLE) IMPLANT
CLSR STERI-STRIP ANTIMIC 1/2X4 (GAUZE/BANDAGES/DRESSINGS) IMPLANT
COMPONENT FEM PS STD 10 RT (Joint) IMPLANT
COMPONENT PATELLA PEG 3 32 (Joint) IMPLANT
COMPONET TIB PS KNEE 0D E RT (Joint) IMPLANT
COOLER ICEMAN CLASSIC (MISCELLANEOUS) ×2 IMPLANT
COVER SURGICAL LIGHT HANDLE (MISCELLANEOUS) ×2 IMPLANT
CUFF TOURN SGL QUICK 42 (TOURNIQUET CUFF) IMPLANT
CUFF TRNQT CYL 34X4.125X (TOURNIQUET CUFF) ×2 IMPLANT
DERMABOND ADVANCED .7 DNX12 (GAUZE/BANDAGES/DRESSINGS) ×2 IMPLANT
DRAPE EXTREMITY T 121X128X90 (DISPOSABLE) ×2 IMPLANT
DRAPE HALF SHEET 40X57 (DRAPES) ×2 IMPLANT
DRAPE INCISE IOBAN 66X45 STRL (DRAPES) ×2 IMPLANT
DRAPE POUCH INSTRU U-SHP 10X18 (DRAPES) ×2 IMPLANT
DRAPE SURG ORHT 6 SPLT 77X108 (DRAPES) IMPLANT
DRAPE U-SHAPE 47X51 STRL (DRAPES) ×4 IMPLANT
DRSG AQUACEL AG ADV 3.5X10 (GAUZE/BANDAGES/DRESSINGS) ×2 IMPLANT
DURAPREP 26ML APPLICATOR (WOUND CARE) ×6 IMPLANT
ELECT CAUTERY BLADE 6.4 (BLADE) ×2 IMPLANT
ELECT PENCIL ROCKER SW 15FT (MISCELLANEOUS) ×2 IMPLANT
ELECTRODE REM PT RTRN 9FT ADLT (ELECTROSURGICAL) ×2 IMPLANT
GLOVE BIOGEL PI IND STRL 7.5 (GLOVE) ×2 IMPLANT
GLOVE ECLIPSE 7.0 STRL STRAW (GLOVE) ×10 IMPLANT
GLOVE INDICATOR 7.0 STRL GRN (GLOVE) ×2 IMPLANT
GLOVE SURG SYN 7.5 PF PI (GLOVE) ×10 IMPLANT
GOWN STRL REUS W/ TWL LRG LVL3 (GOWN DISPOSABLE) ×4 IMPLANT
GOWN TOGA ZIPPER T7+ PEEL AWAY (MISCELLANEOUS) ×2 IMPLANT
HOOD PEEL AWAY T7 (MISCELLANEOUS) ×2 IMPLANT
INSERT TIBIA KNEE RIGHT 10 (Joint) IMPLANT
IV 0.9% NACL 1000 ML (IV SOLUTION) ×2 IMPLANT
KIT BASIN OR (CUSTOM PROCEDURE TRAY) ×2 IMPLANT
KIT TURNOVER KIT B (KITS) ×2 IMPLANT
MANIFOLD NEPTUNE II (INSTRUMENTS) ×2 IMPLANT
MARKER SKIN DUAL TIP RULER LAB (MISCELLANEOUS) ×4 IMPLANT
NDL SPNL 18GX3.5 QUINCKE PK (NEEDLE) ×2 IMPLANT
NEEDLE SPNL 18GX3.5 QUINCKE PK (NEEDLE) ×1 IMPLANT
PACK TOTAL JOINT (CUSTOM PROCEDURE TRAY) ×2 IMPLANT
PAD ARMBOARD POSITIONER FOAM (MISCELLANEOUS) ×4 IMPLANT
PAD COLD SHLDR WRAP-ON (PAD) ×2 IMPLANT
PIN DRILL HDLS TROCAR 75 4PK (PIN) IMPLANT
SCREW FEMALE HEX FIX 25X2.5 (ORTHOPEDIC DISPOSABLE SUPPLIES) IMPLANT
SET HNDPC FAN SPRY TIP SCT (DISPOSABLE) ×2 IMPLANT
SOLN 0.9% NACL 1000 ML (IV SOLUTION) ×1 IMPLANT
SOLN 0.9% NACL POUR BTL 1000ML (IV SOLUTION) ×2 IMPLANT
SOLUTION PRONTOSAN WOUND 350ML (IRRIGATION / IRRIGATOR) ×2 IMPLANT
STAPLER SKIN PROX 35W (STAPLE) IMPLANT
SUCTION TUBE FRAZIER 10FR DISP (SUCTIONS) IMPLANT
SUT ETHILON 2 0 FS 18 (SUTURE) ×4 IMPLANT
SUT STRATAFIX PDS+ 0 24IN (SUTURE) ×2 IMPLANT
SUT VIC AB 0 CT1 27XBRD ANBCTR (SUTURE) ×2 IMPLANT
SUT VIC AB 1 CTX 27 (SUTURE) IMPLANT
SUT VIC AB 2-0 CT1 TAPERPNT 27 (SUTURE) ×6 IMPLANT
SYR 30ML LL (SYRINGE) ×4 IMPLANT
TOWEL GREEN STERILE (TOWEL DISPOSABLE) ×2 IMPLANT
TOWEL GREEN STERILE FF (TOWEL DISPOSABLE) ×2 IMPLANT
TRAY CATH INTERMITTENT SS 16FR (CATHETERS) IMPLANT
TUBE SUCT ARGYLE STRL (TUBING) ×2 IMPLANT
UNDERPAD 30X36 HEAVY ABSORB (UNDERPADS AND DIAPERS) ×2 IMPLANT
WARMER LAPAROSCOPE (MISCELLANEOUS) IMPLANT
YANKAUER SUCT BULB TIP NO VENT (SUCTIONS) ×2 IMPLANT

## 2024-05-15 NOTE — Anesthesia Procedure Notes (Signed)
 Spinal  Patient location during procedure: OR Start time: 05/15/2024 9:14 AM End time: 05/15/2024 9:19 AM Reason for block: surgical anesthesia Staffing Performed: anesthesiologist  Anesthesiologist: Cleotilde Butler Dade, MD Performed by: Cleotilde Butler Dade, MD Authorized by: Cleotilde Butler Dade, MD   Preanesthetic Checklist Completed: patient identified, IV checked, site marked, risks and benefits discussed, surgical consent, monitors and equipment checked, pre-op evaluation and timeout performed Spinal Block Patient position: sitting Prep: DuraPrep Patient monitoring: heart rate, cardiac monitor, continuous pulse ox and blood pressure Approach: midline Location: L3-4 Injection technique: single-shot Needle Needle type: Sprotte  Needle gauge: 24 G Needle length: 9 cm Assessment Sensory level: T4 Events: CSF return

## 2024-05-15 NOTE — H&P (Signed)
 PREOPERATIVE H&P  Chief Complaint: osteoarthritis of right knee  HPI: Jesus Martin is a 63 y.o. male who presents for surgical treatment of osteoarthritis of right knee.  He denies any changes in medical history.  Past Surgical History:  Procedure Laterality Date  . APPENDECTOMY     1975  . BACK SURGERY     1998 - lumbar spine  . CATARACT EXTRACTION Bilateral   . COLONOSCOPY    . INGUINAL HERNIA REPAIR Bilateral    as a child  . NOSE SURGERY     as a child  . TONSILLECTOMY    . UMBILICAL HERNIA REPAIR     as a child   Social History   Socioeconomic History  . Marital status: Married    Spouse name: Jesus Martin  . Number of children: 2  . Years of education: Not on file  . Highest education level: Not on file  Occupational History  . Occupation: disabled  Tobacco Use  . Smoking status: Former    Current packs/day: 0.00    Average packs/day: 0.8 packs/day for 45.0 years (33.8 ttl pk-yrs)    Types: Cigarettes    Quit date: 10/01/2023    Years since quitting: 0.6  . Smokeless tobacco: Current    Types: Snuff  . Tobacco comments:    3/4 pack smoked daily [facility] 12/09/21    Patient and wife quit smoking 10/01/2023.  Vaping Use  . Vaping status: Never Used  Substance and Sexual Activity  . Alcohol use: Not Currently    Comment: twice per year  . Drug use: No  . Sexual activity: Yes  Other Topics Concern  . Not on file  Social History Narrative   Lives with wife Jesus Martin (married 28 years) and son. Has 4 dogs and fish. Not employed. Woodworking for fun. HS graduate. Quit smoking in 09/2023. 26 pack years. No recreational drug use. Drinks twice per year, maybe. Does not exercise. Custody of 3 other children, 10, 11, 15.   Social Drivers of Corporate investment banker Strain: High Risk (03/08/2024)   Overall Financial Resource Strain (CARDIA)   . Difficulty of Paying Living Expenses: Hard  Food Insecurity: No Food Insecurity (03/08/2024)   Hunger Vital Sign    . Worried About Programme researcher, broadcasting/film/video in the Last Year: Never true   . Ran Out of Food in the Last Year: Never true  Transportation Needs: No Transportation Needs (03/08/2024)   PRAPARE - Transportation   . Lack of Transportation (Medical): No   . Lack of Transportation (Non-Medical): No  Physical Activity: Sufficiently Active (03/08/2024)   Exercise Vital Sign   . Days of Exercise per Week: 7 days   . Minutes of Exercise per Session: 60 min  Stress: Stress Concern Present (03/08/2024)   [employer] of Occupational Health - Occupational Stress Questionnaire   . Feeling of Stress: Rather much  Social Connections: Unknown (03/08/2024)   Social Connection and Isolation Panel   . Frequency of Communication with Friends and Family: Once a week   . Frequency of Social Gatherings with Friends and Family: Not on file   . Attends Religious Services: 1 to 4 times per year   . Active Member of Clubs or Organizations: No   . Attends Banker Meetings: Never   . Marital Status: Married   Family History  Adopted: Yes   Allergies  Allergen Reactions  . 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 Nickel Hives    And stainless steel   Prior to Admission medications   Medication Sig Start Date End Date Taking? Authorizing Provider  ALPRAZolam  (XANAX ) 1 MG tablet Take 1 mg by mouth 2 (two) times daily as needed for sleep or anxiety. 04/09/24  Yes [provider]  amLODipine  (NORVASC ) 10 MG tablet Take 1 tablet by mouth once daily 01/10/24  Yes Masters, Katie, DO  atorvastatin  (LIPITOR) 40 MG tablet Take 1 tablet (40 mg total) by mouth daily. 04/11/24  Yes Elnora Ip, MD  carbamazepine  (TEGRETOL ) 200 MG tablet Take 200-600 mg by mouth at bedtime. 01/10/24  Yes [provider]  carvedilol  (COREG ) 12.5 MG tablet Take 1 tablet (12.5 mg total) by mouth 2 (two) times daily with a meal. 04/04/24  Yes Rihner,  Emilie, DO  diclofenac  sodium (VOLTAREN ) 1 % GEL Apply 2 g topically 4 (four) times daily. Patient taking differently: Apply 2 g topically 4 (four) times daily as needed (Pain). 02/03/19  Yes Lissa Earnie RAMAN, MD  docusate sodium  (COLACE) 100 MG capsule Take 1 capsule (100 mg total) by mouth daily as needed. Patient not taking: Reported on 05/09/2024 05/02/24 05/02/25  Jule Ronal CROME, PA-C  doxycycline  (VIBRAMYCIN ) 100 MG capsule Take 1 capsule (100 mg total) by mouth 2 (two) times daily. TO BE TAKEN AFTER SURGERY Patient not taking: Reported on 05/09/2024 05/02/24   Jule Ronal CROME, PA-C  Empagliflozin-metFORMIN  HCl (SYNJARDY ) 12-998 MG TABS Take 1 tablet by mouth twice daily 01/27/24  Yes Masters, Katie, DO  EPINEPHrine 0.3 mg/0.3 mL IJ SOAJ injection Inject 0.3 mg into the muscle once as needed (for anaphylactic reaction to bee stings).   Yes [provider]  Fluticasone -Umeclidin-Vilant (TRELEGY ELLIPTA ) 100-62.5-25 MCG/ACT AEPB Inhale 1 puff by mouth once daily 05/01/24  Yes Rihner, Emilie, DO  hydrocortisone  1 % ointment APPLY  OINTMENT EXTERNALLY TWICE DAILY Patient taking differently: Apply 1 Application topically daily as needed for itching. 12/07/22  Yes Masters, Katie, DO  ipratropium-albuterol  (DUONEB) 0.5-2.5 (3) MG/3ML SOLN Take 3 mLs by nebulization every 4 (four) hours as needed. 10/12/18  Yes Dorrell, Barnie SAILOR, MD  lamoTRIgine  (LAMICTAL ) 200 MG tablet Take 400 mg by mouth at bedtime. 04/08/24  Yes [provider]  losartan -hydrochlorothiazide (HYZAAR) 100-25 MG tablet Take 1 tablet by mouth once daily 01/10/24  Yes Masters, Katie, DO  methimazole  (TAPAZOLE ) 10 MG tablet Take 1 tablet (10 mg total) by mouth daily. 12/27/23  Yes Masters, Katie, DO  methocarbamol  (ROBAXIN ) 500 MG tablet Take 1 tablet (500 mg total) by mouth 2 (two) times daily as needed. Patient not taking: Reported on 05/09/2024 05/02/24   Jule Ronal CROME, PA-C  ondansetron  (ZOFRAN ) 4 MG tablet Take 1 tablet (4  mg total) by mouth every 8 (eight) hours as needed for nausea or vomiting. Patient not taking: Reported on 05/09/2024 05/02/24   Jule Ronal CROME, PA-C  oxyCODONE -acetaminophen  (PERCOCET) 5-325 MG tablet Take 1-2 tablets by mouth every 8 (eight) hours as needed. TO BE TAKEN AFTER SURGERY Patient not taking: Reported on 05/09/2024 05/02/24   Jule Ronal CROME, PA-C  tamsulosin  (FLOMAX ) 0.4 MG CAPS capsule Take 1 capsule by mouth once daily 01/10/24  Yes Masters, Katie, DO  traMADol  (ULTRAM ) 50 MG tablet Take 1 tablet by mouth twice daily as needed 04/10/24  Yes Elnora Ip, MD  traZODone (DESYREL) 50 MG tablet Take 50-150 mg by mouth at bedtime as needed for sleep. 03/27/24  Yes [provider]  VENTOLIN  HFA 108 (90 Base) MCG/ACT inhaler INHALE 2 PUFFS BY MOUTH EVERY 6 HOURS AS NEEDED FOR WHEEZING OR SHORTNESS OF BREATH 09/25/21  Yes Masters, Katie, DO  omeprazole  (PRILOSEC) 40 MG capsule Take 1 capsule (40 mg total) by mouth daily. 05/10/24   Rihner, Emilie, DO  pregabalin  (LYRICA ) 150 MG capsule Take 1 capsule (150 mg total) by mouth 2 (two) times daily. Patient not taking: Reported on 05/09/2024 03/06/24   Marylu Gee, DO     Positive ROS: All other systems have been reviewed and were otherwise negative with the exception of those mentioned in the HPI and as above.  Physical Exam: General: Alert, no acute distress Cardiovascular: No pedal edema Respiratory: No cyanosis, no use of accessory musculature GI: abdomen soft Skin: No lesions in the area of chief complaint Neurologic: Sensation intact distally Psychiatric: Patient is competent for consent with normal mood and affect Lymphatic: no lymphedema  MUSCULOSKELETAL: exam stable  Assessment: osteoarthritis of right knee  Plan: Plan for Procedure(s): ARTHROPLASTY, KNEE, TOTAL  The risks benefits and alternatives were discussed with the patient including but not limited to the risks of nonoperative treatment, versus  surgical intervention including infection, bleeding, nerve injury,  blood clots, cardiopulmonary complications, morbidity, mortality, among others, and they were willing to proceed.   Ozell Cummins, MD 05/15/2024 6:20 AM

## 2024-05-15 NOTE — Evaluation (Signed)
 Physical Therapy Evaluation Patient Details Name: Jesus Martin MRN: [id number] DOB: 1960/12/24 Today's Date: 05/15/2024  History of Present Illness  Patient is a 63 y/o male admitted 05/15/24 for R TKA due to OA. PMH positive for back surgery, B hernia repair, bipolar, former smoker, HTN, hypothyroid.  Clinical Impression  Patient resents with decreased mobility due to R knee pain and limited strength and ROM.  Previously patient independent at home with family, currently needing CGA to supervision for mobility.   Feel he will benefit from skilled PT in the acute setting and from PT at d/c as indicated per MD.      If plan is discharge home, recommend the following: A little help with walking and/or transfers;Help with stairs or ramp for entrance   Can travel by private vehicle        Equipment Recommendations None recommended by PT  Recommendations for Other Services       Functional Status Assessment Patient has had a recent decline in their functional status and demonstrates the ability to make significant improvements in function in a reasonable and predictable amount of time.     Precautions / Restrictions Precautions Precautions: Fall;Knee Restrictions Weight Bearing Restrictions Per Provider Order: Yes RLE Weight Bearing Per Provider Order: Weight bearing as tolerated      Mobility  Bed Mobility Overal bed mobility: Needs Assistance Bed Mobility: Supine to Sit     Supine to sit: Contact guard     General bed mobility comments: assist for balance, lines    Transfers Overall transfer level: Needs assistance Equipment used: Rolling walker (2 wheels) Transfers: Sit to/from Stand Sit to Stand: Contact guard assist           General transfer comment: for balance    Ambulation/Gait Ambulation/Gait assistance: Supervision Gait Distance (Feet): 200 Feet Assistive device: Rolling walker (2 wheels) Gait Pattern/deviations: Step-to pattern, Step-through  pattern, Decreased stride length       General Gait Details: assist for safety and cues for sequence in hallay  Stairs            Wheelchair Mobility     Tilt Bed    Modified Rankin (Stroke Patients Only)       Balance Overall balance assessment: Mild deficits observed, not formally tested (using RW for balance)                                           Pertinent Vitals/Pain Pain Assessment Pain Assessment: Faces Faces Pain Scale: Hurts little more Pain Location: initially no pain though increased with mobility Pain Descriptors / Indicators: Aching, Sore Pain Intervention(s): Monitored during session    Home Living Family/patient expects to be discharged to:: Private residence Living Arrangements: Spouse/significant other;Children Available Help at Discharge: Family Type of Home: House Home Access: Stairs to enter;Ramped entrance       Home Layout: One level Home Equipment: Agricultural consultant (2 wheels)      Prior Function Prior Level of Function : Independent/Modified Independent                     Extremity/Trunk Assessment   Upper Extremity Assessment Upper Extremity Assessment: Overall WFL for tasks assessed    Lower Extremity Assessment Lower Extremity Assessment: RLE deficits/detail RLE Deficits / Details: ankle AROM WFL, lifts leg antigravity and flexion to about 90 in supine with nerve block still  creating some areas of diminished sensations, but strength hip flexion 4/5, knee extension 4+/5 to testing RLE Sensation: decreased light touch (along posterior leg and top of thigh, reports some urinary incontinence due to lack of sensation)    Cervical / Trunk Assessment Cervical / Trunk Assessment: Normal  Communication   Communication Communication: No apparent difficulties    Cognition Arousal: Alert Behavior During Therapy: WFL for tasks assessed/performed   PT - Cognitive impairments: No apparent impairments                          Following commands: Intact       Cueing       General Comments General comments (skin integrity, edema, etc.): Reported numb on perineum and had urine accident; RN aware    Exercises Total Joint Exercises Ankle Circles/Pumps: AROM, Both, Seated Quad Sets: AROM, Seated, Both Long Arc Quad: AROM, Right, 5 reps, Seated   Assessment/Plan    PT Assessment Patient needs continued PT services  PT Problem List Decreased strength;Decreased cognition;Decreased activity tolerance;Decreased balance;Decreased mobility;Pain;Decreased knowledge of use of DME       PT Treatment Interventions DME instruction;Gait training;Patient/family education;Functional mobility training;Therapeutic activities;Therapeutic exercise    PT Goals (Current goals can be found in the Care Plan section)  Acute Rehab PT Goals Patient Stated Goal: return home indpendent PT Goal Formulation: With patient Time For Goal Achievement: 05/22/24 Potential to Achieve Goals: Good    Frequency 7X/week     Co-evaluation               AM-PAC PT 6 Clicks Mobility  Outcome Measure Help needed turning from your back to your side while in a flat bed without using bedrails?: None Help needed moving from lying on your back to sitting on the side of a flat bed without using bedrails?: None Help needed moving to and from a bed to a chair (including a wheelchair)?: A Little Help needed standing up from a chair using your arms (e.g., wheelchair or bedside chair)?: A Little Help needed to walk in hospital room?: A Little Help needed climbing 3-5 steps with a railing? : A Little 6 Click Score: 20    End of Session Equipment Utilized During Treatment: Gait belt Activity Tolerance: Patient tolerated treatment well Patient left: in chair;with call bell/phone within reach;with chair alarm set   PT Visit Diagnosis: Difficulty in walking, not elsewhere classified (R26.2);Pain Pain -  Right/Left: Right Pain - part of body: Knee    Time: 1519-1550 PT Time Calculation (min) (ACUTE ONLY): 31 min   Charges:   PT Evaluation $PT Eval Moderate Complexity: 1 Mod PT Treatments $Gait Training: 8-22 mins PT General Charges $$ ACUTE PT VISIT: 1 Visit         Micheline Portal, PT Acute Rehabilitation Services Office:[phone] 05/15/2024   Montie Portal 05/15/2024, 4:44 PM

## 2024-05-15 NOTE — Discharge Instructions (Signed)

## 2024-05-15 NOTE — Transfer of Care (Signed)
 Immediate Anesthesia Transfer of Care Note  Patient: Jesus Martin  Procedure(s) Performed: RIGHT TOTAL KNEE ARTHROPLASTY (Right: Knee)  Patient Location: PACU  Anesthesia Type:MAC, Regional, and Spinal  Level of Consciousness: awake, alert , and oriented  Airway & Oxygen  Therapy: Patient Spontanous Breathing  Post-op Assessment: Report given to RN and Post -op Vital signs reviewed and stable  Post vital signs: Reviewed and stable  Last Vitals:  Vitals Value Taken Time  BP 115/73 05/15/24 11:11  Temp    Pulse 69 05/15/24 11:12  Resp 14 05/15/24 11:12  SpO2 97 % 05/15/24 11:12  Vitals shown include unfiled device data.  Last Pain:  Vitals:   05/15/24 0718  TempSrc:   PainSc: 4       Patients Stated Pain Goal: 2 (05/15/24 9281)  Complications: No notable events documented.

## 2024-05-15 NOTE — Anesthesia Postprocedure Evaluation (Signed)
 Anesthesia Post Note  Patient: Jesus Martin  Procedure(s) Performed: RIGHT TOTAL KNEE ARTHROPLASTY (Right: Knee)     Patient location during evaluation: PACU Anesthesia Type: Spinal Level of consciousness: awake and alert Pain management: pain level controlled Vital Signs Assessment: post-procedure vital signs reviewed and stable Respiratory status: spontaneous breathing, nonlabored ventilation and respiratory function stable Cardiovascular status: blood pressure returned to baseline and stable Postop Assessment: no apparent nausea or vomiting Anesthetic complications: no   No notable events documented.  Last Vitals:  Vitals:   05/15/24 1145 05/15/24 1206  BP: 134/70 130/78  Pulse: (!) 58 (!) 57  Resp: 11 20  Temp: 36.5 C 36.6 C  SpO2: 100% 100%    Last Pain:  Vitals:   05/15/24 1145  TempSrc:   PainSc: 0-No pain                 Butler Levander Pinal

## 2024-05-15 NOTE — Anesthesia Preprocedure Evaluation (Addendum)
 Anesthesia Evaluation  Patient identified by MRN, date of birth, ID band Patient awake    Reviewed: Allergy & Precautions, H&P , NPO status , Patient's Chart, lab work & pertinent test results  Airway Mallampati: II  TM Distance: >3 FB Neck ROM: Full    Dental  (+) Dental Advisory Given   Pulmonary asthma , COPD, former smoker   Pulmonary exam normal breath sounds clear to auscultation       Cardiovascular hypertension, Pt. on medications negative cardio ROS Normal cardiovascular exam Rhythm:Regular Rate:Normal     Neuro/Psych   Anxiety  Bipolar Disorder   negative neurological ROS  negative psych ROS   GI/Hepatic Neg liver ROS,GERD  ,,  Endo/Other  diabetes, Type 2 Hyperthyroidism   Renal/GU negative Renal ROS  negative genitourinary   Musculoskeletal  (+) Arthritis , Osteoarthritis,  Fibromyalgia -  Abdominal   Peds negative pediatric ROS (+)  Hematology negative hematology ROS (+)   Anesthesia Other Findings   Reproductive/Obstetrics negative OB ROS                              Anesthesia Physical Anesthesia Plan  ASA: 3  Anesthesia Plan: Spinal   Post-op Pain Management: Regional block*   Induction: Intravenous  PONV Risk Score and Plan: 1 and Propofol infusion and Treatment may vary due to age or medical condition  Airway Management Planned: Simple Face Mask  Additional Equipment:   Intra-op Plan:   Post-operative Plan:   Informed Consent: I have reviewed the patients History and Physical, chart, labs and discussed the procedure including the risks, benefits and alternatives for the proposed anesthesia with the patient or authorized representative who has indicated his/her understanding and acceptance.     Dental advisory given  Plan Discussed with: CRNA  Anesthesia Plan Comments:         Anesthesia Quick Evaluation

## 2024-05-15 NOTE — Plan of Care (Signed)

## 2024-05-15 NOTE — Op Note (Signed)
 Total Knee Arthroplasty Procedure Note  Preoperative diagnosis: Right knee osteoarthritis  Postoperative diagnosis:same  Operative findings: Complete loss of articular cartilage from femoral condyles Excellent bone quality  Operative procedure: Right total knee arthroplasty. CPT [phone]  Surgeon: N. Ozell Cummins, MD  Assist: Ronal Morna Grave, PA-C; necessary for the timely completion of procedure and due to complexity of procedure.  Anesthesia: Spinal, regional  Tourniquet time: see anesthesia record  Implants used: Zimmer persona press fit Femur: CR 10 nickel free Tibia: E Patella: 32 mm Polyethylene: 10 mm medial congruent  Indication: Jesus Martin is a 63 y.o. year old male with a history of knee pain. Having failed conservative management, the patient elected to proceed with a total knee arthroplasty.  We have reviewed the risk and benefits of the surgery and they elected to proceed after voicing understanding.  Procedure:  After informed consent was obtained and understanding of the risk were voiced including but not limited to bleeding, infection, damage to surrounding structures including nerves and vessels, blood clots, leg length inequality and the failure to achieve desired results, the operative extremity was marked with verbal confirmation of the patient in the holding area.   The patient was then brought to the operating room and transported to the operating room table in the supine position.  A tourniquet was applied to the operative extremity around the upper thigh. The operative limb was then prepped and draped in the usual sterile fashion and preoperative antibiotics were administered.  A time out was performed prior to the start of surgery confirming the correct extremity, preoperative antibiotic administration, as well as team members, implants and instruments available for the case. Correct surgical site was also confirmed with preoperative  radiographs. The limb was then elevated for exsanguination and the tourniquet was inflated. A midline incision was made and a standard medial parapatellar approach was performed.  The patella was everted which showed complete loss of articular cartilage.  The patella was resected down to 15 mm and sized to a 32 mm.  A cover was placed on the patella for protection from retractors.  We then turned our attention to the femur.  The ACL was sacrificed. Start site was drilled in the femur and the intramedullary distal femoral cutting guide was placed, set at 5 degrees valgus, taking 10 mm of distal resection. The distal cut was made. Osteophytes were then removed.   Next, the proximal tibial cutting guide was placed with appropriate slope, varus/valgus alignment and depth of resection. A drop rod was attached to confirm that it was pointed to the second metatarsal.  The proximal tibial cut was made taking 4 mm off the low side. Gap blocks were then used to assess the extension gap and alignment, and appropriate soft tissue releases were performed. Attention was turned back to the femur, which was sized using the sizing guide to a size 10. Appropriate rotation of the femoral component was determined using epicondylar axis, Whiteside's line, and assessing the flexion gap under ligament tension. The appropriate size 4-in-1 cutting block was placed and cuts were made.  Posterior femoral osteophytes and uncapped bone were then removed with the curved osteotome.  Trial components were placed, and stability was checked in full extension, mid-flexion, and deep flexion.  The PCL was retained.  The patella tracked well without a lateral release. Trial components were then removed and tibial preparation performed.  The tibial trial was pointed to the medial third of the tibial tubercle.  The tibia was  sized for a size E component and prepped.  Trial components were removed.   The bony surfaces were irrigated with a pulse  lavage and then dried. Final components were placed.  The final polyethylene liner, 10 mm thick, was inserted and checked to ensure the locking mechanism had engaged appropriately.  The stability of the construct was re-evaluated throughout a range of motion and found to be acceptable.  The tourniquet was deflated and hemostasis was achieved. The wound was irrigated with normal saline.  One gram of vancomycin  powder was placed in the surgical bed.  Topical mixture of 0.25% bupivacaine and meloxicam  was placed in the joint for postoperative pain.  Capsular closure was performed with a #1 statafix in flexion, subcutaneous fat closed with a 0 vicryl suture, then subcutaneous tissue closed with interrupted 2.0 vicryl suture. The skin was then closed with a 2.0 nylon and dermabond. A sterile dressing was applied.  The patient was awakened in the operating room and taken to recovery in stable condition. All sponge, needle, and instrument counts were correct at the end of the case.  Morna Grave was necessary for opening, closing, retracting, limb positioning and overall facilitation and completion of the surgery.  Position: supine  Complications: none.  Time Out: performed  Drains/Packing: none Estimated blood loss: minimal Returned to Recovery Room: in good condition.   Mechanical VTE (DVT) Prophylaxis: sequential compression devices, TED thigh-high  Chemical VTE (DVT) Prophylaxis: eliquis POD 1  Fluid Replacement  Crystalloid: see anesthesia record Blood: none  FFP: none   Specimens Removed: 1 to pathology  Sponge and Instrument Count Correct? yes  PACU: portable radiograph - knee AP and Lateral  Plan/RTC: Return in 2 weeks for suture removal.  Weight Bearing/Load Lower Extremity: full   Implant Name Type Inv. Item Serial No. Manufacturer Lot No. LRB No. Used Action  PERSONA KNEE SYSTEM SULUTION RIGHT FEMUR    ZIMMER KNEE [id number] Right 1 Implanted  INSERT TIBIA KNEE RIGHT 10 - [id number]  Joint INSERT TIBIA KNEE RIGHT 10  ZIMMER RECON(ORTH,TRAU,BIO,SG) [id number] Right 1 Implanted  COMPONENT PATELLA PEG 3 32 - [id number] Joint COMPONENT PATELLA PEG 3 32  ZIMMER RECON(ORTH,TRAU,BIO,SG) [id number] Right 1 Implanted  COMPONET TIB PS KNEE 0D E RT - [id number] Joint COMPONET TIB PS KNEE 0D E RT  ZIMMER RECON(ORTH,TRAU,BIO,SG) [id number] Right 1 Implanted    N. Ozell Cummins, MD [facility] 10:31 AM

## 2024-05-15 NOTE — Progress Notes (Signed)
 Orthopedic Tech Progress Note Patient Details:  Jesus Martin [facility] [birth year] [id number]  Ortho Devices Type of Ortho Device: Bone foam zero knee Ortho Device/Splint Location: RLE Ortho Device/Splint Interventions: Ordered, Application, Adjustment   Post Interventions Patient Tolerated: Well Instructions Provided: Care of device  [name] Pac 05/15/2024, 11:28 AM

## 2024-05-15 NOTE — Anesthesia Procedure Notes (Signed)
 Anesthesia Regional Block: Adductor canal block   Pre-Anesthetic Checklist: , timeout performed,  Correct Patient, Correct Site, Correct Laterality,  Correct Procedure, Correct Position, site marked,  Risks and benefits discussed,  Surgical consent,  Pre-op evaluation,  At surgeon's request and post-op pain management  Laterality: Right  Prep: chloraprep       Needles:  Injection technique: Single-shot  Needle Type: Stimiplex     Needle Length: 9cm  Needle Gauge: 21     Additional Needles:   Procedures:,,,, ultrasound used (permanent image in chart),,    Narrative:  Start time: 05/15/2024 8:31 AM End time: 05/15/2024 8:36 AM Injection made incrementally with aspirations every 5 mL.  Performed by: Personally  Anesthesiologist: Cleotilde Butler Dade, MD

## 2024-05-16 ENCOUNTER — Encounter (HOSPITAL_COMMUNITY): Payer: Self-pay | Admitting: Orthopaedic Surgery

## 2024-05-16 DIAGNOSIS — M1711 Unilateral primary osteoarthritis, right knee: Secondary | ICD-10-CM | POA: Diagnosis not present

## 2024-05-16 LAB — GLUCOSE, CAPILLARY: Glucose-Capillary: 103 mg/dL — ABNORMAL HIGH (ref 70–99)

## 2024-05-16 NOTE — Progress Notes (Signed)
 Physical Therapy Treatment  Patient Details Name: Jesus Martin MRN: [id number] DOB: Dec 06, 1960 Today's Date: 05/16/2024   History of Present Illness Patient is a 63 y/o male admitted 05/15/24 for R TKA due to OA. PMH positive for back surgery, B hernia repair, bipolar, former smoker, HTN, hypothyroid.    PT Comments  Pt progressing towards physical therapy goals. Was able to perform transfers and ambulation with mod I to supervision and RW for support. Reviewed HEP, car transfer, positioning recommendations, and appropriate activity progression. Will continue to follow.    If plan is discharge home, recommend the following: A little help with walking and/or transfers;Help with stairs or ramp for entrance   Can travel by private vehicle        Equipment Recommendations  None recommended by PT    Recommendations for Other Services       Precautions / Restrictions Precautions Precautions: Fall;Knee Restrictions Weight Bearing Restrictions Per Provider Order: Yes RLE Weight Bearing Per Provider Order: Weight bearing as tolerated     Mobility  Bed Mobility               General bed mobility comments: Pt was received sitting up in recliner.    Transfers Overall transfer level: Modified independent Equipment used: Rolling walker (2 wheels) Transfers: Sit to/from Stand             General transfer comment: No assist to power up to full stand. Pt demonstrated proper hand placement on seated surface for safety.    Ambulation/Gait Ambulation/Gait assistance: Supervision Gait Distance (Feet): 300 Feet Assistive device: Rolling walker (2 wheels) Gait Pattern/deviations: Step-to pattern, Step-through pattern, Decreased stride length Gait velocity: Decreased Gait velocity interpretation: 1.31 - 2.62 ft/sec, indicative of limited community ambulator   General Gait Details: VC's for improved posture, closer walker proximity and forward gaze. Pt able to adjust  step/stride length and improve heel strike as well on the R.   Stairs             Wheelchair Mobility     Tilt Bed    Modified Rankin (Stroke Patients Only)       Balance Overall balance assessment: Mild deficits observed, not formally tested                                          Communication Communication Communication: No apparent difficulties  Cognition Arousal: Alert Behavior During Therapy: WFL for tasks assessed/performed   PT - Cognitive impairments: No apparent impairments                         Following commands: Intact      Cueing    Exercises Total Joint Exercises Ankle Circles/Pumps: 10 reps Quad Sets: 10 reps Towel Squeeze: 10 reps Short Arc Quad: 10 reps Heel Slides: 10 reps Hip ABduction/ADduction: 10 reps Straight Leg Raises: 10 reps Long Arc Quad: 10 reps Knee Flexion: 10 reps Goniometric ROM: 3-101    General Comments        Pertinent Vitals/Pain Pain Assessment Pain Assessment: Faces Faces Pain Scale: Hurts a little bit Pain Location: knee Pain Descriptors / Indicators: Aching Pain Intervention(s): Limited activity within patient's tolerance, Monitored during session, Repositioned    Home Living  Prior Function            PT Goals (current goals can now be found in the care plan section) Acute Rehab PT Goals Patient Stated Goal: return home indpendent PT Goal Formulation: With patient Time For Goal Achievement: 05/22/24 Potential to Achieve Goals: Good Progress towards PT goals: Progressing toward goals    Frequency    7X/week      PT Plan      Co-evaluation              AM-PAC PT 6 Clicks Mobility   Outcome Measure  Help needed turning from your back to your side while in a flat bed without using bedrails?: None Help needed moving from lying on your back to sitting on the side of a flat bed without using bedrails?: None Help  needed moving to and from a bed to a chair (including a wheelchair)?: A Little Help needed standing up from a chair using your arms (e.g., wheelchair or bedside chair)?: None Help needed to walk in hospital room?: A Little Help needed climbing 3-5 steps with a railing? : A Little 6 Click Score: 21    End of Session Equipment Utilized During Treatment: Gait belt Activity Tolerance: Patient tolerated treatment well Patient left: in chair;with call bell/phone within reach;with chair alarm set   PT Visit Diagnosis: Difficulty in walking, not elsewhere classified (R26.2);Pain Pain - Right/Left: Right Pain - part of body: Knee     Time: 8961-8889 PT Time Calculation (min) (ACUTE ONLY): 32 min  Charges:    $Gait Training: 8-22 mins $Therapeutic Exercise: 8-22 mins PT General Charges $$ ACUTE PT VISIT: 1 Visit                     Leita Sable, PT, DPT Acute Rehabilitation Services Secure Chat Preferred Office: [phone]    [name] 05/16/2024, 3:31 PM

## 2024-05-16 NOTE — Evaluation (Signed)
 Occupational Therapy Evaluation Patient Details Name: Jesus Martin MRN: [id number] DOB: 10/07/1960 Today's Date: 05/16/2024   History of Present Illness   Patient is a 63 y/o male admitted 05/15/24 for R TKA due to OA. PMH positive for back surgery, B hernia repair, bipolar, former smoker, HTN, hypothyroid.     Clinical Impressions Patient admitted for the procedure above.  Close to baseline, no assist for in room mobility at RW level, or ADL completion at sit to stand level.  No further OT needs in the acute setting and no HH OT is required.       If plan is discharge home, recommend the following:   Assist for transportation     Functional Status Assessment   Patient has had a recent decline in their functional status and demonstrates the ability to make significant improvements in function in a reasonable and predictable amount of time.     Equipment Recommendations   None recommended by OT     Recommendations for Other Services         Precautions/Restrictions   Precautions Precautions: Fall;Knee Restrictions Weight Bearing Restrictions Per Provider Order: Yes RLE Weight Bearing Per Provider Order: Weight bearing as tolerated     Mobility Bed Mobility Overal bed mobility: Modified Independent                  Transfers Overall transfer level: Modified independent Equipment used: Rolling walker (2 wheels)                      Balance Overall balance assessment: Mild deficits observed, not formally tested                                         ADL either performed or assessed with clinical judgement   ADL Overall ADL's : Modified independent                                             Vision Baseline Vision/History: 1 Wears glasses Patient Visual Report: No change from baseline       Perception Perception: Not tested       Praxis Praxis: Not tested       Pertinent Vitals/Pain  Pain Assessment Pain Assessment: Faces Faces Pain Scale: Hurts a little bit Pain Location: knee Pain Descriptors / Indicators: Aching Pain Intervention(s): Monitored during session     Extremity/Trunk Assessment Upper Extremity Assessment Upper Extremity Assessment: Overall WFL for tasks assessed   Lower Extremity Assessment Lower Extremity Assessment: Defer to PT evaluation       Communication Communication Communication: No apparent difficulties   Cognition Arousal: Alert Behavior During Therapy: WFL for tasks assessed/performed Cognition: No apparent impairments                               Following commands: Intact                          Home Living Family/patient expects to be discharged to:: Private residence Living Arrangements: Spouse/significant other;Children Available Help at Discharge: Family Type of Home: House Home Access: Stairs to enter;Ramped entrance     Home Layout: One level  Bathroom Shower/Tub: Chief Strategy Officer: Standard     Home Equipment: Agricultural consultant (2 wheels)          Prior Functioning/Environment Prior Level of Function : Independent/Modified Independent                    OT Problem List: Pain   OT Treatment/Interventions:        OT Goals(Current goals can be found in the care plan section)   Acute Rehab OT Goals Patient Stated Goal: Return home OT Goal Formulation: With patient Time For Goal Achievement: 05/19/24 Potential to Achieve Goals: Good   OT Frequency:       Co-evaluation              AM-PAC OT 6 Clicks Daily Activity     Outcome Measure Help from another person eating meals?: None Help from another person taking care of personal grooming?: None Help from another person toileting, which includes using toliet, bedpan, or urinal?: None Help from another person bathing (including washing, rinsing, drying)?: None Help from another person to  put on and taking off regular upper body clothing?: None Help from another person to put on and taking off regular lower body clothing?: None 6 Click Score: 24   End of Session Equipment Utilized During Treatment: Rolling walker (2 wheels)  Activity Tolerance: Patient tolerated treatment well Patient left: in bed;with call bell/phone within reach  OT Visit Diagnosis: Pain Pain - Right/Left: Right Pain - part of body: Knee                Time: 9194-9177 OT Time Calculation (min): 17 min Charges:  OT General Charges $OT Visit: 1 Visit OT Evaluation $OT Eval Moderate Complexity: 1 Mod  05/16/2024  RP, OTR/L  Acute Rehabilitation Services  Office:  [phone]   Charlie JONETTA Halsted 05/16/2024, 8:29 AM

## 2024-05-16 NOTE — Progress Notes (Signed)
 Patient alert and oriented, mae's well, voiding adequate amount of urine, swallowing without difficulty, c/o mild pain at time of discharge. Patient discharged home with wife. Script and discharged instructions given to patient. Patient and wife stated understanding of instructions given. Room was checked and accounted for all patient's belongings; discharge instructions concerning his medications, incision care, follow up appointment and when to call the doctor as needed were all discussed with patient by RN and he expressed understanding on the instructions given.

## 2024-05-16 NOTE — Care Management Obs Status (Signed)
 MEDICARE OBSERVATION STATUS NOTIFICATION   Patient Details  Name: Jesus Martin MRN: [id number] Date of Birth: Jun 23, 1961   Medicare Observation Status Notification Given:  Yes    Andrez JULIANNA George, RN 05/16/2024, 9:48 AM

## 2024-05-16 NOTE — Progress Notes (Signed)
 Subjective: 1 Day Post-Op Procedure(s) (LRB): RIGHT TOTAL KNEE ARTHROPLASTY (Right) Patient reports pain as mild.    Objective: Vital signs in last 24 hours: Temp:  [97.7 F (36.5 C)-98.4 F (36.9 C)] 97.9 F (36.6 C) (10/07 0733) Pulse Rate:  [57-83] 70 (10/07 0733) Resp:  [11-20] 18 (10/07 0733) BP: (108-146)/(70-94) 130/86 (10/07 0733) SpO2:  [94 %-100 %] 97 % (10/07 0733)  Intake/Output from previous day: 10/06 0701 - 10/07 0700 In: 1720 [P.O.:720; I.V.:900; IV Piggyback:100] Out: 250 [Urine:200; Blood:50] Intake/Output this shift: No intake/output data recorded.  No results for input(s): HGB in the last 72 hours. No results for input(s): WBC, RBC, HCT, PLT in the last 72 hours. No results for input(s): NA, K, CL, CO2, BUN, CREATININE, GLUCOSE, CALCIUM  in the last 72 hours. No results for input(s): LABPT, INR in the last 72 hours.  Neurologically intact Neurovascular intact Sensation intact distally Intact pulses distally Dorsiflexion/Plantar flexion intact Incision: scant drainage No cellulitis present Compartment soft   Assessment/Plan: 1 Day Post-Op Procedure(s) (LRB): RIGHT TOTAL KNEE ARTHROPLASTY (Right) Advance diet Up with therapy D/C IV fluids Discharge home with home health once cleared by PT WBAT RLE      Jesus Martin Grave 05/16/2024, 8:00 AM

## 2024-05-16 NOTE — Discharge Summary (Signed)
 Patient ID: Jesus Martin MRN: [id number] DOB/AGE: 09/21/1960 63 y.o.  Admit date: 05/15/2024 Discharge date: 05/16/2024  Admission Diagnoses:  Principal Problem:   Primary osteoarthritis of right knee Active Problems:   Status post total right knee replacement   Discharge Diagnoses:  Same  Past Medical History:  Diagnosis Date   Anxiety    Arthritis    Asthma    childhood asthma. no an issues as an adult   Bipolar I disorder [facility]) 1991   Dr. Vincente   Borderline hyperlipidemia    history of   COPD (chronic obstructive pulmonary disease) (HCC)    Diabetes mellitus, new onset (HCC) 12/22/2017   Fibromyalgia    GERD (gastroesophageal reflux disease)    History of kidney stones    HTN (hypertension)    Hyperthyroidism    Lumbar vertebral fracture (HCC) 01/29/2019   L1   Pneumonia    Tobacco abuse    Viral conjunctivitis of both eyes 08/14/2022   Onset 2 days ago. Eyes are itchy and painful. Pt having tearing of both eyes.    Vitamin D  deficiency     Surgeries: Procedure(s): RIGHT TOTAL KNEE ARTHROPLASTY on 05/15/2024   Consultants:   Discharged Condition: Improved  Hospital Course: Jesus Martin is an 63 y.o. male who was admitted 05/15/2024 for operative treatment ofPrimary osteoarthritis of right knee. Patient has severe unremitting pain that affects sleep, daily activities, and work/hobbies. After pre-op clearance the patient was taken to the operating room on 05/15/2024 and underwent  Procedure(s): RIGHT TOTAL KNEE ARTHROPLASTY.    Patient was given perioperative antibiotics:  Anti-infectives (From admission, onward)    Start     Dose/Rate Route Frequency Ordered Stop   05/16/24 1000  doxycycline  (VIBRA -TABS) tablet 100 mg       Note to Pharmacy: TO BE TAKEN AFTER SURGERY     100 mg Oral 2 times daily 05/15/24 1151     05/15/24 1500  ceFAZolin  (ANCEF ) IVPB 2g/100 mL premix        2 g 200 mL/hr over 30 Minutes Intravenous Every 6 hours 05/15/24 1151  05/15/24 2052   05/15/24 0952  vancomycin  (VANCOCIN ) powder  Status:  Discontinued          As needed 05/15/24 0952 05/15/24 1115   05/15/24 0715  ceFAZolin  (ANCEF ) IVPB 2g/100 mL premix        2 g 200 mL/hr over 30 Minutes Intravenous On call to O.R. 05/15/24 9297 05/15/24 9072        Patient was given sequential compression devices, early ambulation, and chemoprophylaxis to prevent DVT.  Inpatient Morphine Milligram Equivalents Per Day 10/6 - 10/7   Values displayed are in units of MME/Day    Order Start / End Date Yesterday Today    oxyCODONE  (Oxy IR/ROXICODONE ) immediate release tablet 5 mg 10/6 - 10/6 0 of Unknown --    oxyCODONE  (ROXICODONE ) 5 MG/5ML solution 5 mg 10/6 - 10/6 0 of Unknown --      Group total: 0 of Unknown     oxyCODONE  (Oxy IR/ROXICODONE ) immediate release tablet 5 mg 10/6 - No end date 7.5 of 22.5 0 of 30    oxyCODONE  (Oxy IR/ROXICODONE ) immediate release tablet 10 mg 10/6 - No end date 0 of 45 30 of 60    HYDROmorphone  (DILAUDID ) injection 1 mg 10/6 - No end date 0 of 40 0 of 60    fentaNYL  (SUBLIMAZE ) 100 MCG/2ML injection 10/6 - 10/6 0 of Unknown --  HYDROmorphone  (DILAUDID ) injection 0.25-0.5 mg 10/6 - 10/6 0 of 40-80 --    Daily Totals  7.5 of Unknown (at least 147.5-187.5) 30 of 150    Calculation Errors     Order Type Date Details   fentaNYL  (SUBLIMAZE ) 100 MCG/2ML injection Ordered Dose -- Frequency type could not be determined   oxyCODONE  (Oxy IR/ROXICODONE ) immediate release tablet 5 mg Ordered Dose -- Insufficient frequency information   oxyCODONE  (ROXICODONE ) 5 MG/5ML solution 5 mg Ordered Dose -- Insufficient frequency information            Patient benefited maximally from hospital stay and there were no complications.    Recent vital signs: Patient Vitals for the past 24 hrs:  BP Temp Temp src Pulse Resp SpO2  05/16/24 0733 130/86 97.9 F (36.6 C) Oral 70 18 97 %  05/16/24 0559 134/88 98.3 F (36.8 C) Oral 67 18 100 %  05/15/24  2257 119/73 97.8 F (36.6 C) Oral 74 18 97 %  05/15/24 1956 128/85 97.9 F (36.6 C) Oral 72 18 94 %  05/15/24 1625 (!) 146/94 98.4 F (36.9 C) Oral 83 20 100 %  05/15/24 1206 130/78 97.8 F (36.6 C) -- (!) 57 20 100 %  05/15/24 1145 134/70 97.7 F (36.5 C) -- (!) 58 11 100 %  05/15/24 1130 130/79 -- -- 71 20 97 %  05/15/24 1115 108/81 97.7 F (36.5 C) -- 67 18 97 %     Recent laboratory studies: No results for input(s): WBC, HGB, HCT, PLT, NA, K, CL, CO2, BUN, CREATININE, GLUCOSE, INR, CALCIUM  in the last 72 hours.  Invalid input(s): PT, 2   Discharge Medications:   Allergies as of 05/16/2024       Reactions   Yellow Jacket Venom [bee Venom] Anaphylaxis, Other (See Comments)   And the patient passes out   Nickel Hives   And stainless steel        Medication List     STOP taking these medications    traMADol  50 MG tablet Commonly known as: ULTRAM        TAKE these medications    ALPRAZolam  1 MG tablet Commonly known as: XANAX  Take 1 mg by mouth 2 (two) times daily as needed for sleep or anxiety.   amLODipine  10 MG tablet Commonly known as: NORVASC  Take 1 tablet by mouth once daily   atorvastatin  40 MG tablet Commonly known as: LIPITOR Take 1 tablet (40 mg total) by mouth daily.   carbamazepine  200 MG tablet Commonly known as: TEGRETOL  Take 200-600 mg by mouth at bedtime.   carvedilol  12.5 MG tablet Commonly known as: COREG  Take 1 tablet (12.5 mg total) by mouth 2 (two) times daily with a meal.   diclofenac  sodium 1 % Gel Commonly known as: VOLTAREN  Apply 2 g topically 4 (four) times daily. What changed:  when to take this reasons to take this   docusate sodium  100 MG capsule Commonly known as: Colace Take 1 capsule (100 mg total) by mouth daily as needed.   doxycycline  100 MG capsule Commonly known as: Vibramycin  Take 1 capsule (100 mg total) by mouth 2 (two) times daily. TO BE TAKEN AFTER SURGERY   Eliquis 2.5  MG Tabs tablet Generic drug: apixaban Take 1 tablet (2.5 mg total) by mouth 2 (two) times daily for  30 days post-op to prevent blood clots.   EPINEPHrine 0.3 mg/0.3 mL Soaj injection Commonly known as: EPI-PEN Inject 0.3 mg into the muscle once as needed (for  anaphylactic reaction to bee stings).   hydrocortisone  1 % ointment APPLY  OINTMENT EXTERNALLY TWICE DAILY What changed: See the new instructions.   ipratropium-albuterol  0.5-2.5 (3) MG/3ML Soln Commonly known as: DUONEB Take 3 mLs by nebulization every 4 (four) hours as needed.   lamoTRIgine  200 MG tablet Commonly known as: LAMICTAL  Take 400 mg by mouth at bedtime.   losartan -hydrochlorothiazide 100-25 MG tablet Commonly known as: HYZAAR Take 1 tablet by mouth once daily   methimazole  10 MG tablet Commonly known as: TAPAZOLE  Take 1 tablet (10 mg total) by mouth daily.   methocarbamol  500 MG tablet Commonly known as: ROBAXIN  Take 1 tablet (500 mg total) by mouth 2 (two) times daily as needed.   omeprazole  40 MG capsule Commonly known as: PRILOSEC Take 1 capsule (40 mg total) by mouth daily.   ondansetron  4 MG tablet Commonly known as: Zofran  Take 1 tablet (4 mg total) by mouth every 8 (eight) hours as needed for nausea or vomiting.   oxyCODONE -acetaminophen  5-325 MG tablet Commonly known as: Percocet Take 1-2 tablets by mouth every 8 (eight) hours as needed. TO BE TAKEN AFTER SURGERY   pregabalin  150 MG capsule Commonly known as: LYRICA  Take 1 capsule (150 mg total) by mouth 2 (two) times daily.   Synjardy  12-998 MG Tabs Generic drug: Empagliflozin-metFORMIN  HCl Take 1 tablet by mouth twice daily   tamsulosin  0.4 MG Caps capsule Commonly known as: FLOMAX  Take 1 capsule by mouth once daily   traZODone 50 MG tablet Commonly known as: DESYREL Take 50-150 mg by mouth at bedtime as needed for sleep.   Trelegy Ellipta  100-62.5-25 MCG/ACT Aepb Generic drug: Fluticasone -Umeclidin-Vilant Inhale 1 puff by  mouth once daily   Ventolin  HFA 108 (90 Base) MCG/ACT inhaler Generic drug: albuterol  INHALE 2 PUFFS BY MOUTH EVERY 6 HOURS AS NEEDED FOR WHEEZING OR SHORTNESS OF BREATH               Durable Medical Equipment  (From admission, onward)           Start     Ordered   05/15/24 1151  DME Walker rolling  Once       Question Answer Comment  Walker: With 5 Inch Wheels   Patient needs a walker to treat with the following condition Status post left partial knee replacement      05/15/24 1151   05/15/24 1151  DME 3 n 1  Once        05/15/24 1151   05/15/24 1151  DME Bedside commode  Once       Question:  Patient needs a bedside commode to treat with the following condition  Answer:  Status post left partial knee replacement   05/15/24 1151            Diagnostic Studies: DG Knee Right Port Result Date: 05/15/2024 CLINICAL DATA:  Postop. EXAM: PORTABLE RIGHT KNEE - 1-2 VIEW COMPARISON:  04/06/2024 FINDINGS: Right knee arthroplasty in expected alignment. No periprosthetic lucency or fracture. Recent postsurgical change includes air and edema in the soft tissues and joint space. IMPRESSION: Right knee arthroplasty without immediate postoperative complication. Electronically Signed   By: Andrea Gasman M.D.   On: 05/15/2024 13:29    Disposition: Discharge disposition: 01-Home or Self Care          Follow-up Information     Jule Ronal CROME, PA-C. Schedule an appointment as soon as possible for a visit in 2 week(s).   Specialty: Orthopedic Surgery Contact information: [phone]  [place]  [place] [address] [phone]         [facility] Follow up.   Why: [facility] will contact you for the first home visit. PT/OT Contact information: [phone]                 Signed: Ronal LITTIE Grave 05/16/2024, 8:01 AM

## 2024-05-19 ENCOUNTER — Telehealth: Payer: Self-pay | Admitting: Radiology

## 2024-05-19 NOTE — Telephone Encounter (Signed)
 I spoke with patient's wife. He has increased pain down his shin and into his ankle. He also has bruising in his thigh. I explained, per Manuelita, bruising likely due to Eliquis and tourniquet for surgery. Also advised, per Manuelita, that patient can increase pain medication to 1-2 q 6 hours prn.  Advised to ice and elevate. Also explained someone was on call for office should she need us  over the weekend. We discussed pain worse days 3-4 and hopefully he will turn the corner tomorrow. She expressed understanding.

## 2024-05-20 NOTE — Telephone Encounter (Signed)
Thanks Jesus Martin

## 2024-05-23 ENCOUNTER — Other Ambulatory Visit: Payer: Self-pay | Admitting: Physician Assistant

## 2024-05-23 ENCOUNTER — Telehealth: Payer: Self-pay | Admitting: Orthopaedic Surgery

## 2024-05-23 MED ORDER — OXYCODONE-ACETAMINOPHEN 5-325 MG PO TABS: 1.0000 | ORAL_TABLET | Freq: Three times a day (TID) | ORAL | 0 refills | Status: DC | PRN

## 2024-05-23 NOTE — Telephone Encounter (Signed)
 Patient's wife called. Says he needs a refill on his pain medications.

## 2024-05-23 NOTE — Telephone Encounter (Signed)
 sent

## 2024-05-26 ENCOUNTER — Other Ambulatory Visit: Payer: Self-pay | Admitting: Physician Assistant

## 2024-05-26 ENCOUNTER — Telehealth: Payer: Self-pay | Admitting: Radiology

## 2024-05-26 NOTE — Telephone Encounter (Signed)
 Spoke to annette as well as patient's wife. Sounds like he doesn't want to take pills, but also getting constipated from the oxy when he takes it. Has had two bowel movements since surgery with the last one being Tuesday.  Says both have been black and tarry.  He is on eliquis.  I have recommended stopping the eliquis and reaching out to pcp today about the melena as he may need to be seen for ? GI bleed.  Recommended backing off pt/activity over the weekend as well as I think he is overdoing it.

## 2024-05-26 NOTE — Telephone Encounter (Signed)
 Annette from [facility] [employer] called states pt has TKA recently and that his pain is 10+, having sharp stabbing pains, and vomitting due to the he is having, They are not doing PT due to the pain. She also states that he is being non-compliant with his directions.  Annette's call back number is [phone].

## 2024-05-30 ENCOUNTER — Other Ambulatory Visit: Payer: Self-pay

## 2024-05-30 ENCOUNTER — Encounter: Payer: Self-pay | Admitting: Orthopaedic Surgery

## 2024-05-30 ENCOUNTER — Ambulatory Visit (INDEPENDENT_AMBULATORY_CARE_PROVIDER_SITE_OTHER): Admitting: Orthopaedic Surgery

## 2024-05-30 DIAGNOSIS — Z96651 Presence of right artificial knee joint: Secondary | ICD-10-CM

## 2024-05-30 MED ORDER — HYDROCODONE-ACETAMINOPHEN 5-325 MG PO TABS: 1.0000 | ORAL_TABLET | Freq: Every day | ORAL | 0 refills | Status: DC | PRN

## 2024-05-30 NOTE — Progress Notes (Signed)
 Post-Op Visit Note   Patient: Jesus Martin           Date of Birth: Dec 10, 1960           MRN: [id number] Visit Date: 05/30/2024 PCP: Benuel Braun, DO   Assessment & Plan:  Chief Complaint:  Chief Complaint  Patient presents with   Right Knee - Follow-up    Right TKA 05/15/2024   Visit Diagnoses:  1. Status post total right knee replacement     Plan: History of Present Illness Jesus Martin is a 63 year old male who presents for 2 week postop visit from right TKA.  He experiences significant pain and swelling in his leg, with pain radiating down the leg and described as severe. The leg is swollen with edema. Painkillers are ineffective, but he uses compression and elevation. He initially stopped wearing compression socks but has resumed his use.  He experiences frequent vomiting and heartburn, which he attributes to constipation from pain medication. He has had only one bowel movement since leaving the hospital and vomits even without eating. He takes gel caps daily and has started Miralax, which has provided some relief.  He has been largely inactive, staying on the couch for two weeks, which he believes has contributed to his gastrointestinal issues. Initially, he was active, walking with a walker, but has since reduced activity.  Exam of the right knee shows expected postoperative swelling and light bruising.  The soft tissues are diffusely tender due to swelling.  No neurovascular compromise.  Flexion to 90 degrees.  Surgical incision is healed.  There is no drainage or signs of infection.  Assessment and Plan Postoperative right knee pain and edema Postoperative pain and edema due to surgical swelling. Compression and elevation effective.  - Advise wearing compression socks for up to six weeks or use ACE bandages if socks are difficult to apply. - Continue with elevation and use of the continuous passive motion machine. - Remove sutures and apply white strips  across the incision; allow him to peel them off after one week. - Allow showering without covering the incision. - Encourage moderate activity, such as one trip up and down the street.  Postoperative nausea and vomiting Nausea and vomiting likely due to opioid-induced constipation and pain medication use. - Focus on managing constipation to alleviate nausea and vomiting. - Encourage the use of over-the-counter medications for pain management instead of opioids when possible.  Opioid-induced constipation Constipation due to pain medication, leading to infrequent bowel movements and contributing to nausea and vomiting. Inactivity also a factor. - Decrease pain medication usage as much as possible. - Continue taking Miralax and gel caps daily. - Encourage increased physical activity to stimulate bowel function. - Increase dietary fiber and fluid intake, including prune juice.  Follow-Up Instructions: Return in about 4 weeks (around 06/27/2024) for with lindsey.   Orders:  No orders of the defined types were placed in this encounter.  Meds ordered this encounter  Medications   HYDROcodone -acetaminophen  (NORCO/VICODIN) 5-325 MG tablet    Sig: Take 1-2 tablets by mouth daily as needed for moderate pain (pain score 4-6).    Dispense:  30 tablet    Refill:  0    Imaging: No results found.  PMFS History: Patient Active Problem List   Diagnosis Date Noted   Status post total right knee replacement 05/15/2024   Pain of left scapula 03/07/2024   COPD exacerbation (HCC) 10/04/2023   Primary osteoarthritis of right knee 10/04/2023  Right knee pain 09/02/2023   Arthritis 05/18/2023   Encounter for screening colonoscopy 12/23/2022   Perforation of tympanic membrane, left 09/28/2022   Need for shingles vaccine 09/08/2022   Otitis media follow-up, not resolved, left 09/01/2022   Otitis externa 09/01/2022   Impetigo 09/01/2022   Urinary frequency 06/10/2022   Memory loss 02/05/2022    Allergic rhinitis 12/09/2021   Cervical spondylosis 02/27/2021   Rash and nonspecific skin eruption 07/10/2020   Hyperthyroidism 04/25/2020   Tobacco use 04/16/2020   Multiple pulmonary nodules 04/16/2020   Compression fracture of spine (HCC) 01/30/2019   Hyperlipidemia 09/26/2018   Foot pain, bilateral 09/26/2018   Diabetic neuropathy (HCC) 12/22/2017   Chronic pain 11/24/2017   Hepatic steatosis 11/24/2017   COPD (chronic obstructive pulmonary disease) (HCC) 11/24/2017   Diabetes (HCC)    Bipolar 1 disorder (HCC) 04/17/2011   Hypertension 04/17/2011   Involuntary weight loss 04/17/2011   Past Medical History:  Diagnosis Date   Anxiety    Arthritis    Asthma    childhood asthma. no an issues as an adult   Bipolar I disorder [facility]) 1991   Dr. Vincente   Borderline hyperlipidemia    history of   COPD (chronic obstructive pulmonary disease) (HCC)    Diabetes mellitus, new onset (HCC) 12/22/2017   Fibromyalgia    GERD (gastroesophageal reflux disease)    History of kidney stones    HTN (hypertension)    Hyperthyroidism    Lumbar vertebral fracture (HCC) 01/29/2019   L1   Pneumonia    Tobacco abuse    Viral conjunctivitis of both eyes 08/14/2022   Onset 2 days ago. Eyes are itchy and painful. Pt having tearing of both eyes.    Vitamin D  deficiency     Family History  Adopted: Yes    Past Surgical History:  Procedure Laterality Date   APPENDECTOMY     1975   BACK SURGERY     1998 - lumbar spine   CATARACT EXTRACTION Bilateral    COLONOSCOPY     INGUINAL HERNIA REPAIR Bilateral    as a child   NOSE SURGERY     as a child   TONSILLECTOMY     TOTAL KNEE ARTHROPLASTY Right 05/15/2024   Procedure: RIGHT TOTAL KNEE ARTHROPLASTY;  Surgeon: Jerri Kay HERO, MD;  Location: [facility] OR;  Service: Orthopedics;  Laterality: Right;   UMBILICAL HERNIA REPAIR     as a child   Social History   Occupational History   Occupation: disabled  Tobacco Use   Smoking status: Former     Current packs/day: 0.00    Average packs/day: 0.8 packs/day for 45.0 years (33.8 ttl pk-yrs)    Types: Cigarettes    Quit date: 10/01/2023    Years since quitting: 0.6   Smokeless tobacco: Current    Types: Snuff   Tobacco comments:    3/4 pack smoked daily ARJ 12/09/21    Patient and wife quit smoking 10/01/2023.  Vaping Use   Vaping status: Never Used  Substance and Sexual Activity   Alcohol use: Not Currently    Comment: twice per year   Drug use: No   Sexual activity: Yes

## 2024-06-07 ENCOUNTER — Other Ambulatory Visit: Payer: Self-pay | Admitting: Physician Assistant

## 2024-06-07 ENCOUNTER — Ambulatory Visit: Attending: Orthopaedic Surgery

## 2024-06-07 ENCOUNTER — Telehealth: Payer: Self-pay | Admitting: Orthopaedic Surgery

## 2024-06-07 DIAGNOSIS — M25561 Pain in right knee: Secondary | ICD-10-CM | POA: Diagnosis present

## 2024-06-07 DIAGNOSIS — M25562 Pain in left knee: Secondary | ICD-10-CM

## 2024-06-07 DIAGNOSIS — Z96651 Presence of right artificial knee joint: Secondary | ICD-10-CM | POA: Diagnosis not present

## 2024-06-07 DIAGNOSIS — R262 Difficulty in walking, not elsewhere classified: Secondary | ICD-10-CM | POA: Insufficient documentation

## 2024-06-07 DIAGNOSIS — M25661 Stiffness of right knee, not elsewhere classified: Secondary | ICD-10-CM | POA: Diagnosis present

## 2024-06-07 DIAGNOSIS — M6281 Muscle weakness (generalized): Secondary | ICD-10-CM | POA: Diagnosis present

## 2024-06-07 MED ORDER — HYDROCODONE-ACETAMINOPHEN 5-325 MG PO TABS: 1.0000 | ORAL_TABLET | Freq: Every day | ORAL | 0 refills | Status: DC | PRN

## 2024-06-07 NOTE — Telephone Encounter (Signed)
 Sent, but several days too early so they may not fill or insurance may not cover

## 2024-06-07 NOTE — Telephone Encounter (Signed)
 Patient called and needs a refill on pain medication. CB#[phone]

## 2024-06-07 NOTE — Therapy (Signed)
 OUTPATIENT PHYSICAL THERAPY LOWER EXTREMITY EVALUATION   Patient Name: Jesus Martin MRN: [id number] DOB:1961/07/07, 63 y.o., male Today's Date: 06/08/2024  END OF SESSION:  PT End of Session - 06/07/24 1121     Visit Number 1    Number of Visits 17    Date for Recertification  08/11/24    Authorization Type [employer]    Authorization - Visit Number 1    Authorization - Number of Visits 17    Progress Note Due on Visit 10    PT Start Time 1110    PT Stop Time 1200    PT Time Calculation (min) 50 min    Activity Tolerance Patient tolerated treatment well    Behavior During Therapy WFL for tasks assessed/performed          Past Medical History:  Diagnosis Date   Anxiety    Arthritis    Asthma    childhood asthma. no an issues as an adult   Bipolar I disorder [facility]) 1991   Dr. Vincente   Borderline hyperlipidemia    history of   COPD (chronic obstructive pulmonary disease) (HCC)    Diabetes mellitus, new onset (HCC) 12/22/2017   Fibromyalgia    GERD (gastroesophageal reflux disease)    History of kidney stones    HTN (hypertension)    Hyperthyroidism    Lumbar vertebral fracture (HCC) 01/29/2019   L1   Pneumonia    Tobacco abuse    Viral conjunctivitis of both eyes 08/14/2022   Onset 2 days ago. Eyes are itchy and painful. Pt having tearing of both eyes.    Vitamin D  deficiency    Past Surgical History:  Procedure Laterality Date   APPENDECTOMY     1975   BACK SURGERY     1998 - lumbar spine   CATARACT EXTRACTION Bilateral    COLONOSCOPY     INGUINAL HERNIA REPAIR Bilateral    as a child   NOSE SURGERY     as a child   TONSILLECTOMY     TOTAL KNEE ARTHROPLASTY Right 05/15/2024   Procedure: RIGHT TOTAL KNEE ARTHROPLASTY;  Surgeon: Jerri Kay HERO, MD;  Location: [facility] OR;  Service: Orthopedics;  Laterality: Right;   UMBILICAL HERNIA REPAIR     as a child   Patient Active Problem List   Diagnosis Date Noted   Status post total right  knee replacement 05/15/2024   Pain of left scapula 03/07/2024   COPD exacerbation (HCC) 10/04/2023   Primary osteoarthritis of right knee 10/04/2023   Right knee pain 09/02/2023   Arthritis 05/18/2023   Encounter for screening colonoscopy 12/23/2022   Perforation of tympanic membrane, left 09/28/2022   Need for shingles vaccine 09/08/2022   Otitis media follow-up, not resolved, left 09/01/2022   Otitis externa 09/01/2022   Impetigo 09/01/2022   Urinary frequency 06/10/2022   Memory loss 02/05/2022   Allergic rhinitis 12/09/2021   Cervical spondylosis 02/27/2021   Rash and nonspecific skin eruption 07/10/2020   Hyperthyroidism 04/25/2020   Tobacco use 04/16/2020   Multiple pulmonary nodules 04/16/2020   Compression fracture of spine (HCC) 01/30/2019   Hyperlipidemia 09/26/2018   Foot pain, bilateral 09/26/2018   Diabetic neuropathy (HCC) 12/22/2017   Chronic pain 11/24/2017   Hepatic steatosis 11/24/2017   COPD (chronic obstructive pulmonary disease) (HCC) 11/24/2017   Diabetes (HCC)    Bipolar 1 disorder (HCC) 04/17/2011   Hypertension 04/17/2011   Involuntary weight loss 04/17/2011    PCP: Benuel Braun,  DO   REFERRING PROVIDER: Jerri Kay HERO, MD   REFERRING DIAG: [phone] (ICD-10-CM) - Status post total right knee replacement   THERAPY DIAG:  Acute pain of right knee  Stiffness of right knee, not elsewhere classified  Muscle weakness (generalized)  Difficulty in walking, not elsewhere classified  Rationale for Evaluation and Treatment: Rehabilitation  ONSET DATE: 05/15/24  SUBJECTIVE:   SUBJECTIVE STATEMENT: Pt reports because of pain and swelling, the HHPT he received was limited. Pt notes consistent use of ice machine to manage pain and swelling.  PERTINENT HISTORY: COPD, HTN, DM  PAIN:  Are you having pain? Yes: NPRS scale: 7/10 Pain location: R knee Pain description: ache, throb Aggravating factors: knee extension, sleeping Relieving factors:  Ice machine, rest  PRECAUTIONS: Knee  RED FLAGS: None   WEIGHT BEARING RESTRICTIONS: No  FALLS:  Has patient fallen in last 6 months? No  LIVING ENVIRONMENT: Lives with: lives with their family Lives in: House/apartment Stairs: No ramp Has following equipment at home: Single point cane and Environmental Consultant - 2 wheeled  OCCUPATION: Disability  PLOF: Independent  PATIENT GOALS: Less pain, better use, and to go camping/hiking  NEXT MD VISIT: 06/27/24  OBJECTIVE:  Note: Objective measures were completed at Evaluation unless otherwise noted.  PATIENT SURVEYS:  LEFS: 6/80=8%   COGNITION: Overall cognitive status: Within functional limits for tasks assessed     SENSATION: WFL- sometimes N/T  EDEMA:  Swelling present   POSTURE: flexed trunk   PALPATION: TTP to the R peri-knee area  LOWER EXTREMITY ROM:  Active ROM Right eval Left eval  Hip flexion    Hip extension    Hip abduction    Hip adduction    Hip internal rotation    Hip external rotation    Knee flexion 110   Knee extension 17 lacking   Ankle dorsiflexion    Ankle plantarflexion    Ankle inversion    Ankle eversion     (Blank rows = not tested)  LOWER EXTREMITY MMT:  MMT Right eval Left eval  Hip flexion 3-   Hip extension    Hip abduction 3   Hip adduction    Hip internal rotation    Hip external rotation 3   Knee flexion 3   Knee extension 3   Ankle dorsiflexion    Ankle plantarflexion    Ankle inversion    Ankle eversion     (Blank rows = not tested)   FUNCTIONAL TESTS:  5 times sit to stand: TBA when pt is walking c a SPC 2 minute walk test: TBA when pt is walking c a SPC  GAIT: Distance walked: 150' Assistive device utilized: Environmental Consultant - 2 wheeled Level of assistance: Modified independence Comments: WNLs for use of RW                                                                                                                    TREATMENT DATE:   [facility] Adult PT Treatment:  DATE: 06/07/24 Therapeutic Exercise: Developed, instructed in, and pt completed therex as noted in HEP  Self Care: Use of blue foam for R knee ext ROM   PATIENT EDUCATION:  Education details: Eval findings, POC, HEP, self care  Person educated: Patient Education method: Explanation, Demonstration, Tactile cues, Verbal cues, and Handouts Education comprehension: verbalized understanding, returned demonstration, verbal cues required, and tactile cues required  HOME EXERCISE PROGRAM: Access Code: [email or web address] URL: [email or web address] Date: 06/07/2024 Prepared by: Dasie Daft  Exercises - Supine Quad Set  - 2 x daily - 7 x weekly - 1 sets - 10 reps - 5 hold - Active Straight Leg Raise with Quad Set  - 2 x daily - 7 x weekly - 1 sets - 10 reps - 5 hold - Supine Heel Slide  - 2 x daily - 7 x weekly - 1 sets - 10 reps - 3 hold - Sidelying Hip Abduction  - 2 x daily - 7 x weekly - 1 sets - 10 reps - Seated Long Arc Quad  - 2 x daily - 7 x weekly - 1 sets - 10 reps  ASSESSMENT:  CLINICAL IMPRESSION: Patient is a 63 y.o. male who was seen today for physical therapy evaluation and treatment for Z96.651 (ICD-10-CM) - Status post total right knee replacement . Pt presents with good knee flexion AROM, poor knee ext AROM, and R LE strength as anticipated. Pt will benefit from skilled PT 2w8 to address impairments to optimize R knee function with less pain.   OBJECTIVE IMPAIRMENTS: decreased activity tolerance, decreased balance, difficulty walking, decreased ROM, decreased strength, increased edema, and pain.   ACTIVITY LIMITATIONS: carrying, lifting, bending, standing, squatting, sleeping, stairs, transfers, bathing, toileting, dressing, and caring for others  PARTICIPATION LIMITATIONS: meal prep, cleaning, laundry, driving, shopping, community activity, and camping  PERSONAL FACTORS: Fitness, Past/current experiences, Time since onset of  injury/illness/exacerbation, and 3+ comorbidities: COPD, HTN, DM are also affecting patient's functional outcome.   REHAB POTENTIAL: Good  CLINICAL DECISION MAKING: Evolving/moderate complexity  EVALUATION COMPLEXITY: Moderate   GOALS:  SHORT TERM GOALS: Target date: 06/30/24 Pt will be Ind in an initial HEP  Baseline: started Goal status: INITIAL  2.  Increase R knee ext AROM to 10d or less for progress toward function knee ext with walking Baseline: 17 lacking Goal status: INITIAL  3.  Assess 5xSTS and when pt is walking with a SPC Baseline: TBA Goal status: INITIAL  LONG TERM GOALS: Target date: 08/11/24  Pt will be Ind in a final HEP to maintain achieved LOF  Baseline: started Goal status: INITIAL  2.  Increase R knee AROM to 5-120d for needed mobility with daily activities and ambulation Baseline: 17/110 Goal status: INITIAL  3.  Increase knee and hip strength to 4+ and 4 or greater respectively for appropriate function with daily activities and ambulation Baseline:  Goal status: INITIAL  4.  Improve 5xSTS by MCID of 5 and by MCID of 35ft as indication of improved functional mobility  Baseline: TBA Goal status: INITIAL  5.  Pt will be able to walk 500' and asc/dsc 12 steps c HR Indly for community mobility Baseline:  Goal status: INITIAL  6.  Pt's LEFS score will improve to 50% or greater as indication of improved function  Baseline: 8% Goal status: INITIAL   PLAN:  PT FREQUENCY: 2x/week  PT DURATION: 8 weeks  PLANNED INTERVENTIONS: [id number]- PT Re-evaluation, 97110-Therapeutic exercises, 97530- Therapeutic activity, [id number]- Neuromuscular re-education, 97535- Self Care, [id number]-  Manual therapy, [id number]- Gait training, [phone]- Electrical stimulation (unattended), Patient/Family education, Balance training, Stair training, Taping, Joint mobilization, Cryotherapy, and Moist heat  PLAN FOR NEXT SESSION: Assess response to HEP; progress therex as indicated;  use of modalities, manual therapy as indicated.    Marine Lezotte MS, PT 06/08/24 6:14 PM

## 2024-06-08 ENCOUNTER — Other Ambulatory Visit: Payer: Self-pay

## 2024-06-12 ENCOUNTER — Encounter: Payer: Self-pay | Admitting: Radiology

## 2024-06-13 NOTE — Therapy (Signed)
 OUTPATIENT PHYSICAL THERAPY LOWER EXTREMITY TREATMENT   Patient Name: Jesus Martin MRN: [id number] DOB:[birth year], 63 y.o., male Today's Date: 06/14/2024  END OF SESSION:  PT End of Session - 06/14/24 1332     Visit Number 2    Number of Visits 17    Date for Recertification  08/11/24    Authorization Type [employer]TE    Authorization - Visit Number 2    Authorization - Number of Visits 17    Progress Note Due on Visit 10    PT Start Time 1330    PT Stop Time 1412    PT Time Calculation (min) 42 min    Activity Tolerance Patient tolerated treatment well    Behavior During Therapy WFL for tasks assessed/performed           Past Medical History:  Diagnosis Date   Anxiety    Arthritis    Asthma    childhood asthma. no an issues as an adult   Bipolar I disorder [facility]) 1991   Dr. Vincente   Borderline hyperlipidemia    history of   COPD (chronic obstructive pulmonary disease) (HCC)    Diabetes mellitus, new onset (HCC) 12/22/2017   Fibromyalgia    GERD (gastroesophageal reflux disease)    History of kidney stones    HTN (hypertension)    Hyperthyroidism    Lumbar vertebral fracture (HCC) 01/29/2019   L1   Pneumonia    Tobacco abuse    Viral conjunctivitis of both eyes 08/14/2022   Onset 2 days ago. Eyes are itchy and painful. Pt having tearing of both eyes.    Vitamin D  deficiency    Past Surgical History:  Procedure Laterality Date   APPENDECTOMY     1975   BACK SURGERY     1998 - lumbar spine   CATARACT EXTRACTION Bilateral    COLONOSCOPY     INGUINAL HERNIA REPAIR Bilateral    as a child   NOSE SURGERY     as a child   TONSILLECTOMY     TOTAL KNEE ARTHROPLASTY Right 05/15/2024   Procedure: RIGHT TOTAL KNEE ARTHROPLASTY;  Surgeon: Jerri Kay HERO, MD;  Location: [facility] OR;  Service: Orthopedics;  Laterality: Right;   UMBILICAL HERNIA REPAIR     as a child   Patient Active Problem List   Diagnosis Date Noted   Status post total right  knee replacement 05/15/2024   Pain of left scapula 03/07/2024   COPD exacerbation (HCC) 10/04/2023   Primary osteoarthritis of right knee 10/04/2023   Right knee pain 09/02/2023   Arthritis 05/18/2023   Encounter for screening colonoscopy 12/23/2022   Perforation of tympanic membrane, left 09/28/2022   Need for shingles vaccine 09/08/2022   Otitis media follow-up, not resolved, left 09/01/2022   Otitis externa 09/01/2022   Impetigo 09/01/2022   Urinary frequency 06/10/2022   Memory loss 02/05/2022   Allergic rhinitis 12/09/2021   Cervical spondylosis 02/27/2021   Rash and nonspecific skin eruption 07/10/2020   Hyperthyroidism 04/25/2020   Tobacco use 04/16/2020   Multiple pulmonary nodules 04/16/2020   Compression fracture of spine (HCC) 01/30/2019   Hyperlipidemia 09/26/2018   Foot pain, bilateral 09/26/2018   Diabetic neuropathy (HCC) 12/22/2017   Chronic pain 11/24/2017   Hepatic steatosis 11/24/2017   COPD (chronic obstructive pulmonary disease) (HCC) 11/24/2017   Diabetes (HCC)    Bipolar 1 disorder (HCC) 04/17/2011   Hypertension 04/17/2011   Involuntary weight loss 04/17/2011    PCP: Benuel,  [place], DO   REFERRING PROVIDER: Jerri Kay HERO, MD   REFERRING DIAG: [phone] (ICD-10-CM) - Status post total right knee replacement   THERAPY DIAG:  Acute pain of right knee  Stiffness of right knee, not elsewhere classified  Muscle weakness (generalized)  Difficulty in walking, not elsewhere classified  Rationale for Evaluation and Treatment: Rehabilitation  ONSET DATE: 05/15/24  SUBJECTIVE:   SUBJECTIVE STATEMENT: Pt reports his R knee is doing a little better. Still usint the RW because he does not feel the R leg will support him. Pt reports consistent completion of HEP and use of ext foam. Pt staes he is still using the RW due to pain and the R leg feels like it is going to give out on him.  EVAL: Pt reports because of pain and swelling, the HHPT he received  was limited. Pt notes consistent use of ice machine to manage pain and swelling.  PERTINENT HISTORY: COPD, HTN, DM  PAIN:  Are you having pain? Yes: NPRS scale: 6/10 Pain location: R knee Pain description: ache, throb Aggravating factors: knee extension, sleeping Relieving factors: Ice machine, rest  PRECAUTIONS: Knee  RED FLAGS: None   WEIGHT BEARING RESTRICTIONS: No  FALLS:  Has patient fallen in last 6 months? No  LIVING ENVIRONMENT: Lives with: lives with their family Lives in: House/apartment Stairs: No ramp Has following equipment at home: Single point cane and Environmental Consultant - 2 wheeled  OCCUPATION: Disability  PLOF: Independent  PATIENT GOALS: Less pain, better use, and to go camping/hiking  NEXT MD VISIT: 06/27/24  OBJECTIVE:  Note: Objective measures were completed at Evaluation unless otherwise noted.  PATIENT SURVEYS:  LEFS: 6/80=8%   COGNITION: Overall cognitive status: Within functional limits for tasks assessed     SENSATION: WFL- sometimes N/T  EDEMA:  Swelling present   POSTURE: flexed trunk   PALPATION: TTP to the R peri-knee area  LOWER EXTREMITY ROM:  Active ROM Right eval Left eval Rt 06/14/24  Hip flexion     Hip extension     Hip abduction     Hip adduction     Hip internal rotation     Hip external rotation     Knee flexion 110    Knee extension 17 lacking  10 lacking  Ankle dorsiflexion     Ankle plantarflexion     Ankle inversion     Ankle eversion      (Blank rows = not tested)  LOWER EXTREMITY MMT:  MMT Right eval Left eval  Hip flexion 3-   Hip extension    Hip abduction 3   Hip adduction    Hip internal rotation    Hip external rotation 3   Knee flexion 3   Knee extension 3   Ankle dorsiflexion    Ankle plantarflexion    Ankle inversion    Ankle eversion     (Blank rows = not tested)   FUNCTIONAL TESTS:  5 times sit to stand: TBA when pt is walking c a SPC 2 minute walk test: TBA when pt is walking  c a SPC  GAIT: Distance walked: 150' Assistive device utilized: Environmental Consultant - 2 wheeled Level of assistance: Modified independence Comments: WNLs for use of RW  TREATMENT DATE:   [facility] Adult PT Treatment:                                                DATE: 06/14/24 Therapeutic Exercise/Activities: Nustep 5 mins L5 UE/LE Standing TKE 2x12 GTB LAQ 2x12 3# Seated QS on stool x10 5 Seated knee ext/hamstring stretch x3 30 Straight Leg Raise with Quad Set 2x10 Bridging 2x10 Self Care: Pt is going to use a cold pack when he gets home  [facility] Adult PT Treatment:                                                DATE: 06/07/24 Therapeutic Exercise: Developed, instructed in, and pt completed therex as noted in HEP  Self Care: Use of blue foam for R knee ext ROM   PATIENT EDUCATION:  Education details: Eval findings, POC, HEP, self care  Person educated: Patient Education method: Explanation, Demonstration, Tactile cues, Verbal cues, and Handouts Education comprehension: verbalized understanding, returned demonstration, verbal cues required, and tactile cues required  HOME EXERCISE PROGRAM: Access Code: [email or web address] URL: [email or web address] Date: 06/14/2024 Prepared by: Dasie Daft  Exercises - Supine Quad Set  - 2 x daily - 7 x weekly - 1 sets - 10 reps - 5 hold - Active Straight Leg Raise with Quad Set  - 2 x daily - 7 x weekly - 1 sets - 10 reps - 5 hold - Supine Heel Slide  - 2 x daily - 7 x weekly - 1 sets - 10 reps - 3 hold - Sidelying Hip Abduction  - 2 x daily - 7 x weekly - 1 sets - 10 reps - Seated Long Arc Quad  - 2 x daily - 7 x weekly - 1 sets - 10 reps - Seated Knee Extension Stretch with Chair  - 2 x daily - 7 x weekly - 1 sets - 3-5 reps - 30 hold - Supine Bridge  - 2 x daily - 7 x weekly - 2 sets - 10 reps - 3 hold  ASSESSMENT:  CLINICAL  IMPRESSION: PT was completed for R knee ROM and R LE strengthening. R knee AROM for ext has made good progress to 10d lacking. Pt needs use of RW for ambulation due to both pain and weakness. Pt tolerated prescribed exs in PT today without adverse effects   EVAL: Patient is a 63 y.o. male who was seen today for physical therapy evaluation and treatment for Z96.651 (ICD-10-CM) - Status post total right knee replacement . Pt presents with good knee flexion AROM, poor knee ext AROM, and R LE strength as anticipated. Pt will benefit from skilled PT 2w8 to address impairments to optimize R knee function with less pain.   OBJECTIVE IMPAIRMENTS: decreased activity tolerance, decreased balance, difficulty walking, decreased ROM, decreased strength, increased edema, and pain.   ACTIVITY LIMITATIONS: carrying, lifting, bending, standing, squatting, sleeping, stairs, transfers, bathing, toileting, dressing, and caring for others  PARTICIPATION LIMITATIONS: meal prep, cleaning, laundry, driving, shopping, community activity, and camping  PERSONAL FACTORS: Fitness, Past/current experiences, Time since onset of injury/illness/exacerbation, and 3+ comorbidities: COPD, HTN, DM are also affecting patient's functional outcome.   REHAB POTENTIAL: Good  CLINICAL DECISION MAKING: Evolving/moderate complexity  EVALUATION COMPLEXITY: Moderate   GOALS:  SHORT TERM GOALS: Target date: 06/30/24 Pt will be Ind in an initial HEP  Baseline: started Goal status: INITIAL  2.  Increase R knee ext AROM to 10d or less for progress toward function knee ext with walking Baseline: 17 lacking Goal status: INITIAL  3.  Assess 5xSTS and when pt is walking with a SPC Baseline: TBA Goal status: INITIAL  LONG TERM GOALS: Target date: 08/11/24  Pt will be Ind in a final HEP to maintain achieved LOF  Baseline: started Goal status: INITIAL  2.  Increase R knee AROM to 5-120d for needed mobility with daily activities  and ambulation Baseline: 17/110 Goal status: INITIAL  3.  Increase knee and hip strength to 4+ and 4 or greater respectively for appropriate function with daily activities and ambulation Baseline:  Goal status: INITIAL  4.  Improve 5xSTS by MCID of 5 and by MCID of 14ft as indication of improved functional mobility  Baseline: TBA Goal status: INITIAL  5.  Pt will be able to walk 500' and asc/dsc 12 steps c HR Indly for community mobility Baseline:  Goal status: INITIAL  6.  Pt's LEFS score will improve to 50% or greater as indication of improved function  Baseline: 8% Goal status: INITIAL   PLAN:  PT FREQUENCY: 2x/week  PT DURATION: 8 weeks  PLANNED INTERVENTIONS: 97164- PT Re-evaluation, 97110-Therapeutic exercises, 97530- Therapeutic activity, 97112- Neuromuscular re-education, 97535- Self Care, 02859- Manual therapy, [phone]- Gait training, [phone]- Electrical stimulation (unattended), Patient/Family education, Balance training, Stair training, Taping, Joint mobilization, Cryotherapy, and Moist heat  PLAN FOR NEXT SESSION: Assess response to HEP; progress therex as indicated; use of modalities, manual therapy as indicated.    Willman Cuny MS, PT 06/14/24 2:16 PM

## 2024-06-14 ENCOUNTER — Ambulatory Visit: Attending: Orthopaedic Surgery

## 2024-06-14 DIAGNOSIS — M25561 Pain in right knee: Secondary | ICD-10-CM | POA: Insufficient documentation

## 2024-06-14 DIAGNOSIS — M6281 Muscle weakness (generalized): Secondary | ICD-10-CM | POA: Insufficient documentation

## 2024-06-14 DIAGNOSIS — R262 Difficulty in walking, not elsewhere classified: Secondary | ICD-10-CM | POA: Diagnosis present

## 2024-06-14 DIAGNOSIS — M25661 Stiffness of right knee, not elsewhere classified: Secondary | ICD-10-CM | POA: Insufficient documentation

## 2024-06-16 ENCOUNTER — Ambulatory Visit

## 2024-06-16 DIAGNOSIS — R262 Difficulty in walking, not elsewhere classified: Secondary | ICD-10-CM

## 2024-06-16 DIAGNOSIS — M25661 Stiffness of right knee, not elsewhere classified: Secondary | ICD-10-CM

## 2024-06-16 DIAGNOSIS — M6281 Muscle weakness (generalized): Secondary | ICD-10-CM

## 2024-06-16 DIAGNOSIS — M25561 Pain in right knee: Secondary | ICD-10-CM | POA: Diagnosis not present

## 2024-06-16 NOTE — Therapy (Signed)
 OUTPATIENT PHYSICAL THERAPY LOWER EXTREMITY TREATMENT   Patient Name: Jesus Martin MRN: [id number] DOB:05/22/1961, 63 y.o., male Today's Date: 06/16/2024  END OF SESSION:  PT End of Session - 06/16/24 1155     Visit Number 3    Number of Visits 17    Date for Recertification  08/11/24    Authorization Type [employer]PLETE    Authorization - Visit Number 3    Authorization - Number of Visits 17    Progress Note Due on Visit 10    PT Start Time 1147    PT Stop Time 1230    PT Time Calculation (min) 43 min    Activity Tolerance Patient tolerated treatment well    Behavior During Therapy WFL for tasks assessed/performed            Past Medical History:  Diagnosis Date   Anxiety    Arthritis    Asthma    childhood asthma. no an issues as an adult   Bipolar I disorder [facility]) 1991   Dr. Vincente   Borderline hyperlipidemia    history of   COPD (chronic obstructive pulmonary disease) (HCC)    Diabetes mellitus, new onset (HCC) 12/22/2017   Fibromyalgia    GERD (gastroesophageal reflux disease)    History of kidney stones    HTN (hypertension)    Hyperthyroidism    Lumbar vertebral fracture (HCC) 01/29/2019   L1   Pneumonia    Tobacco abuse    Viral conjunctivitis of both eyes 08/14/2022   Onset 2 days ago. Eyes are itchy and painful. Pt having tearing of both eyes.    Vitamin D  deficiency    Past Surgical History:  Procedure Laterality Date   APPENDECTOMY     1975   BACK SURGERY     1998 - lumbar spine   CATARACT EXTRACTION Bilateral    COLONOSCOPY     INGUINAL HERNIA REPAIR Bilateral    as a child   NOSE SURGERY     as a child   TONSILLECTOMY     TOTAL KNEE ARTHROPLASTY Right 05/15/2024   Procedure: RIGHT TOTAL KNEE ARTHROPLASTY;  Surgeon: Jerri Kay HERO, MD;  Location: [facility] OR;  Service: Orthopedics;  Laterality: Right;   UMBILICAL HERNIA REPAIR     as a child   Patient Active Problem List   Diagnosis Date Noted   Status post total right  knee replacement 05/15/2024   Pain of left scapula 03/07/2024   COPD exacerbation (HCC) 10/04/2023   Primary osteoarthritis of right knee 10/04/2023   Right knee pain 09/02/2023   Arthritis 05/18/2023   Encounter for screening colonoscopy 12/23/2022   Perforation of tympanic membrane, left 09/28/2022   Need for shingles vaccine 09/08/2022   Otitis media follow-up, not resolved, left 09/01/2022   Otitis externa 09/01/2022   Impetigo 09/01/2022   Urinary frequency 06/10/2022   Memory loss 02/05/2022   Allergic rhinitis 12/09/2021   Cervical spondylosis 02/27/2021   Rash and nonspecific skin eruption 07/10/2020   Hyperthyroidism 04/25/2020   Tobacco use 04/16/2020   Multiple pulmonary nodules 04/16/2020   Compression fracture of spine (HCC) 01/30/2019   Hyperlipidemia 09/26/2018   Foot pain, bilateral 09/26/2018   Diabetic neuropathy (HCC) 12/22/2017   Chronic pain 11/24/2017   Hepatic steatosis 11/24/2017   COPD (chronic obstructive pulmonary disease) (HCC) 11/24/2017   Diabetes (HCC)    Bipolar 1 disorder (HCC) 04/17/2011   Hypertension 04/17/2011   Involuntary weight loss 04/17/2011    PCP:  Rihner, [place], DO   REFERRING PROVIDER: Jerri Kay HERO, MD   REFERRING DIAG: [phone] (ICD-10-CM) - Status post total right knee replacement   THERAPY DIAG:  Acute pain of right knee  Stiffness of right knee, not elsewhere classified  Muscle weakness (generalized)  Difficulty in walking, not elsewhere classified  Rationale for Evaluation and Treatment: Rehabilitation  ONSET DATE: 05/15/24  SUBJECTIVE:   SUBJECTIVE STATEMENT: My strength needs to improve. In standing, it feels like my knee is going to out. I use a RW outside the home and a SPC in the home due to narrow pathways.  EVAL: Pt reports because of pain and swelling, the HHPT he received was limited. Pt notes consistent use of ice machine to manage pain and swelling.  PERTINENT HISTORY: COPD, HTN, DM  PAIN:   Are you having pain? Yes: NPRS scale: 7/10 Pain location: R knee Pain description: ache, throb Aggravating factors: knee extension, sleeping Relieving factors: Ice machine, rest  PRECAUTIONS: Knee  RED FLAGS: None   WEIGHT BEARING RESTRICTIONS: No  FALLS:  Has patient fallen in last 6 months? No  LIVING ENVIRONMENT: Lives with: lives with their family Lives in: House/apartment Stairs: No ramp Has following equipment at home: Single point cane and Environmental Consultant - 2 wheeled  OCCUPATION: Disability  PLOF: Independent  PATIENT GOALS: Less pain, better use, and to go camping/hiking  NEXT MD VISIT: 06/27/24  OBJECTIVE:  Note: Objective measures were completed at Evaluation unless otherwise noted.  PATIENT SURVEYS:  LEFS: 6/80=8%   COGNITION: Overall cognitive status: Within functional limits for tasks assessed     SENSATION: WFL- sometimes N/T  EDEMA:  Swelling present   POSTURE: flexed trunk   PALPATION: TTP to the R peri-knee area  LOWER EXTREMITY ROM:  Active ROM Right eval Left eval Rt 06/14/24 Rt 06/16/24  Hip flexion      Hip extension      Hip abduction      Hip adduction      Hip internal rotation      Hip external rotation      Knee flexion 110   118  Knee extension 17 lacking  10 lacking 9 lacking  Ankle dorsiflexion      Ankle plantarflexion      Ankle inversion      Ankle eversion       (Blank rows = not tested)  LOWER EXTREMITY MMT:  MMT Right eval Left eval  Hip flexion 3-   Hip extension    Hip abduction 3   Hip adduction    Hip internal rotation    Hip external rotation 3   Knee flexion 3   Knee extension 3   Ankle dorsiflexion    Ankle plantarflexion    Ankle inversion    Ankle eversion     (Blank rows = not tested)   FUNCTIONAL TESTS:  5 times sit to stand: TBA when pt is walking c a SPC 2 minute walk test: TBA when pt is walking c a SPC  GAIT: Distance walked: 150' Assistive device utilized: Environmental Consultant - 2  wheeled Level of assistance: Modified independence Comments: WNLs for use of RW  TREATMENT DATE:   [facility] Adult PT Treatment:                                                DATE: 06/16/24  Therapeutic Exercise/Activities: Nustep 5 mins L5 UE/LE Heel slides x10 Quad sets x10 5 SLR c quad sets x10 3 Bridging 15x 3 LAQ 2x10 3# STS x10 c use of hands Manual Therapy: Quad STM massage c hands and roller PA Fem/Tib grade 3 mobs                                                                                                                    TREATMENT DATE:   [facility] Adult PT Treatment:                                                DATE: 06/14/24 Therapeutic Exercise/Activities: Nustep 5 mins L5 UE/LE Standing TKE 2x12 GTB LAQ 2x12 3# Seated QS on stool x10 5 Seated knee ext/hamstring stretch x3 30 Straight Leg Raise with Quad Set 2x10 Bridging 2x10 Self Care: Pt is going to use a cold pack when he gets home  PATIENT EDUCATION:  Education details: Eval findings, POC, HEP, self care  Person educated: Patient Education method: Explanation, Demonstration, Tactile cues, Verbal cues, and Handouts Education comprehension: verbalized understanding, returned demonstration, verbal cues required, and tactile cues required  HOME EXERCISE PROGRAM: Access Code: [email or web address] URL: [email or web address] Date: 06/16/2024 Prepared by: Dasie Daft  Exercises - Supine Quad Set  - 2 x daily - 7 x weekly - 1 sets - 10 reps - 5 hold - Active Straight Leg Raise with Quad Set  - 2 x daily - 7 x weekly - 1 sets - 10 reps - 5 hold - Supine Heel Slide  - 2 x daily - 7 x weekly - 1 sets - 10 reps - 3 hold - Sidelying Hip Abduction  - 2 x daily - 7 x weekly - 1 sets - 10 reps - Seated Long Arc Quad  - 2 x daily - 7 x weekly - 1 sets - 10 reps - Seated Knee Extension Stretch with Chair  - 2 x daily - 7 x weekly - 1 sets - 3-5 reps - 30 hold - Supine Bridge  - 2 x daily - 7 x weekly - 2 sets - 10  reps - 3 hold - Supine Hamstring Stretch with Strap  - 1 x daily - 7 x weekly - 1 sets - 3 reps - 30 hold - Sit to Stand Without Arm Support  - 1 x daily - 7 x weekly - 2 sets - 10 reps  ASSESSMENT:  CLINICAL IMPRESSION: STM was provided to address quad tightness and pain. PT was completed to address ROM and strength.  R knee flexion AROM has increased to 118d from 110d. Pt is making appropriate progress. Will assess the use of a SPC vs the RW for support in more open spaces the next PT session.  EVAL: Patient is a 63 y.o. male who was seen today for physical therapy evaluation and  treatment for Z96.651 (ICD-10-CM) - Status post total right knee replacement . Pt presents with good knee flexion AROM, poor knee ext AROM, and R LE strength as anticipated. Pt will benefit from skilled PT 2w8 to address impairments to optimize R knee function with less pain.   OBJECTIVE IMPAIRMENTS: decreased activity tolerance, decreased balance, difficulty walking, decreased ROM, decreased strength, increased edema, and pain.   ACTIVITY LIMITATIONS: carrying, lifting, bending, standing, squatting, sleeping, stairs, transfers, bathing, toileting, dressing, and caring for others  PARTICIPATION LIMITATIONS: meal prep, cleaning, laundry, driving, shopping, community activity, and camping  PERSONAL FACTORS: Fitness, Past/current experiences, Time since onset of injury/illness/exacerbation, and 3+ comorbidities: COPD, HTN, DM are also affecting patient's functional outcome.   REHAB POTENTIAL: Good  CLINICAL DECISION MAKING: Evolving/moderate complexity  EVALUATION COMPLEXITY: Moderate   GOALS:  SHORT TERM GOALS: Target date: 06/30/24 Pt will be Ind in an initial HEP  Baseline: started Goal status: INITIAL  2.  Increase R knee ext AROM to 10d or less for progress toward function knee ext with walking Baseline: 17 lacking Goal status: INITIAL  3.  Assess 5xSTS and when pt is walking with a  SPC Baseline: TBA Goal status: INITIAL  LONG TERM GOALS: Target date: 08/11/24  Pt will be Ind in a final HEP to maintain achieved LOF  Baseline: started Goal status: INITIAL  2.  Increase R knee AROM to 5-120d for needed mobility with daily activities and ambulation Baseline: 17/110 Goal status: INITIAL  3.  Increase knee and hip strength to 4+ and 4 or greater respectively for appropriate function with daily activities and ambulation Baseline:  Goal status: INITIAL  4.  Improve 5xSTS by MCID of 5 and by MCID of 20ft as indication of improved functional mobility  Baseline: TBA Goal status: INITIAL  5.  Pt will be able to walk 500' and asc/dsc 12 steps c HR Indly for community mobility Baseline:  Goal status: INITIAL  6.  Pt's LEFS score will improve to 50% or greater as indication of improved function  Baseline: 8% Goal status: INITIAL   PLAN:  PT FREQUENCY: 2x/week  PT DURATION: 8 weeks  PLANNED INTERVENTIONS: 97164- PT Re-evaluation, 97110-Therapeutic exercises, 97530- Therapeutic activity, 97112- Neuromuscular re-education, 97535- Self Care, 02859- Manual therapy, [phone]- Gait training, [phone]- Electrical stimulation (unattended), Patient/Family education, Balance training, Stair training, Taping, Joint mobilization, Cryotherapy, and Moist heat  PLAN FOR NEXT SESSION: Assess response to HEP; progress therex as indicated; use of modalities, manual therapy as indicated.    Veroncia Jezek MS, PT 06/16/24 3:03 PM

## 2024-06-19 NOTE — Therapy (Incomplete)
 OUTPATIENT PHYSICAL THERAPY LOWER EXTREMITY TREATMENT   Patient Name: Jesus Martin MRN: [id number] DOB:Dec 25, 1960, 63 y.o., male Today's Date: 06/19/2024  END OF SESSION:      Past Medical History:  Diagnosis Date   Anxiety    Arthritis    Asthma    childhood asthma. no an issues as an adult   Bipolar I disorder [facility]) 1991   Dr. Vincente   Borderline hyperlipidemia    history of   COPD (chronic obstructive pulmonary disease) (HCC)    Diabetes mellitus, new onset (HCC) 12/22/2017   Fibromyalgia    GERD (gastroesophageal reflux disease)    History of kidney stones    HTN (hypertension)    Hyperthyroidism    Lumbar vertebral fracture (HCC) 01/29/2019   L1   Pneumonia    Tobacco abuse    Viral conjunctivitis of both eyes 08/14/2022   Onset 2 days ago. Eyes are itchy and painful. Pt having tearing of both eyes.    Vitamin D  deficiency    Past Surgical History:  Procedure Laterality Date   APPENDECTOMY     1975   BACK SURGERY     1998 - lumbar spine   CATARACT EXTRACTION Bilateral    COLONOSCOPY     INGUINAL HERNIA REPAIR Bilateral    as a child   NOSE SURGERY     as a child   TONSILLECTOMY     TOTAL KNEE ARTHROPLASTY Right 05/15/2024   Procedure: RIGHT TOTAL KNEE ARTHROPLASTY;  Surgeon: Jerri Kay HERO, MD;  Location: [facility] OR;  Service: Orthopedics;  Laterality: Right;   UMBILICAL HERNIA REPAIR     as a child   Patient Active Problem List   Diagnosis Date Noted   Status post total right knee replacement 05/15/2024   Pain of left scapula 03/07/2024   COPD exacerbation (HCC) 10/04/2023   Primary osteoarthritis of right knee 10/04/2023   Right knee pain 09/02/2023   Arthritis 05/18/2023   Encounter for screening colonoscopy 12/23/2022   Perforation of tympanic membrane, left 09/28/2022   Need for shingles vaccine 09/08/2022   Otitis media follow-up, not resolved, left 09/01/2022   Otitis externa 09/01/2022   Impetigo 09/01/2022   Urinary frequency 06/10/2022    Memory loss 02/05/2022   Allergic rhinitis 12/09/2021   Cervical spondylosis 02/27/2021   Rash and nonspecific skin eruption 07/10/2020   Hyperthyroidism 04/25/2020   Tobacco use 04/16/2020   Multiple pulmonary nodules 04/16/2020   Compression fracture of spine (HCC) 01/30/2019   Hyperlipidemia 09/26/2018   Foot pain, bilateral 09/26/2018   Diabetic neuropathy (HCC) 12/22/2017   Chronic pain 11/24/2017   Hepatic steatosis 11/24/2017   COPD (chronic obstructive pulmonary disease) (HCC) 11/24/2017   Diabetes (HCC)    Bipolar 1 disorder (HCC) 04/17/2011   Hypertension 04/17/2011   Involuntary weight loss 04/17/2011    PCP: Benuel Braun, DO   REFERRING PROVIDER: Jerri Kay HERO, MD   REFERRING DIAG: [phone] (ICD-10-CM) - Status post total right knee replacement   THERAPY DIAG:  No diagnosis found.  Rationale for Evaluation and Treatment: Rehabilitation  ONSET DATE: 05/15/24  SUBJECTIVE:   SUBJECTIVE STATEMENT: My strength needs to improve. In standing, it feels like my knee is going to out. I use a RW outside the home and a SPC in the home due to narrow pathways.  EVAL: Pt reports because of pain and swelling, the HHPT he received was limited. Pt notes consistent use of ice machine to manage pain and swelling.  PERTINENT HISTORY:  COPD, HTN, DM  PAIN:  Are you having pain? Yes: NPRS scale: 7/10 Pain location: R knee Pain description: ache, throb Aggravating factors: knee extension, sleeping Relieving factors: Ice machine, rest  PRECAUTIONS: Knee  RED FLAGS: None   WEIGHT BEARING RESTRICTIONS: No  FALLS:  Has patient fallen in last 6 months? No  LIVING ENVIRONMENT: Lives with: lives with their family Lives in: House/apartment Stairs: No ramp Has following equipment at home: Single point cane and Environmental Consultant - 2 wheeled  OCCUPATION: Disability  PLOF: Independent  PATIENT GOALS: Less pain, better use, and to go camping/hiking  NEXT MD VISIT:  06/27/24  OBJECTIVE:  Note: Objective measures were completed at Evaluation unless otherwise noted.  PATIENT SURVEYS:  LEFS: 6/80=8%   COGNITION: Overall cognitive status: Within functional limits for tasks assessed     SENSATION: WFL- sometimes N/T  EDEMA:  Swelling present   POSTURE: flexed trunk   PALPATION: TTP to the R peri-knee area  LOWER EXTREMITY ROM:  Active ROM Right eval Left eval Rt 06/14/24 Rt 06/16/24  Hip flexion      Hip extension      Hip abduction      Hip adduction      Hip internal rotation      Hip external rotation      Knee flexion 110   118  Knee extension 17 lacking  10 lacking 9 lacking  Ankle dorsiflexion      Ankle plantarflexion      Ankle inversion      Ankle eversion       (Blank rows = not tested)  LOWER EXTREMITY MMT:  MMT Right eval Left eval  Hip flexion 3-   Hip extension    Hip abduction 3   Hip adduction    Hip internal rotation    Hip external rotation 3   Knee flexion 3   Knee extension 3   Ankle dorsiflexion    Ankle plantarflexion    Ankle inversion    Ankle eversion     (Blank rows = not tested)   FUNCTIONAL TESTS:  5 times sit to stand: TBA when pt is walking c a SPC 2 minute walk test: TBA when pt is walking c a SPC  GAIT: Distance walked: 150' Assistive device utilized: Environmental Consultant - 2 wheeled Level of assistance: Modified independence Comments: WNLs for use of RW  TREATMENT DATE:   [facility] Adult PT Treatment:                                                DATE: 06/20/24 Therapeutic Exercise/Activities: Nustep 5 mins L5 UE/LE Heel slides x10 Quad sets x10 5 SLR c quad sets x10 3 Bridging 15x 3 LAQ 2x10 3# STS x10 c use of hands Manual Therapy: Quad STM massage c hands and roller PA Fem/Tib grade 3 mobs Therapeutic Exercise: *** Manual Therapy: *** Neuromuscular re-ed: *** Therapeutic Activity: *** Modalities: *** Self Care: ***  [facility] Adult PT Treatment:                                                 DATE: 06/16/24 Therapeutic Exercise/Activities: Nustep 5 mins L5 UE/LE Heel slides x10 Quad sets x10 5 SLR c  quad sets x10 3 Bridging 15x 3 LAQ 2x10 3# STS x10 c use of hands Manual Therapy: Quad STM massage c hands and roller PA Fem/Tib grade 3 mobs                                                                                                                   PATIENT EDUCATION:  Education details: Eval findings, POC, HEP, self care  Person educated: Patient Education method: Explanation, Demonstration, Tactile cues, Verbal cues, and Handouts Education comprehension: verbalized understanding, returned demonstration, verbal cues required, and tactile cues required  HOME EXERCISE PROGRAM: Access Code: [email or web address] URL: [email or web address] Date: 06/16/2024 Prepared by: Dasie Daft  Exercises - Supine Quad Set  - 2 x daily - 7 x weekly - 1 sets - 10 reps - 5 hold - Active Straight Leg Raise with Quad Set  - 2 x daily - 7 x weekly - 1 sets - 10 reps - 5 hold - Supine Heel Slide  - 2 x daily - 7 x weekly - 1 sets - 10 reps - 3 hold - Sidelying Hip Abduction  - 2 x daily - 7 x weekly - 1 sets - 10 reps - Seated Long Arc Quad  - 2 x daily - 7 x weekly - 1 sets - 10 reps - Seated Knee Extension Stretch with Chair  - 2 x daily - 7 x weekly - 1 sets - 3-5 reps - 30 hold - Supine Bridge  - 2 x daily - 7 x weekly - 2 sets - 10 reps - 3 hold - Supine Hamstring Stretch with Strap  - 1 x daily - 7 x weekly - 1 sets - 3 reps - 30 hold - Sit to Stand Without Arm Support  - 1 x daily - 7 x weekly - 2 sets - 10 reps  ASSESSMENT:  CLINICAL IMPRESSION: STM was provided to address quad tightness and pain. PT was completed to address ROM and strength.  R knee flexion AROM has increased to 118d from 110d. Pt is making appropriate progress. Will assess the use of a SPC vs the RW for support in more open spaces the next PT session.  EVAL: Patient is a 63 y.o. male  who was seen today for physical therapy evaluation and treatment for Z96.651 (ICD-10-CM) - Status post total right knee replacement . Pt presents with good knee flexion AROM, poor knee ext AROM, and R LE strength as anticipated. Pt will benefit from skilled PT 2w8 to address impairments to optimize R knee function with less pain.   OBJECTIVE IMPAIRMENTS: decreased activity tolerance, decreased balance, difficulty walking, decreased ROM, decreased strength, increased edema, and pain.   ACTIVITY LIMITATIONS: carrying, lifting, bending, standing, squatting, sleeping, stairs, transfers, bathing, toileting, dressing, and caring for others  PARTICIPATION LIMITATIONS: meal prep, cleaning, laundry, driving, shopping, community activity, and camping  PERSONAL FACTORS: Fitness, Past/current experiences, Time since onset of injury/illness/exacerbation, and 3+ comorbidities: COPD, HTN, DM are also affecting patient's functional outcome.   REHAB  POTENTIAL: Good  CLINICAL DECISION MAKING: Evolving/moderate complexity  EVALUATION COMPLEXITY: Moderate   GOALS:  SHORT TERM GOALS: Target date: 06/30/24 Pt will be Ind in an initial HEP  Baseline: started Goal status: INITIAL  2.  Increase R knee ext AROM to 10d or less for progress toward function knee ext with walking Baseline: 17 lacking Goal status: INITIAL  3.  Assess 5xSTS and when pt is walking with a SPC Baseline: TBA Goal status: INITIAL  LONG TERM GOALS: Target date: 08/11/24  Pt will be Ind in a final HEP to maintain achieved LOF  Baseline: started Goal status: INITIAL  2.  Increase R knee AROM to 5-120d for needed mobility with daily activities and ambulation Baseline: 17/110 Goal status: INITIAL  3.  Increase knee and hip strength to 4+ and 4 or greater respectively for appropriate function with daily activities and ambulation Baseline:  Goal status: INITIAL  4.  Improve 5xSTS by MCID of 5 and by MCID of 60ft as  indication of improved functional mobility  Baseline: TBA Goal status: INITIAL  5.  Pt will be able to walk 500' and asc/dsc 12 steps c HR Indly for community mobility Baseline:  Goal status: INITIAL  6.  Pt's LEFS score will improve to 50% or greater as indication of improved function  Baseline: 8% Goal status: INITIAL   PLAN:  PT FREQUENCY: 2x/week  PT DURATION: 8 weeks  PLANNED INTERVENTIONS: 97164- PT Re-evaluation, 97110-Therapeutic exercises, 97530- Therapeutic activity, 97112- Neuromuscular re-education, 97535- Self Care, 02859- Manual therapy, [phone]- Gait training, [phone]- Electrical stimulation (unattended), Patient/Family education, Balance training, Stair training, Taping, Joint mobilization, Cryotherapy, and Moist heat  PLAN FOR NEXT SESSION: Assess response to HEP; progress therex as indicated; use of modalities, manual therapy as indicated.    Jacqeline Broers MS, PT 06/19/24 8:28 PM

## 2024-06-20 ENCOUNTER — Ambulatory Visit

## 2024-06-21 NOTE — Therapy (Signed)
 OUTPATIENT PHYSICAL THERAPY LOWER EXTREMITY TREATMENT   Patient Name: Jesus Martin MRN: [id number] DOB:29-Jun-1961, 63 y.o., male Today's Date: 06/22/2024  END OF SESSION:  PT End of Session - 06/22/24 1112     Visit Number 4    Number of Visits 17    Date for Recertification  08/11/24    Authorization Type [employer]    Authorization - Visit Number 4    Authorization - Number of Visits 17    Progress Note Due on Visit 10    PT Start Time 1018    PT Stop Time 1100    PT Time Calculation (min) 42 min    Activity Tolerance Patient tolerated treatment well    Behavior During Therapy WFL for tasks assessed/performed             Past Medical History:  Diagnosis Date   Anxiety    Arthritis    Asthma    childhood asthma. no an issues as an adult   Bipolar I disorder [facility]) 1991   Dr. Vincente   Borderline hyperlipidemia    history of   COPD (chronic obstructive pulmonary disease) (HCC)    Diabetes mellitus, new onset (HCC) 12/22/2017   Fibromyalgia    GERD (gastroesophageal reflux disease)    History of kidney stones    HTN (hypertension)    Hyperthyroidism    Lumbar vertebral fracture (HCC) 01/29/2019   L1   Pneumonia    Tobacco abuse    Viral conjunctivitis of both eyes 08/14/2022   Onset 2 days ago. Eyes are itchy and painful. Pt having tearing of both eyes.    Vitamin D  deficiency    Past Surgical History:  Procedure Laterality Date   APPENDECTOMY     1975   BACK SURGERY     1998 - lumbar spine   CATARACT EXTRACTION Bilateral    COLONOSCOPY     INGUINAL HERNIA REPAIR Bilateral    as a child   NOSE SURGERY     as a child   TONSILLECTOMY     TOTAL KNEE ARTHROPLASTY Right 05/15/2024   Procedure: RIGHT TOTAL KNEE ARTHROPLASTY;  Surgeon: Jerri Kay HERO, MD;  Location: [facility] OR;  Service: Orthopedics;  Laterality: Right;   UMBILICAL HERNIA REPAIR     as a child   Patient Active Problem List   Diagnosis Date Noted   Status post total  right knee replacement 05/15/2024   Pain of left scapula 03/07/2024   COPD exacerbation (HCC) 10/04/2023   Primary osteoarthritis of right knee 10/04/2023   Right knee pain 09/02/2023   Arthritis 05/18/2023   Encounter for screening colonoscopy 12/23/2022   Perforation of tympanic membrane, left 09/28/2022   Need for shingles vaccine 09/08/2022   Otitis media follow-up, not resolved, left 09/01/2022   Otitis externa 09/01/2022   Impetigo 09/01/2022   Urinary frequency 06/10/2022   Memory loss 02/05/2022   Allergic rhinitis 12/09/2021   Cervical spondylosis 02/27/2021   Rash and nonspecific skin eruption 07/10/2020   Hyperthyroidism 04/25/2020   Tobacco use 04/16/2020   Multiple pulmonary nodules 04/16/2020   Compression fracture of spine (HCC) 01/30/2019   Hyperlipidemia 09/26/2018   Foot pain, bilateral 09/26/2018   Diabetic neuropathy (HCC) 12/22/2017   Chronic pain 11/24/2017   Hepatic steatosis 11/24/2017   COPD (chronic obstructive pulmonary disease) (HCC) 11/24/2017   Diabetes (HCC)    Bipolar 1 disorder (HCC) 04/17/2011   Hypertension 04/17/2011   Involuntary weight loss 04/17/2011  PCP: Benuel Braun, DO   REFERRING PROVIDER: Jerri Kay HERO, MD   REFERRING DIAG: [phone] (ICD-10-CM) - Status post total right knee replacement   THERAPY DIAG:  Acute pain of right knee  Stiffness of right knee, not elsewhere classified  Muscle weakness (generalized)  Difficulty in walking, not elsewhere classified  Rationale for Evaluation and Treatment: Rehabilitation  ONSET DATE: 05/15/24  SUBJECTIVE:   SUBJECTIVE STATEMENT: Pt reports pain, strength, and walking are all making gradual improvement  EVAL: Pt reports because of pain and swelling, the HHPT he received was limited. Pt notes consistent use of ice machine to manage pain and swelling.  PERTINENT HISTORY: COPD, HTN, DM  PAIN:  Are you having pain? Yes: NPRS scale: 5/10 Pain location: R knee Pain  description: ache, throb Aggravating factors: knee extension, sleeping Relieving factors: Ice machine, rest  PRECAUTIONS: Knee  RED FLAGS: None   WEIGHT BEARING RESTRICTIONS: No  FALLS:  Has patient fallen in last 6 months? No  LIVING ENVIRONMENT: Lives with: lives with their family Lives in: House/apartment Stairs: No ramp Has following equipment at home: Single point cane and Environmental Consultant - 2 wheeled  OCCUPATION: Disability  PLOF: Independent  PATIENT GOALS: Less pain, better use, and to go camping/hiking  NEXT MD VISIT: 06/27/24  OBJECTIVE:  Note: Objective measures were completed at Evaluation unless otherwise noted.  PATIENT SURVEYS:  LEFS: 6/80=8%   COGNITION: Overall cognitive status: Within functional limits for tasks assessed     SENSATION: WFL- sometimes N/T  EDEMA:  Swelling present   POSTURE: flexed trunk   PALPATION: TTP to the R peri-knee area  LOWER EXTREMITY ROM:  Active ROM Right eval Left eval Rt 06/14/24 Rt 06/16/24 Rt 06/22/24  Hip flexion       Hip extension       Hip abduction       Hip adduction       Hip internal rotation       Hip external rotation       Knee flexion 110   118 120  Knee extension 17 lacking  10 lacking 9 lacking 6 lacking  Ankle dorsiflexion       Ankle plantarflexion       Ankle inversion       Ankle eversion        (Blank rows = not tested)  LOWER EXTREMITY MMT:  MMT Right eval Left eval  Hip flexion 3-   Hip extension    Hip abduction 3   Hip adduction    Hip internal rotation    Hip external rotation 3   Knee flexion 3   Knee extension 3   Ankle dorsiflexion    Ankle plantarflexion    Ankle inversion    Ankle eversion     (Blank rows = not tested)   FUNCTIONAL TESTS:  5 times sit to stand: TBA when pt is walking c a SPC 2 minute walk test: TBA when pt is walking c a SPC  GAIT: Distance walked: 150' Assistive device utilized: Environmental Consultant - 2 wheeled Level of assistance: Modified  independence Comments: WNLs for use of RW  TREATMENT DATE:   [facility] Adult PT Treatment:                                                DATE: 06/22/24 Therapeutic Exercise/Activities: Heel slides x10 Danae  sets x10 5 SLR c quad sets x10 3 LAQ 2x10 3# STS x20 s use of hands, mat table raised for hips above knees Therapeutic Exercise: Nustep 7 mins L5 UE/LE Gait training with SPC, heel to toe pattern. 370' Standing hip abd 2x10 5# each Standing knee flexion 2x10 5# each  [facility] Adult PT Treatment:                                                DATE: 06/16/24 Therapeutic Exercise/Activities: Nustep 5 mins L5 UE/LE Heel slides x10 Quad sets x10 5 SLR c quad sets x10 3 Bridging 15x 3 LAQ 2x10 3# STS x10 c use of hands Manual Therapy: Quad STM massage c hands and roller PA Fem/Tib grade 3 mobs                                                                                                                   PATIENT EDUCATION:  Education details: Eval findings, POC, HEP, self care  Person educated: Patient Education method: Explanation, Demonstration, Tactile cues, Verbal cues, and Handouts Education comprehension: verbalized understanding, returned demonstration, verbal cues required, and tactile cues required  HOME EXERCISE PROGRAM: Access Code: [email or web address] URL: [email or web address] Date: 06/22/2024 Prepared by: Dasie Daft  Exercises - Supine Quad Set  - 2 x daily - 7 x weekly - 1 sets - 10 reps - 5 hold - Active Straight Leg Raise with Quad Set  - 2 x daily - 7 x weekly - 1 sets - 10 reps - 5 hold - Supine Heel Slide  - 2 x daily - 7 x weekly - 1 sets - 10 reps - 3 hold - Sidelying Hip Abduction  - 2 x daily - 7 x weekly - 1 sets - 10 reps - Seated Long Arc Quad  - 2 x daily - 7 x weekly - 1 sets - 10 reps - Seated Knee Extension Stretch with Chair  - 2 x daily - 7 x weekly - 1 sets - 3-5 reps - 30 hold - Supine Bridge  - 2 x daily - 7 x weekly - 2 sets - 10 reps  - 3 hold - Supine Hamstring Stretch with Strap  - 1 x daily - 7 x weekly - 1 sets - 3 reps - 30 hold - Sit to Stand Without Arm Support  - 1 x daily - 7 x weekly - 2 sets - 10 reps - Seated Long Arc Quad with Ankle Weight  - 1 x daily - 7 x weekly - 2 sets - 10 reps - 3 hold - Standing Alternating Knee Flexion with Ankle Weights  - 1 x daily - 7 x weekly - 2 sets - 10 reps - 3 hold - Standing Hip Abduction with Ankle Weight  - 1 x daily - 7 x weekly - 2 sets - 10 reps - 3  hold  ASSESSMENT:  CLINICAL IMPRESSION: Pt is demonstrating improved R quad/LE strength. AROM for both ext and flexion are improved with flexion meeting it's LTG goal and ext meeting its STG. Assessed gait c a SPC and pt demonstrated good control of his R knee, and walks c a heel to toe pattern. Pt gait quality c a SPC is appropriate for level surfaces. Pt is to continue using the RW on sloped or uneven surfaces. Pt tolerated PT today without adverse effects.   EVAL: Patient is a 63 y.o. male who was seen today for physical therapy evaluation and treatment for Z96.651 (ICD-10-CM) - Status post total right knee replacement . Pt presents with good knee flexion AROM, poor knee ext AROM, and R LE strength as anticipated. Pt will benefit from skilled PT 2w8 to address impairments to optimize R knee function with less pain.   OBJECTIVE IMPAIRMENTS: decreased activity tolerance, decreased balance, difficulty walking, decreased ROM, decreased strength, increased edema, and pain.   ACTIVITY LIMITATIONS: carrying, lifting, bending, standing, squatting, sleeping, stairs, transfers, bathing, toileting, dressing, and caring for others  PARTICIPATION LIMITATIONS: meal prep, cleaning, laundry, driving, shopping, community activity, and camping  PERSONAL FACTORS: Fitness, Past/current experiences, Time since onset of injury/illness/exacerbation, and 3+ comorbidities: COPD, HTN, DM are also affecting patient's functional outcome.   REHAB  POTENTIAL: Good  CLINICAL DECISION MAKING: Evolving/moderate complexity  EVALUATION COMPLEXITY: Moderate   GOALS:  SHORT TERM GOALS: Target date: 06/30/24 Pt will be Ind in an initial HEP  Baseline: started Goal status: ONGOING  2.  Increase R knee ext AROM to 10d or less for progress toward function knee ext with walking Baseline: 17 lacking 06/22/24: 6 lacking Goal status: INITIAL  3.  Assess 5xSTS and when pt is walking with a SPC Baseline: TBA Goal status: INITIAL  LONG TERM GOALS: Target date: 08/11/24  Pt will be Ind in a final HEP to maintain achieved LOF  Baseline: started Goal status: INITIAL  2.  Increase R knee AROM to 5-120d for needed mobility with daily activities and ambulation Baseline: 17/110 06/22/24: flexion 120 Goal status: MET for flexion  3.  Increase knee and hip strength to 4+ and 4 or greater respectively for appropriate function with daily activities and ambulation Baseline:  Goal status: INITIAL  4.  Improve 5xSTS by MCID of 5 and by MCID of 84ft as indication of improved functional mobility  Baseline: TBA Goal status: INITIAL  5.  Pt will be able to walk 500' and asc/dsc 12 steps c HR Indly for community mobility Baseline:  Goal status: INITIAL  6.  Pt's LEFS score will improve to 50% or greater as indication of improved function  Baseline: 8% Goal status: INITIAL   PLAN:  PT FREQUENCY: 2x/week  PT DURATION: 8 weeks  PLANNED INTERVENTIONS: 97164- PT Re-evaluation, 97110-Therapeutic exercises, 97530- Therapeutic activity, 97112- Neuromuscular re-education, 97535- Self Care, 02859- Manual therapy, [phone]- Gait training, [phone]- Electrical stimulation (unattended), Patient/Family education, Balance training, Stair training, Taping, Joint mobilization, Cryotherapy, and Moist heat  PLAN FOR NEXT SESSION: Assess response to HEP; progress therex as indicated; use of modalities, manual therapy as indicated.    Elton Catalano MS,  PT 06/22/24 12:17 PM

## 2024-06-22 ENCOUNTER — Ambulatory Visit

## 2024-06-22 DIAGNOSIS — M25561 Pain in right knee: Secondary | ICD-10-CM | POA: Diagnosis not present

## 2024-06-22 DIAGNOSIS — M25661 Stiffness of right knee, not elsewhere classified: Secondary | ICD-10-CM

## 2024-06-22 DIAGNOSIS — M6281 Muscle weakness (generalized): Secondary | ICD-10-CM

## 2024-06-22 DIAGNOSIS — R262 Difficulty in walking, not elsewhere classified: Secondary | ICD-10-CM

## 2024-06-27 ENCOUNTER — Other Ambulatory Visit: Payer: Self-pay | Admitting: Physician Assistant

## 2024-06-27 ENCOUNTER — Ambulatory Visit: Admitting: Student

## 2024-06-27 ENCOUNTER — Ambulatory Visit

## 2024-06-27 ENCOUNTER — Telehealth: Payer: Self-pay | Admitting: Orthopaedic Surgery

## 2024-06-27 ENCOUNTER — Ambulatory Visit (INDEPENDENT_AMBULATORY_CARE_PROVIDER_SITE_OTHER): Admitting: Orthopaedic Surgery

## 2024-06-27 ENCOUNTER — Encounter: Payer: Self-pay | Admitting: Orthopaedic Surgery

## 2024-06-27 VITALS — BP 123/73 | HR 79 | Ht 72.0 in | Wt 192.8 lb

## 2024-06-27 DIAGNOSIS — M898X1 Other specified disorders of bone, shoulder: Secondary | ICD-10-CM

## 2024-06-27 DIAGNOSIS — I10 Essential (primary) hypertension: Secondary | ICD-10-CM | POA: Diagnosis not present

## 2024-06-27 DIAGNOSIS — Z96651 Presence of right artificial knee joint: Secondary | ICD-10-CM

## 2024-06-27 DIAGNOSIS — Z0000 Encounter for general adult medical examination without abnormal findings: Secondary | ICD-10-CM

## 2024-06-27 DIAGNOSIS — E119 Type 2 diabetes mellitus without complications: Secondary | ICD-10-CM | POA: Diagnosis not present

## 2024-06-27 DIAGNOSIS — Z7984 Long term (current) use of oral hypoglycemic drugs: Secondary | ICD-10-CM

## 2024-06-27 DIAGNOSIS — Z23 Encounter for immunization: Secondary | ICD-10-CM

## 2024-06-27 DIAGNOSIS — Z79899 Other long term (current) drug therapy: Secondary | ICD-10-CM

## 2024-06-27 DIAGNOSIS — Z87891 Personal history of nicotine dependence: Secondary | ICD-10-CM

## 2024-06-27 LAB — POCT GLYCOSYLATED HEMOGLOBIN (HGB A1C): HbA1c, POC (controlled diabetic range): 5.1 % (ref 0.0–7.0)

## 2024-06-27 LAB — GLUCOSE, CAPILLARY: Glucose-Capillary: 126 mg/dL — ABNORMAL HIGH (ref 70–99)

## 2024-06-27 MED ORDER — METHOCARBAMOL 500 MG PO TABS
500.0000 mg | ORAL_TABLET | Freq: Two times a day (BID) | ORAL | 0 refills | Status: AC | PRN
Start: 2024-06-27 — End: ?

## 2024-06-27 MED ORDER — HYDROCODONE-ACETAMINOPHEN 5-325 MG PO TABS: 1.0000 | ORAL_TABLET | Freq: Every day | ORAL | 0 refills | Status: DC | PRN

## 2024-06-27 MED ORDER — TRAMADOL HCL 50 MG PO TABS: 50.0000 mg | ORAL_TABLET | Freq: Two times a day (BID) | ORAL | 3 refills | Status: DC | PRN

## 2024-06-27 NOTE — Assessment & Plan Note (Addendum)
 Persists after being seen for this at last OV. At that time Jesus Martin cervical/thoracic spine was ordered but he had not heard from [facility]. He will call today about scheduling. Refilled Toradol today.

## 2024-06-27 NOTE — Progress Notes (Signed)
 CC: Follow-up  HPI:  Jesus Martin is a 63 y.o. male living with a history stated below and presents today for follow-up. Please see problem based assessment and plan for additional details.  Past Medical History:  Diagnosis Date   Anxiety    Arthritis    Asthma    childhood asthma. no an issues as an adult   Bipolar I disorder [facility]) 1991   Dr. Vincente   Borderline hyperlipidemia    history of   COPD (chronic obstructive pulmonary disease) (HCC)    Diabetes mellitus, new onset (HCC) 12/22/2017   Fibromyalgia    GERD (gastroesophageal reflux disease)    History of kidney stones    HTN (hypertension)    Hyperthyroidism    Lumbar vertebral fracture (HCC) 01/29/2019   L1   Pneumonia    Tobacco abuse    Viral conjunctivitis of both eyes 08/14/2022   Onset 2 days ago. Eyes are itchy and painful. Pt having tearing of both eyes.    Vitamin D  deficiency     Current Outpatient Medications on File Prior to Visit  Medication Sig Dispense Refill   apixaban (ELIQUIS) 2.5 MG TABS tablet Take 1 tablet (2.5 mg total) by mouth 2 (two) times daily for  30 days post-op to prevent blood clots. 60 tablet 0   docusate sodium  (COLACE) 100 MG capsule Take 1 capsule (100 mg total) by mouth daily as needed. (Patient not taking: Reported on 05/09/2024) 30 capsule 2   doxycycline  (VIBRAMYCIN ) 100 MG capsule Take 1 capsule (100 mg total) by mouth 2 (two) times daily. TO BE TAKEN AFTER SURGERY (Patient not taking: Reported on 05/09/2024) 20 capsule 0   methocarbamol  (ROBAXIN ) 500 MG tablet Take 1 tablet (500 mg total) by mouth 2 (two) times daily as needed. (Patient not taking: Reported on 05/09/2024) 20 tablet 2   ondansetron  (ZOFRAN ) 4 MG tablet Take 1 tablet (4 mg total) by mouth every 8 (eight) hours as needed for nausea or vomiting. (Patient not taking: Reported on 05/09/2024) 40 tablet 0   ALPRAZolam  (XANAX ) 1 MG tablet Take 1 mg by mouth 2 (two) times daily as needed for sleep or anxiety.      amLODipine  (NORVASC ) 10 MG tablet Take 1 tablet by mouth once daily 90 tablet 3   atorvastatin  (LIPITOR) 40 MG tablet Take 1 tablet (40 mg total) by mouth daily. 90 tablet 0   carbamazepine  (TEGRETOL ) 200 MG tablet Take 200-600 mg by mouth at bedtime.     carvedilol  (COREG ) 12.5 MG tablet Take 1 tablet (12.5 mg total) by mouth 2 (two) times daily with a meal. 180 tablet 0   diclofenac  sodium (VOLTAREN ) 1 % GEL Apply 2 g topically 4 (four) times daily. (Patient taking differently: Apply 2 g topically 4 (four) times daily as needed (Pain).) 350 g 6   Empagliflozin-metFORMIN  HCl (SYNJARDY ) 12-998 MG TABS Take 1 tablet by mouth twice daily 180 tablet 3   EPINEPHrine 0.3 mg/0.3 mL IJ SOAJ injection Inject 0.3 mg into the muscle once as needed (for anaphylactic reaction to bee stings).     Fluticasone -Umeclidin-Vilant (TRELEGY ELLIPTA ) 100-62.5-25 MCG/ACT AEPB Inhale 1 puff by mouth once daily 60 each 3   HYDROcodone -acetaminophen  (NORCO/VICODIN) 5-325 MG tablet Take 1-2 tablets by mouth daily as needed for moderate pain (pain score 4-6). 30 tablet 0   hydrocortisone  1 % ointment APPLY  OINTMENT EXTERNALLY TWICE DAILY (Patient taking differently: Apply 1 Application topically daily as needed for itching.) 29 g 0  ipratropium-albuterol  (DUONEB) 0.5-2.5 (3) MG/3ML SOLN Take 3 mLs by nebulization every 4 (four) hours as needed. 360 mL 1   lamoTRIgine  (LAMICTAL ) 200 MG tablet Take 400 mg by mouth at bedtime.     losartan -hydrochlorothiazide (HYZAAR) 100-25 MG tablet Take 1 tablet by mouth once daily 90 tablet 3   methimazole  (TAPAZOLE ) 10 MG tablet Take 1 tablet (10 mg total) by mouth daily. 90 tablet 0   omeprazole  (PRILOSEC) 40 MG capsule Take 1 capsule (40 mg total) by mouth daily. 90 capsule 0   oxyCODONE -acetaminophen  (PERCOCET) 5-325 MG tablet Take 1-2 tablets by mouth every 8 (eight) hours as needed. TO BE TAKEN AFTER SURGERY 40 tablet 0   pregabalin  (LYRICA ) 150 MG capsule Take 1 capsule (150 mg  total) by mouth 2 (two) times daily. (Patient not taking: Reported on 05/09/2024) 60 capsule 3   tamsulosin  (FLOMAX ) 0.4 MG CAPS capsule Take 1 capsule by mouth once daily 90 capsule 3   traZODone (DESYREL) 50 MG tablet Take 50-150 mg by mouth at bedtime as needed for sleep.     VENTOLIN  HFA 108 (90 Base) MCG/ACT inhaler INHALE 2 PUFFS BY MOUTH EVERY 6 HOURS AS NEEDED FOR WHEEZING OR SHORTNESS OF BREATH 18 g 3   No current facility-administered medications on file prior to visit.    Family History  Adopted: Yes    Social History   Socioeconomic History   Marital status: Married    Spouse name: [name]   Number of children: 2   Years of education: Not on file   Highest education level: Not on file  Occupational History   Occupation: disabled  Tobacco Use   Smoking status: Former    Current packs/day: 0.00    Average packs/day: 0.8 packs/day for 45.0 years (33.8 ttl pk-yrs)    Types: Cigarettes    Quit date: 10/01/2023    Years since quitting: 0.7   Smokeless tobacco: Current    Types: Snuff   Tobacco comments:    3/4 pack smoked daily [facility] 12/09/21    Patient and wife quit smoking 10/01/2023.  Vaping Use   Vaping status: Never Used  Substance and Sexual Activity   Alcohol use: Not Currently    Comment: twice per year   Drug use: No   Sexual activity: Yes  Other Topics Concern   Not on file  Social History Narrative   Lives with wife Jekhi Bolin (married 28 years) and son. Has 4 dogs and fish. Not employed. Woodworking for fun. HS graduate. Quit smoking in 09/2023. 26 pack years. No recreational drug use. Drinks twice per year, maybe. Does not exercise. Custody of 3 other children, 10, 11, 15.   Social Drivers of Corporate Investment Banker Strain: High Risk (03/08/2024)   Overall Financial Resource Strain (CARDIA)    Difficulty of Paying Living Expenses: Hard  Food Insecurity: No Food Insecurity (03/08/2024)   Hunger Vital Sign    Worried About Running Out of Food in  the Last Year: Never true    Ran Out of Food in the Last Year: Never true  Transportation Needs: No Transportation Needs (03/08/2024)   PRAPARE - Administrator, Civil Service (Medical): No    Lack of Transportation (Non-Medical): No  Physical Activity: Sufficiently Active (03/08/2024)   Exercise Vital Sign    Days of Exercise per Week: 7 days    Minutes of Exercise per Session: 60 min  Stress: Stress Concern Present (03/08/2024)   [employer] of Occupational  Health - Occupational Stress Questionnaire    Feeling of Stress: Rather much  Social Connections: Unknown (03/08/2024)   Social Connection and Isolation Panel    Frequency of Communication with Friends and Family: Once a week    Frequency of Social Gatherings with Friends and Family: Not on file    Attends Religious Services: 1 to 4 times per year    Active Member of [employer] or Organizations: No    Attends Banker Meetings: Never    Marital Status: Married  Catering Manager Violence: Not At Risk (03/08/2024)   Humiliation, Afraid, Rape, and Kick questionnaire    Fear of Current or Ex-Partner: No    Emotionally Abused: No    Physically Abused: No    Sexually Abused: No    Review of Systems: ROS negative except for what is noted on the assessment and plan.  Vitals:   06/27/24 1038  BP: 123/73  Pulse: 79  SpO2: 96%  Weight: 192 lb 12.8 oz (87.5 kg)  Height: 6' (1.829 m)    Physical Exam: Constitutional: well-appearing, sitting in chair, in no acute distress Cardiovascular: regular rate and rhythm, no m/r/g Pulmonary/Chest: normal work of breathing on room air, lungs clear to auscultation bilaterally MSK: normal bulk and tone Skin: warm and dry Psych: normal mood and behavior  Assessment & Plan:     Patient discussed with Dr. Jeanelle  Hypertension Normotensive. Continue amlodipine  10 mg daily, Carvedilol  12.5 mg twice daily, losartan /HCTZ 100/25 mg daily  Pain of left  scapula Persists after being seen for this at last OV. At that time DG cervical/thoracic spine was ordered but he had not heard from [facility]. He will call today about scheduling. Refilled Toradol today.   Diabetes (HCC) A1c today is 5.1 which I continue to congratulate him for the great work. Continue Synjardy .   Preventative health care Flu vaccine.    Norman Lobstein, D.O. [facility] Internal Medicine, PGY-2 Phone: [phone] Date 06/27/2024 Time 3:05 PM

## 2024-06-27 NOTE — Progress Notes (Signed)
 Post-Op Visit Note   Patient: Jesus Martin           Date of Birth: Nov 03, 1960           MRN: [id number] Visit Date: 06/27/2024 PCP: Benuel Braun, DO   Assessment & Plan:  Chief Complaint:  Chief Complaint  Patient presents with   Right Knee - Follow-up    Right TKA 05/15/2024   Visit Diagnoses:  1. Status post total right knee replacement     Plan: History of Present Illness Jesus Martin is a 63 year old male who presents for follow-up after right total knee replacement surgery.  He is six weeks post-surgery and attends physical therapy twice a week. He achieves 120 degrees of flexion and 6 degrees of extension, indicating good progress. He is considering reducing formal therapy sessions as he exercises daily at home.  He is transitioning from a walker to a cane, recently approved by his physical therapist. He experiences a 'nickel size' area of pain with a 'bump' about the size of a quarter, likely due to postoperative swelling. His dog occasionally jumps on his leg, increasing discomfort. He uses ice for swelling and applies lotion for skin shedding.  He received a new brace with a pump before surgery, which he has not yet used. He believes it may be for cold compression therapy to assist with swelling.  Physical Exam MUSCULOSKELETAL: Right knee flexion 120 degrees, extension 6 degrees.  Surgical scar is fully healed.  Results RADIOLOGY Knee X-ray: Implant appears well-positioned, symptoms likely related to postoperative edema.  Assessment and Plan Status post right total knee replacement with postoperative right knee swelling Six weeks post-surgery with swelling due to postoperative changes. Flexion at 120 degrees, extension at 6 degrees. X-ray shows well-positioned implant. Symptoms linked to swelling, not implant. - Continue daily home exercises. - Apply ice to reduce swelling. - Use cold compression therapy brace with pump for swelling reduction,  especially when upright for over 30 minutes. - Transition from walker to cane as tolerated. - Schedule follow-up in six weeks.  Follow-Up Instructions: Return in about 6 weeks (around 08/08/2024) for with lindsey.   Orders:  Orders Placed This Encounter  Procedures   XR Knee 1-2 Views Right   No orders of the defined types were placed in this encounter.   Imaging: XR Knee 1-2 Views Right Result Date: 06/27/2024 X-rays of the right knee demonstrate a stable right total knee replacement in good alignment    PMFS History: Patient Active Problem List   Diagnosis Date Noted   Status post total right knee replacement 05/15/2024   Pain of left scapula 03/07/2024   COPD exacerbation (HCC) 10/04/2023   Primary osteoarthritis of right knee 10/04/2023   Right knee pain 09/02/2023   Arthritis 05/18/2023   Encounter for screening colonoscopy 12/23/2022   Perforation of tympanic membrane, left 09/28/2022   Need for shingles vaccine 09/08/2022   Otitis media follow-up, not resolved, left 09/01/2022   Otitis externa 09/01/2022   Impetigo 09/01/2022   Urinary frequency 06/10/2022   Memory loss 02/05/2022   Allergic rhinitis 12/09/2021   Cervical spondylosis 02/27/2021   Rash and nonspecific skin eruption 07/10/2020   Hyperthyroidism 04/25/2020   Tobacco use 04/16/2020   Multiple pulmonary nodules 04/16/2020   Compression fracture of spine (HCC) 01/30/2019   Hyperlipidemia 09/26/2018   Foot pain, bilateral 09/26/2018   Diabetic neuropathy (HCC) 12/22/2017   Chronic pain 11/24/2017   Hepatic steatosis 11/24/2017   COPD (chronic  obstructive pulmonary disease) (HCC) 11/24/2017   Diabetes (HCC)    Bipolar 1 disorder (HCC) 04/17/2011   Hypertension 04/17/2011   Involuntary weight loss 04/17/2011   Past Medical History:  Diagnosis Date   Anxiety    Arthritis    Asthma    childhood asthma. no an issues as an adult   Bipolar I disorder [facility]) 1991   Dr. Vincente   Borderline  hyperlipidemia    history of   COPD (chronic obstructive pulmonary disease) (HCC)    Diabetes mellitus, new onset (HCC) 12/22/2017   Fibromyalgia    GERD (gastroesophageal reflux disease)    History of kidney stones    HTN (hypertension)    Hyperthyroidism    Lumbar vertebral fracture (HCC) 01/29/2019   L1   Pneumonia    Tobacco abuse    Viral conjunctivitis of both eyes 08/14/2022   Onset 2 days ago. Eyes are itchy and painful. Pt having tearing of both eyes.    Vitamin D  deficiency     Family History  Adopted: Yes    Past Surgical History:  Procedure Laterality Date   APPENDECTOMY     1975   BACK SURGERY     1998 - lumbar spine   CATARACT EXTRACTION Bilateral    COLONOSCOPY     INGUINAL HERNIA REPAIR Bilateral    as a child   NOSE SURGERY     as a child   TONSILLECTOMY     TOTAL KNEE ARTHROPLASTY Right 05/15/2024   Procedure: RIGHT TOTAL KNEE ARTHROPLASTY;  Surgeon: Jerri Kay HERO, MD;  Location: [facility] OR;  Service: Orthopedics;  Laterality: Right;   UMBILICAL HERNIA REPAIR     as a child   Social History   Occupational History   Occupation: disabled  Tobacco Use   Smoking status: Former    Current packs/day: 0.00    Average packs/day: 0.8 packs/day for 45.0 years (33.8 ttl pk-yrs)    Types: Cigarettes    Quit date: 10/01/2023    Years since quitting: 0.7   Smokeless tobacco: Current    Types: Snuff   Tobacco comments:    3/4 pack smoked daily [facility] 12/09/21    Patient and wife quit smoking 10/01/2023.  Vaping Use   Vaping status: Never Used  Substance and Sexual Activity   Alcohol use: Not Currently    Comment: twice per year   Drug use: No   Sexual activity: Yes

## 2024-06-27 NOTE — Assessment & Plan Note (Signed)
 Normotensive. Continue amlodipine  10 mg daily, Carvedilol  12.5 mg twice daily, losartan /HCTZ 100/25 mg daily

## 2024-06-27 NOTE — Telephone Encounter (Signed)
 Pt's wife called asking for refill of hydrocodone  and methocarbamol . Please send to [facility]. Pt phone number is [phone].

## 2024-06-27 NOTE — Telephone Encounter (Signed)
 Notified via MyChart message

## 2024-06-27 NOTE — Telephone Encounter (Signed)
 sent

## 2024-06-27 NOTE — Therapy (Signed)
 OUTPATIENT PHYSICAL THERAPY LOWER EXTREMITY TREATMENT   Patient Name: Jesus Martin MRN: [id number] DOB:1961/04/28, 63 y.o., male Today's Date: 06/28/2024  END OF SESSION:  PT End of Session - 06/28/24 1101     Visit Number 5    Number of Visits 17    Date for Recertification  08/11/24    Authorization Type [employer]TE    Authorization - Visit Number 5    Authorization - Number of Visits 27    Progress Note Due on Visit 10    PT Start Time 1101    PT Stop Time 1145    PT Time Calculation (min) 44 min    Activity Tolerance Patient tolerated treatment well    Behavior During Therapy WFL for tasks assessed/performed              Past Medical History:  Diagnosis Date   Anxiety    Arthritis    Asthma    childhood asthma. no an issues as an adult   Bipolar I disorder [facility]) 1991   Dr. Vincente   Borderline hyperlipidemia    history of   COPD (chronic obstructive pulmonary disease) (HCC)    Diabetes mellitus, new onset (HCC) 12/22/2017   Fibromyalgia    GERD (gastroesophageal reflux disease)    History of kidney stones    HTN (hypertension)    Hyperthyroidism    Lumbar vertebral fracture (HCC) 01/29/2019   L1   Pneumonia    Tobacco abuse    Viral conjunctivitis of both eyes 08/14/2022   Onset 2 days ago. Eyes are itchy and painful. Pt having tearing of both eyes.    Vitamin D  deficiency    Past Surgical History:  Procedure Laterality Date   APPENDECTOMY     1975   BACK SURGERY     1998 - lumbar spine   CATARACT EXTRACTION Bilateral    COLONOSCOPY     INGUINAL HERNIA REPAIR Bilateral    as a child   NOSE SURGERY     as a child   TONSILLECTOMY     TOTAL KNEE ARTHROPLASTY Right 05/15/2024   Procedure: RIGHT TOTAL KNEE ARTHROPLASTY;  Surgeon: Jerri Kay HERO, MD;  Location: [facility] OR;  Service: Orthopedics;  Laterality: Right;   UMBILICAL HERNIA REPAIR     as a child   Patient Active Problem List   Diagnosis Date Noted   Status post total  right knee replacement 05/15/2024   Pain of left scapula 03/07/2024   COPD exacerbation (HCC) 10/04/2023   Primary osteoarthritis of right knee 10/04/2023   Right knee pain 09/02/2023   Arthritis 05/18/2023   Preventative health care 12/23/2022   Perforation of tympanic membrane, left 09/28/2022   Need for shingles vaccine 09/08/2022   Otitis media follow-up, not resolved, left 09/01/2022   Otitis externa 09/01/2022   Impetigo 09/01/2022   Urinary frequency 06/10/2022   Memory loss 02/05/2022   Allergic rhinitis 12/09/2021   Cervical spondylosis 02/27/2021   Rash and nonspecific skin eruption 07/10/2020   Hyperthyroidism 04/25/2020   Tobacco use 04/16/2020   Multiple pulmonary nodules 04/16/2020   Compression fracture of spine (HCC) 01/30/2019   Hyperlipidemia 09/26/2018   Foot pain, bilateral 09/26/2018   Diabetic neuropathy (HCC) 12/22/2017   Chronic pain 11/24/2017   Hepatic steatosis 11/24/2017   COPD (chronic obstructive pulmonary disease) (HCC) 11/24/2017   Diabetes (HCC)    Bipolar 1 disorder (HCC) 04/17/2011   Hypertension 04/17/2011   Involuntary weight loss 04/17/2011  PCP: Benuel Braun, DO   REFERRING PROVIDER: Jerri Kay HERO, MD   REFERRING DIAG: [phone] (ICD-10-CM) - Status post total right knee replacement   THERAPY DIAG:  Acute pain of right knee  Stiffness of right knee, not elsewhere classified  Muscle weakness (generalized)  Difficulty in walking, not elsewhere classified  Rationale for Evaluation and Treatment: Rehabilitation  ONSET DATE: 05/15/24  SUBJECTIVE:   SUBJECTIVE STATEMENT: Pt reports having an appt c Dr. Jerri yesterday and it went well. He notes a lot of stiffness in the AM.  EVAL: Pt reports because of pain and swelling, the HHPT he received was limited. Pt notes consistent use of ice machine to manage pain and swelling.  PERTINENT HISTORY: COPD, HTN, DM  PAIN:  Are you having pain? Yes: NPRS scale: 5/10 Pain location: R  knee Pain description: ache, throb Aggravating factors: knee extension, sleeping Relieving factors: Ice machine, rest  PRECAUTIONS: Knee  RED FLAGS: None   WEIGHT BEARING RESTRICTIONS: No  FALLS:  Has patient fallen in last 6 months? No  LIVING ENVIRONMENT: Lives with: lives with their family Lives in: House/apartment Stairs: No ramp Has following equipment at home: Single point cane and Environmental Consultant - 2 wheeled  OCCUPATION: Disability  PLOF: Independent  PATIENT GOALS: Less pain, better use, and to go camping/hiking  NEXT MD VISIT: 06/27/24  OBJECTIVE:  Note: Objective measures were completed at Evaluation unless otherwise noted.  PATIENT SURVEYS:  LEFS: 6/80=8%   COGNITION: Overall cognitive status: Within functional limits for tasks assessed     SENSATION: WFL- sometimes N/T  EDEMA:  Swelling present   POSTURE: flexed trunk   PALPATION: TTP to the R peri-knee area  LOWER EXTREMITY ROM:  Active ROM Right eval Left eval Rt 06/14/24 Rt 06/16/24 Rt 06/22/24 Rt 11/192/5  Hip flexion        Hip extension        Hip abduction        Hip adduction        Hip internal rotation        Hip external rotation        Knee flexion 110   118 120   Knee extension 17 lacking  10 lacking 9 lacking 6 lacking 6 lacking  Ankle dorsiflexion        Ankle plantarflexion        Ankle inversion        Ankle eversion         (Blank rows = not tested)  LOWER EXTREMITY MMT:  MMT Right eval Left eval  Hip flexion 3-   Hip extension    Hip abduction 3   Hip adduction    Hip internal rotation    Hip external rotation 3   Knee flexion 3   Knee extension 3   Ankle dorsiflexion    Ankle plantarflexion    Ankle inversion    Ankle eversion     (Blank rows = not tested)   FUNCTIONAL TESTS:  5 times sit to stand: TBA when pt is walking c a SPC 2 minute walk test: TBA when pt is walking c a SPC  GAIT: Distance walked: 150' Assistive device utilized: Environmental Consultant - 2  wheeled Level of assistance: Modified independence Comments: WNLs for use of RW  TREATMENT DATE:   [facility] Adult PT Treatment:  DATE: 06/28/24 Therapeutic Exercise/Activities: Long sit Quad sets x10 5 Long sit knee ext/hamstring stretch c strap x5 30 SLR c quad sets x10 3 Standing TKE c GTB 2x15 Nustep 6 mins L5 UE/LE 5xSTS, x2, bari-chair Gait training for heel to toe pattern to promote knee ext Manual Therapy: PA grade 3 mobs fem/tib  [facility] Adult PT Treatment:                                                DATE: 06/22/24 Therapeutic Exercise/Activities: Heel slides x10 Quad sets x10 5 SLR c quad sets x10 3 LAQ 2x10 3# STS x20 s use of hands, mat table raised for hips above knees Therapeutic Exercise: Nustep 7 mins L5 UE/LE Gait training with SPC, heel to toe pattern. 370' Standing hip abd 2x10 5# each Standing knee flexion 2x10 5# each                                                                       PATIENT EDUCATION:  Education details: Eval findings, POC, HEP, self care  Person educated: Patient Education method: Explanation, Demonstration, Tactile cues, Verbal cues, and Handouts Education comprehension: verbalized understanding, returned demonstration, verbal cues required, and tactile cues required  HOME EXERCISE PROGRAM: Access Code: [email or web address] URL: [email or web address] Date: 06/22/2024 Prepared by: Dasie Daft  Exercises - Supine Quad Set  - 2 x daily - 7 x weekly - 1 sets - 10 reps - 5 hold - Active Straight Leg Raise with Quad Set  - 2 x daily - 7 x weekly - 1 sets - 10 reps - 5 hold - Supine Heel Slide  - 2 x daily - 7 x weekly - 1 sets - 10 reps - 3 hold - Sidelying Hip Abduction  - 2 x daily - 7 x weekly - 1 sets - 10 reps - Seated Long Arc Quad  - 2 x daily - 7 x weekly - 1 sets - 10 reps - Seated Knee Extension Stretch with Chair  - 2 x daily - 7 x weekly - 1 sets - 3-5 reps - 30  hold - Supine Bridge  - 2 x daily - 7 x weekly - 2 sets - 10 reps - 3 hold - Supine Hamstring Stretch with Strap  - 1 x daily - 7 x weekly - 1 sets - 3 reps - 30 hold - Sit to Stand Without Arm Support  - 1 x daily - 7 x weekly - 2 sets - 10 reps - Seated Long Arc Quad with Ankle Weight  - 1 x daily - 7 x weekly - 2 sets - 10 reps - 3 hold - Standing Alternating Knee Flexion with Ankle Weights  - 1 x daily - 7 x weekly - 2 sets - 10 reps - 3 hold - Standing Hip Abduction with Ankle Weight  - 1 x daily - 7 x weekly - 2 sets - 10 reps - 3 hold  ASSESSMENT:  CLINICAL IMPRESSION: Pt presented to PT using a SPC. Assessed 5xSTS and the . PT focused improving R knee ext ROM per ex,  manual mobs, and gait training. R knee ext AROM=6d. Pt tolerated PT today without adverse effects.    EVAL: Patient is a 63 y.o. male who was seen today for physical therapy evaluation and treatment for Z96.651 (ICD-10-CM) - Status post total right knee replacement . Pt presents with good knee flexion AROM, poor knee ext AROM, and R LE strength as anticipated. Pt will benefit from skilled PT 2w8 to address impairments to optimize R knee function with less pain.   OBJECTIVE IMPAIRMENTS: decreased activity tolerance, decreased balance, difficulty walking, decreased ROM, decreased strength, increased edema, and pain.   ACTIVITY LIMITATIONS: carrying, lifting, bending, standing, squatting, sleeping, stairs, transfers, bathing, toileting, dressing, and caring for others  PARTICIPATION LIMITATIONS: meal prep, cleaning, laundry, driving, shopping, community activity, and camping  PERSONAL FACTORS: Fitness, Past/current experiences, Time since onset of injury/illness/exacerbation, and 3+ comorbidities: COPD, HTN, DM are also affecting patient's functional outcome.   REHAB POTENTIAL: Good  CLINICAL DECISION MAKING: Evolving/moderate complexity  EVALUATION COMPLEXITY: Moderate   GOALS:  SHORT TERM GOALS: Target date:  06/30/24 Pt will be Ind in an initial HEP  Baseline: started Goal status: MET  2.  Increase R knee ext AROM to 10d or less for progress toward function knee ext with walking Baseline: 17 lacking 06/22/24: 6 lacking Goal status: MET  3.  Assess 5xSTS and when pt is walking with a SPC Baseline: TBA Goal status: MET  LONG TERM GOALS: Target date: 08/11/24  Pt will be Ind in a final HEP to maintain achieved LOF  Baseline: started Goal status: INITIAL  2.  Increase R knee AROM to 5-120d for needed mobility with daily activities and ambulation Baseline: 17/110 06/22/24: flexion 120 Goal status: MET for flexion  3.  Increase knee and hip strength to 4+ and 4 or greater respectively for appropriate function with daily activities and ambulation Baseline:  Goal status: INITIAL  4.  Improve 5xSTS by MCID of 5 and by MCID of 14ft as indication of improved functional mobility  Baseline: TBA 06/28/24: 5xSTS 18.8 sec; 350' Goal status: INITIAL  5.  Pt will be able to walk 500' and asc/dsc 12 steps c HR Indly for community mobility Baseline:  Goal status: INITIAL  6.  Pt's LEFS score will improve to 50% or greater as indication of improved function  Baseline: 8% Goal status: INITIAL   PLAN:  PT FREQUENCY: 2x/week  PT DURATION: 8 weeks  PLANNED INTERVENTIONS: 97164- PT Re-evaluation, 97110-Therapeutic exercises, 97530- Therapeutic activity, 97112- Neuromuscular re-education, 97535- Self Care, 02859- Manual therapy, [phone]- Gait training, [phone]- Electrical stimulation (unattended), Patient/Family education, Balance training, Stair training, Taping, Joint mobilization, Cryotherapy, and Moist heat  PLAN FOR NEXT SESSION: Assess response to HEP; progress therex as indicated; use of modalities, manual therapy as indicated.    Johntay Doolen MS, PT 06/28/24 1:29 PM

## 2024-06-27 NOTE — Assessment & Plan Note (Signed)
 Flu vaccine

## 2024-06-27 NOTE — Assessment & Plan Note (Signed)
 A1c today is 5.1 which I continue to congratulate him for the great work. Continue Synjardy .

## 2024-06-28 ENCOUNTER — Ambulatory Visit

## 2024-06-28 DIAGNOSIS — R262 Difficulty in walking, not elsewhere classified: Secondary | ICD-10-CM

## 2024-06-28 DIAGNOSIS — M25561 Pain in right knee: Secondary | ICD-10-CM

## 2024-06-28 DIAGNOSIS — M25661 Stiffness of right knee, not elsewhere classified: Secondary | ICD-10-CM

## 2024-06-28 DIAGNOSIS — M6281 Muscle weakness (generalized): Secondary | ICD-10-CM

## 2024-06-29 NOTE — Therapy (Signed)
 OUTPATIENT PHYSICAL THERAPY LOWER EXTREMITY TREATMENT   Patient Name: ELDWIN Martin MRN: [id number] DOB:07-22-1961, 63 y.o., male Today's Date: 06/30/2024  END OF SESSION:  PT End of Session - 06/30/24 1114     Visit Number 6    Number of Visits 17    Date for Recertification  08/11/24    Authorization Type [employer]PLETE    Authorization - Visit Number 6    Authorization - Number of Visits 27    Progress Note Due on Visit 10    PT Start Time 1104    PT Stop Time 1145    PT Time Calculation (min) 41 min    Activity Tolerance Patient tolerated treatment well    Behavior During Therapy WFL for tasks assessed/performed               Past Medical History:  Diagnosis Date   Anxiety    Arthritis    Asthma    childhood asthma. no an issues as an adult   Bipolar I disorder [facility]) 1991   Dr. Vincente   Borderline hyperlipidemia    history of   COPD (chronic obstructive pulmonary disease) (HCC)    Diabetes mellitus, new onset (HCC) 12/22/2017   Fibromyalgia    GERD (gastroesophageal reflux disease)    History of kidney stones    HTN (hypertension)    Hyperthyroidism    Lumbar vertebral fracture (HCC) 01/29/2019   L1   Pneumonia    Tobacco abuse    Viral conjunctivitis of both eyes 08/14/2022   Onset 2 days ago. Eyes are itchy and painful. Pt having tearing of both eyes.    Vitamin D  deficiency    Past Surgical History:  Procedure Laterality Date   APPENDECTOMY     1975   BACK SURGERY     1998 - lumbar spine   CATARACT EXTRACTION Bilateral    COLONOSCOPY     INGUINAL HERNIA REPAIR Bilateral    as a child   NOSE SURGERY     as a child   TONSILLECTOMY     TOTAL KNEE ARTHROPLASTY Right 05/15/2024   Procedure: RIGHT TOTAL KNEE ARTHROPLASTY;  Surgeon: Jerri Kay HERO, MD;  Location: [facility] OR;  Service: Orthopedics;  Laterality: Right;   UMBILICAL HERNIA REPAIR     as a child   Patient Active Problem List   Diagnosis Date Noted   Status post total  right knee replacement 05/15/2024   Pain of left scapula 03/07/2024   COPD exacerbation (HCC) 10/04/2023   Primary osteoarthritis of right knee 10/04/2023   Right knee pain 09/02/2023   Arthritis 05/18/2023   Preventative health care 12/23/2022   Perforation of tympanic membrane, left 09/28/2022   Need for shingles vaccine 09/08/2022   Otitis media follow-up, not resolved, left 09/01/2022   Otitis externa 09/01/2022   Impetigo 09/01/2022   Urinary frequency 06/10/2022   Memory loss 02/05/2022   Allergic rhinitis 12/09/2021   Cervical spondylosis 02/27/2021   Rash and nonspecific skin eruption 07/10/2020   Hyperthyroidism 04/25/2020   Tobacco use 04/16/2020   Multiple pulmonary nodules 04/16/2020   Compression fracture of spine (HCC) 01/30/2019   Hyperlipidemia 09/26/2018   Foot pain, bilateral 09/26/2018   Diabetic neuropathy (HCC) 12/22/2017   Chronic pain 11/24/2017   Hepatic steatosis 11/24/2017   COPD (chronic obstructive pulmonary disease) (HCC) 11/24/2017   Diabetes (HCC)    Bipolar 1 disorder (HCC) 04/17/2011   Hypertension 04/17/2011   Involuntary weight loss 04/17/2011  PCP: Benuel Braun, DO   REFERRING PROVIDER: Jerri Kay HERO, MD   REFERRING DIAG: [phone] (ICD-10-CM) - Status post total right knee replacement   THERAPY DIAG:  Acute pain of right knee  Stiffness of right knee, not elsewhere classified  Muscle weakness (generalized)  Difficulty in walking, not elsewhere classified  Rationale for Evaluation and Treatment: Rehabilitation  ONSET DATE: 05/15/24  SUBJECTIVE:   SUBJECTIVE STATEMENT: Pt feels he is gradually getting better.  EVAL: Pt reports because of pain and swelling, the HHPT he received was limited. Pt notes consistent use of ice machine to manage pain and swelling.  PERTINENT HISTORY: COPD, HTN, DM  PAIN:  Are you having pain? Yes: NPRS scale: 5/10 Pain location: R knee Pain description: ache, throb Aggravating factors:  knee extension, sleeping Relieving factors: Ice machine, rest  PRECAUTIONS: Knee  RED FLAGS: None   WEIGHT BEARING RESTRICTIONS: No  FALLS:  Has patient fallen in last 6 months? No  LIVING ENVIRONMENT: Lives with: lives with their family Lives in: House/apartment Stairs: No ramp Has following equipment at home: Single point cane and Environmental Consultant - 2 wheeled  OCCUPATION: Disability  PLOF: Independent  PATIENT GOALS: Less pain, better use, and to go camping/hiking  NEXT MD VISIT: 06/27/24  OBJECTIVE:  Note: Objective measures were completed at Evaluation unless otherwise noted.  PATIENT SURVEYS:  LEFS: 6/80=8%   COGNITION: Overall cognitive status: Within functional limits for tasks assessed     SENSATION: WFL- sometimes N/T  EDEMA:  Swelling present   POSTURE: flexed trunk   PALPATION: TTP to the R peri-knee area  LOWER EXTREMITY ROM:  Active ROM Right eval Left eval Rt 06/14/24 Rt 06/16/24 Rt 06/22/24 Rt 11/192/5  Hip flexion        Hip extension        Hip abduction        Hip adduction        Hip internal rotation        Hip external rotation        Knee flexion 110   118 120   Knee extension 17 lacking  10 lacking 9 lacking 6 lacking 6 lacking  Ankle dorsiflexion        Ankle plantarflexion        Ankle inversion        Ankle eversion         (Blank rows = not tested)  LOWER EXTREMITY MMT:  MMT Right eval Left eval  Hip flexion 3-   Hip extension    Hip abduction 3   Hip adduction    Hip internal rotation    Hip external rotation 3   Knee flexion 3   Knee extension 3   Ankle dorsiflexion    Ankle plantarflexion    Ankle inversion    Ankle eversion     (Blank rows = not tested)   FUNCTIONAL TESTS:  5 times sit to stand: TBA when pt is walking c a SPC 2 minute walk test: TBA when pt is walking c a SPC  GAIT: Distance walked: 150' Assistive device utilized: Environmental Consultant - 2 wheeled Level of assistance: Modified independence Comments:  WNLs for use of RW  TREATMENT DATE:   [facility] Adult PT Treatment:  DATE: 06/30/24 Therapeutic Exercise/Activities: Nustep 6 mins L6 UE/LE Standing TKE BluTB 2x15 Knee ext, backward walking on TM 1 minx3 Seated hamstring curl 3x10 GTB Sit t/f standing 2x10 Standing heel raise/toe lifts 2x15 Standing hip abd 2x10 5#  [facility] Adult PT Treatment:                                                DATE: 06/28/24 Therapeutic Exercise/Activities: Long sit Quad sets x10 5 Long sit knee ext/hamstring stretch c strap x5 30 SLR c quad sets x10 3 Standing TKE c GTB 2x15 Nustep 6 mins L5 UE/LE 5xSTS, x2, bari-chair Gait training for heel to toe pattern to promote knee ext Manual Therapy: PA grade 3 mobs fem/tib                                                                       PATIENT EDUCATION:  Education details: Eval findings, POC, HEP, self care  Person educated: Patient Education method: Explanation, Demonstration, Tactile cues, Verbal cues, and Handouts Education comprehension: verbalized understanding, returned demonstration, verbal cues required, and tactile cues required  HOME EXERCISE PROGRAM: Access Code: [email or web address] URL: [email or web address] Date: 06/22/2024 Prepared by: Dasie Daft  Exercises - Supine Quad Set  - 2 x daily - 7 x weekly - 1 sets - 10 reps - 5 hold - Active Straight Leg Raise with Quad Set  - 2 x daily - 7 x weekly - 1 sets - 10 reps - 5 hold - Supine Heel Slide  - 2 x daily - 7 x weekly - 1 sets - 10 reps - 3 hold - Sidelying Hip Abduction  - 2 x daily - 7 x weekly - 1 sets - 10 reps - Seated Long Arc Quad  - 2 x daily - 7 x weekly - 1 sets - 10 reps - Seated Knee Extension Stretch with Chair  - 2 x daily - 7 x weekly - 1 sets - 3-5 reps - 30 hold - Supine Bridge  - 2 x daily - 7 x weekly - 2 sets - 10 reps - 3 hold - Supine Hamstring Stretch with Strap  - 1 x daily - 7 x weekly - 1 sets - 3 reps  - 30 hold - Sit to Stand Without Arm Support  - 1 x daily - 7 x weekly - 2 sets - 10 reps - Seated Long Arc Quad with Ankle Weight  - 1 x daily - 7 x weekly - 2 sets - 10 reps - 3 hold - Standing Alternating Knee Flexion with Ankle Weights  - 1 x daily - 7 x weekly - 2 sets - 10 reps - 3 hold - Standing Hip Abduction with Ankle Weight  - 1 x daily - 7 x weekly - 2 sets - 10 reps - 3 hold  ASSESSMENT:  CLINICAL IMPRESSION: PT focused on R knee/LE strengthening primarily in the CKC. Pt tolerated prescribed exs without adverse effects. Pt is making appropriate progress with ROM, strength, and function. Pt tolerated PT today without adverse effects   EVAL: Patient is a 63 y.o.  male who was seen today for physical therapy evaluation and treatment for Z96.651 (ICD-10-CM) - Status post total right knee replacement . Pt presents with good knee flexion AROM, poor knee ext AROM, and R LE strength as anticipated. Pt will benefit from skilled PT 2w8 to address impairments to optimize R knee function with less pain.   OBJECTIVE IMPAIRMENTS: decreased activity tolerance, decreased balance, difficulty walking, decreased ROM, decreased strength, increased edema, and pain.   ACTIVITY LIMITATIONS: carrying, lifting, bending, standing, squatting, sleeping, stairs, transfers, bathing, toileting, dressing, and caring for others  PARTICIPATION LIMITATIONS: meal prep, cleaning, laundry, driving, shopping, community activity, and camping  PERSONAL FACTORS: Fitness, Past/current experiences, Time since onset of injury/illness/exacerbation, and 3+ comorbidities: COPD, HTN, DM are also affecting patient's functional outcome.   REHAB POTENTIAL: Good  CLINICAL DECISION MAKING: Evolving/moderate complexity  EVALUATION COMPLEXITY: Moderate   GOALS:  SHORT TERM GOALS: Target date: 06/30/24 Pt will be Ind in an initial HEP  Baseline: started Goal status: MET  2.  Increase R knee ext AROM to 10d or less for  progress toward function knee ext with walking Baseline: 17 lacking 06/22/24: 6 lacking Goal status: MET  3.  Assess 5xSTS and when pt is walking with a SPC Baseline: TBA Goal status: MET  LONG TERM GOALS: Target date: 08/11/24  Pt will be Ind in a final HEP to maintain achieved LOF  Baseline: started Goal status: INITIAL  2.  Increase R knee AROM to 5-120d for needed mobility with daily activities and ambulation Baseline: 17/110 06/22/24: flexion 120 Goal status: MET for flexion  3.  Increase knee and hip strength to 4+ and 4 or greater respectively for appropriate function with daily activities and ambulation Baseline:  Goal status: INITIAL  4.  Improve 5xSTS by MCID of 5 and by MCID of 70ft as indication of improved functional mobility  Baseline: TBA 06/28/24: 5xSTS 18.8 sec; 350' Goal status: INITIAL  5.  Pt will be able to walk 500' and asc/dsc 12 steps c HR Indly for community mobility Baseline:  Goal status: INITIAL  6.  Pt's LEFS score will improve to 50% or greater as indication of improved function  Baseline: 8% Goal status: INITIAL   PLAN:  PT FREQUENCY: 2x/week  PT DURATION: 8 weeks  PLANNED INTERVENTIONS: 97164- PT Re-evaluation, 97110-Therapeutic exercises, 97530- Therapeutic activity, 97112- Neuromuscular re-education, 97535- Self Care, 02859- Manual therapy, [phone]- Gait training, [phone]- Electrical stimulation (unattended), Patient/Family education, Balance training, Stair training, Taping, Joint mobilization, Cryotherapy, and Moist heat  PLAN FOR NEXT SESSION: Assess response to HEP; progress therex as indicated; use of modalities, manual therapy as indicated.    Trana Ressler MS, PT 06/30/24 11:48 AM

## 2024-06-30 ENCOUNTER — Other Ambulatory Visit: Payer: Self-pay | Admitting: Student

## 2024-06-30 ENCOUNTER — Ambulatory Visit

## 2024-06-30 DIAGNOSIS — E1142 Type 2 diabetes mellitus with diabetic polyneuropathy: Secondary | ICD-10-CM

## 2024-06-30 DIAGNOSIS — M25661 Stiffness of right knee, not elsewhere classified: Secondary | ICD-10-CM

## 2024-06-30 DIAGNOSIS — E119 Type 2 diabetes mellitus without complications: Secondary | ICD-10-CM

## 2024-06-30 DIAGNOSIS — M25561 Pain in right knee: Secondary | ICD-10-CM

## 2024-06-30 DIAGNOSIS — R262 Difficulty in walking, not elsewhere classified: Secondary | ICD-10-CM

## 2024-06-30 DIAGNOSIS — M6281 Muscle weakness (generalized): Secondary | ICD-10-CM

## 2024-06-30 NOTE — Progress Notes (Signed)
 Internal Medicine Clinic Attending  Case discussed with the resident at the time of the visit.  We reviewed the resident's history and exam and pertinent patient test results.  I agree with the assessment, diagnosis, and plan of care documented in the resident's note.

## 2024-07-05 ENCOUNTER — Other Ambulatory Visit: Payer: Self-pay

## 2024-07-05 DIAGNOSIS — I10 Essential (primary) hypertension: Secondary | ICD-10-CM

## 2024-07-08 ENCOUNTER — Other Ambulatory Visit: Payer: Self-pay | Admitting: Student

## 2024-07-08 DIAGNOSIS — E7800 Pure hypercholesterolemia, unspecified: Secondary | ICD-10-CM

## 2024-07-10 NOTE — Telephone Encounter (Signed)
 Medication sent to pharmacy

## 2024-07-14 ENCOUNTER — Telehealth: Payer: Self-pay | Admitting: Orthopaedic Surgery

## 2024-07-14 DIAGNOSIS — M898X1 Other specified disorders of bone, shoulder: Secondary | ICD-10-CM

## 2024-07-14 MED ORDER — TRAMADOL HCL 50 MG PO TABS: 50.0000 mg | ORAL_TABLET | Freq: Every day | ORAL | 0 refills | Status: AC | PRN

## 2024-07-14 NOTE — Addendum Note (Signed)
 Addended by: JERRI LOISE SHARPER on: 07/14/2024 05:21 PM   Modules accepted: Orders

## 2024-07-14 NOTE — Telephone Encounter (Signed)
 Patient called and needs a refill on pain medication and the muscle relaxers. CB#[phone]

## 2024-07-31 ENCOUNTER — Other Ambulatory Visit: Payer: Self-pay

## 2024-07-31 ENCOUNTER — Ambulatory Visit
Admission: RE | Admit: 2024-07-31 | Discharge: 2024-07-31 | Disposition: A | Source: Ambulatory Visit | Attending: Family Medicine | Admitting: Family Medicine

## 2024-07-31 ENCOUNTER — Other Ambulatory Visit: Payer: Self-pay | Admitting: Student

## 2024-07-31 DIAGNOSIS — M1711 Unilateral primary osteoarthritis, right knee: Secondary | ICD-10-CM

## 2024-07-31 DIAGNOSIS — E119 Type 2 diabetes mellitus without complications: Secondary | ICD-10-CM

## 2024-07-31 DIAGNOSIS — E1142 Type 2 diabetes mellitus with diabetic polyneuropathy: Secondary | ICD-10-CM

## 2024-07-31 DIAGNOSIS — M898X1 Other specified disorders of bone, shoulder: Secondary | ICD-10-CM

## 2024-08-08 ENCOUNTER — Other Ambulatory Visit: Payer: Self-pay

## 2024-08-08 DIAGNOSIS — K219 Gastro-esophageal reflux disease without esophagitis: Secondary | ICD-10-CM

## 2024-08-08 NOTE — Telephone Encounter (Signed)
 Medication sent to pharmacy

## 2024-08-09 ENCOUNTER — Ambulatory Visit: Payer: Self-pay | Admitting: Student

## 2024-08-17 ENCOUNTER — Encounter: Payer: Self-pay | Admitting: Orthopaedic Surgery

## 2024-08-17 ENCOUNTER — Ambulatory Visit (INDEPENDENT_AMBULATORY_CARE_PROVIDER_SITE_OTHER): Admitting: Orthopaedic Surgery

## 2024-08-17 ENCOUNTER — Telehealth: Payer: Self-pay | Admitting: *Deleted

## 2024-08-17 DIAGNOSIS — Z96651 Presence of right artificial knee joint: Secondary | ICD-10-CM

## 2024-08-17 NOTE — Telephone Encounter (Signed)
 Hme stated she talked to the pt and to [place] about the referral.

## 2024-08-17 NOTE — Progress Notes (Signed)
 "  Post-Op Visit Note   Patient: Jesus Martin           Date of Birth: 1961-02-23           MRN: [id number] Visit Date: 08/17/2024 PCP: Benuel Braun, DO   Assessment & Plan:  Chief Complaint:  Chief Complaint  Patient presents with   Right Knee - Follow-up    Right TKA 05/15/2024   Visit Diagnoses:  1. Status post total right knee replacement     Plan: History of Present Illness Jesus Martin is a 64 year old male, three months status post right total knee arthroplasty, who presents with persistent right knee pain.  Three months after right total knee replacement, he has significant spasmodic pain with stair ascent and descent, localized to the sides and above the right knee. He can use stairs but both directions are painful. He has no pain at rest.  He notes mild, persistent swelling and a sense of effusion in the right knee without any new injury or trauma since surgery.  He wants to return to backpacking, hiking, and camping but currently struggles with stairs under his own body weight and does not feel able to carry additional weight.  Physical Exam MUSCULOSKELETAL: Surgical scar healed, mild swelling and effusion in right knee.  Excellent functional range of motion.  Assessment and Plan Status post total right knee replacement with postoperative pain and weakness Three months post right total knee arthroplasty, he experiences persistent pain and weakness with stair use, attributed to residual muscle weakness from preoperative disuse and postoperative recovery. Full strength recovery is anticipated to take up to one year. Current symptoms are expected and should gradually improve with progressive activity. - Recommended gradual increase in activity as tolerated. - Advised against heavy backpacking or hiking with significant weight at this stage. - Discussed use of a neoprene compression knee sleeve for support during light activities; advised to purchase online if  desired. - Scheduled follow-up in three months to reassess progress.  Follow-Up Instructions: Return in about 3 months (around 11/15/2024).   Orders:  No orders of the defined types were placed in this encounter.  No orders of the defined types were placed in this encounter.   Imaging: No results found.  PMFS History: Patient Active Problem List   Diagnosis Date Noted   Status post total right knee replacement 05/15/2024   Pain of left scapula 03/07/2024   COPD exacerbation (HCC) 10/04/2023   Primary osteoarthritis of right knee 10/04/2023   Right knee pain 09/02/2023   Arthritis 05/18/2023   Preventative health care 12/23/2022   Perforation of tympanic membrane, left 09/28/2022   Need for shingles vaccine 09/08/2022   Otitis media follow-up, not resolved, left 09/01/2022   Otitis externa 09/01/2022   Impetigo 09/01/2022   Urinary frequency 06/10/2022   Memory loss 02/05/2022   Allergic rhinitis 12/09/2021   Cervical spondylosis 02/27/2021   Rash and nonspecific skin eruption 07/10/2020   Hyperthyroidism 04/25/2020   Tobacco use 04/16/2020   Multiple pulmonary nodules 04/16/2020   Compression fracture of spine (HCC) 01/30/2019   Hyperlipidemia 09/26/2018   Foot pain, bilateral 09/26/2018   Diabetic neuropathy (HCC) 12/22/2017   Chronic pain 11/24/2017   Hepatic steatosis 11/24/2017   COPD (chronic obstructive pulmonary disease) (HCC) 11/24/2017   Diabetes (HCC)    Bipolar 1 disorder (HCC) 04/17/2011   Hypertension 04/17/2011   Involuntary weight loss 04/17/2011   Past Medical History:  Diagnosis Date   Anxiety  Arthritis    Asthma    childhood asthma. no an issues as an adult   Bipolar I disorder [facility]) 1991   Dr. Vincente   Borderline hyperlipidemia    history of   COPD (chronic obstructive pulmonary disease) (HCC)    Diabetes mellitus, new onset (HCC) 12/22/2017   Fibromyalgia    GERD (gastroesophageal reflux disease)    History of kidney stones    HTN  (hypertension)    Hyperthyroidism    Lumbar vertebral fracture (HCC) 01/29/2019   L1   Pneumonia    Tobacco abuse    Viral conjunctivitis of both eyes 08/14/2022   Onset 2 days ago. Eyes are itchy and painful. Pt having tearing of both eyes.    Vitamin D  deficiency     Family History  Adopted: Yes    Past Surgical History:  Procedure Laterality Date   APPENDECTOMY     1975   BACK SURGERY     1998 - lumbar spine   CATARACT EXTRACTION Bilateral    COLONOSCOPY     INGUINAL HERNIA REPAIR Bilateral    as a child   NOSE SURGERY     as a child   TONSILLECTOMY     TOTAL KNEE ARTHROPLASTY Right 05/15/2024   Procedure: RIGHT TOTAL KNEE ARTHROPLASTY;  Surgeon: Jerri Kay HERO, MD;  Location: [facility] OR;  Service: Orthopedics;  Laterality: Right;   UMBILICAL HERNIA REPAIR     as a child   Social History   Occupational History   Occupation: disabled  Tobacco Use   Smoking status: Former    Current packs/day: 0.00    Average packs/day: 0.8 packs/day for 45.0 years (33.8 ttl pk-yrs)    Types: Cigarettes    Quit date: 10/01/2023    Years since quitting: 0.8   Smokeless tobacco: Current    Types: Snuff   Tobacco comments:    3/4 pack smoked daily [facility] 12/09/21    Patient and wife quit smoking 10/01/2023.  Vaping Use   Vaping status: Never Used  Substance and Sexual Activity   Alcohol  use: Not Currently    Comment: twice per year   Drug use: No   Sexual activity: Yes     "

## 2024-08-17 NOTE — Telephone Encounter (Signed)
 Copied from CRM #[id number]. Topic: Referral - Question >> Aug 17, 2024  8:54 AM Miquel SAILOR wrote: Reason for CRM: AMB referral to orthopedics (Order # [id number]) on 03/21/2024-PT request referral due to at the office now. Called and transferred to [facility]

## 2024-08-19 ENCOUNTER — Other Ambulatory Visit: Payer: Self-pay

## 2024-08-19 DIAGNOSIS — J449 Chronic obstructive pulmonary disease, unspecified: Secondary | ICD-10-CM

## 2024-08-21 NOTE — Telephone Encounter (Signed)
 Medication sent to pharmacy

## 2024-08-30 ENCOUNTER — Other Ambulatory Visit: Payer: Self-pay | Admitting: Student

## 2024-08-30 DIAGNOSIS — E119 Type 2 diabetes mellitus without complications: Secondary | ICD-10-CM

## 2024-08-30 DIAGNOSIS — E1142 Type 2 diabetes mellitus with diabetic polyneuropathy: Secondary | ICD-10-CM

## 2024-09-06 ENCOUNTER — Telehealth: Payer: Self-pay

## 2024-09-06 NOTE — Telephone Encounter (Signed)
 Called pt  to  complete  his COPD  PROJECT 1ST HALF  questions  [employer] number is disconnected home line no  answer but did leave a vmail to call back ... Please  direct  call to me ann .SABRA Thanks in advance

## 2024-11-14 ENCOUNTER — Ambulatory Visit: Admitting: Orthopaedic Surgery

## 2025-03-14 ENCOUNTER — Ambulatory Visit
